# Patient Record
Sex: Female | Born: 1980
Health system: Southern US, Community
[De-identification: ages and names within clinical notes are randomized; demographics above are authoritative.]

## PROBLEM LIST (undated history)

## (undated) DIAGNOSIS — J189 Pneumonia, unspecified organism: Secondary | ICD-10-CM

## (undated) DIAGNOSIS — K219 Gastro-esophageal reflux disease without esophagitis: Secondary | ICD-10-CM

## (undated) DIAGNOSIS — Z0184 Encounter for antibody response examination: Secondary | ICD-10-CM

## (undated) DIAGNOSIS — O039 Complete or unspecified spontaneous abortion without complication: Secondary | ICD-10-CM

## (undated) DIAGNOSIS — R768 Other specified abnormal immunological findings in serum: Secondary | ICD-10-CM

## (undated) DIAGNOSIS — E249 Cushing's syndrome, unspecified: Secondary | ICD-10-CM

## (undated) DIAGNOSIS — R87629 Unspecified abnormal cytological findings in specimens from vagina: Secondary | ICD-10-CM

## (undated) DIAGNOSIS — O1490 Unspecified pre-eclampsia, unspecified trimester: Secondary | ICD-10-CM

## (undated) DIAGNOSIS — I10 Essential (primary) hypertension: Secondary | ICD-10-CM

## (undated) DIAGNOSIS — K5792 Diverticulitis of intestine, part unspecified, without perforation or abscess without bleeding: Secondary | ICD-10-CM

## (undated) DIAGNOSIS — E669 Obesity, unspecified: Secondary | ICD-10-CM

## (undated) DIAGNOSIS — I2699 Other pulmonary embolism without acute cor pulmonale: Secondary | ICD-10-CM

## (undated) HISTORY — DX: Obesity, unspecified: E66.9

## (undated) HISTORY — DX: Unspecified abnormal cytological findings in specimens from vagina: R87.629

## (undated) HISTORY — DX: Complete or unspecified spontaneous abortion without complication: O03.9

## (undated) HISTORY — DX: Essential (primary) hypertension: I10

## (undated) HISTORY — DX: Unspecified pre-eclampsia, unspecified trimester: O14.90

## (undated) HISTORY — PX: CHOLECYSTECTOMY: SHX55

## (undated) HISTORY — DX: Cushing's syndrome, unspecified: E24.9

## (undated) HISTORY — PX: WISDOM TOOTH EXTRACTION: SHX21

---
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
## 2002-12-27 DIAGNOSIS — Z9049 Acquired absence of other specified parts of digestive tract: Secondary | ICD-10-CM | POA: Insufficient documentation

## 2010-10-17 DIAGNOSIS — O1490 Unspecified pre-eclampsia, unspecified trimester: Secondary | ICD-10-CM | POA: Insufficient documentation

## 2014-08-09 ENCOUNTER — Emergency Department (HOSPITAL_COMMUNITY): Payer: Self-pay

## 2014-08-09 ENCOUNTER — Inpatient Hospital Stay (HOSPITAL_COMMUNITY)
Admission: EM | Admit: 2014-08-09 | Discharge: 2014-08-10 | DRG: 392 | Disposition: A | Discharge status: Home / Self Care | Payer: Self-pay | Attending: Internal Medicine | Admitting: Internal Medicine

## 2014-08-09 ENCOUNTER — Encounter (HOSPITAL_COMMUNITY): Payer: Self-pay | Admitting: *Deleted

## 2014-08-09 DIAGNOSIS — K5732 Diverticulitis of large intestine without perforation or abscess without bleeding: Principal | ICD-10-CM | POA: Diagnosis present

## 2014-08-09 DIAGNOSIS — R109 Unspecified abdominal pain: Secondary | ICD-10-CM | POA: Diagnosis present

## 2014-08-09 DIAGNOSIS — D3501 Benign neoplasm of right adrenal gland: Secondary | ICD-10-CM | POA: Diagnosis present

## 2014-08-09 DIAGNOSIS — R740 Nonspecific elevation of levels of transaminase and lactic acid dehydrogenase [LDH]: Secondary | ICD-10-CM | POA: Diagnosis present

## 2014-08-09 DIAGNOSIS — Z9049 Acquired absence of other specified parts of digestive tract: Secondary | ICD-10-CM | POA: Diagnosis present

## 2014-08-09 DIAGNOSIS — K5792 Diverticulitis of intestine, part unspecified, without perforation or abscess without bleeding: Secondary | ICD-10-CM | POA: Diagnosis present

## 2014-08-09 DIAGNOSIS — R7401 Elevation of levels of liver transaminase levels: Secondary | ICD-10-CM | POA: Diagnosis present

## 2014-08-09 LAB — COMPREHENSIVE METABOLIC PANEL
ALT: 70 U/L — AB (ref 0–35)
AST: 69 U/L — ABNORMAL HIGH (ref 0–37)
Albumin: 4.1 g/dL (ref 3.5–5.2)
Alkaline Phosphatase: 91 U/L (ref 39–117)
Anion gap: 7 (ref 5–15)
BUN: 12 mg/dL (ref 6–23)
CO2: 23 mmol/L (ref 19–32)
Calcium: 8.6 mg/dL (ref 8.4–10.5)
Chloride: 108 mmol/L (ref 96–112)
Creatinine, Ser: 0.75 mg/dL (ref 0.50–1.10)
GFR calc non Af Amer: >90 mL/min (ref >90)
GLUCOSE: 93 mg/dL (ref 70–99)
Potassium: 3.5 mmol/L (ref 3.5–5.1)
Sodium: 138 mmol/L (ref 135–145)
Total Bilirubin: 0.6 mg/dL (ref 0.3–1.2)
Total Protein: 7.8 g/dL (ref 6.0–8.3)

## 2014-08-09 LAB — CBC WITH DIFFERENTIAL/PLATELET
BASOS PCT: 0 % (ref 0–1)
Basophils Absolute: 0 10*3/uL (ref 0.0–0.1)
EOS ABS: 0.1 10*3/uL (ref 0.0–0.7)
Eosinophils Relative: 1 % (ref 0–5)
HEMATOCRIT: 39.7 % (ref 36.0–46.0)
HEMOGLOBIN: 13.1 g/dL (ref 12.0–15.0)
Lymphocytes Relative: 21 % (ref 12–46)
Lymphs Abs: 2.5 10*3/uL (ref 0.7–4.0)
MCH: 30 pg (ref 26.0–34.0)
MCHC: 33 g/dL (ref 30.0–36.0)
MCV: 91.1 fL (ref 78.0–100.0)
MONO ABS: 0.8 10*3/uL (ref 0.1–1.0)
MONOS PCT: 6 % (ref 3–12)
NEUTROS PCT: 72 % (ref 43–77)
Neutro Abs: 8.5 10*3/uL — ABNORMAL HIGH (ref 1.7–7.7)
Platelets: 284 10*3/uL (ref 150–400)
RBC: 4.36 MIL/uL (ref 3.87–5.11)
RDW: 13.5 % (ref 11.5–15.5)
WBC: 11.9 10*3/uL — ABNORMAL HIGH (ref 4.0–10.5)

## 2014-08-09 LAB — URINALYSIS, ROUTINE W REFLEX MICROSCOPIC
Bilirubin Urine: NEGATIVE
Glucose, UA: NEGATIVE mg/dL
Hgb urine dipstick: NEGATIVE
Ketones, ur: NEGATIVE mg/dL
Nitrite: NEGATIVE
PH: 5 (ref 5.0–8.0)
PROTEIN: NEGATIVE mg/dL
Specific Gravity, Urine: 1.022 (ref 1.005–1.030)
Urobilinogen, UA: 1 mg/dL (ref 0.0–1.0)

## 2014-08-09 LAB — URINE MICROSCOPIC-ADD ON

## 2014-08-09 LAB — PREGNANCY, URINE: Preg Test, Ur: NEGATIVE

## 2014-08-09 LAB — LACTIC ACID, PLASMA: Lactic Acid, Venous: 2.6 mmol/L (ref 0.5–2.0)

## 2014-08-09 LAB — LIPASE, BLOOD: Lipase: 23 U/L (ref 11–59)

## 2014-08-09 LAB — PROTIME-INR
INR: 1.07 (ref 0.00–1.49)
PROTHROMBIN TIME: 14 s (ref 11.6–15.2)

## 2014-08-09 IMAGING — CT CT ABD-PELV W/ CM
1 of 2 series · 15 of 32 positions shown, 19 images · IV contrast (OMNIPAQUE 300)
Comparison: None.

CLINICAL DATA: Patient with intermittent left lower quadrant pain.

EXAM:
CT ABDOMEN AND PELVIS WITH CONTRAST
TECHNIQUE: Multidetector CT imaging of the abdomen and pelvis was performed
using the standard protocol following bolus administration of
intravenous contrast.
CONTRAST:  50mL OMNIPAQUE IOHEXOL 300 MG/ML SOLN, 100mL OMNIPAQUE
IOHEXOL 300 MG/ML SOLN

[Series 2: abd/pel with · axial · 0.80mm/px · z∈[+1054,+1508]mm · 15 of 99 slices shown, 19 images]
[im 4/99  soft-tissue]
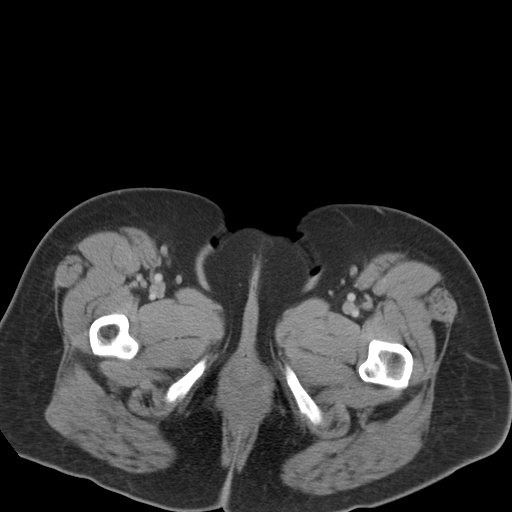
[im 4/99  bone]
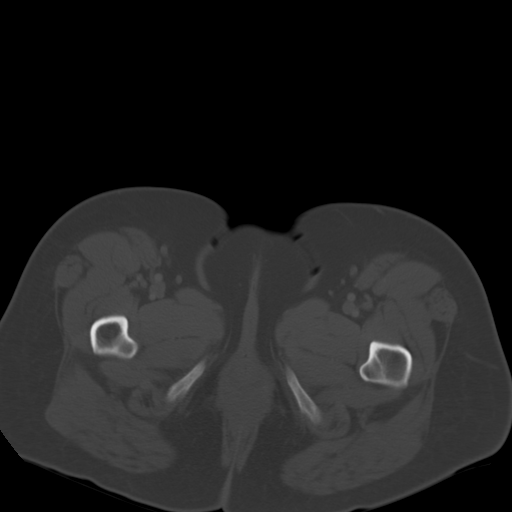
[im 12/99  soft-tissue]
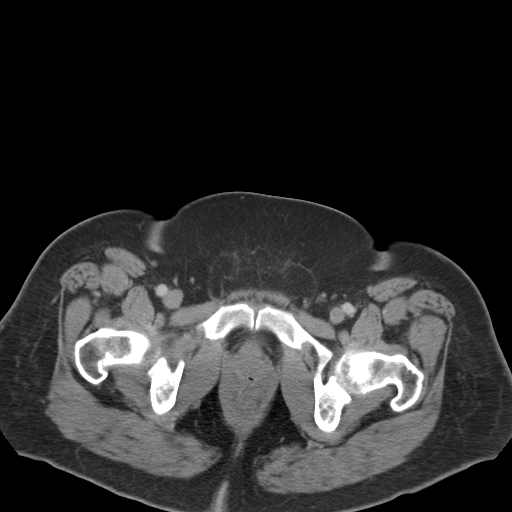
[im 20/99  soft-tissue]
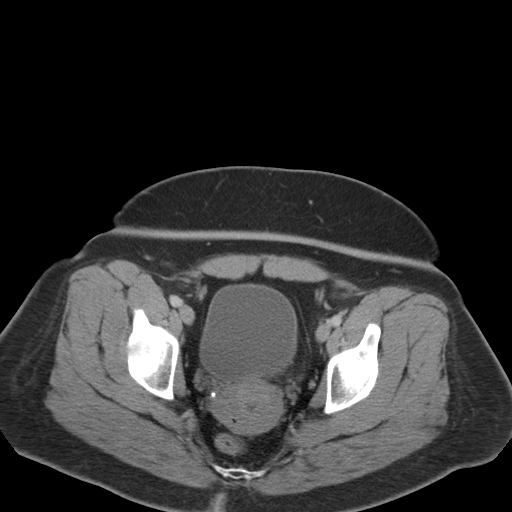
[im 28/99  soft-tissue]
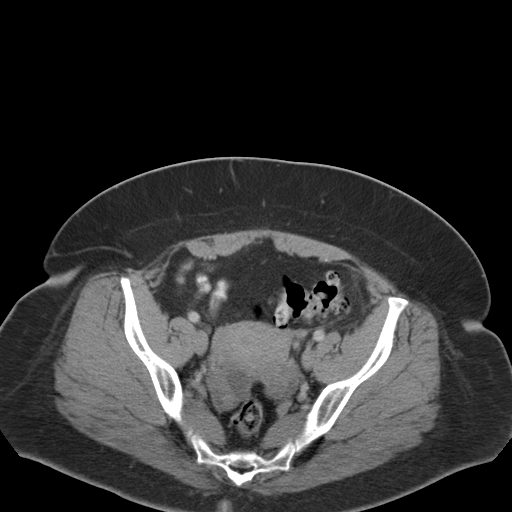
[im 36/99  soft-tissue]
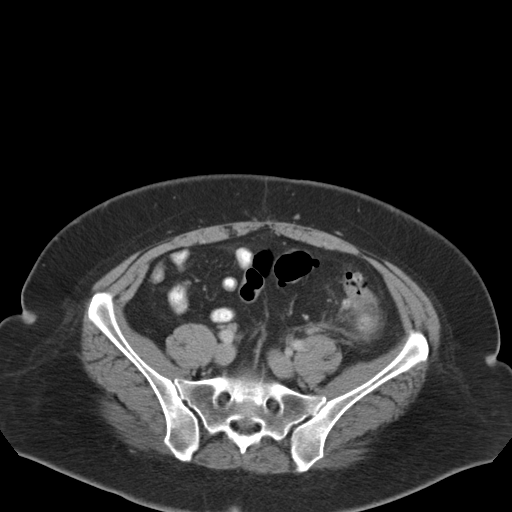
[im 44/99  soft-tissue]
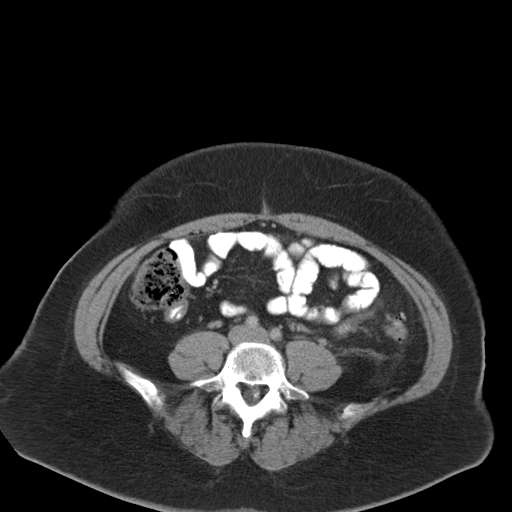
[im 51/99  soft-tissue]
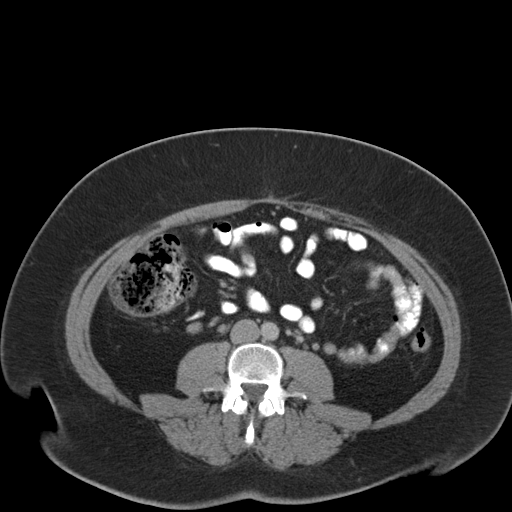
[im 55/99  soft-tissue]
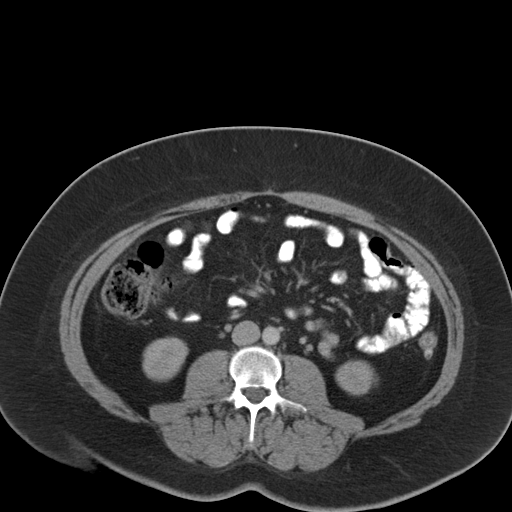
[im 63/99  soft-tissue]
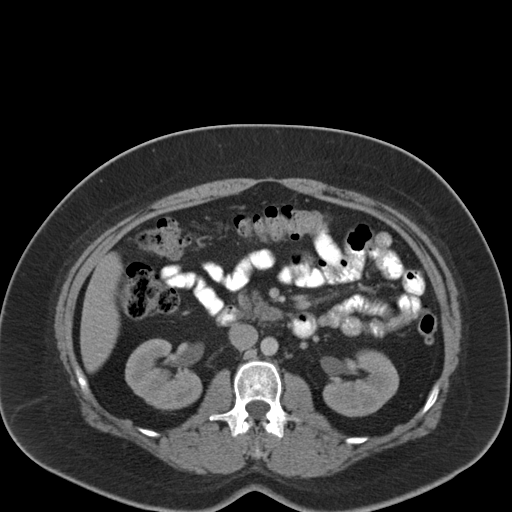
[im 63/99  bone]
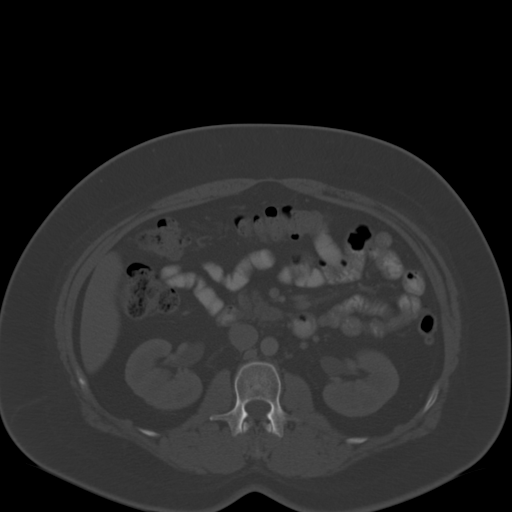
[im 71/99  soft-tissue]
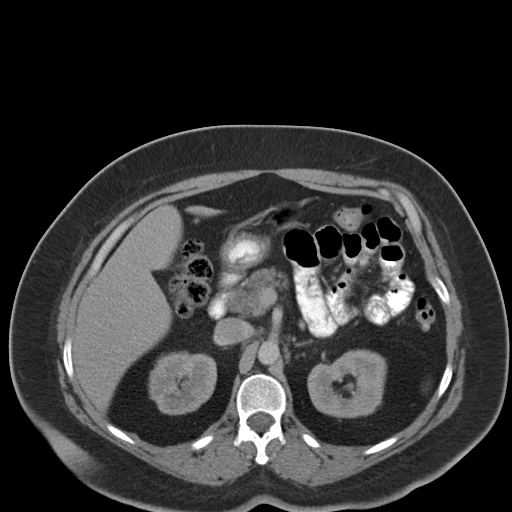
[im 79/99  soft-tissue]
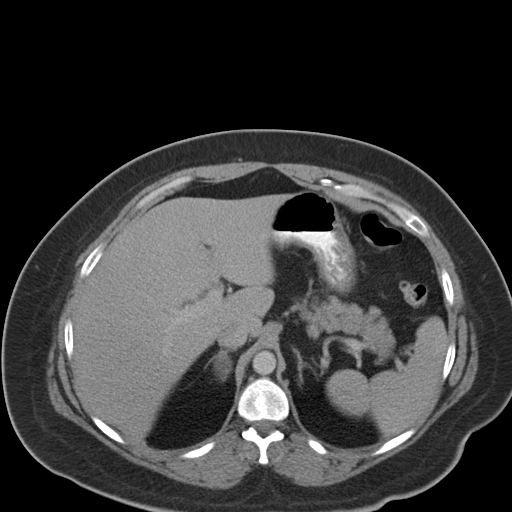
[im 83/99  lung]
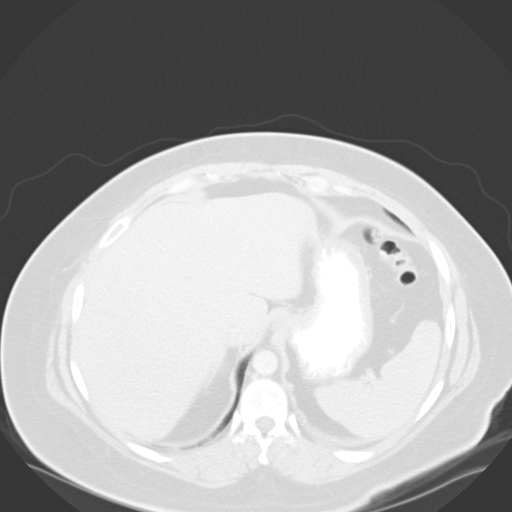
[im 87/99  soft-tissue]
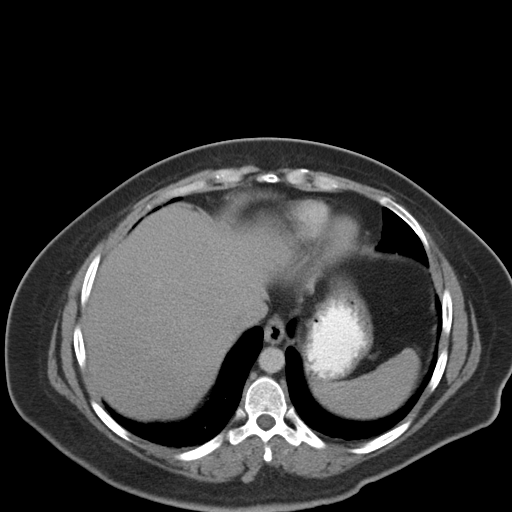
[im 87/99  lung]
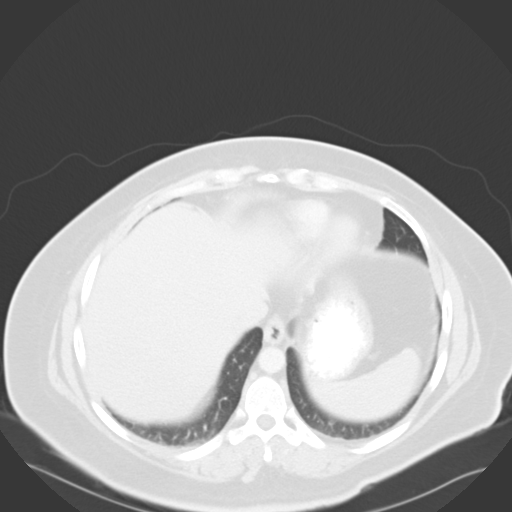
[im 91/99  lung]
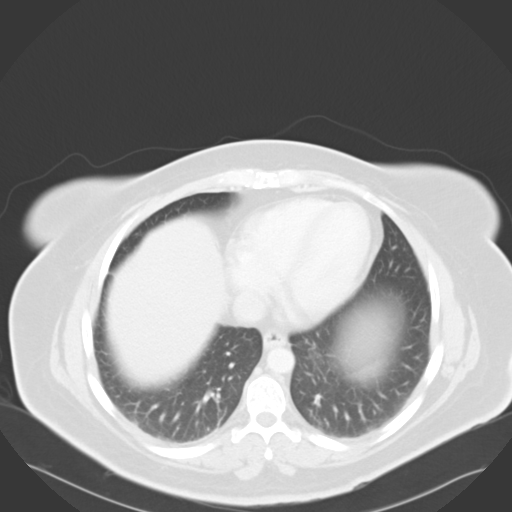
[im 95/99  soft-tissue]
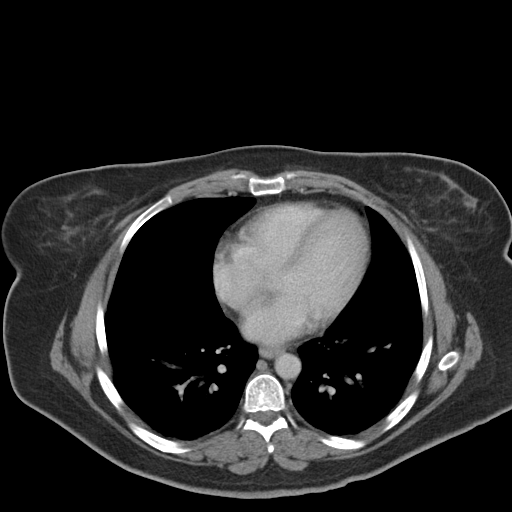
[im 95/99  lung]
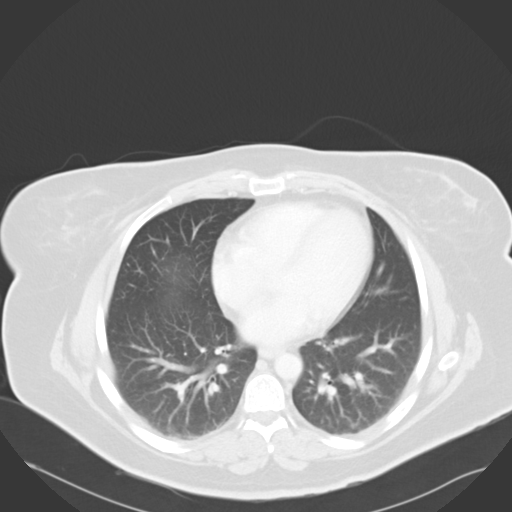

[15 of 32 positions shown; findings below may reference images not displayed]

FINDINGS: Lower chest: Dependent atelectasis within bilateral lower lobes
normal heart size. No pericardial effusion.

Hepatobiliary: The liver is normal in size and contour. Status post
cholecystectomy. No intrahepatic or extrahepatic biliary ductal
dilatation.

Pancreas: Unremarkable

Spleen: Unremarkable

Adrenals/Urinary Tract: 2.7 cm low-density mass within the right
adrenal gland with an internal density of -1 Hounsfield units (image
19; series 2), compatible with adrenal adenoma. Kidneys enhance
symmetrically with contrast. No hydronephrosis. Urinary bladder is
unremarkable.

Stomach/Bowel: Sigmoid colonic diverticulosis. There is focal wall
thickening and inflammatory change involving the descending colon
(image 62; series 2), compatible with acute diverticulitis. The
appendix is normal. No small bowel obstruction.

Vascular/Lymphatic: Normal caliber abdominal aorta. No
retroperitoneal lymphadenopathy.

Other: Uterus and adnexal structures are unremarkable.

Musculoskeletal: Lower lumbar spine degenerative changes. No
aggressive or acute appearing osseous lesions.
IMPRESSION: Findings compatible with acute descending/sigmoid colonic
diverticulitis. Consider correlation with colonoscopy upon
resolution of the acute symptomatology.

Right adrenal adenoma.

## 2014-08-09 MED ORDER — IOHEXOL 300 MG/ML  SOLN
100.0000 mL | Freq: Once | INTRAMUSCULAR | Status: AC | PRN
  Administered 2014-08-09: 100 mL via INTRAVENOUS

## 2014-08-09 MED ORDER — SODIUM CHLORIDE 0.9 % IV BOLUS (SEPSIS)
1000.0000 mL | Freq: Once | INTRAVENOUS | Status: AC
  Administered 2014-08-09: 1000 mL via INTRAVENOUS

## 2014-08-09 MED ORDER — CIPROFLOXACIN IN D5W 400 MG/200ML IV SOLN
400.0000 mg | Freq: Once | INTRAVENOUS | Status: AC
  Administered 2014-08-09: 400 mg via INTRAVENOUS
  Filled 2014-08-09: qty 200

## 2014-08-09 MED ORDER — PIPERACILLIN-TAZOBACTAM 3.375 G IVPB
3.3750 g | Freq: Three times a day (TID) | INTRAVENOUS | Status: DC
  Administered 2014-08-09 – 2014-08-10 (×2): 3.375 g via INTRAVENOUS
  Filled 2014-08-09 (×3): qty 50

## 2014-08-09 MED ORDER — HEPARIN SODIUM (PORCINE) 5000 UNIT/ML IJ SOLN
5000.0000 [IU] | Freq: Three times a day (TID) | INTRAMUSCULAR | Status: DC
  Administered 2014-08-10: 5000 [IU] via SUBCUTANEOUS
  Filled 2014-08-09 (×4): qty 1

## 2014-08-09 MED ORDER — SODIUM CHLORIDE 0.9 % IV SOLN
1000.0000 mL | Freq: Once | INTRAVENOUS | Status: AC
  Administered 2014-08-09: 1000 mL via INTRAVENOUS

## 2014-08-09 MED ORDER — ONDANSETRON HCL 4 MG/2ML IJ SOLN
4.0000 mg | Freq: Three times a day (TID) | INTRAMUSCULAR | Status: DC | PRN
  Administered 2014-08-10: 4 mg via INTRAVENOUS
  Filled 2014-08-09: qty 2

## 2014-08-09 MED ORDER — KETOROLAC TROMETHAMINE 30 MG/ML IJ SOLN
30.0000 mg | Freq: Once | INTRAMUSCULAR | Status: AC
  Administered 2014-08-09: 30 mg via INTRAVENOUS
  Filled 2014-08-09: qty 1

## 2014-08-09 MED ORDER — ONDANSETRON HCL 4 MG/2ML IJ SOLN
4.0000 mg | Freq: Once | INTRAMUSCULAR | Status: AC
  Administered 2014-08-09: 4 mg via INTRAVENOUS
  Filled 2014-08-09: qty 2

## 2014-08-09 MED ORDER — IOHEXOL 300 MG/ML  SOLN
50.0000 mL | Freq: Once | INTRAMUSCULAR | Status: AC | PRN
  Administered 2014-08-09: 50 mL via ORAL

## 2014-08-09 MED ORDER — MORPHINE SULFATE 4 MG/ML IJ SOLN
4.0000 mg | Freq: Once | INTRAMUSCULAR | Status: AC
  Administered 2014-08-09: 4 mg via INTRAVENOUS
  Filled 2014-08-09: qty 1

## 2014-08-09 MED ORDER — PROMETHAZINE HCL 25 MG/ML IJ SOLN
25.0000 mg | Freq: Once | INTRAMUSCULAR | Status: AC
  Administered 2014-08-09: 25 mg via INTRAVENOUS
  Filled 2014-08-09: qty 1

## 2014-08-09 MED ORDER — PHENTERMINE HCL 37.5 MG PO CAPS: 37.5000 mg | ORAL_CAPSULE | ORAL | Status: DC

## 2014-08-09 MED ORDER — SODIUM CHLORIDE 0.9 % IV SOLN
INTRAVENOUS | Status: DC
  Administered 2014-08-09: 22:00:00 via INTRAVENOUS

## 2014-08-09 MED ORDER — MORPHINE SULFATE 2 MG/ML IJ SOLN: 2.0000 mg | INTRAMUSCULAR | Status: DC | PRN

## 2014-08-09 NOTE — H&P (Signed)
[facility] History and Physical  Paula King FIE:[id number] DOB: Nov 09, 1980 DOA: 08/09/2014  Referring physician: ED physician PCP: No primary care provider on file.  Specialists:   Chief Complaint: Abdominal pain  HPI: Paula King is a 34 y.o. female without significant past medical history, who presents with abdominal pain.  Patient reports that she started having abdominal pain 5 days ago. The pain is located in the left lower quadrant, constant, 8 out of 10 in severity, sharp, radiating to the left groin area. It is aggravated by movement. It is not alleviated by any known factors. Patient does not have fever, but has chills. Patient has some nausea, but has no vomiting or diarrhea.  Patient does not have vaginal discharge. Her menstrual periods was regular. Last menstrual period was on 07/28/14. Patient dose not have headaches, cough, chest pain, SOB, dysuria, urgency, frequency, hematuria, skin rashes, joint pain or leg swelling. No unilateral weakness, numbness or tingling sensations. No vision change or hearing loss.  In ED, patient was found to have negative lipase, negative urinalysis, elevated transaminase with AST 69, ALT 70 and normal total bilirubin. Normal temperature. Leukocytosis with WBC 11.9. CT abdomen-pelvis showed diverticulitis in descending and sigmoid colon. She was accidentally found to have right adrenal adenoma by CT scan. Patient is admitted to inpatient for further evaluation and treatment.  Of note, patient was started with IV Cipro, but she started sweating and became clammy. IV ciprofloxacin was then discontinued. Patient reports that she took oral ciprofloxacin in the past without any problem.  Review of Systems: As presented in the history of presenting illness, rest negative.  Where does patient live?  At home Can patient participate in ADLs? Yes  Allergy: No Known Allergies  History reviewed. No pertinent past medical history.  Past Surgical  History  Procedure Laterality Date  . Cholecystectomy    . Cesarean section      Social History:  reports that she has never smoked. She does not have any smokeless tobacco history on file. She reports that she does not drink alcohol or use illicit drugs.  Family History: History reviewed. No pertinent family history.   Prior to Admission medications   Medication Sig Start Date End Date Taking? Authorizing Provider  bismuth subsalicylate (PEPTO BISMOL) 262 MG/15ML suspension Take 30 mLs by mouth every 6 (six) hours as needed for indigestion.   Yes Historical Provider, MD  ibuprofen (ADVIL,MOTRIN) 200 MG tablet Take 800 mg by mouth every 6 (six) hours as needed for moderate pain.   Yes Historical Provider, MD  loratadine (CLARITIN) 10 MG tablet Take 10 mg by mouth daily.   Yes Historical Provider, MD  phentermine 37.5 MG capsule Take 37.5 mg by mouth every morning.   Yes Historical Provider, MD    Physical Exam: Filed Vitals:   08/09/14 1318 08/09/14 1806 08/09/14 2021  BP: 126/91 134/95 123/87  Pulse: 128 87 80  Temp: 98.4 F (36.9 C) 98.2 F (36.8 C)   TempSrc: Oral Oral   Resp:  17 18  SpO2: 98% 100% 100%   General: Not in acute distress HEENT:       Eyes: PERRL, EOMI, no scleral icterus       ENT: No discharge from the ears and nose, no pharynx injection, no tonsillar enlargement.        Neck: No JVD, no bruit, no mass felt. Cardiac: S1/S2, RRR, No murmurs, No gallops or rubs Pulm: Good air movement bilaterally. Clear to auscultation bilaterally. No rales, wheezing,  rhonchi or rubs. Abd: Soft, nondistended, tenderness over LLQ, no rebound pain, no organomegaly, BS present Ext: No edema bilaterally. 2+DP/PT pulse bilaterally Musculoskeletal: No joint deformities, erythema, or stiffness, ROM full Skin: No rashes.  Neuro: Alert and oriented X3, cranial nerves II-XII grossly intact, muscle strength 5/5 in all extremeties, sensation to light touch intact.  Psych: Patient is  not psychotic, no suicidal or hemocidal ideation.  Labs on Admission:  Basic Metabolic Panel:  Recent Labs Lab 08/09/14 1421  NA 138  K 3.5  CL 108  CO2 23  GLUCOSE 93  BUN 12  CREATININE 0.75  CALCIUM 8.6   Liver Function Tests:  Recent Labs Lab 08/09/14 1421  AST 69*  ALT 70*  ALKPHOS 91  BILITOT 0.6  PROT 7.8  ALBUMIN 4.1    Recent Labs Lab 08/09/14 1421  LIPASE 23   No results for input(s): AMMONIA in the last 168 hours. CBC:  Recent Labs Lab 08/09/14 1421  WBC 11.9*  NEUTROABS 8.5*  HGB 13.1  HCT 39.7  MCV 91.1  PLT 284   Cardiac Enzymes: No results for input(s): CKTOTAL, CKMB, CKMBINDEX, TROPONINI in the last 168 hours.  BNP (last 3 results) No results for input(s): BNP in the last 8760 hours.  ProBNP (last 3 results) No results for input(s): PROBNP in the last 8760 hours.  CBG: No results for input(s): GLUCAP in the last 168 hours.  Radiological Exams on Admission: Ct Abdomen Pelvis W Contrast  08/09/2014   CLINICAL DATA:  Patient with intermittent left lower quadrant pain.  EXAM: CT ABDOMEN AND PELVIS WITH CONTRAST  TECHNIQUE: Multidetector CT imaging of the abdomen and pelvis was performed using the standard protocol following bolus administration of intravenous contrast.  CONTRAST:  64mL OMNIPAQUE IOHEXOL 300 MG/ML SOLN, 149mL OMNIPAQUE IOHEXOL 300 MG/ML SOLN  COMPARISON:  None.  FINDINGS: Lower chest: Dependent atelectasis within bilateral lower lobes normal heart size. No pericardial effusion.  Hepatobiliary: The liver is normal in size and contour. Status post cholecystectomy. No intrahepatic or extrahepatic biliary ductal dilatation.  Pancreas: Unremarkable  Spleen: Unremarkable  Adrenals/Urinary Tract: 2.7 cm low-density mass within the right adrenal gland with an internal density of -1 Hounsfield units (image 19; series 2), compatible with adrenal adenoma. Kidneys enhance symmetrically with contrast. No hydronephrosis. Urinary bladder is  unremarkable.  Stomach/Bowel: Sigmoid colonic diverticulosis. There is focal wall thickening and inflammatory change involving the descending colon (image 62; series 2), compatible with acute diverticulitis. The appendix is normal. No small bowel obstruction.  Vascular/Lymphatic: Normal caliber abdominal aorta. No retroperitoneal lymphadenopathy.  Other: Uterus and adnexal structures are unremarkable.  Musculoskeletal: Lower lumbar spine degenerative changes. No aggressive or acute appearing osseous lesions.  IMPRESSION: Findings compatible with acute descending/sigmoid colonic diverticulitis. Consider correlation with colonoscopy upon resolution of the acute symptomatology.  Right adrenal adenoma.   Electronically Signed   By: Lovey Newcomer M.D.   On: 08/09/2014 17:48    Assessment/Plan Principal Problem:   Diverticulitis of colon Active Problems:   Abdominal pain   Adrenal adenoma   Diverticulitis   Elevated transaminase level  Diverticulitis of colon: As evidenced by CT scan. No abscess or bleeding. Patient is hemodynamically stable, not obviously septic. Since patient has severe nausea, and cannot tolerate oral medications, will be admitted to inpatient for IV antibiotics treatment. -will admit to med-surg -start IV zosyn -follow up blood culture -HIV ab - symptomatic control: Zofran for nausea, morphine for abdominal pain -Check lactic acid level -IVF: NS 125cc/h  Mildly Elevated transaminase level: Etiology is not clear. Likely due to severe nausea. -check hepatitis panel  Accidental adrenal adenoma: -Follow-up with PCP   DVT ppx: SQ Heparin  Code Status: Full code Family Communication: None at bed side.            Disposition Plan: Admit to inpatient   Date of Service 08/09/2014    Ivor Costa [facility] Pager [phone]  If 7PM-7AM, please contact night-coverage [email or web address] Password [facility] 08/09/2014, 8:49 PM

## 2014-08-09 NOTE — ED Notes (Signed)
Patient states that since Monday she has been having sharp intermittent pain to her LLQ. She states it is tender to touch. Denies diarrhea and emesis, but has had some nausea. Pain has been unrelieved with ibuprofen and pepto bismol.

## 2014-08-09 NOTE — Progress Notes (Signed)
ANTIBIOTIC CONSULT NOTE - INITIAL  Pharmacy Consult for Zosyn Indication: intra-abdominal infection  No Known Allergies  Patient Measurements: Height: 5\' 4"  (162.6 cm) Weight: 227 lb 1.6 oz (103.012 kg) IBW/kg (Calculated) : 54.7 Adjusted Body Weight: 74kg  Vital Signs: Temp: 97.6 F (36.4 C) (02/12 2125) Temp Source: Oral (02/12 2125) BP: 123/66 mmHg (02/12 2125) Pulse Rate: 74 (02/12 2125)  Labs:  Recent Labs  08/09/14 1421  WBC 11.9*  HGB 13.1  PLT 284  CREATININE 0.75   Estimated Creatinine Clearance: 116.8 mL/min (by C-G formula based on Cr of 0.75).   Assessment: 57 yoF admitted 2/12 with LLQ abdominal pain x5 days PTA. Transaminases are slightly elevated on admission. CT abdomen-pelvis showed diverticulitis in descending and sigmoid colon. Patient originally started on ciprofloxacin per MD but patient experienced diaphoresis and clamminess with IV ciprofloxacin administration. Pharmacy is now consulted to change antibiotics to Zosyn for intra-abdominal infection.  Antiinfectives  2/12 >> ciprofloxacin x1 2/12 >> Zosyn >>    Labs / vitals Tmax: afebrile WBCs: slightly elevated to 11.9 Renal: SCr 0.75 (baseline unknown), Crcl > 100 ml/min CG (obese), >100N  Microbiology 2/12 blood x2: ordered 2/12 urine: IP  2/12 acute hepatitis panel: ordered 2/12 HIV Ab: ordered   Goal of Therapy:  Zosyn per indication and renal function  Plan:  - start Zosyn 3.375G IV q8h, each dose to be infused over 4 hours - follow-up clinical course, culture results, renal function - follow-up antibiotic de-escalation and length of therapy  Thank you for the consult.  Currie Paris, PharmD, BCPS Pager: [phone] Pharmacy: [phone] 08/09/2014 9:56 PM

## 2014-08-09 NOTE — ED Provider Notes (Signed)
CSN: [id number]     Arrival date & time 08/09/14  1317 History   First MD Initiated Contact with Patient 08/09/14 1501     Chief Complaint  Patient presents with  . Abdominal Pain     (Consider location/radiation/quality/duration/timing/severity/associated sxs/prior Treatment) HPI Patient presents to the emergency department with left-sided lower abdominal pain that started 5 days ago.  The patient states that she has had worsening abdominal pain over that time.  The patient states that movement and palpation make the pain worse.  She states that ibuprofen helped somewhat with the pain, but did not totally relieve her symptoms.  Patient states that she has never had any ovarian cyst in the past.  The patient denies chest pain, shortness of breath, dysuria, incontinence, weakness, dizziness, headache, blurred vision, fever, vaginal bleeding, vaginal discharge, diarrhea, vomiting or syncope.  The patient states that she has had some nausea associated with the pain. History reviewed. No pertinent past medical history. Past Surgical History  Procedure Laterality Date  . Cholecystectomy    . Cesarean section     History reviewed. No pertinent family history. History  Substance Use Topics  . Smoking status: Never Smoker   . Smokeless tobacco: Not on file  . Alcohol Use: No   OB History    No data available     Review of Systems  All other systems negative except as documented in the HPI. All pertinent positives and negatives as reviewed in the HPI.  Allergies  Review of patient's allergies indicates no known allergies.  Home Medications   Prior to Admission medications   Medication Sig Start Date End Date Taking? Authorizing Provider  bismuth subsalicylate (PEPTO BISMOL) 262 MG/15ML suspension Take 30 mLs by mouth every 6 (six) hours as needed for indigestion.   Yes Historical Provider, MD  ibuprofen (ADVIL,MOTRIN) 200 MG tablet Take 800 mg by mouth every 6 (six) hours as needed  for moderate pain.   Yes Historical Provider, MD  loratadine (CLARITIN) 10 MG tablet Take 10 mg by mouth daily.   Yes Historical Provider, MD  phentermine 37.5 MG capsule Take 37.5 mg by mouth every morning.   Yes Historical Provider, MD   BP 126/91 mmHg  Pulse 128  Temp(Src) 98.4 F (36.9 C) (Oral)  SpO2 98%  LMP 07/28/2014 Physical Exam  Constitutional: She is oriented to person, place, and time. She appears well-developed and well-nourished. No distress.  HENT:  Head: Normocephalic and atraumatic.  Mouth/Throat: Oropharynx is clear and moist.  Eyes: Pupils are equal, round, and reactive to light.  Neck: Normal range of motion. Neck supple.  Cardiovascular: Normal rate, regular rhythm and normal heart sounds.  Exam reveals no gallop and no friction rub.   No murmur heard. Pulmonary/Chest: Effort normal and breath sounds normal. No respiratory distress.  Abdominal: Soft. Normal appearance and bowel sounds are normal. She exhibits no distension. There is tenderness. There is no rigidity, no rebound and no guarding.    Musculoskeletal: She exhibits no edema.  Neurological: She is alert and oriented to person, place, and time. She exhibits normal muscle tone. Coordination normal.  Skin: Skin is warm and dry. No rash noted. No erythema.  Nursing note and vitals reviewed.   ED Course  Procedures (including critical care time) Labs Review Labs Reviewed  CBC WITH DIFFERENTIAL/PLATELET - Abnormal; Notable for the following:    WBC 11.9 (*)    Neutro Abs 8.5 (*)    All other components within normal limits  COMPREHENSIVE  METABOLIC PANEL - Abnormal; Notable for the following:    AST 69 (*)    ALT 70 (*)    All other components within normal limits  URINALYSIS, ROUTINE W REFLEX MICROSCOPIC - Abnormal; Notable for the following:    APPearance CLOUDY (*)    Leukocytes, UA SMALL (*)    All other components within normal limits  LIPASE, BLOOD  PREGNANCY, URINE  URINE MICROSCOPIC-ADD  ON    Imaging Review Ct Abdomen Pelvis W Contrast  08/09/2014   CLINICAL DATA:  Patient with intermittent left lower quadrant pain.  EXAM: CT ABDOMEN AND PELVIS WITH CONTRAST  TECHNIQUE: Multidetector CT imaging of the abdomen and pelvis was performed using the standard protocol following bolus administration of intravenous contrast.  CONTRAST:  61mL OMNIPAQUE IOHEXOL 300 MG/ML SOLN, 139mL OMNIPAQUE IOHEXOL 300 MG/ML SOLN  COMPARISON:  None.  FINDINGS: Lower chest: Dependent atelectasis within bilateral lower lobes normal heart size. No pericardial effusion.  Hepatobiliary: The liver is normal in size and contour. Status post cholecystectomy. No intrahepatic or extrahepatic biliary ductal dilatation.  Pancreas: Unremarkable  Spleen: Unremarkable  Adrenals/Urinary Tract: 2.7 cm low-density mass within the right adrenal gland with an internal density of -1 Hounsfield units (image 19; series 2), compatible with adrenal adenoma. Kidneys enhance symmetrically with contrast. No hydronephrosis. Urinary bladder is unremarkable.  Stomach/Bowel: Sigmoid colonic diverticulosis. There is focal wall thickening and inflammatory change involving the descending colon (image 62; series 2), compatible with acute diverticulitis. The appendix is normal. No small bowel obstruction.  Vascular/Lymphatic: Normal caliber abdominal aorta. No retroperitoneal lymphadenopathy.  Other: Uterus and adnexal structures are unremarkable.  Musculoskeletal: Lower lumbar spine degenerative changes. No aggressive or acute appearing osseous lesions.  IMPRESSION: Findings compatible with acute descending/sigmoid colonic diverticulitis. Consider correlation with colonoscopy upon resolution of the acute symptomatology.  Right adrenal adenoma.   Electronically Signed   By: Lovey Newcomer M.D.   On: 08/09/2014 17:48    Patient needed admission to the hospital because she is feeling worsening pain with nausea and diaphoresis.  The patient otherwise is been  stable.  I spoke with the [facility] who will admit her for further evaluation MDM   Final diagnoses:  None       Brent General, PA-C 08/10/14 2212  Leota Jacobsen, MD 08/11/14 [phone]

## 2014-08-10 DIAGNOSIS — R740 Nonspecific elevation of levels of transaminase and lactic acid dehydrogenase [LDH]: Secondary | ICD-10-CM

## 2014-08-10 DIAGNOSIS — D3501 Benign neoplasm of right adrenal gland: Secondary | ICD-10-CM

## 2014-08-10 DIAGNOSIS — R1032 Left lower quadrant pain: Secondary | ICD-10-CM

## 2014-08-10 DIAGNOSIS — K5732 Diverticulitis of large intestine without perforation or abscess without bleeding: Principal | ICD-10-CM

## 2014-08-10 LAB — CBC
HCT: 34.9 % — ABNORMAL LOW (ref 36.0–46.0)
Hemoglobin: 11.5 g/dL — ABNORMAL LOW (ref 12.0–15.0)
MCH: 29.8 pg (ref 26.0–34.0)
MCHC: 33 g/dL (ref 30.0–36.0)
MCV: 90.4 fL (ref 78.0–100.0)
Platelets: 253 10*3/uL (ref 150–400)
RBC: 3.86 MIL/uL — ABNORMAL LOW (ref 3.87–5.11)
RDW: 13.5 % (ref 11.5–15.5)
WBC: 10.4 10*3/uL (ref 4.0–10.5)

## 2014-08-10 LAB — COMPREHENSIVE METABOLIC PANEL
ALK PHOS: 77 U/L (ref 39–117)
ALT: 55 U/L — ABNORMAL HIGH (ref 0–35)
ANION GAP: 4 — AB (ref 5–15)
AST: 49 U/L — ABNORMAL HIGH (ref 0–37)
Albumin: 3.1 g/dL — ABNORMAL LOW (ref 3.5–5.2)
BUN: 10 mg/dL (ref 6–23)
CHLORIDE: 109 mmol/L (ref 96–112)
CO2: 26 mmol/L (ref 19–32)
CREATININE: 0.73 mg/dL (ref 0.50–1.10)
Calcium: 8 mg/dL — ABNORMAL LOW (ref 8.4–10.5)
GLUCOSE: 132 mg/dL — AB (ref 70–99)
POTASSIUM: 3.9 mmol/L (ref 3.5–5.1)
Sodium: 139 mmol/L (ref 135–145)
Total Bilirubin: 0.6 mg/dL (ref 0.3–1.2)
Total Protein: 6.2 g/dL (ref 6.0–8.3)

## 2014-08-10 LAB — GLUCOSE, CAPILLARY: GLUCOSE-CAPILLARY: 111 mg/dL — AB (ref 70–99)

## 2014-08-10 LAB — LACTIC ACID, PLASMA: LACTIC ACID, VENOUS: 1.1 mmol/L (ref 0.5–2.0)

## 2014-08-10 MED ORDER — METRONIDAZOLE 500 MG PO TABS: 500.0000 mg | ORAL_TABLET | Freq: Three times a day (TID) | ORAL | Status: DC

## 2014-08-10 MED ORDER — ONDANSETRON 4 MG PO TBDP: 4.0000 mg | ORAL_TABLET | Freq: Three times a day (TID) | ORAL | Status: DC | PRN

## 2014-08-10 MED ORDER — KETOROLAC TROMETHAMINE 30 MG/ML IJ SOLN
30.0000 mg | Freq: Once | INTRAMUSCULAR | Status: AC
  Administered 2014-08-10: 30 mg via INTRAVENOUS
  Filled 2014-08-10: qty 1

## 2014-08-10 MED ORDER — HYDROCODONE-ACETAMINOPHEN 5-500 MG PO TABS: 1.0000 | ORAL_TABLET | Freq: Four times a day (QID) | ORAL | Status: DC | PRN

## 2014-08-10 MED ORDER — CIPROFLOXACIN HCL 500 MG PO TABS: 500.0000 mg | ORAL_TABLET | Freq: Two times a day (BID) | ORAL | Status: DC

## 2014-08-10 NOTE — Discharge Instructions (Signed)
Diverticulitis °Diverticulitis is when small pockets that have formed in your colon (large intestine) become infected or swollen. °HOME CARE °· Follow your doctor's instructions. °· Follow a special diet if told by your doctor. °· When you feel better, your doctor may tell you to change your diet. You may be told to eat a lot of fiber. Fruits and vegetables are good sources of fiber. Fiber makes it easier to poop (have bowel movements). °· Take supplements or probiotics as told by your doctor. °· Only take medicines as told by your doctor. °· Keep all follow-up visits with your doctor. °GET HELP IF: °· Your pain does not get better. °· You have a hard time eating food. °· You are not pooping like normal. °GET HELP RIGHT AWAY IF: °· Your pain gets worse. °· Your problems do not get better. °· Your problems suddenly get worse. °· You have a fever. °· You keep throwing up (vomiting). °· You have bloody or black, tarry poop (stool). °MAKE SURE YOU:  °· Understand these instructions. °· Will watch your condition. °· Will get help right away if you are not doing well or get worse. °Document Released: 12/01/2007 Document Revised: 06/19/2013 Document Reviewed: 05/09/2013 °[employer]® Patient Information ©2015 [employer]. This information is not intended to replace advice given to you by your health care provider. Make sure you discuss any questions you have with your health care provider. ° °

## 2014-08-10 NOTE — Discharge Summary (Signed)
Physician Discharge Summary  Paula King IWL:[id number] DOB: 14-Mar-1981 DOA: 08/09/2014  PCP: Paula Serve, PA  Admit date: 08/09/2014 Discharge date: 08/10/2014   Recommendations for Outpatient Follow-Up:   1. F/U with PCP for non-resolution of symptoms. 2. PCP: Please F/U on outstanding test results including HIV serology, Acute hepatitis panel and blood cultures.   Discharge Diagnosis:   Principal Problem:    Diverticulitis of colon Active Problems:    Abdominal pain    Adrenal adenoma    Diverticulitis    Elevated transaminase level   Discharge Condition: Improved.  Diet recommendation: Regular.   History of Present Illness:   Paula King is an 34 y.o. female with no significant PMH who was admitted 08/09/14 with chief complaint of a 5 day history of left lower quadrant abdominal pain. In ED, patient was found to have negative lipase, negative urinalysis, elevated transaminase with AST 69, ALT 70 and normal total bilirubin. Normal temperature. Leukocytosis with WBC 11.9. CT abdomen-pelvis showed diverticulitis in descending and sigmoid colon. She was incidentally found to have right adrenal adenoma by CT scan. Patient is admitted to inpatient for further evaluation and treatment.   Hospital Course by Problem:   Principal Problem:  Diverticulitis of colon / abdominal pain / elevated transaminase level  Initially treated with Cipro/Flagyl but had a possible adverse reaction to IV Cipro, so she was placed on Zosyn.  Blood cultures pending.  Follow-up HIV, acute hepatitis panel.  Tolerated advancement of diet prior to D/C home.  D/C on Cipro/Flagyl x 10 days.  Note: Has taken PO Cipro without difficulty in the past.  Active Problems:  Adrenal adenoma  Incidentally discovered. No further inpatient evaluation needed at this time.  Counseled about need to have a F/U CT in 6-12 months to ensure stability of this finding.   Medical Consultants:     None.   Discharge Exam:   Filed Vitals:   08/10/14 0625  BP: 111/68  Pulse: 79  Temp: 98.3 F (36.8 C)  Resp: 18   Filed Vitals:   08/09/14 1806 08/09/14 2021 08/09/14 2125 08/10/14 0625  BP: 134/95 123/87 123/66 111/68  Pulse: 87 80 74 79  Temp: 98.2 F (36.8 C)  97.6 F (36.4 C) 98.3 F (36.8 C)  TempSrc: Oral  Oral Oral  Resp: 17 18 18 18   Height:   5\' 4"  (1.626 m)   Weight:   103.012 kg (227 lb 1.6 oz)   SpO2: 100% 100% 99% 99%    Gen:  NAD Cardiovascular:  RRR, No M/R/G Respiratory: Lungs CTAB Gastrointestinal: Abdomen soft, NT/ND with normal active bowel sounds. Extremities: No C/E/C   The results of significant diagnostics from this hospitalization (including imaging, microbiology, ancillary and laboratory) are listed below for reference.     Procedures and Diagnostic Studies:   Ct Abdomen Pelvis W Contrast  08/09/2014   CLINICAL DATA:  Patient with intermittent left lower quadrant pain.  EXAM: CT ABDOMEN AND PELVIS WITH CONTRAST  TECHNIQUE: Multidetector CT imaging of the abdomen and pelvis was performed using the standard protocol following bolus administration of intravenous contrast.  CONTRAST:  72mL OMNIPAQUE IOHEXOL 300 MG/ML SOLN, 118mL OMNIPAQUE IOHEXOL 300 MG/ML SOLN  COMPARISON:  None.  FINDINGS: Lower chest: Dependent atelectasis within bilateral lower lobes normal heart size. No pericardial effusion.  Hepatobiliary: The liver is normal in size and contour. Status post cholecystectomy. No intrahepatic or extrahepatic biliary ductal dilatation.  Pancreas: Unremarkable  Spleen: Unremarkable  Adrenals/Urinary Tract: 2.7 cm low-density mass  within the right adrenal gland with an internal density of -1 Hounsfield units (image 19; series 2), compatible with adrenal adenoma. Kidneys enhance symmetrically with contrast. No hydronephrosis. Urinary bladder is unremarkable.  Stomach/Bowel: Sigmoid colonic diverticulosis. There is focal wall thickening and  inflammatory change involving the descending colon (image 62; series 2), compatible with acute diverticulitis. The appendix is normal. No small bowel obstruction.  Vascular/Lymphatic: Normal caliber abdominal aorta. No retroperitoneal lymphadenopathy.  Other: Uterus and adnexal structures are unremarkable.  Musculoskeletal: Lower lumbar spine degenerative changes. No aggressive or acute appearing osseous lesions.  IMPRESSION: Findings compatible with acute descending/sigmoid colonic diverticulitis. Consider correlation with colonoscopy upon resolution of the acute symptomatology.  Right adrenal adenoma.   Electronically Signed   By: Lovey Newcomer M.D.   On: 08/09/2014 17:48     Labs:   Basic Metabolic Panel:  Recent Labs Lab 08/09/14 1421 08/10/14 0148  NA 138 139  K 3.5 3.9  CL 108 109  CO2 23 26  GLUCOSE 93 132*  BUN 12 10  CREATININE 0.75 0.73  CALCIUM 8.6 8.0*   GFR Estimated Creatinine Clearance: 116.8 mL/min (by C-G formula based on Cr of 0.73). Liver Function Tests:  Recent Labs Lab 08/09/14 1421 08/10/14 0148  AST 69* 49*  ALT 70* 55*  ALKPHOS 91 77  BILITOT 0.6 0.6  PROT 7.8 6.2  ALBUMIN 4.1 3.1*    Recent Labs Lab 08/09/14 1421  LIPASE 23   Coagulation profile  Recent Labs Lab 08/09/14 2200  INR 1.07    CBC:  Recent Labs Lab 08/09/14 1421 08/10/14 0148  WBC 11.9* 10.4  NEUTROABS 8.5*  --   HGB 13.1 11.5*  HCT 39.7 34.9*  MCV 91.1 90.4  PLT 284 253   CBG:  Recent Labs Lab 08/10/14 0802  GLUCAP 111*   Microbiology No results found for this or any previous visit (from the past 240 hour(s)).   Discharge Instructions:       Discharge Instructions    Call MD for:  persistant nausea and vomiting    Complete by:  As directed      Call MD for:  severe uncontrolled pain    Complete by:  As directed      Call MD for:  temperature >100.4    Complete by:  As directed      Diet - low sodium heart healthy    Complete by:  As directed        Discharge instructions    Complete by:  As directed   Advance diet slowly.  Start with bland, soft foods.     Increase activity slowly    Complete by:  As directed      Other Restrictions    Complete by:  As directed   May return to work 08/19/14.            Medication List    TAKE these medications        bismuth subsalicylate 329 JM/42AS suspension  Commonly known as:  PEPTO BISMOL  Take 30 mLs by mouth every 6 (six) hours as needed for indigestion.     ciprofloxacin 500 MG tablet  Commonly known as:  CIPRO  Take 1 tablet (500 mg total) by mouth 2 (two) times daily.     HYDROcodone-acetaminophen 5-500 MG per tablet  Commonly known as:  VICODIN  Take 1 tablet by mouth every 6 (six) hours as needed for pain.     ibuprofen 200 MG tablet  Commonly  known as:  ADVIL,MOTRIN  Take 800 mg by mouth every 6 (six) hours as needed for moderate pain.     loratadine 10 MG tablet  Commonly known as:  CLARITIN  Take 10 mg by mouth daily.     metroNIDAZOLE 500 MG tablet  Commonly known as:  FLAGYL  Take 1 tablet (500 mg total) by mouth 3 (three) times daily.     ondansetron 4 MG disintegrating tablet  Commonly known as:  ZOFRAN ODT  Take 1 tablet (4 mg total) by mouth every 8 (eight) hours as needed for nausea or vomiting.     phentermine 37.5 MG capsule  Take 37.5 mg by mouth every morning.       Follow-up Information    Follow up with Paula King, [place] [facility].   Why:  If symptoms worsen   Contact information:   [address] [place] [address] [phone]        Time coordinating discharge: 25 minutes.  Signed:  Hamp Moreland  Pager [phone] [facility] 08/10/2014, 2:52 PM

## 2014-08-10 NOTE — Progress Notes (Signed)
Pt left at this time with her "friend" by her side. Alert, oriented, and without c/o. Discharge instructions/prescriptions given/explained with pt verbalizing understanding.

## 2014-08-11 LAB — URINE CULTURE
COLONY COUNT: NO GROWTH
CULTURE: NO GROWTH

## 2014-08-11 LAB — HEPATITIS PANEL, ACUTE
HCV Ab: NEGATIVE
Hep A IgM: NONREACTIVE
Hep B C IgM: NONREACTIVE
Hepatitis B Surface Ag: NEGATIVE

## 2014-08-12 LAB — HIV ANTIBODY (ROUTINE TESTING W REFLEX): HIV Screen 4th Generation wRfx: NONREACTIVE

## 2014-08-16 LAB — CULTURE, BLOOD (ROUTINE X 2)
Culture: NO GROWTH
Culture: NO GROWTH

## 2015-02-03 ENCOUNTER — Emergency Department (HOSPITAL_COMMUNITY)
Admission: EM | Admit: 2015-02-03 | Discharge: 2015-02-03 | Disposition: A | Discharge status: Home / Self Care | Payer: BLUE CROSS/BLUE SHIELD | Attending: Physician Assistant | Admitting: Physician Assistant

## 2015-02-03 ENCOUNTER — Encounter (HOSPITAL_COMMUNITY): Payer: Self-pay | Admitting: Emergency Medicine

## 2015-02-03 ENCOUNTER — Emergency Department (HOSPITAL_COMMUNITY): Payer: BLUE CROSS/BLUE SHIELD

## 2015-02-03 DIAGNOSIS — R000 Tachycardia, unspecified: Secondary | ICD-10-CM

## 2015-02-03 DIAGNOSIS — R5383 Other fatigue: Secondary | ICD-10-CM | POA: Insufficient documentation

## 2015-02-03 DIAGNOSIS — Z792 Long term (current) use of antibiotics: Secondary | ICD-10-CM | POA: Diagnosis not present

## 2015-02-03 DIAGNOSIS — Z8719 Personal history of other diseases of the digestive system: Secondary | ICD-10-CM | POA: Diagnosis not present

## 2015-02-03 DIAGNOSIS — R110 Nausea: Secondary | ICD-10-CM | POA: Insufficient documentation

## 2015-02-03 DIAGNOSIS — R002 Palpitations: Secondary | ICD-10-CM | POA: Diagnosis present

## 2015-02-03 DIAGNOSIS — Z3202 Encounter for pregnancy test, result negative: Secondary | ICD-10-CM | POA: Diagnosis not present

## 2015-02-03 DIAGNOSIS — R0789 Other chest pain: Secondary | ICD-10-CM | POA: Diagnosis not present

## 2015-02-03 DIAGNOSIS — Z79899 Other long term (current) drug therapy: Secondary | ICD-10-CM | POA: Diagnosis not present

## 2015-02-03 HISTORY — DX: Diverticulitis of intestine, part unspecified, without perforation or abscess without bleeding: K57.92

## 2015-02-03 LAB — CBC WITH DIFFERENTIAL/PLATELET
Basophils Absolute: 0 10*3/uL (ref 0.0–0.1)
Basophils Relative: 1 % (ref 0–1)
EOS ABS: 0.2 10*3/uL (ref 0.0–0.7)
EOS PCT: 3 % (ref 0–5)
HEMATOCRIT: 43.5 % (ref 36.0–46.0)
HEMOGLOBIN: 15 g/dL (ref 12.0–15.0)
Lymphocytes Relative: 31 % (ref 12–46)
Lymphs Abs: 2.5 10*3/uL (ref 0.7–4.0)
MCH: 30.9 pg (ref 26.0–34.0)
MCHC: 34.5 g/dL (ref 30.0–36.0)
MCV: 89.5 fL (ref 78.0–100.0)
MONOS PCT: 7 % (ref 3–12)
Monocytes Absolute: 0.6 10*3/uL (ref 0.1–1.0)
Neutro Abs: 4.9 10*3/uL (ref 1.7–7.7)
Neutrophils Relative %: 58 % (ref 43–77)
Platelets: 317 10*3/uL (ref 150–400)
RBC: 4.86 MIL/uL (ref 3.87–5.11)
RDW: 13.2 % (ref 11.5–15.5)
WBC: 8.3 10*3/uL (ref 4.0–10.5)

## 2015-02-03 LAB — BASIC METABOLIC PANEL
ANION GAP: 10 (ref 5–15)
BUN: 14 mg/dL (ref 6–20)
CO2: 27 mmol/L (ref 22–32)
Calcium: 9.2 mg/dL (ref 8.9–10.3)
Chloride: 102 mmol/L (ref 101–111)
Creatinine, Ser: 1 mg/dL (ref 0.44–1.00)
GFR calc Af Amer: >60 mL/min (ref >60)
GFR calc non Af Amer: >60 mL/min (ref >60)
Glucose, Bld: 97 mg/dL (ref 65–99)
POTASSIUM: 3.4 mmol/L — AB (ref 3.5–5.1)
Sodium: 139 mmol/L (ref 135–145)

## 2015-02-03 LAB — HCG, SERUM, QUALITATIVE: Preg, Serum: NEGATIVE

## 2015-02-03 LAB — D-DIMER, QUANTITATIVE: D-Dimer, Quant: <0.27 ug/mL-FEU (ref 0.00–0.48)

## 2015-02-03 LAB — TROPONIN I

## 2015-02-03 IMAGING — DX DG CHEST 2V
2 series · 2 of 2 positions shown · non-contrast
Comparison: None.

CLINICAL DATA: Upper chest pain and shortness of Breath for 1 day

EXAM:
CHEST - 2 VIEW

[chest pa]
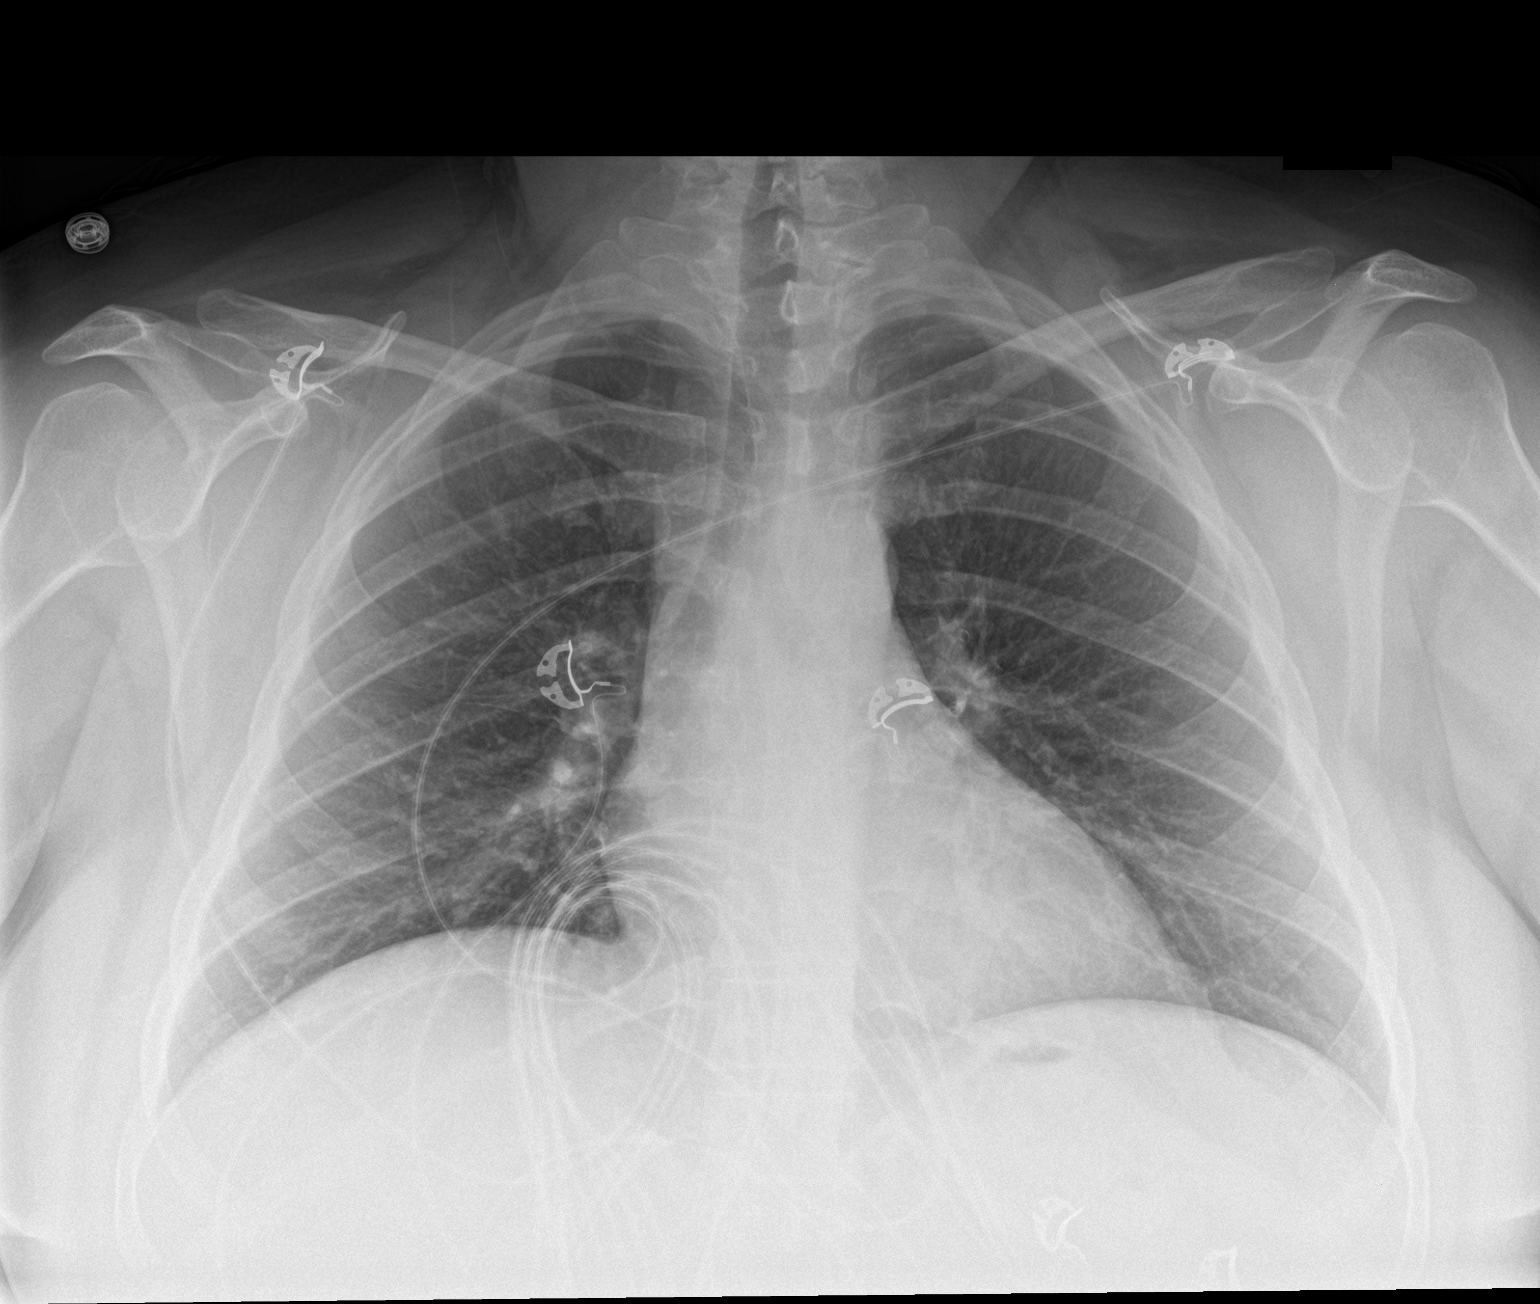

[chest lat]
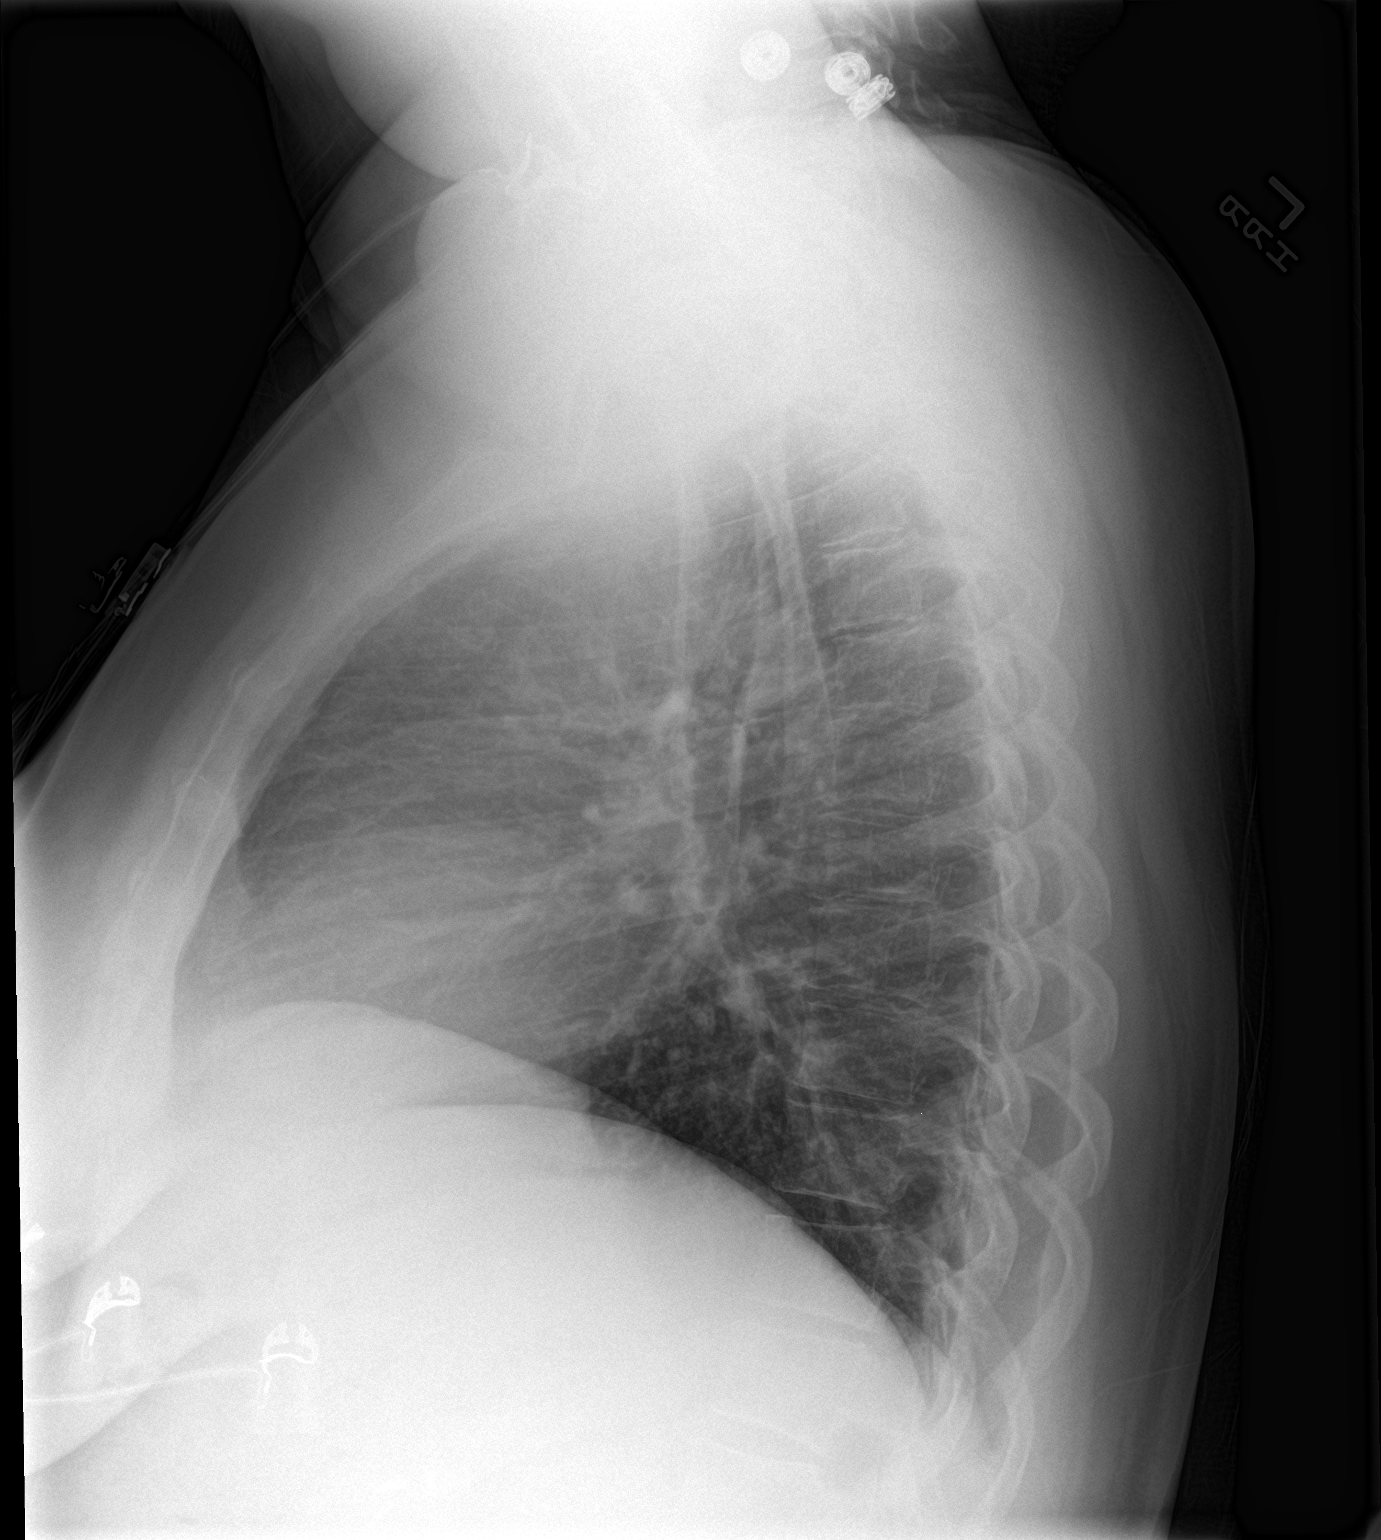

[2 of 2 positions shown; findings below may reference images not displayed]

FINDINGS: The heart size and mediastinal contours are within normal limits.
Both lungs are clear. The visualized skeletal structures are
unremarkable.
IMPRESSION: No active disease.

## 2015-02-03 MED ORDER — KETOROLAC TROMETHAMINE 30 MG/ML IJ SOLN
30.0000 mg | Freq: Once | INTRAMUSCULAR | Status: AC
  Administered 2015-02-03: 30 mg via INTRAVENOUS
  Filled 2015-02-03: qty 1

## 2015-02-03 MED ORDER — SODIUM CHLORIDE 0.9 % IV BOLUS (SEPSIS)
1000.0000 mL | Freq: Once | INTRAVENOUS | Status: AC
  Administered 2015-02-03: 1000 mL via INTRAVENOUS

## 2015-02-03 NOTE — Discharge Instructions (Signed)
You were seen today for tachycardia in the setting of a new medication. Please stop the new medication. We agree with your primary care provider is likely a result of the medication. Your d-dimer was negative. Chest x-ray was negative and your labs are all normal. Please return immediately if you've any additional symptoms. Nonspecific Tachycardia Tachycardia is a faster than normal heartbeat (more than 100 beats per minute). In adults, the heart normally beats between 60 and 100 times a minute. A fast heartbeat may be a normal response to exercise or stress. It does not necessarily mean that something is wrong. However, sometimes when your heart beats too fast it may not be able to pump enough blood to the rest of your body. This can result in chest pain, shortness of breath, dizziness, and even fainting. Nonspecific tachycardia means that the specific cause or pattern of your tachycardia is unknown. CAUSES  Tachycardia may be harmless or it may be due to a more serious underlying cause. Possible causes of tachycardia include:  Exercise or exertion.  Fever.  Pain or injury.  Infection.  Loss of body fluids (dehydration).  Overactive thyroid.  Lack of red blood cells (anemia).  Anxiety and stress.  Alcohol.  Caffeine.  Tobacco products.  Diet pills.  Illegal drugs.  Heart disease. SYMPTOMS  Rapid or irregular heartbeat (palpitations).  Suddenly feeling your heart beating (cardiac awareness).  Dizziness.  Tiredness (fatigue).  Shortness of breath.  Chest pain.  Nausea.  Fainting. DIAGNOSIS  Your caregiver will perform a physical exam and take your medical history. In some cases, a heart specialist (cardiologist) may be consulted. Your caregiver may also order:  Blood tests.  Electrocardiography. This test records the electrical activity of your heart.  A heart monitoring test. TREATMENT  Treatment will depend on the likely cause of your tachycardia. The goal  is to treat the underlying cause of your tachycardia. Treatment methods may include:  Replacement of fluids or blood through an intravenous (IV) tube for moderate to severe dehydration or anemia.  New medicines or changes in your current medicines.  Diet and lifestyle changes.  Treatment for certain infections.  Stress relief or relaxation methods. HOME CARE INSTRUCTIONS   Rest.  Drink enough fluids to keep your urine clear or pale yellow.  Do not smoke.  Avoid:  Caffeine.  Tobacco.  Alcohol.  Chocolate.  Stimulants such as over-the-counter diet pills or pills that help you stay awake.  Situations that cause anxiety or stress.  Illegal drugs such as marijuana, phencyclidine (PCP), and cocaine.  Only take medicine as directed by your caregiver.  Keep all follow-up appointments as directed by your caregiver. SEEK IMMEDIATE MEDICAL CARE IF:   You have pain in your chest, upper arms, jaw, or neck.  You become weak, dizzy, or feel faint.  You have palpitations that will not go away.  You vomit, have diarrhea, or pass blood in your stool.  Your skin is cool, pale, and wet.  You have a fever that will not go away with rest, fluids, and medicine. MAKE SURE YOU:   Understand these instructions.  Will watch your condition.  Will get help right away if you are not doing well or get worse. Document Released: 07/22/2004 Document Revised: 09/06/2011 Document Reviewed: 05/25/2011 [facility] Patient Information 2015 [place], [place]. This information is not intended to replace advice given to you by your health care provider. Make sure you discuss any questions you have with your health care provider.

## 2015-02-03 NOTE — ED Provider Notes (Signed)
CSN: [id number]     Arrival date & time 02/03/15  0932 History   This chart was scribed for Courteney Julio Alm, MD by Hilda Lias, ED Scribe. This patient was seen in room APA14/APA14 and the patient's care was started at 10:10 AM.  Chief Complaint  Patient presents with  . Palpitations     Patient is a 34 y.o. female presenting with palpitations. The history is provided by the patient. No language interpreter was used.  Palpitations Associated symptoms: nausea   Associated symptoms: no back pain, no chest pain and no cough      HPI Comments: Paula King is a 34 y.o. female who presents to the Emergency Department complaining of intermittent worsening palpitations with associated nausea, fatigue, and chest tightness and pain radiating to the left side of her neck that have been present for 3 days. Pt states that her symptoms worsened today and states she had to leave work because her symptoms were concerning of a heart attack. Pt states today that her heart rates were 125 and 136 when she checked prior to arrival. Pt reports seeing her PCP two days ago and received blood work to test her thyroid function. Pt states she has gained 30 lbs in the past month. Pt reports she feels that her heart rate has slowed down a lot since arriving to ED. Pt denies any long drives or flight, andy recent surgeries, and no leg swelling.     Past Medical History  Diagnosis Date  . Diverticulitis    Past Surgical History  Procedure Laterality Date  . Cholecystectomy    . Cesarean section     History reviewed. No pertinent family history. History  Substance Use Topics  . Smoking status: Never Smoker   . Smokeless tobacco: Not on file  . Alcohol Use: No   OB History    No data available     Review of Systems  Constitutional: Positive for fatigue. Negative for appetite change.  HENT: Negative for congestion, ear discharge and sinus pressure.   Eyes: Negative for discharge.  Respiratory:  Positive for chest tightness. Negative for cough.   Cardiovascular: Positive for palpitations. Negative for chest pain.  Gastrointestinal: Positive for nausea. Negative for abdominal pain and diarrhea.  Genitourinary: Negative for frequency and hematuria.  Musculoskeletal: Negative for back pain.  Skin: Negative for rash.  Neurological: Negative for seizures and headaches.  Psychiatric/Behavioral: Negative for hallucinations.      Allergies  Ciprofloxacin  Home Medications   Prior to Admission medications   Medication Sig Start Date End Date Taking? Authorizing Provider  bismuth subsalicylate (PEPTO BISMOL) 262 MG/15ML suspension Take 30 mLs by mouth every 6 (six) hours as needed for indigestion.    Historical Provider, MD  ciprofloxacin (CIPRO) 500 MG tablet Take 1 tablet (500 mg total) by mouth 2 (two) times daily. 08/10/14   Venetia Maxon Rama, MD  HYDROcodone-acetaminophen (VICODIN) 5-500 MG per tablet Take 1 tablet by mouth every 6 (six) hours as needed for pain. 08/10/14   Venetia Maxon Rama, MD  ibuprofen (ADVIL,MOTRIN) 200 MG tablet Take 800 mg by mouth every 6 (six) hours as needed for moderate pain.    Historical Provider, MD  loratadine (CLARITIN) 10 MG tablet Take 10 mg by mouth daily.    Historical Provider, MD  metroNIDAZOLE (FLAGYL) 500 MG tablet Take 1 tablet (500 mg total) by mouth 3 (three) times daily. 08/10/14   Venetia Maxon Rama, MD  ondansetron (ZOFRAN ODT) 4 MG disintegrating tablet  Take 1 tablet (4 mg total) by mouth every 8 (eight) hours as needed for nausea or vomiting. 08/10/14   Venetia Maxon Rama, MD  phentermine 37.5 MG capsule Take 37.5 mg by mouth every morning.    Historical Provider, MD   BP 123/92 mmHg  Pulse 108  Temp(Src) 98 F (36.7 C) (Oral)  Resp 18  Ht 5\' 4"  (1.626 m)  Wt 243 lb (110.224 kg)  BMI 41.69 kg/m2  LMP 01/03/2015 Physical Exam  Constitutional: She appears well-developed and well-nourished.  Eyes: Pupils are equal, round, and reactive to  light.  Cardiovascular: Normal rate and regular rhythm.   Pulmonary/Chest: Effort normal and breath sounds normal. No respiratory distress. She has no wheezes. She has no rales. She exhibits no tenderness.  Abdominal: Soft. Bowel sounds are normal. She exhibits no mass. There is no tenderness. There is no rebound and no guarding.  Musculoskeletal: Normal range of motion. She exhibits no edema or tenderness.  No swelling bilaterally in legs    ED Course  Procedures (including critical care time)  DIAGNOSTIC STUDIES: Oxygen Saturation is 97% on room air, normal by my interpretation.    COORDINATION OF CARE: 10:17 AM Discussed treatment plan with pt at bedside and pt agreed to plan.   Labs Review Labs Reviewed - No data to display  Imaging Review No results found.   EKG Interpretation   Date/Time:  Monday February 03 2015 09:41:15 EDT Ventricular Rate:  106 PR Interval:  153 QRS Duration: 86 QT Interval:  338 QTC Calculation: 449 R Axis:   12 Text Interpretation:  Sinus tachycardia Baseline wander in lead(s) V5 no  evidence of acute ischemia. 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 She felt her heart rate at work and was 125. During EMS transport it was 110. Her heart rate has come down to 96 since arrival here. She recently started Wellbutrin 3 days ago. Patient does have about 11% of patients with tachycardia.  Patient has had no leg swelling not on birth control and no recent travel, however given her tachycardia she cannot perk out. We will get a d-dimer on her. Additionally we'll give her fluid and ibuprofen.  If troponin and d-dimer are negative we wil have her discontinue her Wellbutrin have her follow-up with her PCP.  Patient already has thyroid studies pending.    Courteney Julio Alm, MD 02/03/15 [phone]

## 2015-02-03 NOTE — ED Notes (Signed)
Pt reports mid sternal chest pain radiating to left side of neck. Pt reports recently started taking welbutrin x3 days. Pt reports sob yesterday. Pt reports while at work hr 125.

## 2015-02-03 NOTE — ED Notes (Signed)
Pt made aware to return if symptoms worsen or if any life threatening symptoms occur.   

## 2015-03-04 ENCOUNTER — Other Ambulatory Visit (HOSPITAL_COMMUNITY): Payer: Self-pay | Admitting: Internal Medicine

## 2015-03-04 DIAGNOSIS — R599 Enlarged lymph nodes, unspecified: Secondary | ICD-10-CM

## 2015-03-06 ENCOUNTER — Ambulatory Visit (HOSPITAL_COMMUNITY): Admission: RE | Admit: 2015-03-06 | Payer: BLUE CROSS/BLUE SHIELD | Source: Ambulatory Visit

## 2015-03-07 ENCOUNTER — Other Ambulatory Visit (HOSPITAL_COMMUNITY): Payer: BLUE CROSS/BLUE SHIELD

## 2015-03-14 ENCOUNTER — Ambulatory Visit (HOSPITAL_COMMUNITY)
Admission: RE | Admit: 2015-03-14 | Discharge: 2015-03-14 | Disposition: A | Discharge status: Home / Self Care | Payer: BLUE CROSS/BLUE SHIELD | Source: Ambulatory Visit | Attending: Internal Medicine | Admitting: Internal Medicine

## 2015-03-14 DIAGNOSIS — R591 Generalized enlarged lymph nodes: Secondary | ICD-10-CM | POA: Insufficient documentation

## 2015-03-14 DIAGNOSIS — R599 Enlarged lymph nodes, unspecified: Secondary | ICD-10-CM

## 2015-08-20 MED FILL — AMOXICILLIN 500 MG CAPSULE: 500 | 7 days supply | Qty: 21 | Fill #0

## 2015-08-20 MED FILL — oxyCODONE HCL 10 MG TABS: 10 | 5 days supply | Qty: 30 | Fill #0

## 2015-08-20 MED FILL — DEXAMETHASONE 4 MG TABLET: 4 | 3 days supply | Qty: 9 | Fill #0

## 2015-08-22 ENCOUNTER — Other Ambulatory Visit: Payer: Self-pay | Admitting: *Deleted

## 2015-08-22 DIAGNOSIS — R609 Edema, unspecified: Secondary | ICD-10-CM

## 2015-09-15 ENCOUNTER — Encounter: Payer: Self-pay | Admitting: Vascular Surgery

## 2015-09-19 ENCOUNTER — Encounter: Payer: BLUE CROSS/BLUE SHIELD | Admitting: Vascular Surgery

## 2015-09-19 ENCOUNTER — Encounter (HOSPITAL_COMMUNITY): Payer: BLUE CROSS/BLUE SHIELD

## 2016-05-10 IMAGING — US US SOFT TISSUE HEAD/NECK
1 series · 9 of 9 positions shown · non-contrast
Comparison: None.

CLINICAL DATA: Adenopathy

EXAM:
ULTRASOUND OF HEAD/NECK SOFT TISSUES
TECHNIQUE: Ultrasound examination of the head and neck soft tissues was
performed in the area of clinical concern.

[Series 1: us soft tissue head/neck · 0.04mm/px · 9 acquisitions, 9 frames shown]
[im 1/9]
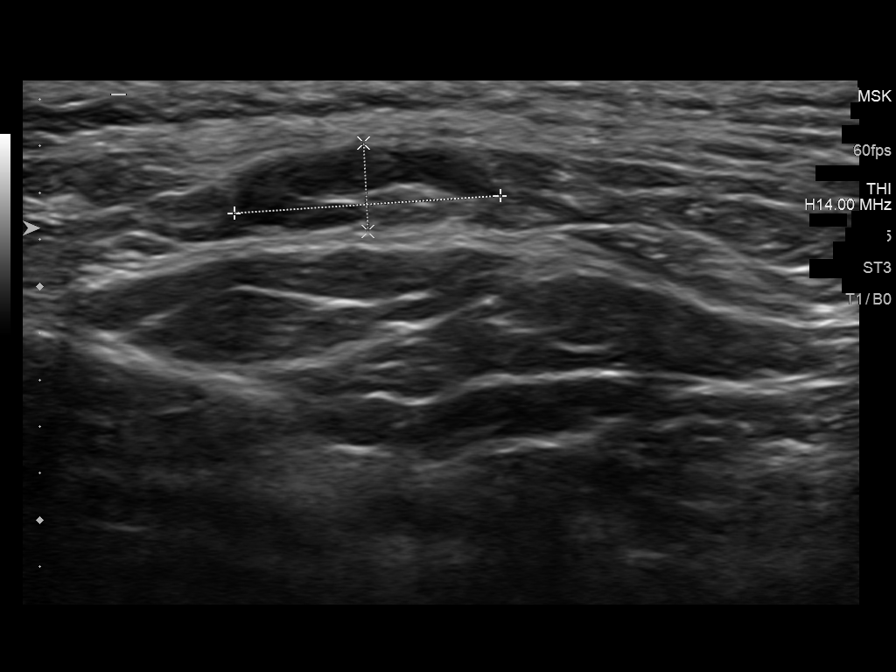
[im 2/9]
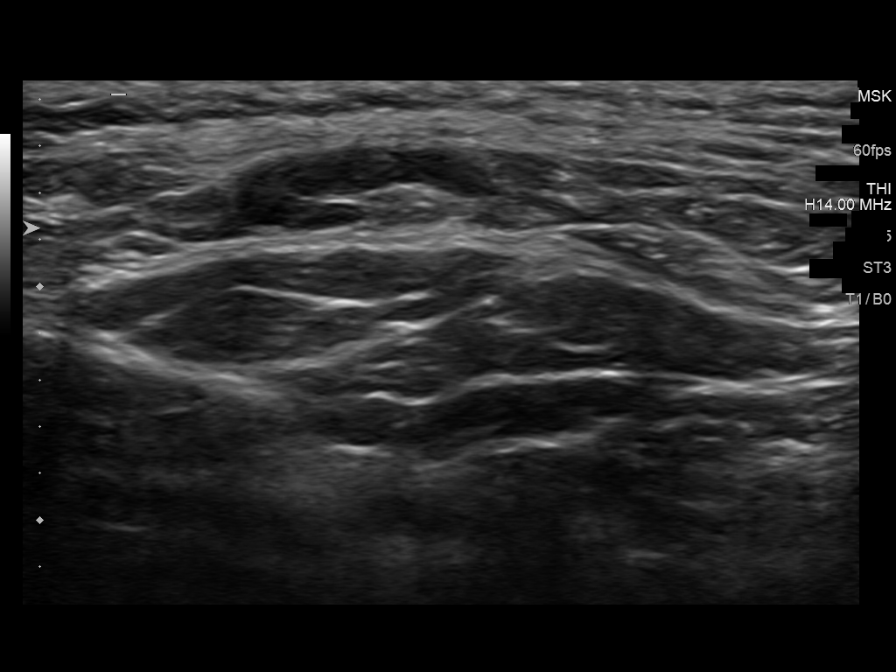
[im 3/9]
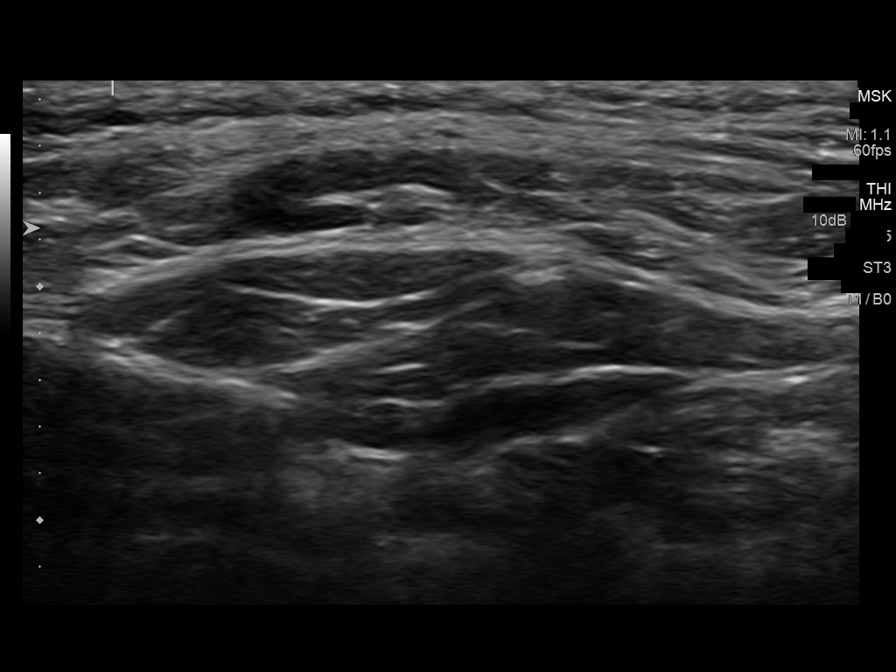
[im 4/9]
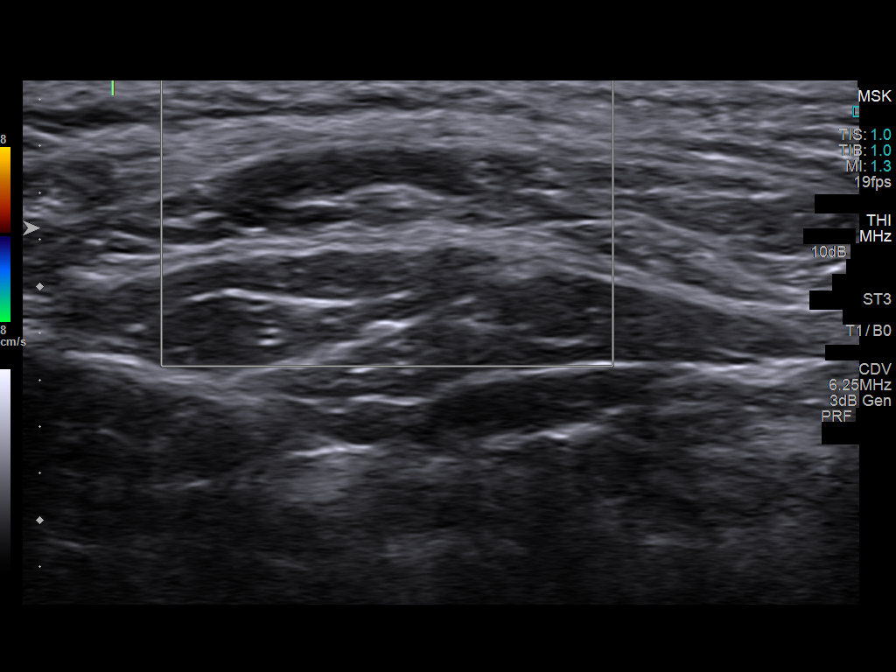
[im 5/9]
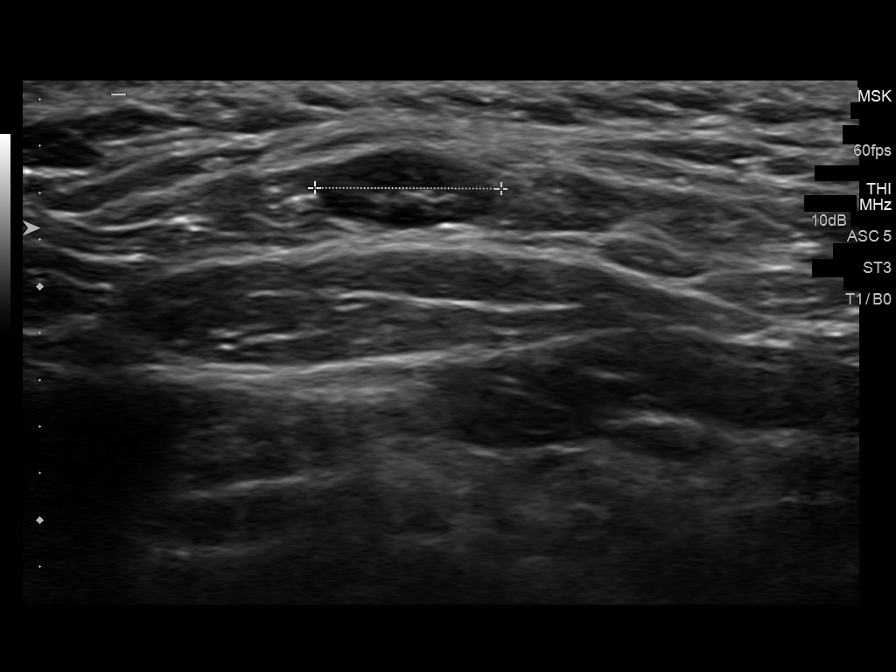
[im 6/9]
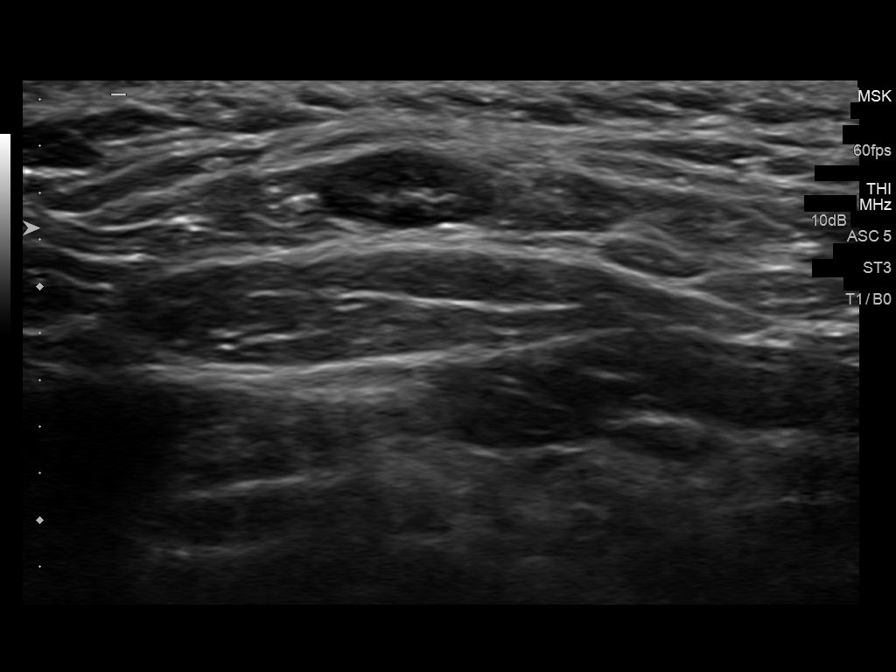
[im 7/9]
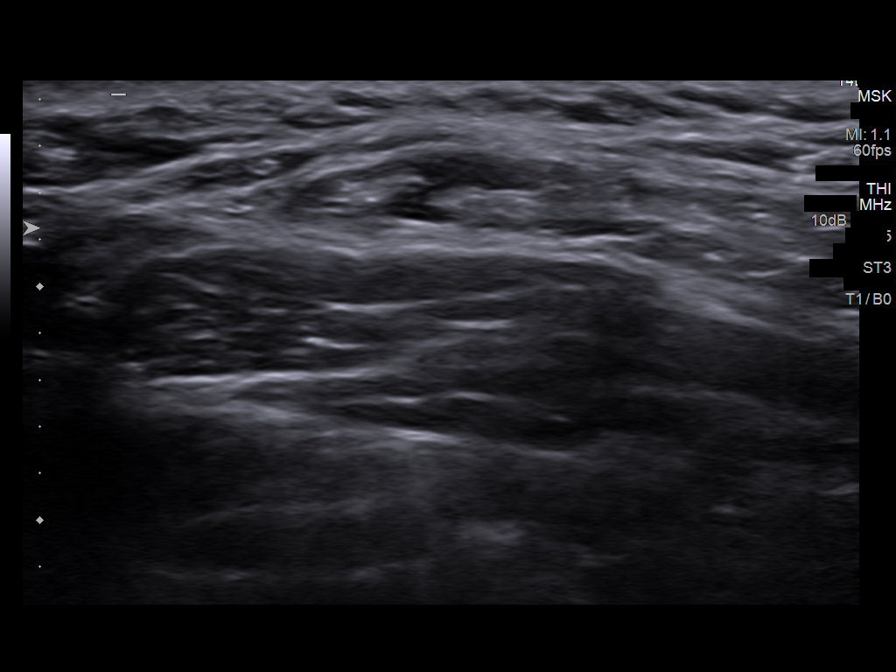
[im 8/9]
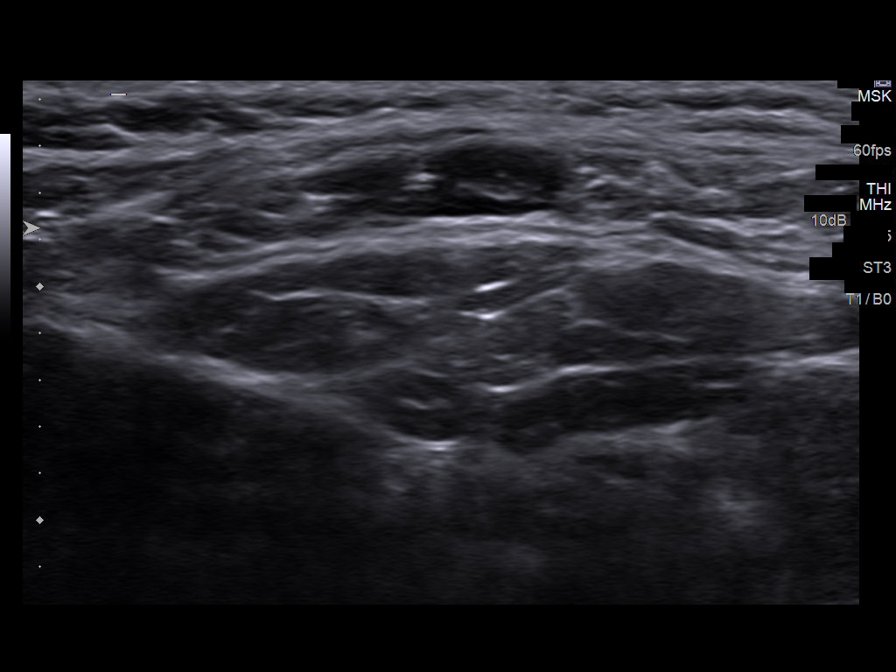
[im 9/9]
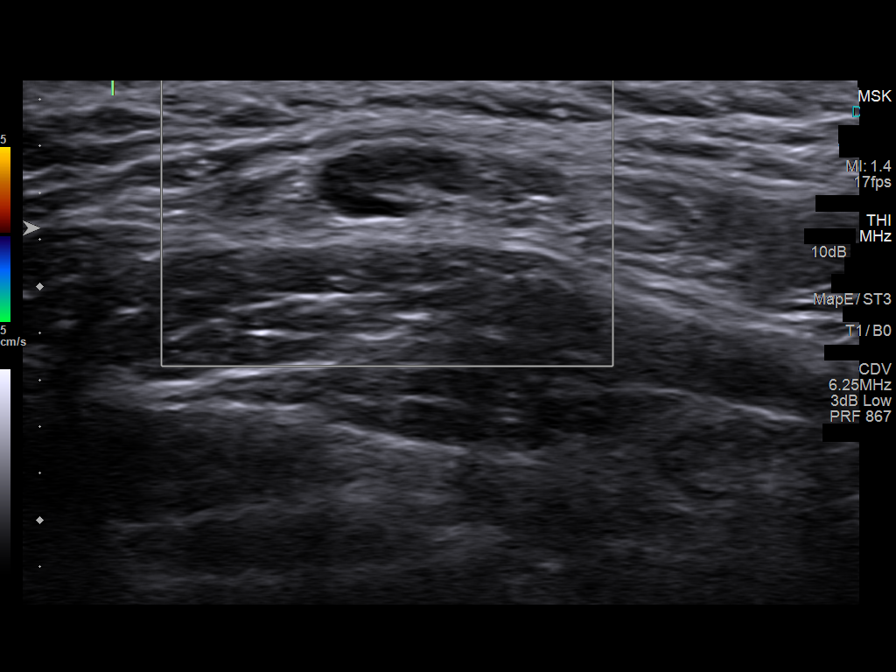

[9 of 9 positions shown; findings below may reference images not displayed]

FINDINGS: 11 x 4 x 8 mm subcutaneous lymph node with fatty hilum in the right
infra-auricular region corresponding to palpable region. No other
mass, cyst, or pathologic appearing adenopathy.
IMPRESSION: 1. Normal sized morphologically unremarkable right infra-auricular
cervical lymph node.

## 2016-11-15 ENCOUNTER — Ambulatory Visit: Payer: BLUE CROSS/BLUE SHIELD | Admitting: Registered"

## 2016-12-01 ENCOUNTER — Ambulatory Visit: Payer: BLUE CROSS/BLUE SHIELD | Admitting: Registered"

## 2016-12-03 ENCOUNTER — Encounter: Payer: Self-pay | Admitting: Registered"

## 2016-12-03 ENCOUNTER — Encounter: Payer: 59 | Attending: Family | Admitting: Registered"

## 2016-12-03 DIAGNOSIS — E639 Nutritional deficiency, unspecified: Secondary | ICD-10-CM | POA: Insufficient documentation

## 2016-12-03 DIAGNOSIS — Z713 Dietary counseling and surveillance: Secondary | ICD-10-CM | POA: Insufficient documentation

## 2016-12-03 NOTE — Patient Instructions (Signed)
Make sure to get in three meals per day to provide adequate energy for your body, to promote metabolism, and to avoid getting overly hungry later.   Try to eat balanced meals (see handout)-make sure to get in some carbohydrates too for energy.   Try to practice mindful eating-try to eat at a table without distractions. If you feel you are wanting to eat due to boredom or emotions and not because you are hungry try to find a fun hobby/activity to do. Maybe take a walk or take up crocheting. Try to change the habit of eating for comfort and try doing a fun activity instead.   [employer]-resource to look up nutritional information at restaurants.

## 2016-12-03 NOTE — Progress Notes (Addendum)
Medical Nutrition Therapy:  Appt start time: 0805 end time:  0850.   Assessment:  Primary concerns today: Pt referred for obesity. Pt says she gained 45 lb while on Depo shots previously. Pt says she has tried everything to lose weight including taking Belviq and changing her diet. Pt says she has been doing intermittent fasting and eating a low carbohydrate diet but nothing has worked. Pt is concerned her weight is affecting her blood pressure. Pt says she has been fearing weighing herself on the scales.    Preferred Learning Style:   No preference indicated   Learning Readiness:   Ready  MEDICATIONS: See list.    DIETARY INTAKE:  Usual eating pattern includes 2 meals and 2 snacks per day.  Pt says she has been doing intermittent fasting and only eating between 10 am and 6 pm. Pt says meals at home are usually eaten on the sofa with TV on and phone present. Pt says she is a fast eater. Pt says she experiences really strong food cravings, especially for chocolate and Dr. Malachi Bonds. Pt says when she is eating "clean," which she defines as low carbohydrate/no sugar, she has felt her blood sugar drop too low, making her feel dizzy. Pt says when that happened she drank a Dr. Malachi Bonds and ate a candy bar. Pt says her cravings for sweets are worse right before her menstrual cycle. Pt says she does struggle with emotional eating and says she uses food as a comfort. Pt says she and her friends' social gatherings usually revolve around eating/food. Pt says she tries to watch her sodium intake due to having hypertension and she has cut back on eating at restaurants/fast food.   Everyday foods include guacamole and fruits.  Avoided foods include beans due to the texture.    24-hr recall:  B ( 11 AM):  4 pieces of sausage and scrambled eggs Snk ( AM):  None reported.  L ( PM): None reported.  Snk (3 PM): guacamole alone  D ( PM): 2 soft tacos with cheese tomatoes and onions, 1 brownie  Snk ( PM): None  reported.  Beverages: water   Pt says she was drinking a gallon of water each day, but she has since cut back because she was retaining a lot of fluid.  Usual physical activity: swimming; walks around 1 mile 1-2 days per week.   Progress Towards Goal(s):  In progress.   Nutritional Diagnosis:  [id number] Predicted suboptimal nutrient intake As related to skipping meals and low intake of whole grains.  As evidenced by pt's diet recall.    Intervention:  Nutrition counseling provided. Dietitian counseled pt regarding the importance of balanced meals to provide adequate energy for the body and to promote metabolism. Dietitian educated pt on the importance of including carbohydrates in the diet for energy and how skipping meals slows metabolism and can lead to weight gain. Dietitian counseled pt regarding the importance of mindful eating and how skipping meals can lead to overeating later in the day. Dietitian encouraged pt to eat meals at a table without distractions. Pt says she struggles with emotional eating. Dietitian discussed strategies to reduce eating due to emotions and/or boredom. Pt says she would like to take walks more often and would like to learn how to crochet as a hobby. Dietitian encouraged pt to find fun activities to do with her friends that do not revolve around eating. Dietitian counseled pt on the importance of nourishing the body and focusing on providing  balanced nutrition and feeling well rather than focusing on the number on the scales. Dietitian discussed the importance of eating foods in moderation and that restricting the body from a certain food often causes the body to crave the food even more. Dietitian educated pt regarding eating a high fiber diet for diverticulosis. Dietitian discussed recommendations for average fluid intake with pt and that drinking a gallon of water was well above pt's daily fluid needs. Pt appeared agreeable to information/goals discussed.   Goals:    Make sure to get in three meals per day to provide adequate energy for your body, to promote metabolism, and to avoid getting overly hungry later.   Try to eat balanced meals (see handout)-make sure to get in some carbohydrates too for energy.   Try to practice mindful eating-try to eat at a table without distractions. If you feel you are wanting to eat due to boredom or emotions and not because you are hungry, try to find a fun hobby/activity to do. Maybe take a walk or take up crocheting. Try to change the habit of eating for comfort and try doing a fun activity instead.   Calorieking.com-resource to look up nutritional information at restaurants.   Teaching Method Utilized:  Auditory  Handouts given during visit include:  Balanced Plate  Food group list for balanced plate   Barriers to learning/adherence to lifestyle change: None indicated.   Demonstrated degree of understanding via:  Teach Back   Monitoring/Evaluation:  Dietary intake, exercise, and body weight in 1 month(s).

## 2016-12-14 DIAGNOSIS — S32031A Stable burst fracture of third lumbar vertebra, initial encounter for closed fracture: Secondary | ICD-10-CM | POA: Diagnosis not present

## 2016-12-14 DIAGNOSIS — H52203 Unspecified astigmatism, bilateral: Secondary | ICD-10-CM | POA: Diagnosis not present

## 2016-12-14 DIAGNOSIS — H5213 Myopia, bilateral: Secondary | ICD-10-CM | POA: Diagnosis not present

## 2017-01-10 ENCOUNTER — Ambulatory Visit: Payer: 59 | Admitting: Registered"

## 2017-02-17 ENCOUNTER — Telehealth: Payer: 59 | Admitting: Family

## 2017-02-17 DIAGNOSIS — R112 Nausea with vomiting, unspecified: Secondary | ICD-10-CM

## 2017-02-17 MED ORDER — ONDANSETRON 4 MG PO TBDP: 4.0000 mg | ORAL_TABLET | Freq: Three times a day (TID) | ORAL | 0 refills | Status: DC | PRN

## 2017-02-17 NOTE — Progress Notes (Signed)

## 2017-04-18 ENCOUNTER — Encounter: Payer: Self-pay | Admitting: Family Medicine

## 2017-04-18 ENCOUNTER — Telehealth: Payer: Self-pay | Admitting: Family Medicine

## 2017-04-18 ENCOUNTER — Ambulatory Visit (INDEPENDENT_AMBULATORY_CARE_PROVIDER_SITE_OTHER): Payer: 59 | Admitting: Family Medicine

## 2017-04-18 VITALS — BP 134/87 | HR 77 | Temp 97.8°F | Ht 64.0 in | Wt 264.0 lb

## 2017-04-18 DIAGNOSIS — E279 Disorder of adrenal gland, unspecified: Secondary | ICD-10-CM

## 2017-04-18 DIAGNOSIS — Z6841 Body Mass Index (BMI) 40.0 and over, adult: Secondary | ICD-10-CM | POA: Diagnosis not present

## 2017-04-18 DIAGNOSIS — Z13220 Encounter for screening for lipoid disorders: Secondary | ICD-10-CM

## 2017-04-18 DIAGNOSIS — E278 Other specified disorders of adrenal gland: Secondary | ICD-10-CM

## 2017-04-18 DIAGNOSIS — Z131 Encounter for screening for diabetes mellitus: Secondary | ICD-10-CM | POA: Diagnosis not present

## 2017-04-18 DIAGNOSIS — N912 Amenorrhea, unspecified: Secondary | ICD-10-CM

## 2017-04-18 LAB — POCT URINALYSIS DIP (DEVICE)
Bilirubin Urine: NEGATIVE
Glucose, UA: NEGATIVE mg/dL
HGB URINE DIPSTICK: NEGATIVE
Ketones, ur: NEGATIVE mg/dL
NITRITE: NEGATIVE
Protein, ur: NEGATIVE mg/dL
SPECIFIC GRAVITY, URINE: 1.025 (ref 1.005–1.030)
UROBILINOGEN UA: 0.2 mg/dL (ref 0.0–1.0)
pH: 5 (ref 5.0–8.0)

## 2017-04-18 LAB — CBC WITH DIFFERENTIAL/PLATELET
Basophils Absolute: 60 cells/uL (ref 0–200)
Basophils Relative: 0.8 %
EOS ABS: 173 {cells}/uL (ref 15–500)
EOS PCT: 2.3 %
HCT: 42.2 % (ref 35.0–45.0)
HEMOGLOBIN: 14.6 g/dL (ref 11.7–15.5)
Lymphs Abs: 2528 cells/uL (ref 850–3900)
MCH: 30.7 pg (ref 27.0–33.0)
MCHC: 34.6 g/dL (ref 32.0–36.0)
MCV: 88.8 fL (ref 80.0–100.0)
MONOS PCT: 7.2 %
MPV: 9.1 fL (ref 7.5–12.5)
NEUTROS ABS: 4200 {cells}/uL (ref 1500–7800)
NEUTROS PCT: 56 %
PLATELETS: 325 10*3/uL (ref 140–400)
RBC: 4.75 10*6/uL (ref 3.80–5.10)
RDW: 12.9 % (ref 11.0–15.0)
Total Lymphocyte: 33.7 %
WBC mixed population: 540 cells/uL (ref 200–950)
WBC: 7.5 10*3/uL (ref 3.8–10.8)

## 2017-04-18 LAB — LIPID PANEL
Cholesterol: 141 mg/dL (ref <200)
HDL: 46 mg/dL — ABNORMAL LOW (ref >50)
LDL Cholesterol (Calc): 75 mg/dL (calc)
NON-HDL CHOLESTEROL (CALC): 95 mg/dL (ref <130)
Total CHOL/HDL Ratio: 3.1 (calc) (ref <5.0)
Triglycerides: 115 mg/dL (ref <150)

## 2017-04-18 LAB — COMPLETE METABOLIC PANEL WITH GFR
AG Ratio: 1.6 (calc) (ref 1.0–2.5)
ALT: 30 U/L — AB (ref 6–29)
AST: 26 U/L (ref 10–30)
Albumin: 4.5 g/dL (ref 3.6–5.1)
Alkaline phosphatase (APISO): 68 U/L (ref 33–115)
BILIRUBIN TOTAL: 0.5 mg/dL (ref 0.2–1.2)
BUN/Creatinine Ratio: 10 (calc) (ref 6–22)
BUN: 11 mg/dL (ref 7–25)
CHLORIDE: 100 mmol/L (ref 98–110)
CO2: 20 mmol/L (ref 20–32)
Calcium: 9.4 mg/dL (ref 8.6–10.2)
Creat: 1.11 mg/dL — ABNORMAL HIGH (ref 0.50–1.10)
GFR, Est African American: 74 mL/min/{1.73_m2} (ref >=60)
GFR, Est Non African American: 64 mL/min/{1.73_m2} (ref >=60)
GLOBULIN: 2.8 g/dL (ref 1.9–3.7)
Glucose, Bld: 103 mg/dL — ABNORMAL HIGH (ref 65–99)
Potassium: 4.5 mmol/L (ref 3.5–5.3)
SODIUM: 147 mmol/L — AB (ref 135–146)
Total Protein: 7.3 g/dL (ref 6.1–8.1)

## 2017-04-18 LAB — THYROID PANEL WITH TSH
FREE THYROXINE INDEX: 2.5 (ref 1.4–3.8)
T3 UPTAKE: 27 % (ref 22–35)
T4, Total: 9.1 ug/dL (ref 5.1–11.9)
TSH: 1.83 m[IU]/L

## 2017-04-18 LAB — POCT URINE PREGNANCY: PREG TEST UR: NEGATIVE

## 2017-04-18 NOTE — Patient Instructions (Signed)
Liraglutide injection (Weight Management) What is this medicine? LIRAGLUTIDE (LIR a GLOO tide) is used with a reduced calorie diet and exercise to help you lose weight. This medicine may be used for other purposes; ask your health care provider or pharmacist if you have questions. COMMON BRAND NAME(S): Saxenda What should I tell my health care provider before I take this medicine? They need to know if you have any of these conditions: -endocrine tumors (MEN 2) or if someone in your family had these tumors -gallbladder disease -high cholesterol -history of alcohol abuse problem -history of pancreatitis -kidney disease or if you are on dialysis -liver disease -previous swelling of the tongue, face, or lips with difficulty breathing, difficulty swallowing, hoarseness, or tightening of the throat -stomach problems -suicidal thoughts, plans, or attempt; a previous suicide attempt by you or a family member -thyroid cancer or if someone in your family had thyroid cancer -an unusual or allergic reaction to liraglutide, other medicines, foods, dyes, or preservatives -pregnant or trying to get pregnant -breast-feeding How should I use this medicine? This medicine is for injection under the skin of your upper leg, stomach area, or upper arm. You will be taught how to prepare and give this medicine. Use exactly as directed. Take your medicine at regular intervals. Do not take it more often than directed. It is important that you put your used needles and syringes in a special sharps container. Do not put them in a trash can. If you do not have a sharps container, call your pharmacist or healthcare provider to get one. A special MedGuide will be given to you by the pharmacist with each prescription and refill. Be sure to read this information carefully each time. Talk to your pediatrician regarding the use of this medicine in children. Special care may be needed. Overdosage: If you think you have  taken too much of this medicine contact a poison control center or emergency room at once. NOTE: This medicine is only for you. Do not share this medicine with others. What if I miss a dose? If you miss a dose, take it as soon as you can. If it is almost time for your next dose, take only that dose. Do not take double or extra doses. If you miss your dose for 3 days or more, call your doctor or health care professional to talk about how to restart this medicine. What may interact with this medicine? -insulin and other medicines for diabetes This list may not describe all possible interactions. Give your health care provider a list of all the medicines, herbs, non-prescription drugs, or dietary supplements you use. Also tell them if you smoke, drink alcohol, or use illegal drugs. Some items may interact with your medicine. What should I watch for while using this medicine? Visit your doctor or health care professional for regular checks on your progress. This medicine is intended to be used in addition to a healthy diet and appropriate exercise. The best results are achieved this way. Do not increase or in any way change your dose without consulting your doctor or health care professional. Drink plenty of fluids while taking this medicine. Check with your doctor or health care professional if you get an attack of severe diarrhea, nausea, and vomiting. The loss of too much body fluid can make it dangerous for you to take this medicine. This medicine may affect blood sugar levels. If you have diabetes, check with your doctor or health care professional before you change your diet  or the dose of your diabetic medicine. Patients and their families should watch out for worsening depression or thoughts of suicide. Also watch out for sudden changes in feelings such as feeling anxious, agitated, panicky, irritable, hostile, aggressive, impulsive, severely restless, overly excited and hyperactive, or not being able  to sleep. If this happens, especially at the beginning of treatment or after a change in dose, call your health care professional. What side effects may I notice from receiving this medicine? Side effects that you should report to your doctor or health care professional as soon as possible: -allergic reactions like skin rash, itching or hives, swelling of the face, lips, or tongue -breathing problems -diarrhea that continues or is severe -lump or swelling on the neck -severe nausea -signs and symptoms of infection like fever or chills; cough; sore throat; pain or trouble passing urine -signs and symptoms of low blood sugar such as feeling anxious, confusion, dizziness, increased hunger, unusually weak or tired, sweating, shakiness, cold, irritable, headache, blurred vision, fast heartbeat, loss of consciousness -signs and symptoms of kidney injury like trouble passing urine or change in the amount of urine -trouble swallowing -unusual stomach upset or pain -vomiting Side effects that usually do not require medical attention (report to your doctor or health care professional if they continue or are bothersome): -constipation -decreased appetite -diarrhea -fatigue -headache -nausea -pain, redness, or irritation at site where injected -stomach upset -stuffy or runny nose This list may not describe all possible side effects. Call your doctor for medical advice about side effects. You may report side effects to FDA at 1-800-FDA-1088. Where should I keep my medicine? Keep out of the reach of children. Store unopened pen in a refrigerator between 2 and 8 degrees C (36 and 46 degrees F). Do not freeze or use if the medicine has been frozen. Protect from light and excessive heat. After you first use the pen, it can be stored at room temperature between 15 and 30 degrees C (59 and 86 degrees F) or in a refrigerator. Throw away your used pen after 30 days or after the expiration date, whichever comes  first. Do not store your pen with the needle attached. If the needle is left on, medicine may leak from the pen. NOTE: This sheet is a summary. It may not cover all possible information. If you have questions about this medicine, talk to your doctor, pharmacist, or health care provider.  2018 Elsevier/Gold Standard (2016-07-01 14:41:37)  Calorie Counting for Weight Loss Calories are units of energy. Your body needs a certain amount of calories from food to keep you going throughout the day. When you eat more calories than your body needs, your body stores the extra calories as fat. When you eat fewer calories than your body needs, your body burns fat to get the energy it needs. Calorie counting means keeping track of how many calories you eat and drink each day. Calorie counting can be helpful if you need to lose weight. If you make sure to eat fewer calories than your body needs, you should lose weight. Ask your health care provider what a healthy weight is for you. For calorie counting to work, you will need to eat the right number of calories in a day in order to lose a healthy amount of weight per week. A dietitian can help you determine how many calories you need in a day and will give you suggestions on how to reach your calorie goal.  A healthy amount of  weight to lose per week is usually 1-2 lb (0.5-0.9 kg). This usually means that your daily calorie intake should be reduced by 500-750 calories.  Eating 1,200 - 1,500 calories per day can help most women lose weight.  Eating 1,500 - 1,800 calories per day can help most men lose weight.  What is my plan? My goal is to have __________ calories per day. If I have this many calories per day, I should lose around __________ pounds per week. What do I need to know about calorie counting? In order to meet your daily calorie goal, you will need to:  Find out how many calories are in each food you would like to eat. Try to do this before you  eat.  Decide how much of the food you plan to eat.  Write down what you ate and how many calories it had. Doing this is called keeping a food log.  To successfully lose weight, it is important to balance calorie counting with a healthy lifestyle that includes regular activity. Aim for 150 minutes of moderate exercise (such as walking) or 75 minutes of vigorous exercise (such as running) each week. Where do I find calorie information?  The number of calories in a food can be found on a Nutrition Facts label. If a food does not have a Nutrition Facts label, try to look up the calories online or ask your dietitian for help. Remember that calories are listed per serving. If you choose to have more than one serving of a food, you will have to multiply the calories per serving by the amount of servings you plan to eat. For example, the label on a package of bread might say that a serving size is 1 slice and that there are 90 calories in a serving. If you eat 1 slice, you will have eaten 90 calories. If you eat 2 slices, you will have eaten 180 calories. How do I keep a food log? Immediately after each meal, record the following information in your food log:  What you ate. Don't forget to include toppings, sauces, and other extras on the food.  How much you ate. This can be measured in cups, ounces, or number of items.  How many calories each food and drink had.  The total number of calories in the meal.  Keep your food log near you, such as in a small notebook in your pocket, or use a mobile app or website. Some programs will calculate calories for you and show you how many calories you have left for the day to meet your goal. What are some calorie counting tips?  Use your calories on foods and drinks that will fill you up and not leave you hungry: ? Some examples of foods that fill you up are nuts and nut butters, vegetables, lean proteins, and high-fiber foods like whole grains. High-fiber  foods are foods with more than 5 g fiber per serving. ? Drinks such as sodas, specialty coffee drinks, alcohol, and juices have a lot of calories, yet do not fill you up.  Eat nutritious foods and avoid empty calories. Empty calories are calories you get from foods or beverages that do not have many vitamins or protein, such as candy, sweets, and soda. It is better to have a nutritious high-calorie food (such as an avocado) than a food with few nutrients (such as a bag of chips).  Know how many calories are in the foods you eat most often. This will help  you calculate calorie counts faster.  Pay attention to calories in drinks. Low-calorie drinks include water and unsweetened drinks.  Pay attention to nutrition labels for "low fat" or "fat free" foods. These foods sometimes have the same amount of calories or more calories than the full fat versions. They also often have added sugar, starch, or salt, to make up for flavor that was removed with the fat.  Find a way of tracking calories that works for you. Get creative. Try different apps or programs if writing down calories does not work for you. What are some portion control tips?  Know how many calories are in a serving. This will help you know how many servings of a certain food you can have.  Use a measuring cup to measure serving sizes. You could also try weighing out portions on a kitchen scale. With time, you will be able to estimate serving sizes for some foods.  Take some time to put servings of different foods on your favorite plates, bowls, and cups so you know what a serving looks like.  Try not to eat straight from a bag or box. Doing this can lead to overeating. Put the amount you would like to eat in a cup or on a plate to make sure you are eating the right portion.  Use smaller plates, glasses, and bowls to prevent overeating.  Try not to multitask (for example, watch TV or use your computer) while eating. If it is time to eat,  sit down at a table and enjoy your food. This will help you to know when you are full. It will also help you to be aware of what you are eating and how much you are eating. What are tips for following this plan? Reading food labels  Check the calorie count compared to the serving size. The serving size may be smaller than what you are used to eating.  Check the source of the calories. Make sure the food you are eating is high in vitamins and protein and low in saturated and trans fats. Shopping  Read nutrition labels while you shop. This will help you make healthy decisions before you decide to purchase your food.  Make a grocery list and stick to it. Cooking  Try to cook your favorite foods in a healthier way. For example, try baking instead of frying.  Use low-fat dairy products. Meal planning  Use more fruits and vegetables. Half of your plate should be fruits and vegetables.  Include lean proteins like poultry and fish. How do I count calories when eating out?  Ask for smaller portion sizes.  Consider sharing an entree and sides instead of getting your own entree.  If you get your own entree, eat only half. Ask for a box at the beginning of your meal and put the rest of your entree in it so you are not tempted to eat it.  If calories are listed on the menu, choose the lower calorie options.  Choose dishes that include vegetables, fruits, whole grains, low-fat dairy products, and lean protein.  Choose items that are boiled, broiled, grilled, or steamed. Stay away from items that are buttered, battered, fried, or served with cream sauce. Items labeled "crispy" are usually fried, unless stated otherwise.  Choose water, low-fat milk, unsweetened iced tea, or other drinks without added sugar. If you want an alcoholic beverage, choose a lower calorie option such as a glass of wine or light beer.  Ask for dressings, sauces, and  syrups on the side. These are usually high in  calories, so you should limit the amount you eat.  If you want a salad, choose a garden salad and ask for grilled meats. Avoid extra toppings like bacon, cheese, or fried items. Ask for the dressing on the side, or ask for olive oil and vinegar or lemon to use as dressing.  Estimate how many servings of a food you are given. For example, a serving of cooked rice is  cup or about the size of half a baseball. Knowing serving sizes will help you be aware of how much food you are eating at restaurants. The list below tells you how big or small some common portion sizes are based on everyday objects: ? 1 oz-4 stacked dice. ? 3 oz-1 deck of cards. ? 1 tsp-1 die. ? 1 Tbsp- a ping-pong ball. ? 2 Tbsp-1 ping-pong ball. ?  cup- baseball. ? 1 cup-1 baseball. Summary  Calorie counting means keeping track of how many calories you eat and drink each day. If you eat fewer calories than your body needs, you should lose weight.  A healthy amount of weight to lose per week is usually 1-2 lb (0.5-0.9 kg). This usually means reducing your daily calorie intake by 500-750 calories.  The number of calories in a food can be found on a Nutrition Facts label. If a food does not have a Nutrition Facts label, try to look up the calories online or ask your dietitian for help.  Use your calories on foods and drinks that will fill you up, and not on foods and drinks that will leave you hungry.  Use smaller plates, glasses, and bowls to prevent overeating. This information is not intended to replace advice given to you by your health care provider. Make sure you discuss any questions you have with your health care provider. Document Released: 06/14/2005 Document Revised: 05/14/2016 Document Reviewed: 05/14/2016 Elsevier Interactive Patient Education  2017 [employer].

## 2017-04-18 NOTE — Progress Notes (Signed)
Patient ID: Abbagayle Zaragoza, female    DOB: [birth year], 36 y.o.   MRN: [id number]  PCP: Scot Jun, FNP  Chief Complaint  Patient presents with  . Establish Care  . Weight Loss    Subjective:  HPI Jamoni Broadfoot is a 36 y.o. female presents to establish care and weight loss management. Medical history significant for obesity, adrenal mass, vitamin D deficiency, hypertension, depression, diverticulitis, and  cholecystectomy. She last saw a PCP in early 2018, during the visit, her labs were stable and she was treated for iron deficiency. She takes progesterone tablets for oral contraception. Patient's last menstrual period was 12/17/2016.  She reports missed periods since July. Denies any cramping or PMS symptoms. Reports that her estrogen level has been elevated in the past and therefore she was placed in progesterone OCP.Heydy reports compliance with antihypertension medications and good blood pressure control. She occasional monitors blood pressure, although not consistently. Denies chest pain, shortness of breath, dizziness, or chronic headaches. She has chronic lower extremity swelling, with right extremity worst than left. Wilhemina suffered from diverticulitis in 2016 and at time a CT of abdomen revealed an right adrenal mass measuring 2.7 cm for which repeat imaging was recommended, however she has never follow-up. Adrenal mass was suspicious for that of an adrenal adenoma. Stormie is very concerned today regarding weight. Her current body mass index is 45.32 kg/m.Marland Kitchen She reports multiple medication and dietary restriction attempts at weight loss with minimal to no success. The last medication patient was initially prescribed Belviq for appetite suppression, which she reports discontinuing after a period of time of not achieving any weight loss. She reports chronic depression and relates her poor mood to her inability to loose weight.  Reports eating high calorie snacks when stressed or upset  and endorses a poor diet. She is overall sedentary and engages in no routine physical activity. She is interested in starting Liraglutide for weight loss management. She denies an known family per personal history of thyroid, pancreatitis, or endocrine disease.  Social History   Social History  . Marital status: Single    Spouse name: N/A  . Number of children: N/A  . Years of education: N/A   Occupational History  . Not on file.   Social History Main Topics  . Smoking status: Never Smoker  . Smokeless tobacco: Never Used  . Alcohol use No  . Drug use: No  . Sexual activity: Yes    Birth control/ protection: None   Other Topics Concern  . Not on file   Social History Narrative  . No narrative on file    Family History  Problem Relation Age of Onset  . Hypertension Father    Review of Systems  Constitutional: Negative for fever, chills, diaphoresis, activity change, appetite change and fatigue. HENT: Negative for ear pain, nosebleeds, congestion, facial swelling, rhinorrhea, neck pain, neck stiffness and ear discharge.  Eyes: Negative for pain, discharge, redness, itching and visual disturbance. Respiratory: Negative for cough, choking, chest tightness, shortness of breath, wheezing and stridor.  Cardiovascular: Negative for chest pain, palpitations and leg swelling. Gastrointestinal: Negative for abdominal distention. Genitourinary: Negative for dysuria, urgency, frequency, hematuria, flank pain, decreased urine volume, difficulty urinating and dyspareunia.  Musculoskeletal: Negative for back pain, joint swelling, arthralgia and gait problem. Neurological: Negative for dizziness, tremors, seizures, syncope, facial asymmetry, speech difficulty, weakness, light-headedness, numbness and headaches.  Hematological: Negative for adenopathy. Does not bruise/bleed easily. Psychiatric/Behavioral: Positive for depression, Decreased concentration and agitation.  Patient  Active Problem  List   Diagnosis Date Noted  . Diverticulitis of colon 08/09/2014  . Abdominal pain 08/09/2014  . Adrenal adenoma 08/09/2014  . Diverticulitis 08/09/2014  . Elevated transaminase level 08/09/2014    Allergies  Allergen Reactions  . Ciprofloxacin     Patient started sweating and becoming clammy when was infused with IV cipro on 08/09/14.    Prior to Admission medications   Medication Sig Start Date End Date Taking? Authorizing Provider  ATENOLOL PO Take 25 mg by mouth.   Yes [provider]  progesterone (PROMETRIUM) 100 MG capsule Take 200 mg by mouth daily.    Yes [provider]  RaNITidine HCl (RANITIDINE 150 MAX STRENGTH PO) Take by mouth.   Yes [provider]  buPROPion (WELLBUTRIN XL) 150 MG 24 hr tablet Take 150 mg by mouth daily.    [provider]  HYDROcodone-acetaminophen (VICODIN) 5-500 MG per tablet Take 1 tablet by mouth every 6 (six) hours as needed for pain. Patient not taking: Reported on 02/03/2015 08/10/14   Rama, Venetia Maxon, MD    Past Medical, Surgical Family and Social History reviewed and updated.    Objective:   Today's Vitals   04/18/17 0929  BP: 134/87  Pulse: 77  Temp: 97.8 F (36.6 C)  TempSrc: Oral  SpO2: 100%  Weight: 264 lb (119.7 kg)  Height: 5\' 4"  (1.626 m)    Wt Readings from Last 3 Encounters:  04/18/17 264 lb (119.7 kg)  02/03/15 243 lb (110.2 kg)  08/09/14 227 lb 1.6 oz (103 kg)   Physical Exam  Constitutional: She is oriented to person, place, and time. She appears well-developed and well-nourished.  HENT:  Head: Normocephalic and atraumatic.  Eyes: Pupils are equal, round, and reactive to light. Conjunctivae and EOM are normal.  Neck: Normal range of motion. Neck supple. No thyromegaly present.  Cardiovascular: Normal rate, regular rhythm, normal heart sounds and intact distal pulses.   Pulmonary/Chest: Effort normal and breath sounds normal.  Abdominal: Soft. Bowel sounds are normal. She  exhibits no distension. There is no tenderness. There is no rebound.  Musculoskeletal: She exhibits edema.  Trace edema bilateral extremities   Lymphadenopathy:    She has no cervical adenopathy.  Neurological: She is alert and oriented to person, place, and time.  Skin: Skin is warm and dry.  Psychiatric: She has a normal mood and affect. Her behavior is normal. Judgment and thought content normal.   Assessment & Plan:  1. Adrenal mass, right ([place]), I have ordered a repeat CT of the abdomen to reevaluate the adrenal mass.  If mass remains present and or diameter has increased will refer to endocrinology for further follow-up and management.  2. Screening, lipid, lipid panel pending. 3. Amenorrhea, progesterone and estrogen pending . 4. Screening for diabetes mellitus , CMP pending.  5. Class 3 severe obesity without serious comorbidity with body mass index (BMI) of 45.0 to 49.9 in adult, unspecified obesity type [facility]), patient advised pending the outcome of the CT of the abdomen related to the adrenal mass will dictate whether or not uncomfortable with prescribing the Liraglutide for weight loss management.  Therefore will withhold prescribing today.  Reinforced that patient will need to dedicate herself to proper nutritional management as well as gradually introducing a physical activity routine in order to reduce weight and maintain weight loss.  Orders Placed This Encounter  Procedures  . CT ABDOMEN PELVIS W CONTRAST  . Lipid panel  . Thyroid Panel  With TSH  . COMPLETE METABOLIC PANEL WITH GFR  . CBC with Differential  . Estrogens, Total  . Progesterone  . POCT urinalysis dip (device)  . POCT urine pregnancy    Meds ordered this encounter  Medications  . RaNITidine HCl (RANITIDINE 150 MAX STRENGTH PO)    Sig: Take by mouth.   Return for care in 1 month. Will check a hemoglobin A1c , evaluate for the return of menses, and EKG.   Carroll Sage. Kenton Kingfisher, MSN, FNP-C [facility]  [place]  [address] [place], [place] [address] [phone]

## 2017-04-18 NOTE — Telephone Encounter (Signed)
Please schedule CT of abdomen Pelvis re-evaluate right adrenal mass noted on CT from 2016

## 2017-04-19 MED FILL — PROGESTERONE 100 MG CAPSULE: 100 | 30 days supply | Qty: 30 | Fill #0

## 2017-04-19 MED FILL — ATENOLOL 25 MG TABLET: 25 | 30 days supply | Qty: 30 | Fill #0

## 2017-04-21 ENCOUNTER — Telehealth: Payer: Self-pay | Admitting: Family Medicine

## 2017-04-21 LAB — ESTROGENS, TOTAL: Estrogen: 357.6 pg/mL

## 2017-04-21 LAB — PROGESTERONE: PROGESTERONE: 9.6 ng/mL

## 2017-04-21 NOTE — Telephone Encounter (Signed)
Patient was scheduled for the 30th and states that date doesn't work and she will call and reschedule.

## 2017-04-21 NOTE — Telephone Encounter (Signed)
Contact patient to advise all labs were as expected.  Unable to run an estradiol level this will have to be repeated at her follow-up.  Should continue with the CT of the abdomen and she will be notified once the results.

## 2017-04-22 NOTE — Telephone Encounter (Signed)
Left a vm for patient to callback 

## 2017-04-22 NOTE — Telephone Encounter (Signed)
Patient would like to know if a A1C can be added on to her blood work

## 2017-04-22 NOTE — Telephone Encounter (Signed)
A1C will have to be checked at next follow-up.

## 2017-04-25 NOTE — Telephone Encounter (Signed)
Patient notified

## 2017-04-25 NOTE — Telephone Encounter (Signed)
Left a vm for patient to callback 

## 2017-04-26 ENCOUNTER — Ambulatory Visit (HOSPITAL_COMMUNITY): Payer: 59

## 2017-04-29 ENCOUNTER — Ambulatory Visit (HOSPITAL_COMMUNITY): Admission: RE | Admit: 2017-04-29 | Payer: 59 | Source: Ambulatory Visit

## 2017-04-29 ENCOUNTER — Ambulatory Visit: Payer: Self-pay | Admitting: *Deleted

## 2017-05-10 ENCOUNTER — Encounter (HOSPITAL_COMMUNITY): Payer: Self-pay

## 2017-05-10 ENCOUNTER — Other Ambulatory Visit: Payer: Self-pay | Admitting: Family Medicine

## 2017-05-10 ENCOUNTER — Ambulatory Visit (HOSPITAL_COMMUNITY)
Admission: RE | Admit: 2017-05-10 | Discharge: 2017-05-10 | Disposition: A | Discharge status: Home / Self Care | Payer: 59 | Source: Ambulatory Visit | Attending: Family Medicine | Admitting: Family Medicine

## 2017-05-10 ENCOUNTER — Ambulatory Visit: Payer: Self-pay | Admitting: *Deleted

## 2017-05-10 DIAGNOSIS — K5730 Diverticulosis of large intestine without perforation or abscess without bleeding: Secondary | ICD-10-CM | POA: Insufficient documentation

## 2017-05-10 DIAGNOSIS — D3501 Benign neoplasm of right adrenal gland: Secondary | ICD-10-CM | POA: Diagnosis not present

## 2017-05-10 DIAGNOSIS — K760 Fatty (change of) liver, not elsewhere classified: Secondary | ICD-10-CM | POA: Insufficient documentation

## 2017-05-10 DIAGNOSIS — E278 Other specified disorders of adrenal gland: Secondary | ICD-10-CM

## 2017-05-10 DIAGNOSIS — E279 Disorder of adrenal gland, unspecified: Secondary | ICD-10-CM | POA: Diagnosis present

## 2017-05-10 IMAGING — CT CT ABDOMEN W/O CM
2 of 4 series · 15 of 46 positions shown, 17 images · non-contrast
Comparison: CT the abdomen and pelvis [DATE].

CLINICAL DATA: 36-year-old female with history of incidental
adrenal mass noted on prior CT examination. Followup study.

EXAM:
CT ABDOMEN WITHOUT CONTRAST
TECHNIQUE: Multidetector CT imaging of the abdomen was performed following the
standard protocol without IV contrast.

[Series 2: axial st · axial · 0.88mm/px · z∈[-302,-32]mm · 12 of 101 slices shown, 14 images]
[im 6/101  soft-tissue]
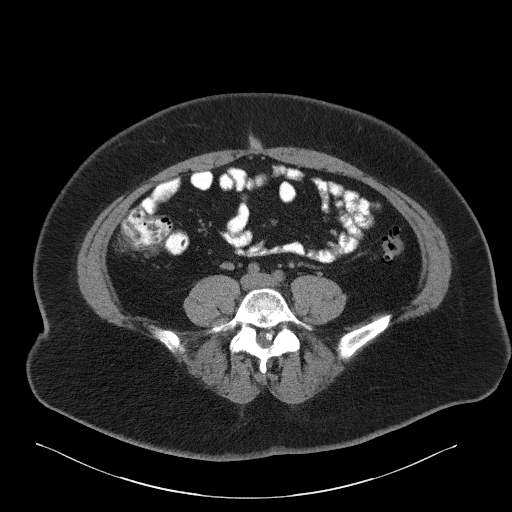
[im 6/101  bone]
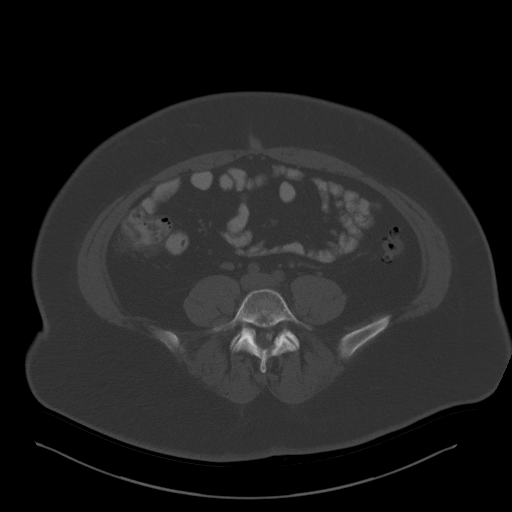
[im 16/101  soft-tissue]
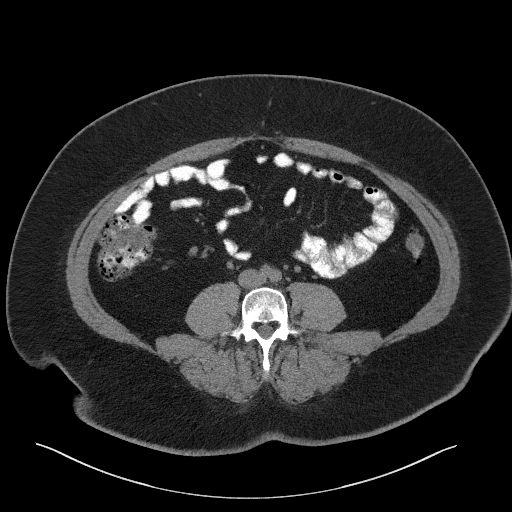
[im 21/101  soft-tissue]
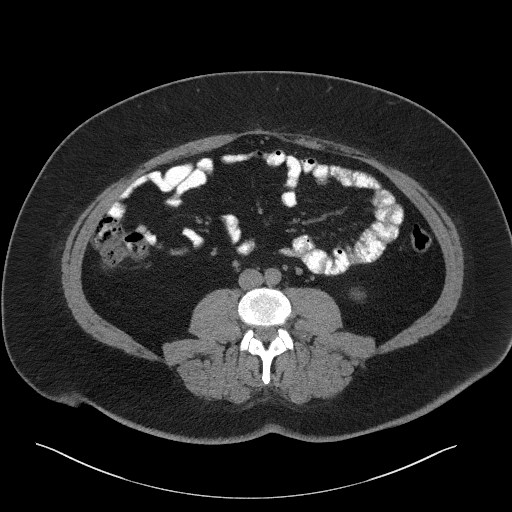
[im 31/101  soft-tissue]
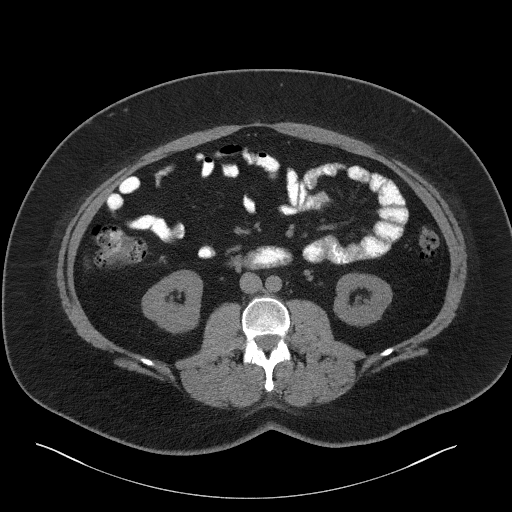
[im 41/101  soft-tissue]
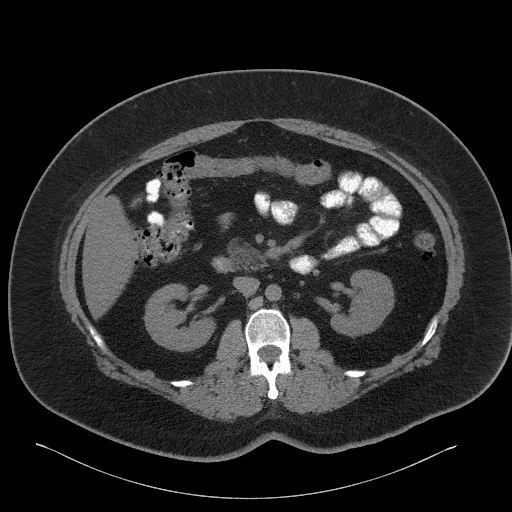
[im 46/101  soft-tissue]
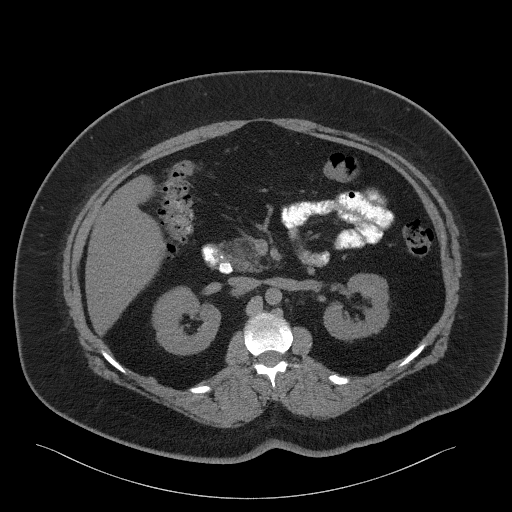
[im 56/101  soft-tissue]
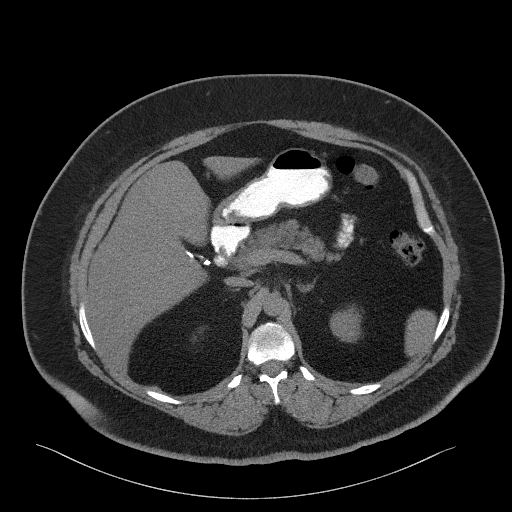
[im 61/101  soft-tissue]
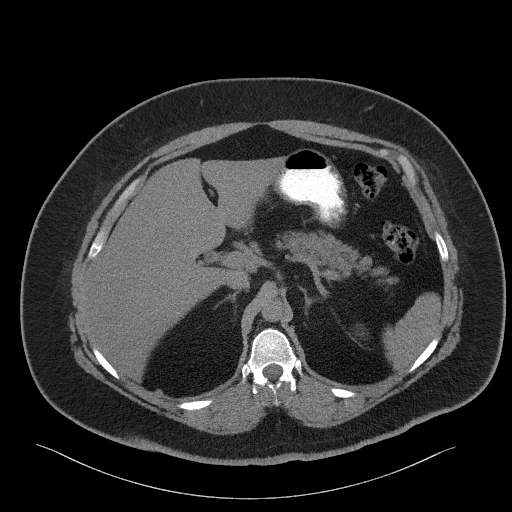
[im 71/101  soft-tissue]
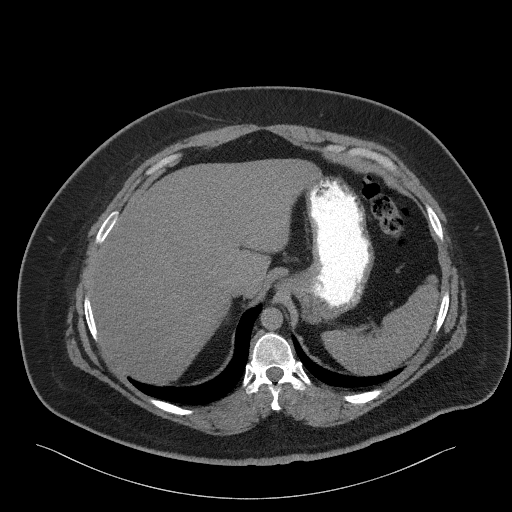
[im 71/101  bone]
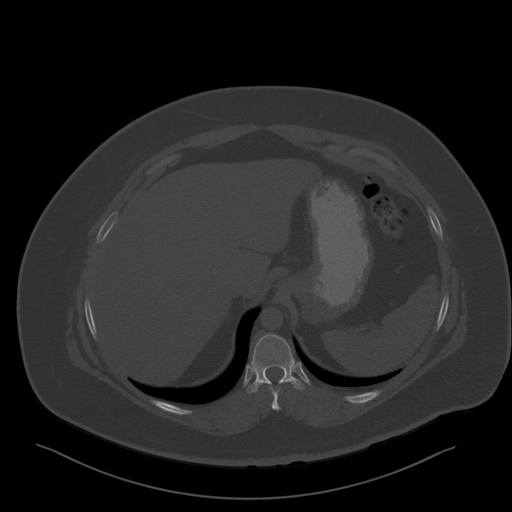
[im 81/101  soft-tissue]
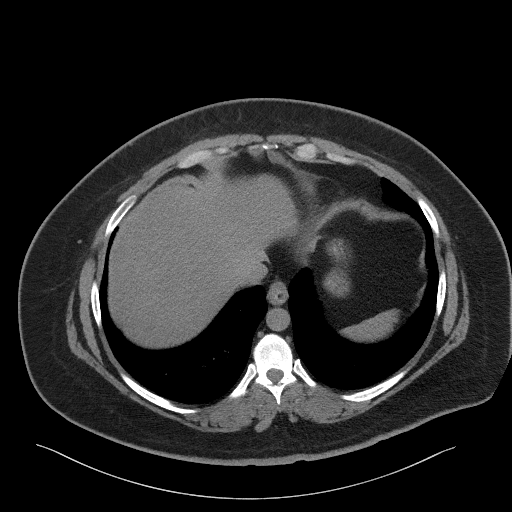
[im 86/101  soft-tissue]
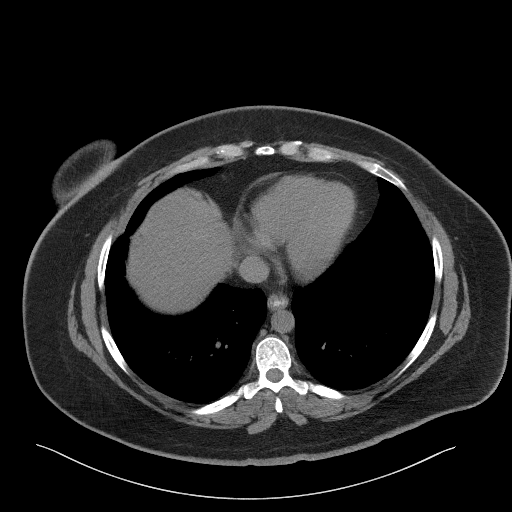
[im 96/101  soft-tissue]
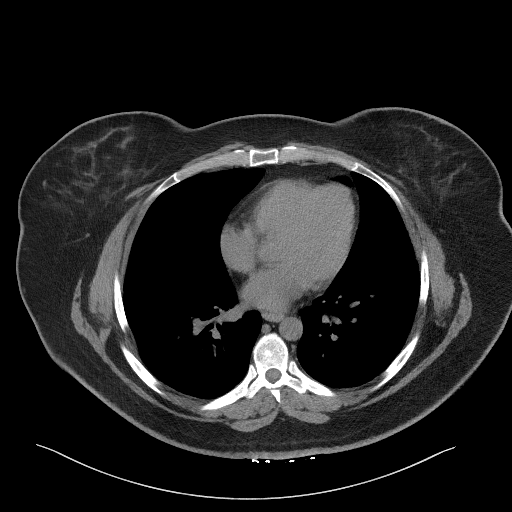

[Series 4: coronal wo · coronal · 0.60mm/px · 3 of 156 slices shown]
[im 52/156  soft-tissue]
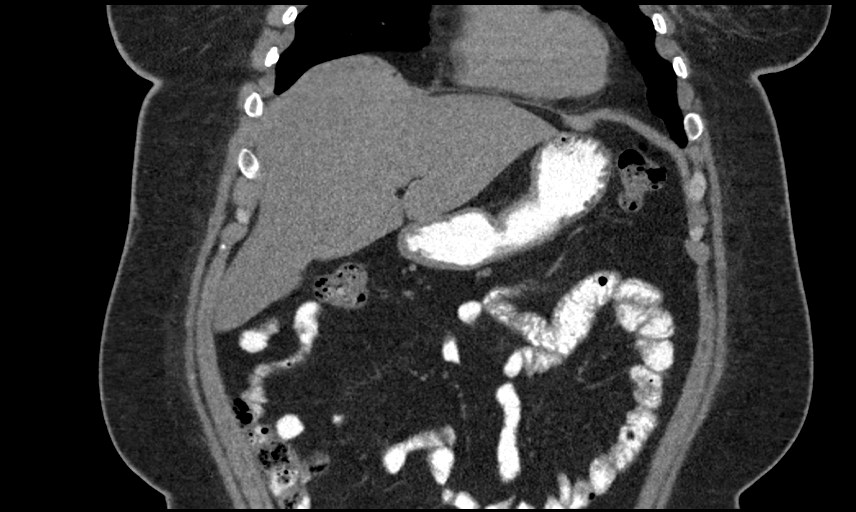
[im 69/156  soft-tissue]
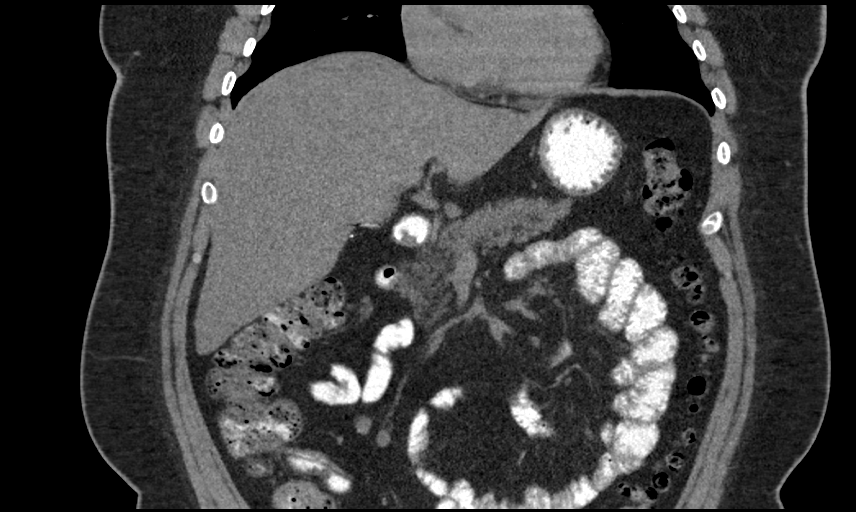
[im 87/156  soft-tissue]
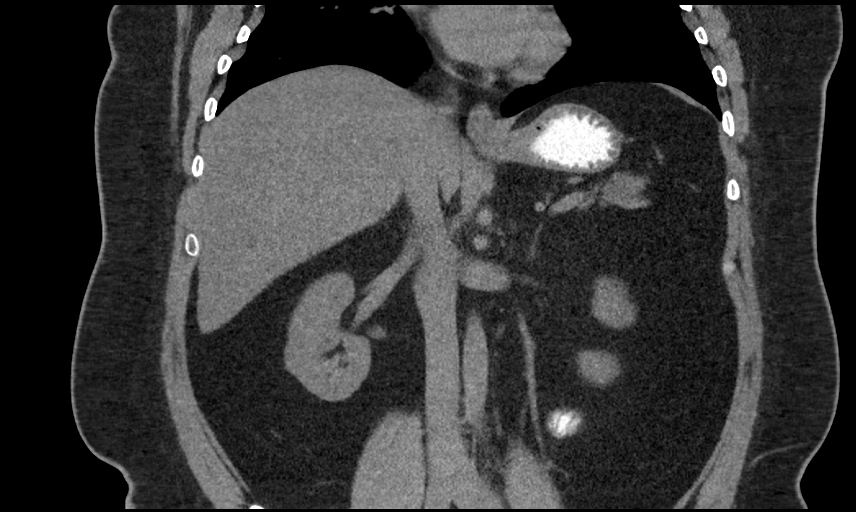

[15 of 46 positions shown; findings below may reference images not displayed]

FINDINGS: Lower chest: Unremarkable.

Hepatobiliary: Diffuse low attenuation throughout the hepatic
parenchyma, indicative of hepatic steatosis. No definite cystic or
solid hepatic lesions are noted on today's noncontrast CT
examination. Status post cholecystectomy.

Pancreas: No definite pancreatic mass or peripancreatic inflammatory
changes noted on today's noncontrast CT examination.

Spleen: Unremarkable.

Adrenals/Urinary Tract: Previously noted right adrenal mass is
similar to the prior examination measuring 3.0 x 2.6 cm and is
diffusely low-attenuation (0 HU), compatible with an adenoma. Left
adrenal gland and bilateral kidneys are normal in appearance. No
hydroureteronephrosis in the visualized portions of the abdomen.

Stomach/Bowel: Normal appearance of the stomach. No pathologic
dilatation of visualized portions of small bowel or colon. Numerous
colonic diverticulae are noted particularly in the descending colon.

Vascular/Lymphatic: No atherosclerotic calcifications in the
abdominal aorta. No definite aneurysm noted in the abdomen. No
lymphadenopathy noted in the abdomen.

Other: No significant volume of ascites and no pneumoperitoneum
noted in the visualized portions of the peritoneal cavity.

Musculoskeletal: There are no aggressive appearing lytic or blastic
lesions noted in the visualized portions of the skeleton.
IMPRESSION: 1. Right adrenal adenoma redemonstrated.
2. Hepatic steatosis.
3. Colonic diverticulosis without findings to suggest an acute
diverticulitis at this time.

## 2017-05-11 ENCOUNTER — Telehealth: Payer: Self-pay | Admitting: Family Medicine

## 2017-05-11 NOTE — Telephone Encounter (Signed)
Contact patient to advise her most CT scan resulted as follows:  Adrenal mass was consisted with adrenal adenoma which are usually benign adrenal masses. I will refer her to endocrinology for a second opinion. CT also revealed fatty liver disease which managed with weight loss, exercise, and dietary modification.   She has a follow-up scheduled with me on 05/24/2017 and during that visit we will discuss likely initiate Saxenda at that time.   Carroll Sage. Kenton Kingfisher, MSN, FNP-C [facility] [place]  [address] [place], [place] [address] [phone]

## 2017-05-12 NOTE — Telephone Encounter (Signed)
Patient notified and will wait to hear from the endocrinologist

## 2017-05-18 ENCOUNTER — Telehealth: Payer: Self-pay

## 2017-05-18 NOTE — Telephone Encounter (Signed)
Patient wanted to follow up on the referral for the endocrinologist. Patient is aware that you are out of office until Monday. Patient wants to be referred to a place in [place].

## 2017-05-20 MED FILL — ATENOLOL 25 MG TABLET: 25 | 30 days supply | Qty: 30 | Fill #1

## 2017-05-20 MED FILL — PROGESTERONE 100 MG CAPSULE: 100 | 30 days supply | Qty: 30 | Fill #1

## 2017-05-24 ENCOUNTER — Encounter: Payer: Self-pay | Admitting: Family Medicine

## 2017-05-24 ENCOUNTER — Ambulatory Visit: Payer: 59 | Admitting: Family Medicine

## 2017-05-24 VITALS — BP 120/80 | HR 78 | Temp 98.3°F | Resp 16 | Ht 64.0 in | Wt 265.0 lb

## 2017-05-24 DIAGNOSIS — R7309 Other abnormal glucose: Secondary | ICD-10-CM

## 2017-05-24 DIAGNOSIS — E279 Disorder of adrenal gland, unspecified: Secondary | ICD-10-CM

## 2017-05-24 DIAGNOSIS — E278 Other specified disorders of adrenal gland: Secondary | ICD-10-CM

## 2017-05-24 LAB — POCT GLYCOSYLATED HEMOGLOBIN (HGB A1C): HEMOGLOBIN A1C: 5.4

## 2017-05-24 MED ORDER — LIRAGLUTIDE -WEIGHT MANAGEMENT 18 MG/3ML ~~LOC~~ SOPN: PEN_INJECTOR | SUBCUTANEOUS | 4 refills | Status: DC

## 2017-05-24 MED ORDER — INSULIN PEN NEEDLE 29G X 12.7MM MISC: 2 refills | Status: DC

## 2017-05-24 NOTE — Patient Instructions (Addendum)
Start liraglutide 0.6 mg daily subcutaneously x 1 week. Week 2 increase to 1.2 mg daily x 1 week. Week 3 increase to 2.4 mg daily x 1 week. Week 4 increase to 3 mg daily.  Continue to reduce calories with an aim of 1800 -2000 calories per day.   Increase physical activity with a goal of 150 minutes per week    Calorie Counting for Weight Loss Calories are units of energy. Your body needs a certain amount of calories from food to keep you going throughout the day. When you eat more calories than your body needs, your body stores the extra calories as fat. When you eat fewer calories than your body needs, your body burns fat to get the energy it needs. Calorie counting means keeping track of how many calories you eat and drink each day. Calorie counting can be helpful if you need to lose weight. If you make sure to eat fewer calories than your body needs, you should lose weight. Ask your health care provider what a healthy weight is for you. For calorie counting to work, you will need to eat the right number of calories in a day in order to lose a healthy amount of weight per week. A dietitian can help you determine how many calories you need in a day and will give you suggestions on how to reach your calorie goal.  A healthy amount of weight to lose per week is usually 1-2 lb (0.5-0.9 kg). This usually means that your daily calorie intake should be reduced by 500-750 calories.  Eating 1,200 - 1,500 calories per day can help most women lose weight.  Eating 1,500 - 1,800 calories per day can help most men lose weight.  What is my plan? My goal is to have __________ calories per day. If I have this many calories per day, I should lose around __________ pounds per week. What do I need to know about calorie counting? In order to meet your daily calorie goal, you will need to:  Find out how many calories are in each food you would like to eat. Try to do this before you eat.  Decide how much of  the food you plan to eat.  Write down what you ate and how many calories it had. Doing this is called keeping a food log.  To successfully lose weight, it is important to balance calorie counting with a healthy lifestyle that includes regular activity. Aim for 150 minutes of moderate exercise (such as walking) or 75 minutes of vigorous exercise (such as running) each week. Where do I find calorie information?  The number of calories in a food can be found on a Nutrition Facts label. If a food does not have a Nutrition Facts label, try to look up the calories online or ask your dietitian for help. Remember that calories are listed per serving. If you choose to have more than one serving of a food, you will have to multiply the calories per serving by the amount of servings you plan to eat. For example, the label on a package of bread might say that a serving size is 1 slice and that there are 90 calories in a serving. If you eat 1 slice, you will have eaten 90 calories. If you eat 2 slices, you will have eaten 180 calories. How do I keep a food log? Immediately after each meal, record the following information in your food log:  What you ate. Don't forget to include  toppings, sauces, and other extras on the food.  How much you ate. This can be measured in cups, ounces, or number of items.  How many calories each food and drink had.  The total number of calories in the meal.  Keep your food log near you, such as in a small notebook in your pocket, or use a mobile app or website. Some programs will calculate calories for you and show you how many calories you have left for the day to meet your goal. What are some calorie counting tips?  Use your calories on foods and drinks that will fill you up and not leave you hungry: ? Some examples of foods that fill you up are nuts and nut butters, vegetables, lean proteins, and high-fiber foods like whole grains. High-fiber foods are foods with more than  5 g fiber per serving. ? Drinks such as sodas, specialty coffee drinks, alcohol, and juices have a lot of calories, yet do not fill you up.  Eat nutritious foods and avoid empty calories. Empty calories are calories you get from foods or beverages that do not have many vitamins or protein, such as candy, sweets, and soda. It is better to have a nutritious high-calorie food (such as an avocado) than a food with few nutrients (such as a bag of chips).  Know how many calories are in the foods you eat most often. This will help you calculate calorie counts faster.  Pay attention to calories in drinks. Low-calorie drinks include water and unsweetened drinks.  Pay attention to nutrition labels for "low fat" or "fat free" foods. These foods sometimes have the same amount of calories or more calories than the full fat versions. They also often have added sugar, starch, or salt, to make up for flavor that was removed with the fat.  Find a way of tracking calories that works for you. Get creative. Try different apps or programs if writing down calories does not work for you. What are some portion control tips?  Know how many calories are in a serving. This will help you know how many servings of a certain food you can have.  Use a measuring cup to measure serving sizes. You could also try weighing out portions on a kitchen scale. With time, you will be able to estimate serving sizes for some foods.  Take some time to put servings of different foods on your favorite plates, bowls, and cups so you know what a serving looks like.  Try not to eat straight from a bag or box. Doing this can lead to overeating. Put the amount you would like to eat in a cup or on a plate to make sure you are eating the right portion.  Use smaller plates, glasses, and bowls to prevent overeating.  Try not to multitask (for example, watch TV or use your computer) while eating. If it is time to eat, sit down at a table and enjoy  your food. This will help you to know when you are full. It will also help you to be aware of what you are eating and how much you are eating. What are tips for following this plan? Reading food labels  Check the calorie count compared to the serving size. The serving size may be smaller than what you are used to eating.  Check the source of the calories. Make sure the food you are eating is high in vitamins and protein and low in saturated and trans fats. Shopping  Read nutrition labels while you shop. This will help you make healthy decisions before you decide to purchase your food.  Make a grocery list and stick to it. Cooking  Try to cook your favorite foods in a healthier way. For example, try baking instead of frying.  Use low-fat dairy products. Meal planning  Use more fruits and vegetables. Half of your plate should be fruits and vegetables.  Include lean proteins like poultry and fish. How do I count calories when eating out?  Ask for smaller portion sizes.  Consider sharing an entree and sides instead of getting your own entree.  If you get your own entree, eat only half. Ask for a box at the beginning of your meal and put the rest of your entree in it so you are not tempted to eat it.  If calories are listed on the menu, choose the lower calorie options.  Choose dishes that include vegetables, fruits, whole grains, low-fat dairy products, and lean protein.  Choose items that are boiled, broiled, grilled, or steamed. Stay away from items that are buttered, battered, fried, or served with cream sauce. Items labeled "crispy" are usually fried, unless stated otherwise.  Choose water, low-fat milk, unsweetened iced tea, or other drinks without added sugar. If you want an alcoholic beverage, choose a lower calorie option such as a glass of wine or light beer.  Ask for dressings, sauces, and syrups on the side. These are usually high in calories, so you should limit the  amount you eat.  If you want a salad, choose a garden salad and ask for grilled meats. Avoid extra toppings like bacon, cheese, or fried items. Ask for the dressing on the side, or ask for olive oil and vinegar or lemon to use as dressing.  Estimate how many servings of a food you are given. For example, a serving of cooked rice is  cup or about the size of half a baseball. Knowing serving sizes will help you be aware of how much food you are eating at restaurants. The list below tells you how big or small some common portion sizes are based on everyday objects: ? 1 oz-4 stacked dice. ? 3 oz-1 deck of cards. ? 1 tsp-1 die. ? 1 Tbsp- a ping-pong ball. ? 2 Tbsp-1 ping-pong ball. ?  cup- baseball. ? 1 cup-1 baseball. Summary  Calorie counting means keeping track of how many calories you eat and drink each day. If you eat fewer calories than your body needs, you should lose weight.  A healthy amount of weight to lose per week is usually 1-2 lb (0.5-0.9 kg). This usually means reducing your daily calorie intake by 500-750 calories.  The number of calories in a food can be found on a Nutrition Facts label. If a food does not have a Nutrition Facts label, try to look up the calories online or ask your dietitian for help.  Use your calories on foods and drinks that will fill you up, and not on foods and drinks that will leave you hungry.  Use smaller plates, glasses, and bowls to prevent overeating. This information is not intended to replace advice given to you by your health care provider. Make sure you discuss any questions you have with your health care provider. Document Released: 06/14/2005 Document Revised: 05/14/2016 Document Reviewed: 05/14/2016 Elsevier Interactive Patient Education  2017 [place]. Liraglutide injection What is this medicine? LIRAGLUTIDE (LIR a GLOO tide) is used to improve blood sugar control in adults  with type 2 diabetes. This medicine may be used with other  diabetes medicines. This drug may also reduce the risk of heart attack or stroke if you have type 2 diabetes and risk factors for heart disease. This medicine may be used for other purposes; ask your health care provider or pharmacist if you have questions. COMMON BRAND NAME(S): Victoza What should I tell my health care provider before I take this medicine? They need to know if you have any of these conditions: -endocrine tumors (MEN 2) or if someone in your family had these tumors -gallbladder disease -high cholesterol -history of alcohol abuse problem -history of pancreatitis -kidney disease or if you are on dialysis -liver disease -previous swelling of the tongue, face, or lips with difficulty breathing, difficulty swallowing, hoarseness, or tightening of the throat -stomach problems -thyroid cancer or if someone in your family had thyroid cancer -an unusual or allergic reaction to liraglutide, other medicines, foods, dyes, or preservatives -pregnant or trying to get pregnant -breast-feeding How should I use this medicine? This medicine is for injection under the skin of your upper leg, stomach area, or upper arm. You will be taught how to prepare and give this medicine. Use exactly as directed. Take your medicine at regular intervals. Do not take it more often than directed. It is important that you put your used needles and syringes in a special sharps container. Do not put them in a trash can. If you do not have a sharps container, call your pharmacist or healthcare provider to get one. A special MedGuide will be given to you by the pharmacist with each prescription and refill. Be sure to read this information carefully each time. Talk to your pediatrician regarding the use of this medicine in children. Special care may be needed. Overdosage: If you think you have taken too much of this medicine contact a poison control center or emergency room at once. NOTE: This medicine is only for  you. Do not share this medicine with others. What if I miss a dose? If you miss a dose, take it as soon as you can. If it is almost time for your next dose, take only that dose. Do not take double or extra doses. What may interact with this medicine? -other medicines for diabetes Many medications may cause changes in blood sugar, these include: -alcohol containing beverages -antiviral medicines for HIV or AIDS -aspirin and aspirin-like drugs -certain medicines for blood pressure, heart disease, irregular heart beat -chromium -diuretics -female hormones, such as estrogens or progestins, birth control pills -fenofibrate -gemfibrozil -isoniazid -lanreotide -female hormones or anabolic steroids -MAOIs like Carbex, Eldepryl, Marplan, Nardil, and Parnate -medicines for weight loss -medicines for allergies, asthma, cold, or cough -medicines for depression, anxiety, or psychotic disturbances -niacin -nicotine -NSAIDs, medicines for pain and inflammation, like ibuprofen or naproxen -octreotide -pasireotide -pentamidine -phenytoin -probenecid -quinolone antibiotics such as ciprofloxacin, levofloxacin, ofloxacin -some herbal dietary supplements -steroid medicines such as prednisone or cortisone -sulfamethoxazole; trimethoprim -thyroid hormones Some medications can hide the warning symptoms of low blood sugar (hypoglycemia). You may need to monitor your blood sugar more closely if you are taking one of these medications. These include: -beta-blockers, often used for high blood pressure or heart problems (examples include atenolol, metoprolol, propranolol) -clonidine -guanethidine -reserpine This list may not describe all possible interactions. Give your health care provider a list of all the medicines, herbs, non-prescription drugs, or dietary supplements you use. Also tell them if you smoke, drink alcohol, or use illegal drugs.  Some items may interact with your medicine. What should I  watch for while using this medicine? Visit your doctor or health care professional for regular checks on your progress. Drink plenty of fluids while taking this medicine. Check with your doctor or health care professional if you get an attack of severe diarrhea, nausea, and vomiting. The loss of too much body fluid can make it dangerous for you to take this medicine. A test called the HbA1C (A1C) will be monitored. This is a simple blood test. It measures your blood sugar control over the last 2 to 3 months. You will receive this test every 3 to 6 months. Learn how to check your blood sugar. Learn the symptoms of low and high blood sugar and how to manage them. Always carry a quick-source of sugar with you in case you have symptoms of low blood sugar. Examples include hard sugar candy or glucose tablets. Make sure others know that you can choke if you eat or drink when you develop serious symptoms of low blood sugar, such as seizures or unconsciousness. They must get medical help at once. Tell your doctor or health care professional if you have high blood sugar. You might need to change the dose of your medicine. If you are sick or exercising more than usual, you might need to change the dose of your medicine. Do not skip meals. Ask your doctor or health care professional if you should avoid alcohol. Many nonprescription cough and cold products contain sugar or alcohol. These can affect blood sugar. Pens should never be shared. Even if the needle is changed, sharing may result in passing of viruses like hepatitis or HIV. Wear a medical ID bracelet or chain, and carry a card that describes your disease and details of your medicine and dosage times. What side effects may I notice from receiving this medicine? Side effects that you should report to your doctor or health care professional as soon as possible: -allergic reactions like skin rash, itching or hives, swelling of the face, lips, or  tongue -breathing problems -diarrhea that continues or is severe -lump or swelling on the neck -severe nausea -signs and symptoms of infection like fever or chills; cough; sore throat; pain or trouble passing urine -signs and symptoms of low blood sugar such as feeling anxious, confusion, dizziness, increased hunger, unusually weak or tired, sweating, shakiness, cold, irritable, headache, blurred vision, fast heartbeat, loss of consciousness -signs and symptoms of kidney injury like trouble passing urine or change in the amount of urine -trouble swallowing -unusual stomach upset or pain -vomiting Side effects that usually do not require medical attention (report to your doctor or health care professional if they continue or are bothersome): -constipation -decreased appetite -diarrhea -fatigue -headache -nausea -pain, redness, or irritation at site where injected -stomach upset -stuffy or runny nose This list may not describe all possible side effects. Call your doctor for medical advice about side effects. You may report side effects to FDA at 1-800-FDA-1088. Where should I keep my medicine? Keep out of the reach of children. Store unopened pen in a refrigerator between 2 and 8 degrees C (36 and 46 degrees F). Do not freeze or use if the medicine has been frozen. Protect from light and excessive heat. After you first use the pen, it can be stored at room temperature between 15 and 30 degrees C (59 and 86 degrees F) or in a refrigerator. Throw away your used pen after 30 days or after the expiration  date, whichever comes first. Do not store your pen with the needle attached. If the needle is left on, medicine may leak from the pen. NOTE: This sheet is a summary. It may not cover all possible information. If you have questions about this medicine, talk to your doctor, pharmacist, or health care provider.  2018 Elsevier/[employer] Standard (2016-07-01 14:39:40)

## 2017-05-24 NOTE — Progress Notes (Signed)
Patient ID: Paula King, female    DOB: 04-Sep-1980, 36 y.o.   MRN: [id number]  PCP: Scot Jun, FNP  Chief Complaint  Patient presents with  . Follow-up    Adrenal Mass and Weight Loss     Subjective:  HPI Paula King is a 36 y.o. female presents for weight loss management and adrenal mass.  Medical history significant for hypertension, morbid obesity, benign adrenal mass, and depression.  Last office visit 04/18/2017. During her last visit, she was referred to obtain a CT of abdomen without contrast to evaluate status of an adrenal mass noted initially on CT of abdomen 08/09/2014. Recent CT of the abdomen was significant for right adrenal adenoma, fatty liver disease and colonic diverticulitis. The adrenal mass unchanged from prior study, however Paula King wishes to obtain a second opinion from an endocrinologist as she expresses concern for having Cushing's syndrome. She has had no prior work-up, however, she is concerns that always feel bloated and her skin is tight. 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 Paula King also requests to precede with a trial of liraglutide to aid in  weight loss. She has previously tried Belviq in combination with dietary management without successful weigh loss. Current Body mass index is 45.49 kg/m. Paula King is physically inactive which she attributes to depressed mood and decreased motivation.  Paula King denies symptoms of shortness of breath, chest pain, dizziness, abdominal pain, nausea, or vomiting, suicidal ideation, or thoughts of harming others. Social History   Socioeconomic History  . Marital status: Single    Spouse name: Not on file  . Number of children: Not on file  . Years of education: Not on file  . Highest education level: Not on file  Social Needs  . Financial resource strain: Not on file  . Food insecurity - worry: Not on file  . Food insecurity - inability: Not on file  . Transportation needs - medical: Not on file  . Transportation  needs - non-medical: Not on file  Occupational History  . Not on file  Tobacco Use  . Smoking status: Never Smoker  . Smokeless tobacco: Never Used  Substance and Sexual Activity  . Alcohol use: No  . Drug use: No  . Sexual activity: Yes    Birth control/protection: None  Other Topics Concern  . Not on file  Social History Narrative  . Not on file    Family History  Problem Relation Age of Onset  . Hypertension Father    Review of Systems  Constitutional: Positive for fatigue.  HENT: Negative.   Eyes: Negative.   Respiratory: Negative.   Cardiovascular: Negative.   Gastrointestinal: Negative.   Endocrine: Negative.   Musculoskeletal: Negative.   Skin: Negative.   Neurological: Negative.   Hematological: Negative.   Psychiatric/Behavioral: Positive for decreased concentration and dysphoric mood.    Patient Active Problem List   Diagnosis Date Noted  . Diverticulitis of colon 08/09/2014  . Abdominal pain 08/09/2014  . Adrenal adenoma 08/09/2014  . Diverticulitis 08/09/2014  . Elevated transaminase level 08/09/2014    Allergies  Allergen Reactions  . Ciprofloxacin     IV Only-Patient started sweating and becoming clammy when was infused with IV cipro on 08/09/14. Can tolerate PO Ciprofloxacin     Prior to Admission medications   Medication Sig Start Date End Date Taking? Authorizing Provider  ATENOLOL PO Take 25 mg by mouth.   Yes [provider]  DiphenhydrAMINE HCl (BENADRYL PO) Take by mouth.   Yes [provider]  RaNITidine HCl (RANITIDINE 150 MAX STRENGTH PO) Take by mouth.   Yes [provider]  HYDROcodone-acetaminophen (VICODIN) 5-500 MG per tablet Take 1 tablet by mouth every 6 (six) hours as needed for pain. Patient not taking: Reported on 02/03/2015 08/10/14   Rama, Venetia Maxon, MD  progesterone (PROMETRIUM) 100 MG capsule Take 100 mg by mouth daily.     [provider]    Past Medical, Surgical Family and Social  History reviewed and updated.    Objective:   Today's Vitals   05/24/17 1500  BP: 120/80  Pulse: 78  Resp: 16  Temp: 98.3 F (36.8 C)  TempSrc: Oral  SpO2: 97%  Weight: 265 lb (120.2 kg)  Height: 5\' 4"  (1.626 m)    Wt Readings from Last 3 Encounters:  05/24/17 265 lb (120.2 kg)  04/18/17 264 lb (119.7 kg)  02/03/15 243 lb (110.2 kg)   Physical Exam Constitutional: She is oriented to person, place, and time. She appears well-developed and well-nourished.  HENT:  Head: Normocephalic and atraumatic.  Eyes: Pupils are equal, round, and reactive to light. Conjunctivae and EOM are normal.  Neck: Normal range of motion. Neck supple. No thyromegaly present.  Cardiovascular: Normal rate, regular rhythm, normal heart sounds and intact distal pulses.   Pulmonary/Chest: Effort normal and breath sounds normal.  Abdominal: Soft. Bowel sounds are normal. She exhibits no distension. There is no tenderness. There is no rebound.  Musculoskeletal: She exhibits edema.  Trace edema bilateral extremities   Lymphadenopathy:    She has no cervical adenopathy.  Neurological: She is alert and oriented to person, place, and time.  Skin: Skin is warm and dry.  Psychiatric: She has a normal mood and affect. Her behavior is normal. Judgment and thought content normal.   Assessment & Plan:  1. Adrenal mass ([name]), CT of abdomen is significant for adrenal adenoma . Referring patient to endocrinology for further evaluation and monitoring. At the request of patient, I will obtain a ACTH level to ensure level is within expected range.  2. Morbid obesity ([name]) Body mass index is 45.49 kg/m., Initiating weight loss management therapy with liraglutide . Start liraglutide 0.6 mg daily subcutaneously x 1 week. Week 2 increase to 1.2 mg daily x 1 week. Week 3 increase to 2.4 mg daily x 1 week. Week 4 increase to 3 mg daily. Continue to reduce calories with an aim of 1800 -2000 calories per day.  Increase physical  activity with a goal of 150 minutes per week. Return in 6 weeks for weight loss management evaluation.  3. Hemoglobin A1c less than 7.0%, A1C today 5.4 which is within normal range.  -Encouraged dietary management and routine physical activity for diabetes prevention  Meds ordered this encounter  Medications  . Liraglutide -Weight Management 18 MG/3ML SOPN    Sig: Inject 0.6 mg into the skin daily for 7 days, THEN 1.2 mg daily for 7 days, THEN 2.4 mg daily for 7 days, THEN 3 mg daily.    Dispense:  3 pen    Refill:  4    Order Specific Question:   Supervising Provider    Answer:   Tresa Garter [id number]  . Insulin Pen Needle (BD ULTRA-FINE PEN NEEDLES) 29G X 12.7MM MISC    Sig: ICD10 E11.9 for use with insulin administration    Dispense:  200 each    Refill:  2    Order Specific Question:   Supervising Provider    Answer:  Tresa Garter [[id number]]    Orders Placed This Encounter  Procedures  . ACTH  . Ambulatory referral to Endocrinology  . POCT glycosylated hemoglobin (Hb A1C)    Return for care in 6 weeks for weight loss management and evaluation.   Carroll Sage. Kenton Kingfisher, MSN, FNP-C [facility]  [address] [place], [place] [address] [phone]

## 2017-05-27 ENCOUNTER — Telehealth: Payer: Self-pay

## 2017-05-27 ENCOUNTER — Other Ambulatory Visit (INDEPENDENT_AMBULATORY_CARE_PROVIDER_SITE_OTHER): Payer: Self-pay | Admitting: Otolaryngology

## 2017-05-27 DIAGNOSIS — H9042 Sensorineural hearing loss, unilateral, left ear, with unrestricted hearing on the contralateral side: Secondary | ICD-10-CM | POA: Diagnosis not present

## 2017-05-27 DIAGNOSIS — J320 Chronic maxillary sinusitis: Secondary | ICD-10-CM

## 2017-05-27 DIAGNOSIS — H6982 Other specified disorders of Eustachian tube, left ear: Secondary | ICD-10-CM | POA: Diagnosis not present

## 2017-05-27 DIAGNOSIS — J310 Chronic rhinitis: Secondary | ICD-10-CM | POA: Diagnosis not present

## 2017-05-27 DIAGNOSIS — J343 Hypertrophy of nasal turbinates: Secondary | ICD-10-CM | POA: Diagnosis not present

## 2017-05-27 LAB — ACTH: C206 ACTH: 18 pg/mL (ref 6–50)

## 2017-05-28 ENCOUNTER — Encounter: Payer: Self-pay | Admitting: Family Medicine

## 2017-06-03 ENCOUNTER — Telehealth: Payer: Self-pay

## 2017-06-03 ENCOUNTER — Ambulatory Visit (HOSPITAL_COMMUNITY)
Admission: RE | Admit: 2017-06-03 | Discharge: 2017-06-03 | Disposition: A | Discharge status: Home / Self Care | Payer: 59 | Source: Ambulatory Visit | Attending: Otolaryngology | Admitting: Otolaryngology

## 2017-06-03 DIAGNOSIS — J320 Chronic maxillary sinusitis: Secondary | ICD-10-CM | POA: Insufficient documentation

## 2017-06-03 DIAGNOSIS — J329 Chronic sinusitis, unspecified: Secondary | ICD-10-CM | POA: Diagnosis not present

## 2017-06-03 IMAGING — CT CT MAXILLOFACIAL W/O CM
3 series · 15 of 47 positions shown, 18 images · non-contrast
Comparison: None.

CLINICAL DATA: Left-sided sinus and hearing symptoms.

EXAM:
CT MAXILLOFACIAL WITHOUT CONTRAST
TECHNIQUE: Multidetector CT images of the paranasal sinuses were obtained using
the standard protocol without intravenous contrast.

[Series 2: standard · axial · 0.42mm/px · z∈[+732,+888]mm · 9 of 182 slices shown, 12 images]
[im 13/182  brain]
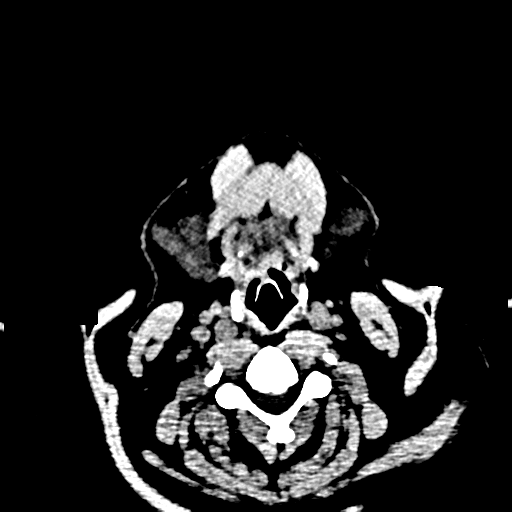
[im 13/182  bone]
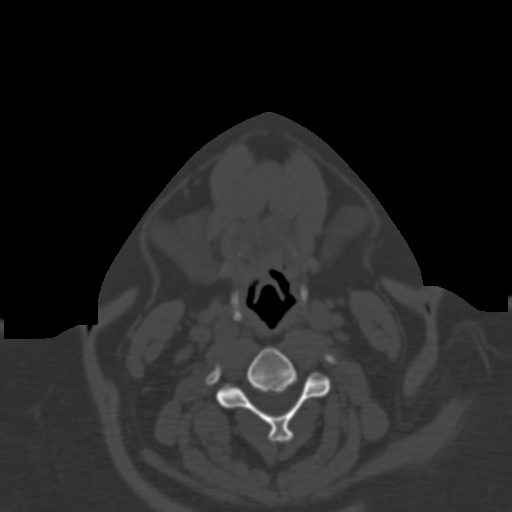
[im 32/182  bone]
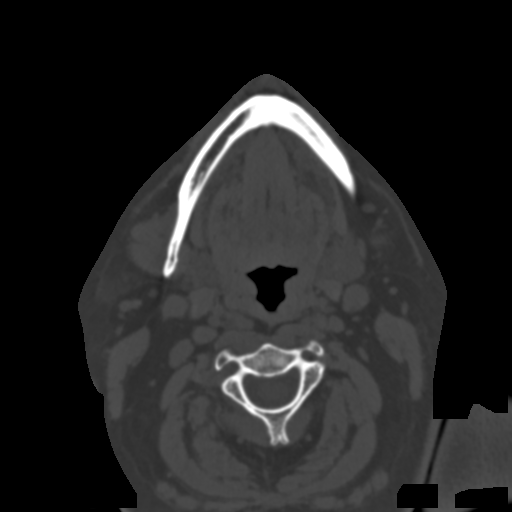
[im 50/182  bone]
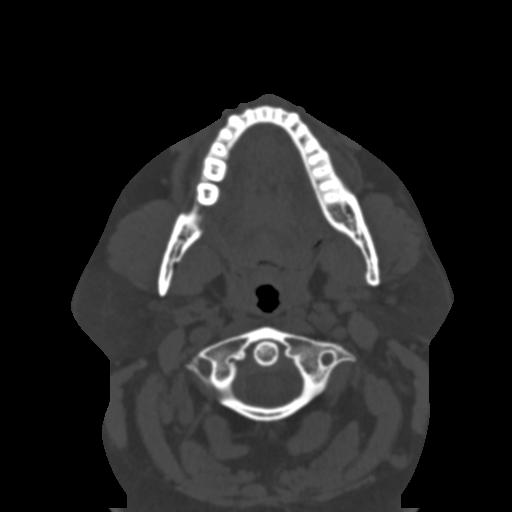
[im 69/182  bone]
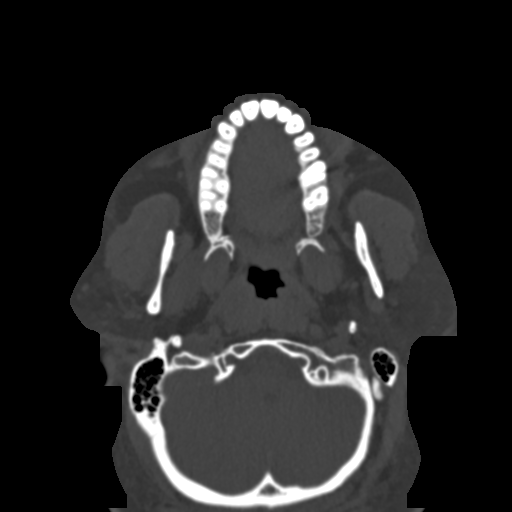
[im 94/182  brain]
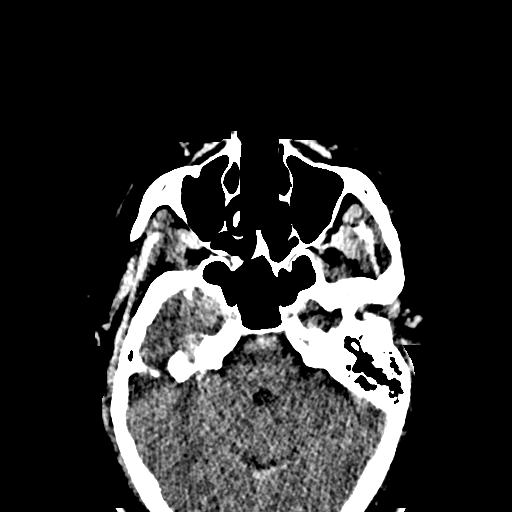
[im 94/182  bone]
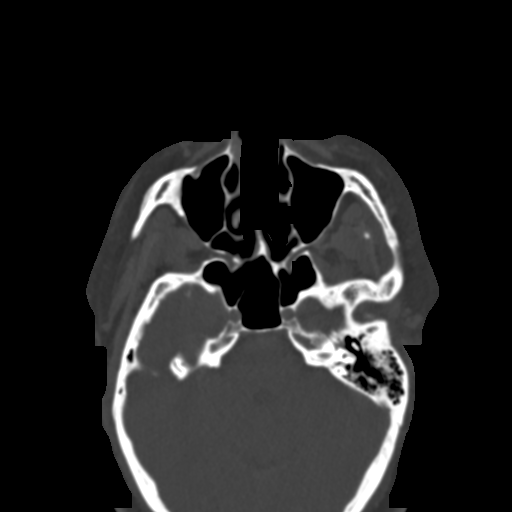
[im 113/182  bone]
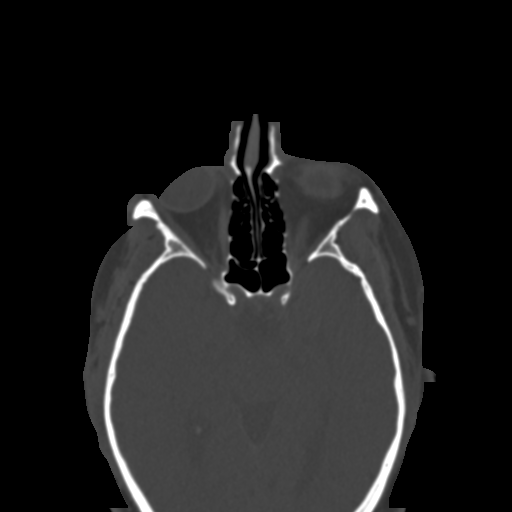
[im 132/182  bone]
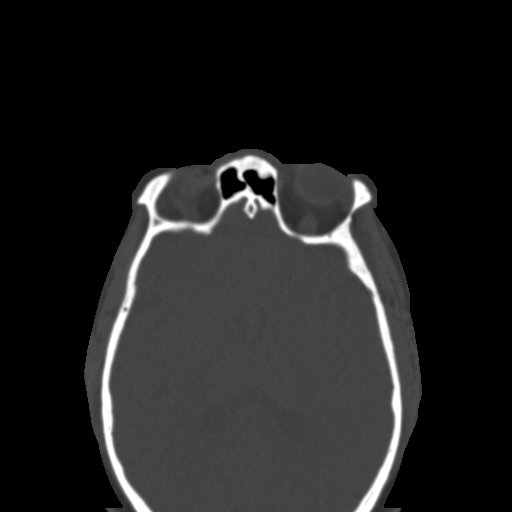
[im 150/182  bone]
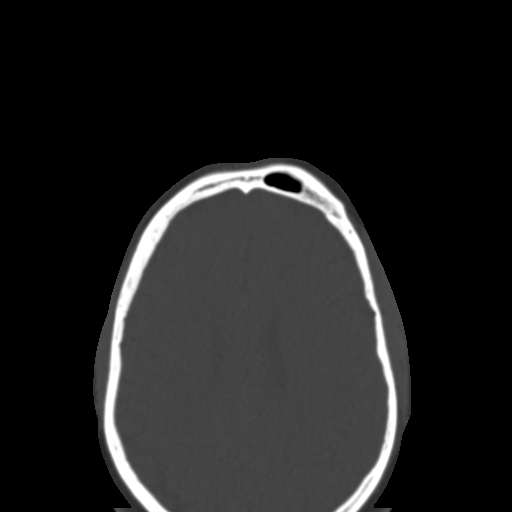
[im 169/182  brain]
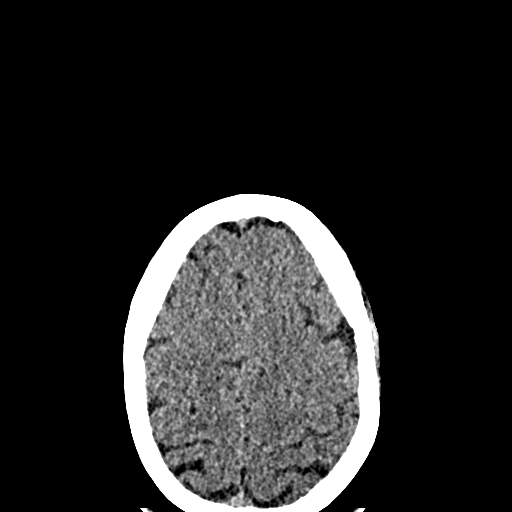
[im 169/182  bone]
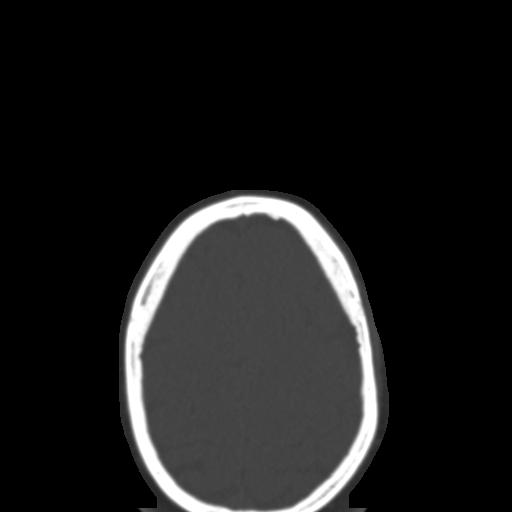

[Series 4: coronal · coronal · 0.40mm/px · 3 of 85 slices shown]
[im 29/85  bone]
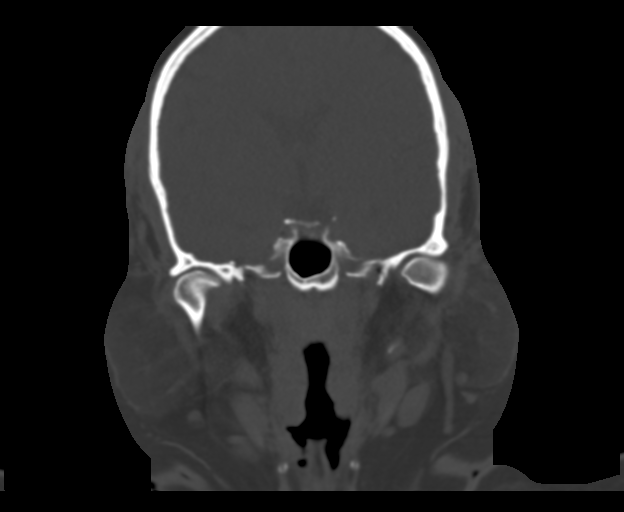
[im 38/85  bone]
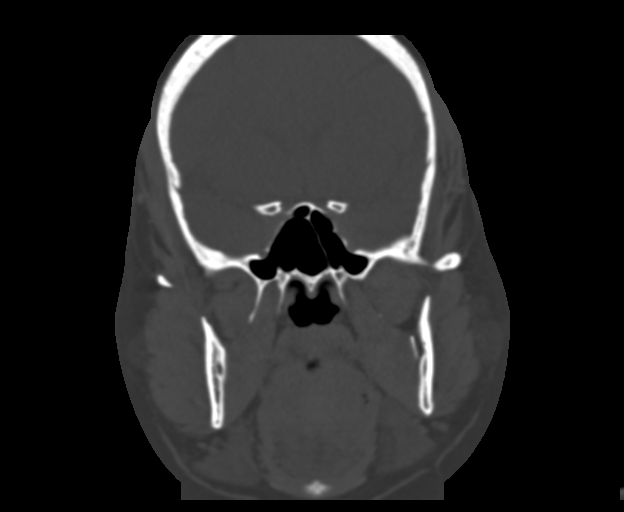
[im 47/85  bone]
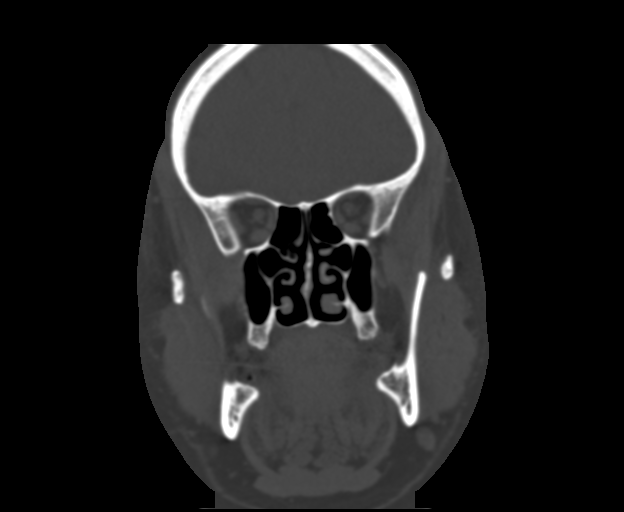

[Series 5: sagittal · sagittal · 0.35mm/px · 3 of 97 slices shown]
[im 33/97  bone]
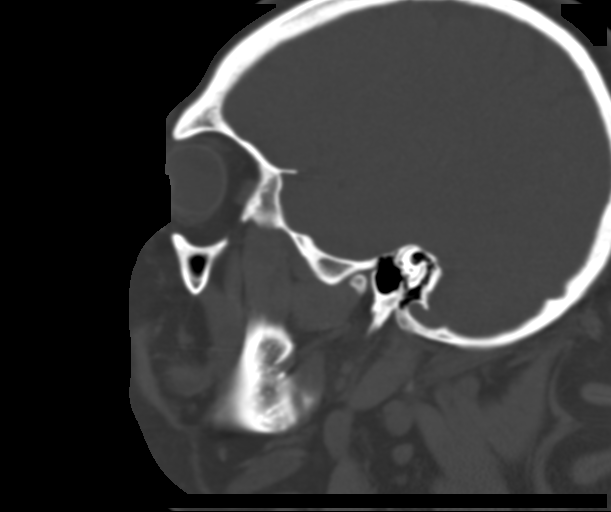
[im 49/97  bone]
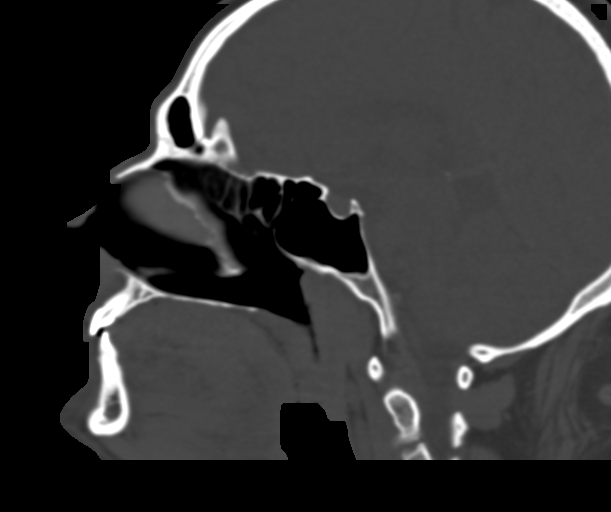
[im 65/97  bone]
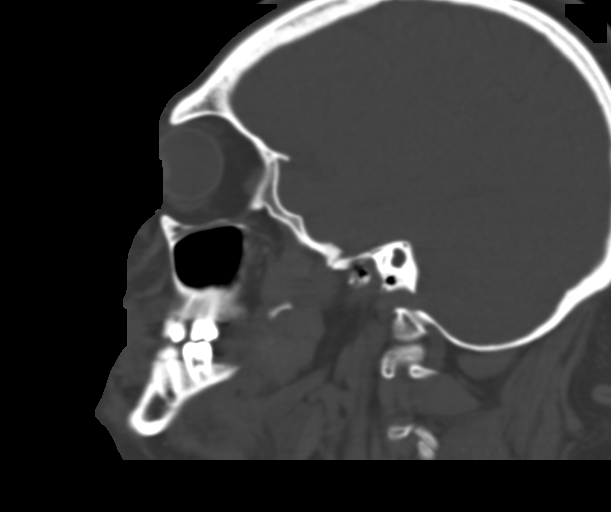

[15 of 47 positions shown; findings below may reference images not displayed]

FINDINGS: Paranasal sinuses:

Frontal: Normally aerated. Patent frontal sinus drainage pathways.

Ethmoid: Normally aerated.

Maxillary: Normally aerated.

Sphenoid: Normally aerated. Patent sphenoethmoidal recesses.

Right ostiomeatal unit: Infundibulum is narrow but sufficiently
patent. No unfavorable variant otherwise.

Left ostiomeatal unit: Infundibulum is narrow but sufficiently
patent. Small adjacent Haller cell.

Nasal passages: Patent. Nasal septum bows 3 mm towards the right
with a right spur.

Anatomy: Minor pneumatization superior to both anterior ethmoid
notches. Symmetric and intact olfactory grooves and fovea
ethmoidalis, Keros II (4-7mm) Sellar sphenoid pneumatization
pattern. No dehiscence of carotid or optic canals. No onodi cell.

Other: No fluid in the middle ears or mastoids. Ossicles appear
normal. Inner ear structures appear normal. No other facial region
soft tissue lesion or bone lesion.
IMPRESSION: No evidence of inflammatory sinus disease. Normal appearing temporal
bones.

## 2017-06-03 MED FILL — ATOMOXETINE HCL 40 MG CAP: 40 | 30 days supply | Qty: 30 | Fill #0

## 2017-06-03 NOTE — Telephone Encounter (Signed)
Patient notified that PA is being processed for the medication and we will let her know once we get a approval or denial.

## 2017-06-08 ENCOUNTER — Encounter: Payer: Self-pay | Admitting: Family Medicine

## 2017-06-09 MED FILL — SAXENDA 18 MG/3 ML PEN: 18 | 30 days supply | Qty: 15 | Fill #0

## 2017-06-10 MED FILL — UNIFINE PENTIPS 8MM 31G: 31G X 8 MM | 90 days supply | Qty: 100 | Fill #0

## 2017-06-15 MED FILL — ANASTROZOLE 1 MG TABLET: 1 | 30 days supply | Qty: 30 | Fill #0

## 2017-06-20 MED FILL — ATENOLOL 25 MG TABLET: 25 | 30 days supply | Qty: 30 | Fill #2

## 2017-06-20 MED FILL — PROGESTERONE 100 MG CAPSULE: 100 | 30 days supply | Qty: 30 | Fill #2

## 2017-06-27 ENCOUNTER — Ambulatory Visit: Payer: Self-pay | Admitting: "Endocrinology

## 2017-06-29 ENCOUNTER — Ambulatory Visit: Payer: 59 | Admitting: Family Medicine

## 2017-07-14 ENCOUNTER — Ambulatory Visit (INDEPENDENT_AMBULATORY_CARE_PROVIDER_SITE_OTHER): Payer: Self-pay | Admitting: Otolaryngology

## 2017-07-15 MED FILL — ATOMOXETINE HCL 40 MG CAP: 40 | 30 days supply | Qty: 30 | Fill #1

## 2017-07-19 ENCOUNTER — Encounter: Payer: Self-pay | Admitting: "Endocrinology

## 2017-07-19 ENCOUNTER — Ambulatory Visit: Payer: No Typology Code available for payment source | Admitting: "Endocrinology

## 2017-07-19 VITALS — BP 134/86 | HR 76 | Ht 64.0 in | Wt 255.0 lb

## 2017-07-19 DIAGNOSIS — E049 Nontoxic goiter, unspecified: Secondary | ICD-10-CM

## 2017-07-19 DIAGNOSIS — D3501 Benign neoplasm of right adrenal gland: Secondary | ICD-10-CM

## 2017-07-19 DIAGNOSIS — E669 Obesity, unspecified: Secondary | ICD-10-CM | POA: Insufficient documentation

## 2017-07-19 DIAGNOSIS — E66812 Obesity, class 2: Secondary | ICD-10-CM | POA: Insufficient documentation

## 2017-07-19 NOTE — Patient Instructions (Signed)

## 2017-07-19 NOTE — Progress Notes (Signed)
Consult Note                                            07/19/2017, 6:01 PM   Subjective:    Patient ID: Paula King, female    DOB: [birth year], PCP Scot Jun, FNP   Past Medical History:  Diagnosis Date  . Diverticulitis   . Hypertension    Past Surgical History:  Procedure Laterality Date  . CESAREAN SECTION    . CHOLECYSTECTOMY    . WISDOM TOOTH EXTRACTION     Social History   Socioeconomic History  . Marital status: Single    Spouse name: None  . Number of children: None  . Years of education: None  . Highest education level: None  Social Needs  . Financial resource strain: None  . Food insecurity - worry: None  . Food insecurity - inability: None  . Transportation needs - medical: None  . Transportation needs - non-medical: None  Occupational History  . None  Tobacco Use  . Smoking status: Never Smoker  . Smokeless tobacco: Never Used  Substance and Sexual Activity  . Alcohol use: No  . Drug use: No  . Sexual activity: Yes    Birth control/protection: None  Other Topics Concern  . None  Social History Narrative  . None   Outpatient Encounter Medications as of 07/19/2017  Medication Sig  . anastrozole (ARIMIDEX) 1 MG tablet Take 1 mg by mouth daily.  . ATENOLOL PO Take 25 mg by mouth.  . cetirizine (ZYRTEC) 10 MG tablet Take 10 mg by mouth daily.  . ranitidine (ZANTAC) 75 MG tablet Take 75 mg by mouth daily.  . [DISCONTINUED] DiphenhydrAMINE HCl (BENADRYL PO) Take by mouth.  . [DISCONTINUED] HYDROcodone-acetaminophen (VICODIN) 5-500 MG per tablet Take 1 tablet by mouth every 6 (six) hours as needed for pain. (Patient not taking: Reported on 02/03/2015)  . [DISCONTINUED] Insulin Pen Needle (BD ULTRA-FINE PEN NEEDLES) 29G X 12.7MM MISC ICD10 E11.9 for use with insulin administration  . [DISCONTINUED] Liraglutide -Weight Management 18 MG/3ML SOPN Inject 0.6 mg into the skin daily for 7 days, THEN 1.2 mg daily for 7 days, THEN 2.4 mg daily  for 7 days, THEN 3 mg daily.  . [DISCONTINUED] progesterone (PROMETRIUM) 100 MG capsule Take 100 mg by mouth daily.   . [DISCONTINUED] RaNITidine HCl (RANITIDINE 150 MAX STRENGTH PO) Take by mouth.   No facility-administered encounter medications on file as of 07/19/2017.    ALLERGIES: Allergies  Allergen Reactions  . Ciprofloxacin     IV Only-Patient started sweating and becoming clammy when was infused with IV cipro on 08/09/14. Can tolerate PO Ciprofloxacin     VACCINATION STATUS:  There is no immunization history on file for this patient.  HPI Paula King is 37 y.o. female who presents today with a medical history as above. she is being seen in consultation for right adrenal adenoma requested by Scot Jun, FNP. - Her history starts in February 2016 when she underwent CT scan of her abdomen due to left lower quadrant pain which incidentally showed 2.7 cm mass on her right adrenal gland which was reported to be consistent with adenoma. She does not recall if any treatment or workup was offered for this at that time. - Repeat CT abdomen in November 2018 revealed that the adenoma persisted and  measured 3 cm, still favoring adenoma. -   she has been dealing with symptoms of  progressive weight gain of 60 pounds over the last 2 years associated with fatigue. - She has history of prediabetes, hypertension which required 1 medication with atenolol 25 mg by mouth daily. - She reports positively for easy bruising. She denies family history of adrenal, pituitary, parathyroid, nor pancreatic dysfunctions. - She has 1 child, 8 years of age. - She reports that she has tried to lose weight by increasing her exercise and dietary modifications. - She denies diaphoresis, uncontrolled hypertension, headaches.  Review of Systems  Constitutional: +weight gain, + fatigue, no subjective hyperthermia, no subjective hypothermia Eyes: no blurry vision, no xerophthalmia ENT: no sore throat, no  nodules palpated in throat, no dysphagia/odynophagia, no hoarseness Cardiovascular: no Chest Pain, no Shortness of Breath, no palpitations, no leg swelling Respiratory: no cough, no SOB Gastrointestinal: no Nausea/Vomiting/Diarhhea Musculoskeletal: no muscle/joint aches Skin: no rashes Neurological: no tremors, no numbness, no tingling, no dizziness Psychiatric: no depression, no anxiety  Objective:    BP 134/86   Pulse 76   Ht 5\' 4"  (1.626 m)   Wt 255 lb (115.7 kg)   BMI 43.77 kg/m   Wt Readings from Last 3 Encounters:  07/19/17 255 lb (115.7 kg)  05/24/17 265 lb (120.2 kg)  04/18/17 264 lb (119.7 kg)    Physical Exam  Constitutional: + Obese, not in acute distress, normal state of mind Eyes: PERRLA, EOMI, no exophthalmos ENT: moist mucous membranes, + palpable thyromegaly, no cervical lymphadenopathy, sternoclavicular fossae full bilaterally Cardiovascular: normal precordial activity, Regular Rate and Rhythm, no Murmur/Rubs/Gallops Respiratory:  adequate breathing efforts, no gross chest deformity, Clear to auscultation bilaterally Gastrointestinal: abdomen soft, Non -tender, No distension, Bowel Sounds present, + purplish striae present . Musculoskeletal: no gross deformities, strength intact in all four extremities Skin: moist, warm, no rashes, + small doso-cervical fat pad present   Neurological: no tremor with outstretched hands, Deep tendon reflexes normal in all four extremities.  CMP ( most recent) CMP     Component Value Date/Time   NA 147 (H) 04/18/2017 1041   K 4.5 04/18/2017 1041   CL 100 04/18/2017 1041   CO2 20 04/18/2017 1041   GLUCOSE 103 (H) 04/18/2017 1041   BUN 11 04/18/2017 1041   CREATININE 1.11 (H) 04/18/2017 1041   CALCIUM 9.4 04/18/2017 1041   PROT 7.3 04/18/2017 1041   ALBUMIN 3.1 (L) 08/10/2014 0148   AST 26 04/18/2017 1041   ALT 30 (H) 04/18/2017 1041   ALKPHOS 77 08/10/2014 0148   BILITOT 0.5 04/18/2017 1041   GFRNONAA 64 04/18/2017  1041   GFRAA 74 04/18/2017 1041     Diabetic Labs (most recent): Lab Results  Component Value Date   HGBA1C 5.4 05/24/2017     Lipid Panel ( most recent) Lipid Panel     Component Value Date/Time   CHOL 141 04/18/2017 1041   TRIG 115 04/18/2017 1041   HDL 46 (L) 04/18/2017 1041   CHOLHDL 3.1 04/18/2017 1041      Lab Results  Component Value Date   TSH 1.83 04/18/2017   05/10/2017   CT abdomen  Radiology description of her right adrenal mass: Adrenal: Previously noted right adrenal mass is similar to the prior examination measuring 3.0 x 2.6 cm and is diffusely low-attenuation (0 HU), compatible with an adenoma. Left adrenal gland and bilateral kidneys are normal in appearance. No hydroureteronephrosis in the visualized portions of the abdomen.  Assessment & Plan:   1. Adrenal adenoma, right   Paula King  is being seen at a kind request of Scot Jun, FNP. - I have reviewed her available endocrine records and clinically evaluated the patient. - Based on my reviews, she has  stable right adrenal mass consistent with adenoma.  -She does not have endocrine workup. - I will proceed to obtain basic endocrine evaluation for functionality. - I will proceed to obtain 24-hour urine for total and  Fractionated, catecholamines, total and fractionated metanephrines, aldosterone, and free cortisol. - Her history is significant for rapid weight gain of 60 pounds over the last 2 years. Although, her history is typical of weight gain due to chronic excessive calorie consumption, she will need to be screened for Cushing's syndrome/disease.  2. Goiter - She does not have family history of thyroid cancer. Physical exam was significant for neck fullness and goiter. I'll proceed to obtain thyroid/neck ultrasound.  3. Morbid obesity ([place]) - This is her major health problem.    - I did not initiate any new prescriptions today. - She will benefit from cardiac restriction to  lose weight. I also discussed exercise regimen which is appropriate for her to augment her dietary adjustments for weight loss.  -  Suggestion is made for her to avoid simple carbohydrates  from her diet including Cakes, Sweet Desserts / Pastries, Ice Cream, Soda (diet and regular), Sweet Tea, Candies, Chips, Cookies, Store Bought Juices, Alcohol in Excess of  1-2 drinks a day, Artificial Sweeteners, and "Sugar-free" Products. This will help patient to have stable blood glucose profile and potentially avoid unintended weight gain.  - She will return in 2 weeks to discuss her studies above. - I did not initiate any new prescriptions today.  - I advised patient to maintain close follow up with Scot Jun, FNP for primary care needs. Follow up plan: Return in about 2 weeks (around 08/02/2017) for 24 hour urine studies, Thyroid / Neck Ultrasound.   Glade Lloyd, MD [facility] [address] [place], [place] [address] Phone: [phone]  Fax: [phone]     07/19/2017, 6:01 PM  This note was partially dictated with voice recognition software. Similar sounding words can be transcribed inadequately or may not  be corrected upon review.

## 2017-07-22 ENCOUNTER — Other Ambulatory Visit: Payer: Self-pay

## 2017-07-22 ENCOUNTER — Ambulatory Visit (INDEPENDENT_AMBULATORY_CARE_PROVIDER_SITE_OTHER): Payer: No Typology Code available for payment source | Admitting: Family Medicine

## 2017-07-22 ENCOUNTER — Encounter: Payer: Self-pay | Admitting: Family Medicine

## 2017-07-22 DIAGNOSIS — K219 Gastro-esophageal reflux disease without esophagitis: Secondary | ICD-10-CM | POA: Diagnosis not present

## 2017-07-22 DIAGNOSIS — J3489 Other specified disorders of nose and nasal sinuses: Secondary | ICD-10-CM | POA: Diagnosis not present

## 2017-07-22 DIAGNOSIS — J349 Unspecified disorder of nose and nasal sinuses: Secondary | ICD-10-CM

## 2017-07-22 MED ORDER — RANITIDINE HCL 75 MG PO TABS: 75.0000 mg | ORAL_TABLET | Freq: Every day | ORAL | 1 refills | Status: DC | PRN

## 2017-07-22 MED ORDER — ATENOLOL 25 MG PO TABS: 25.0000 mg | ORAL_TABLET | Freq: Every day | ORAL | 3 refills | Status: DC

## 2017-07-22 MED ORDER — CETIRIZINE HCL 10 MG PO TABS: 10.0000 mg | ORAL_TABLET | Freq: Every day | ORAL | 1 refills | Status: DC

## 2017-07-22 MED FILL — ATENOLOL 25 MG TABLET: 25 | 90 days supply | Qty: 90 | Fill #0

## 2017-07-22 NOTE — Progress Notes (Signed)
Patient ID: Paula King, female    DOB: 1981/06/28, 37 y.o.   MRN: [id number]  PCP: Paula Jun, FNP  Chief Complaint  Patient presents with  . Medication Refill    and follow up    Subjective:  HPI Paula King is a 37 y.o. female with hypertension, morbid obesity, depression, presents for evaluation follow-up of weight management and medication refill.  Paula King was previously prescribed Liraglutide for weight loss management.  She reports consistently using medication for 30 days however experienced such GI distress she discontinued medication.  She is to resident starting another medication for weight loss.  She is also considering possible bariatric surgery at some point.  She admits to no diligent commitment to physical activity as was previously prescribed.  She has made some dietary changes however admits to inconsistent adherence. Reports increased water intake. Current Body mass index is 45.83 kg/m. consistent with morbid obesity. She has been under the care of a dietician and did not find this processed useful.   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 She is a prior patient of Paula King and would like a referral to return to his practice in order to have a procedure completed to correct this deformity. Social History   Socioeconomic History  . Marital status: Single    Spouse name: Not on file  . Number of children: Not on file  . Years of education: Not on file  . Highest education level: Not on file  Social Needs  . Financial resource strain: Not on file  . Food insecurity - worry: Not on file  . Food insecurity - inability: Not on file  . Transportation needs - medical: Not on file  . Transportation needs - non-medical: Not on file  Occupational History  . Not on file  Tobacco Use  . Smoking status: Never Smoker  . Smokeless tobacco: Never Used  Substance and Sexual Activity  . Alcohol use: No  . Drug use: No   . Sexual activity: Yes    Birth control/protection: None  Other Topics Concern  . Not on file  Social History Narrative  . Not on file    Family History  Problem Relation Age of Onset  . Hypertension Father     Review of Systems  Constitutional: Positive for fatigue.  HENT:       Hx of bowing nasal cavity   Respiratory: Negative.   Cardiovascular: Negative.   Genitourinary: Negative.   Musculoskeletal: Negative.   Psychiatric/Behavioral: Negative.    Patient Active Problem List   Diagnosis Date Noted  . Goiter 07/19/2017  . Morbid obesity ([place]) 07/19/2017  . Diverticulitis of colon 08/09/2014  . Abdominal pain 08/09/2014  . Adrenal adenoma, right 08/09/2014  . Diverticulitis 08/09/2014  . Elevated transaminase level 08/09/2014    Allergies  Allergen Reactions  . Ciprofloxacin     IV Only-Patient started sweating and becoming clammy when was infused with IV cipro on 08/09/14. Can tolerate PO Ciprofloxacin     Prior to Admission medications   Medication Sig Start Date End Date Taking? Authorizing Provider  anastrozole (ARIMIDEX) 1 MG tablet Take 1 mg by mouth daily.   Yes [provider]  ATENOLOL PO Take 25 mg by mouth.   Yes [provider]  cetirizine (ZYRTEC) 10 MG tablet Take 10 mg by mouth daily.   Yes [provider]  ranitidine (ZANTAC) 75 MG tablet Take 75 mg by mouth daily.  Yes [provider]    Past Medical, Surgical Family and Social History reviewed and updated.    Objective:   Today's Vitals   07/22/17 1502  BP: 124/86  Pulse: 77  Temp: 98.2 F (36.8 C)  TempSrc: Oral  SpO2: 100%  Weight: 267 lb (121.1 kg)  Height: 5\' 4"  (1.626 m)  PainSc: 0-No pain    Wt Readings from Last 3 Encounters:  07/22/17 267 lb (121.1 kg)  07/19/17 255 lb (115.7 kg)  05/24/17 265 lb (120.2 kg)    Physical Exam Constitutional: Patient appears well-developed and well-nourished. No distress. HENT: Normocephalic,  atraumatic, External right and left ear normal. Oropharynx is clear and moist.  Eyes: Conjunctivae and EOM are normal. PERRLA, no scleral icterus. Neck: Normal ROM. Neck supple. No JVD. No tracheal deviation. No thyromegaly. CVS: RRR, S1/S2 +, no murmurs, no gallops, no carotid bruit.  Pulmonary: Effort and breath sounds normal, no stridor, rhonchi, wheezes, rales.  Abdominal: Soft. BS +, no distension, tenderness, rebound or guarding.  Musculoskeletal: Normal range of motion. No edema and no tenderness.  Lymphadenopathy: No lymphadenopathy noted, cervical, inguinal or axillary Neuro: Alert. Normal reflexes, muscle tone coordination. No cranial nerve deficit. Skin: Skin is warm and dry. No rash noted. Not diaphoretic. No erythema. No pallor. Psychiatric: Normal mood and affect. Behavior, judgment, thought content normal.   Assessment & King:  1. Morbid obesity ([place]), Current Body mass index is 45.83 kg/m. Patient has been previously prescribed several different medications to facilitate weight loss however has been unsuccessful.  She is currently being followed by endocrinology and been evaluated for possible metabolic causes to persistent obesity.  I am also referring her to medical weight management today for further evaluation and treatment options that would facilitate in weight loss.  2. Disorder of nasal cavity, reviewed epic did not see any prior documentation of nasal cavity deformity.  However patient reports that she is previously been followed by Paula King and only requires a referral due to change in insurance.  I am submitting referral for ENT. - Ambulatory referral to ENT  3. Gastroesophageal reflux disease without esophagitis, chronic, controlled with ranitidine.  Refilled medication. Encouraged dietary modifications in order to facilitate trial of symptoms.  Meds ordered this encounter  Medications  . ranitidine (ZANTAC) 75 MG tablet    Sig: Take 1 tablet (75 mg total) by mouth  daily as needed for heartburn.    Dispense:  90 tablet    Refill:  1    Order Specific Question:   Supervising Provider    Answer:   Paula King  . cetirizine (ZYRTEC) 10 MG tablet    Sig: Take 1 tablet (10 mg total) by mouth daily.    Dispense:  90 tablet    Refill:  1    Order Specific Question:   Supervising Provider    Answer:   Paula King  . atenolol (TENORMIN) 25 MG tablet    Sig: Take 1 tablet (25 mg total) by mouth daily.    Dispense:  90 tablet    Refill:  3    Order Specific Question:   Supervising Provider    Answer:   Paula King     Orders Placed This Encounter  Procedures  . Amb Ref to Medical Weight Management  . Ambulatory referral to Endocrinology  . Ambulatory referral to ENT      RTC: 6 months hypertension       Carroll Sage.  Kenton Kingfisher, MSN, FNP-C [facility]  [address] [place], [place] [address] [phone]

## 2017-07-25 ENCOUNTER — Ambulatory Visit (HOSPITAL_COMMUNITY)
Admission: RE | Admit: 2017-07-25 | Discharge: 2017-07-25 | Disposition: A | Discharge status: Home / Self Care | Payer: No Typology Code available for payment source | Source: Ambulatory Visit | Attending: "Endocrinology | Admitting: "Endocrinology

## 2017-07-25 DIAGNOSIS — E049 Nontoxic goiter, unspecified: Secondary | ICD-10-CM | POA: Diagnosis present

## 2017-07-25 DIAGNOSIS — E010 Iodine-deficiency related diffuse (endemic) goiter: Secondary | ICD-10-CM | POA: Insufficient documentation

## 2017-07-26 ENCOUNTER — Ambulatory Visit (HOSPITAL_COMMUNITY): Payer: No Typology Code available for payment source

## 2017-07-26 ENCOUNTER — Other Ambulatory Visit: Payer: Self-pay | Admitting: "Endocrinology

## 2017-07-26 MED FILL — ANASTROZOLE 1 MG TABLET: 1 | 30 days supply | Qty: 30 | Fill #1

## 2017-07-26 MED FILL — SAXENDA 18 MG/3 ML PEN: 18 | 30 days supply | Qty: 15 | Fill #1

## 2017-07-28 ENCOUNTER — Telehealth: Payer: Self-pay | Admitting: Family Medicine

## 2017-07-28 ENCOUNTER — Encounter: Payer: Self-pay | Admitting: Family Medicine

## 2017-07-28 NOTE — Telephone Encounter (Signed)
Please process referrals -office visit note complete

## 2017-07-28 NOTE — Telephone Encounter (Signed)
See prior message

## 2017-07-28 NOTE — Telephone Encounter (Signed)
Referrals processed.

## 2017-08-02 LAB — CATECHOLAMINES, FRACTIONATED, URINE, 24 HOUR

## 2017-08-02 LAB — METANEPHRINES, URINE, 24 HOUR
METANEPH TOTAL UR: 85 ug/L
Metanephrines, 24H Ur: 119 ug/24 hr (ref 45–290)
Normetanephrine, 24H Ur: 750 ug/24 hr — ABNORMAL HIGH (ref 82–500)
Normetanephrine, Ur: 536 ug/L

## 2017-08-02 LAB — CORTISOL, URINE, FREE
CORTISOL,F,UG/L,U: 42 ug/L
Cortisol (Ur), Free: 59 ug/24 hr — ABNORMAL HIGH (ref 0–50)

## 2017-08-02 LAB — CREATININE, URINE, 24 HOUR
Creatinine, 24H Ur: 2884 mg/24 hr — ABNORMAL HIGH (ref 800–1800)
Creatinine, Urine: 206 mg/dL

## 2017-08-02 LAB — ALDOSTERONE, URINE
Aldosterone U,Random: 4.61 ug/L
Aldosterone, 24H Ur: 6.45 ug/24 hr (ref 0.00–19.00)

## 2017-08-03 ENCOUNTER — Ambulatory Visit (INDEPENDENT_AMBULATORY_CARE_PROVIDER_SITE_OTHER): Payer: No Typology Code available for payment source | Admitting: "Endocrinology

## 2017-08-03 ENCOUNTER — Encounter: Payer: Self-pay | Admitting: "Endocrinology

## 2017-08-03 VITALS — BP 110/79 | HR 78 | Ht 64.0 in | Wt 251.0 lb

## 2017-08-03 DIAGNOSIS — D3501 Benign neoplasm of right adrenal gland: Secondary | ICD-10-CM | POA: Diagnosis not present

## 2017-08-03 MED ORDER — DEXAMETHASONE 1 MG PO TABS: 1.0000 mg | ORAL_TABLET | Freq: Two times a day (BID) | ORAL | 0 refills | Status: DC

## 2017-08-03 MED ORDER — AMLODIPINE BESYLATE 10 MG PO TABS: 10.0000 mg | ORAL_TABLET | Freq: Every day | ORAL | 0 refills | Status: DC

## 2017-08-03 MED FILL — AMLODIPINE BESYLATE 10 MG T: 10 | 30 days supply | Qty: 30 | Fill #0

## 2017-08-03 NOTE — Progress Notes (Signed)
Consult Note                                            08/03/2017, 9:40 PM   Subjective:    Patient ID: Paula King, female    DOB: 1980-12-03, PCP Scot Jun, FNP   Past Medical History:  Diagnosis Date  . Diverticulitis   . Hypertension    Past Surgical History:  Procedure Laterality Date  . CESAREAN SECTION    . CHOLECYSTECTOMY    . WISDOM TOOTH EXTRACTION     Social History   Socioeconomic History  . Marital status: Single    Spouse name: None  . Number of children: None  . Years of education: None  . Highest education level: None  Social Needs  . Financial resource strain: None  . Food insecurity - worry: None  . Food insecurity - inability: None  . Transportation needs - medical: None  . Transportation needs - non-medical: None  Occupational History  . None  Tobacco Use  . Smoking status: Never Smoker  . Smokeless tobacco: Never Used  Substance and Sexual Activity  . Alcohol use: No  . Drug use: No  . Sexual activity: Yes    Birth control/protection: None  Other Topics Concern  . None  Social History Narrative  . None   Outpatient Encounter Medications as of 08/03/2017  Medication Sig  . amLODipine (NORVASC) 10 MG tablet Take 1 tablet (10 mg total) by mouth daily.  Marland Kitchen anastrozole (ARIMIDEX) 1 MG tablet Take 1 mg by mouth daily.  Marland Kitchen atenolol (TENORMIN) 25 MG tablet Take 1 tablet (25 mg total) by mouth daily.  . ATENOLOL PO Take 25 mg by mouth.  . cetirizine (ZYRTEC) 10 MG tablet Take 1 tablet (10 mg total) by mouth daily.  Marland Kitchen dexamethasone (DECADRON) 1 MG tablet Take 1 tablet (1 mg total) by mouth 2 (two) times daily with a meal.  . ranitidine (ZANTAC) 75 MG tablet Take 1 tablet (75 mg total) by mouth daily as needed for heartburn.  Marland Kitchen SAXENDA 18 MG/3ML SOPN INJECT 0 6 MG INTO THE SKIN DAILY FOR 7 DAYS, THEN 1 2 MG DAILY FOR 7 DAYS, THEN 2 4 MG DAILY FOR 7 DAYS, THEN 3 MG DAILY   No facility-administered encounter medications on file as  of 08/03/2017.    ALLERGIES: Allergies  Allergen Reactions  . Ciprofloxacin     IV Only-Patient started sweating and becoming clammy when was infused with IV cipro on 08/09/14. Can tolerate PO Ciprofloxacin     VACCINATION STATUS:  There is no immunization history on file for this patient.  HPI Paula King is 37 y.o. female who presents today with a medical history as above. she is being seen in consultation for right adrenal adenoma requested by Scot Jun, FNP. - Her history starts in February 2016 when she underwent CT scan of her abdomen due to left lower quadrant pain which incidentally showed 2.7 cm mass  on her right adrenal gland which was reported to be consistent with adenoma. She does not recall if any treatment or workup was offered for this at that time. - Repeat CT abdomen in November 2018 revealed that the adenoma persisted and measured 3 cm ( HU 0) still favoring adenoma. -   she has been dealing with symptoms of  progressive weight  gain of 60 pounds over the last 2 years associated with fatigue. - She has history of prediabetes, hypertension which required 1 medication with atenolol 25 mg by mouth daily. - She reports positively for easy bruising. She denies family history of adrenal, pituitary, parathyroid, nor pancreatic dysfunctions. - She has 1 child, 66 years of age. - She reports that she has tried to lose weight by increasing her exercise and dietary modifications. - She denies diaphoresis, uncontrolled hypertension, headaches. -She was sent for 24-hour urine studies which revealed free cortisol of 59 elevated (normal 0-50) and elevated 24-hour urine normetanephrine at 750 (normal 82-500 mcg per 24 hours).  Review of Systems  Constitutional: +weight gain, + fatigue, no subjective hyperthermia, no subjective hypothermia Eyes: no blurry vision, no xerophthalmia ENT: no sore throat, no nodules palpated in throat, no dysphagia/odynophagia, no  hoarseness Cardiovascular: no Chest Pain, no Shortness of Breath, no palpitations, no leg swelling Respiratory: no cough, no SOB Gastrointestinal: no Nausea/Vomiting/Diarhhea Musculoskeletal: no muscle/joint aches Skin: no rashes Neurological: no tremors, no numbness, no tingling, no dizziness Psychiatric: no depression, no anxiety  Objective:    BP 110/79   Pulse 78   Ht 5\' 4"  (1.626 m)   Wt 251 lb (113.9 kg)   BMI 43.08 kg/m   Wt Readings from Last 3 Encounters:  08/03/17 251 lb (113.9 kg)  07/22/17 267 lb (121.1 kg)  07/19/17 255 lb (115.7 kg)    Physical Exam  Constitutional: + Obese, not in acute distress, normal state of mind Eyes: PERRLA, EOMI, no exophthalmos ENT: moist mucous membranes, + palpable thyromegaly, no cervical lymphadenopathy, sternoclavicular fossae full bilaterally Cardiovascular: normal precordial activity, Regular Rate and Rhythm, no Murmur/Rubs/Gallops Respiratory:  adequate breathing efforts, no gross chest deformity, Clear to auscultation bilaterally Gastrointestinal: abdomen soft, Non -tender, No distension, Bowel Sounds present, + purplish striae present . Musculoskeletal: no gross deformities, strength intact in all four extremities Skin: moist, warm, no rashes, + small doso-cervical fat pad present   Neurological: no tremor with outstretched hands, Deep tendon reflexes normal in all four extremities.  CMP ( most recent) CMP     Component Value Date/Time   NA 147 (H) 04/18/2017 1041   K 4.5 04/18/2017 1041   CL 100 04/18/2017 1041   CO2 20 04/18/2017 1041   GLUCOSE 103 (H) 04/18/2017 1041   BUN 11 04/18/2017 1041   CREATININE 1.11 (H) 04/18/2017 1041   CALCIUM 9.4 04/18/2017 1041   PROT 7.3 04/18/2017 1041   ALBUMIN 3.1 (L) 08/10/2014 0148   AST 26 04/18/2017 1041   ALT 30 (H) 04/18/2017 1041   ALKPHOS 77 08/10/2014 0148   BILITOT 0.5 04/18/2017 1041   GFRNONAA 64 04/18/2017 1041   GFRAA 74 04/18/2017 1041     Diabetic Labs (most  recent): Lab Results  Component Value Date   HGBA1C 5.4 05/24/2017     Lipid Panel ( most recent) Lipid Panel     Component Value Date/Time   CHOL 141 04/18/2017 1041   TRIG 115 04/18/2017 1041   HDL 46 (L) 04/18/2017 1041   CHOLHDL 3.1 04/18/2017 1041      Lab Results  Component Value Date   TSH 1.83 04/18/2017   05/10/2017   CT abdomen  Radiology description of her right adrenal mass: Adrenal: Previously noted right adrenal mass is similar to the prior examination measuring 3.0 x 2.6 cm and is diffusely low-attenuation (0 HU), compatible with an adenoma. Left adrenal gland and bilateral kidneys are normal in appearance.  No hydroureteronephrosis in the visualized portions of the abdomen. Recent Results (from the past 2160 hour(s))  ACTH     Status: None   Collection Time: 05/24/17  4:10 PM  Result Value Ref Range   C206 ACTH 18 6 - 50 pg/mL    Comment: Reference range applies only to specimens collected between 7am-10am   POCT glycosylated hemoglobin (Hb A1C)     Status: Normal   Collection Time: 05/24/17  4:18 PM  Result Value Ref Range   Hemoglobin A1C 5.4   Catecholamines, fractionated, urine, 24 hour     Status: None   Collection Time: 07/26/17 10:30 AM  Result Value Ref Range   Epinephrine, Rand Ur CANCELED ug/L    Comment: Specimen submitted for testing had pH greater than allowable range. Please resubmit.  Check directory of services for specimen handling and pH requirements. This test was developed and its performance characteristics determined by [facility]. It has not been cleared or approved by the Food and Drug Administration.  Result canceled by the ancillary.    Epinephrine, 24H Ur CANCELED ug/24 hr    Comment: Unable to calculate result since non-numeric result obtained for component test.  Result canceled by the ancillary.    Norepinephrine, Rand Ur CANCELED ug/L    Comment: Specimen submitted for testing had pH greater than allowable  range. Please resubmit.  Check directory of services for specimen handling and pH requirements. This test was developed and its performance characteristics determined by [facility]. It has not been cleared or approved by the Food and Drug Administration.  Result canceled by the ancillary.    Norepinephrine, 24H Ur CANCELED ug/24 hr    Comment: Specimen submitted for testing had pH greater than allowable range. Please resubmit.  Check directory of services for specimen handling and pH requirements.  Result canceled by the ancillary.    Dopamine, Rand Ur CANCELED ug/L    Comment: Specimen submitted for testing had pH greater than allowable range. Please resubmit.  Check directory of services for specimen handling and pH requirements. This test was developed and its performance characteristics determined by [facility]. It has not been cleared or approved by the Food and Drug Administration.  Result canceled by the ancillary.    Dopamine , 24H Ur CANCELED ug/24 hr    Comment: Specimen submitted for testing had pH greater than allowable range. Please resubmit.  Check directory of services for specimen handling and pH requirements.  Result canceled by the ancillary.   Metanephrines, urine, 24 hour     Status: Abnormal   Collection Time: 07/26/17 10:30 AM  Result Value Ref Range   Normetanephrine, Ur 536 Undefined ug/L    Comment: This test was developed and its performance characteristics determined by [facility]. It has not been cleared or approved by the Food and Drug Administration.    Normetanephrine, 24H Ur 750 (H) 82 - 500 ug/24 hr    Comment:      (Hypertensive) >17 years 11 months:    110 - 1050   Metaneph Total, Ur 85 Undefined ug/L    Comment: This test was developed and its performance characteristics determined by [facility]. It has not been cleared or approved by the Food and Drug Administration.    Metanephrines, 24H Ur 119 45 - 290 ug/24 hr    Comment:       (Hypertensive) >17 years 11 months:     35 -  460  Creatinine, urine, 24 hour     Status: Abnormal  Collection Time: 07/26/17 10:30 AM  Result Value Ref Range   Creatinine, Urine 206.0 Not Estab. mg/dL   Creatinine, 24H Ur 2,884 (H) 800 - 1,800 mg/24 hr  Aldosterone, Urine     Status: None   Collection Time: 07/26/17 10:30 AM  Result Value Ref Range   Aldosterone U,Random 4.61 Not Estab. ug/L   Aldosterone, 24H Ur 6.45 0.00 - 19.00 ug/24 hr    Comment:                                  Adult Ranges                     Low Sodium Intake     20.00 - 80.00                     Normal Sodium Intake   0.00 - 19.00                     High Sodium Intake     0.00 - 12.00 This test was developed and its performance characteristics determined by [facility]. It has not been cleared or approved by the Food and Drug Administration.   Cortisol, urine, free     Status: Abnormal   Collection Time: 07/26/17 10:30 AM  Result Value Ref Range   Cortisol,F,ug/L,U 42 Undefined ug/L   Cortisol (Ur), Free 59 (H) 0 - 50 ug/24 hr    Comment: **Effective September 05, 2017 the reference interval**   for [phone] Cortisol,F,ug/24hr,U will be changing to:                           Age             Female                     0 day  -  1 year      Not estab.                    2 years -  5 years       2 - 16                    6 years - 11 years       4 - 28                              12 years       4 - 12                   13 years - 17 years       6 - 44                   18 years - 27 years       5 - 58                            > 54 years       3 - 5                          Age  Female                    0 day  -  1 year      Not estab.                   2 years -  5 years        2 - 16                   6 years - 11 years        4 - 21                            - 12 years       4 - 4                   13 years - 2 years       6 - 55                            > 80 years       3 - 36 This test  was developed and its performance characteristics determined by [facility]. It has not been cleared or appr oved by the Food and Drug Administration.       Assessment & Plan:   1. Adrenal adenoma, right -Radiologic features favoring benign adenoma. -24-hour urine free cortisol is elevated at 59 wants further workup to rule out Cushing syndrome.  I discussed and initiated 1 mg dexamethasone suppression test. -24-hour urine epinephrine was slightly elevated at 750 not sufficient to diagnose pheochromocytoma.  I will arrange for her to get -Free plasma metanephrines.  I have switched her beta-blocker with amlodipine to avoid beta-blocker interference with plasma metanephrines study. -She will return in 2 weeks to discuss the results.  - Her history is significant for rapid weight gain of 60 pounds over the last 2 years.  -If her above studies confirm a functioning adrenal adenoma, she will be considered for surgery.  2. Goiter - She does not have family history of thyroid cancer.  She has significant thyromegaly with no discrete nodules, no intervention at this time.    3. Morbid obesity ([place]) - This is her major health problem.   - She will benefit from caloric restriction to lose weight. I also discussed exercise regimen which is appropriate for her to augment her dietary adjustments for weight loss.  -  Suggestion is made for her to avoid simple carbohydrates  from her diet including Cakes, Sweet Desserts / Pastries, Ice Cream, Soda (diet and regular), Sweet Tea, Candies, Chips, Cookies, Store Bought Juices, Alcohol in Excess of  1-2 drinks a day, Artificial Sweeteners, and "Sugar-free" Products. This will help patient to have stable blood glucose profile and potentially avoid unintended weight gain.  - I advised patient to maintain close follow up with Scot Jun, FNP for primary care needs.  Follow up plan: Return in about 2 weeks (around 08/17/2017) for follow up with pre-visit  labs.   Glade Lloyd, MD [facility] [address] [place], [place] [address] Phone: [phone]  Fax: [phone]     08/03/2017, 9:40 PM  This note was partially dictated with voice recognition software. Similar sounding words can be transcribed inadequately or may not  be corrected upon review.

## 2017-08-04 MED FILL — DEXAMETHASONE 1 MG TABLET: 1 | 1 days supply | Qty: 1 | Fill #0

## 2017-08-08 ENCOUNTER — Other Ambulatory Visit: Payer: Self-pay

## 2017-08-08 MED ORDER — DEXAMETHASONE 1 MG PO TABS: 1.0000 mg | ORAL_TABLET | Freq: Every day | ORAL | 0 refills | Status: DC

## 2017-08-08 MED FILL — DEXAMETHASONE 1 MG TABLET: 1 | 1 days supply | Qty: 1 | Fill #0

## 2017-08-12 LAB — CORTISOL-AM, BLOOD: CORTISOL - AM: 5 ug/dL

## 2017-08-12 LAB — METANEPHRINES, PLASMA
Metanephrine, Free: <25 pg/mL (ref <=57)
NORMETANEPHRINE FREE: 141 pg/mL (ref <=148)
TOTAL METANEPHRINES-PLASMA: 141 pg/mL (ref <=205)

## 2017-08-16 ENCOUNTER — Ambulatory Visit (INDEPENDENT_AMBULATORY_CARE_PROVIDER_SITE_OTHER): Payer: No Typology Code available for payment source | Admitting: "Endocrinology

## 2017-08-16 ENCOUNTER — Encounter: Payer: Self-pay | Admitting: "Endocrinology

## 2017-08-16 VITALS — BP 119/87 | HR 83 | Ht 64.0 in | Wt 252.0 lb

## 2017-08-16 DIAGNOSIS — D3501 Benign neoplasm of right adrenal gland: Secondary | ICD-10-CM | POA: Diagnosis not present

## 2017-08-16 DIAGNOSIS — E249 Cushing's syndrome, unspecified: Secondary | ICD-10-CM | POA: Diagnosis not present

## 2017-08-16 NOTE — Progress Notes (Signed)
Consult Note                                            08/16/2017, 6:17 PM   Subjective:    Patient ID: Paula King, female    DOB: [birth year], PCP Scot Jun, FNP   Past Medical History:  Diagnosis Date  . Diverticulitis   . Hypertension    Past Surgical History:  Procedure Laterality Date  . CESAREAN SECTION    . CHOLECYSTECTOMY    . WISDOM TOOTH EXTRACTION     Social History   Socioeconomic History  . Marital status: Single    Spouse name: None  . Number of children: None  . Years of education: None  . Highest education level: None  Social Needs  . Financial resource strain: None  . Food insecurity - worry: None  . Food insecurity - inability: None  . Transportation needs - medical: None  . Transportation needs - non-medical: None  Occupational History  . None  Tobacco Use  . Smoking status: Never Smoker  . Smokeless tobacco: Never Used  Substance and Sexual Activity  . Alcohol use: No  . Drug use: No  . Sexual activity: Yes    Birth control/protection: None  Other Topics Concern  . None  Social History Narrative  . None   Outpatient Encounter Medications as of 08/16/2017  Medication Sig  . atomoxetine (STRATTERA) 40 MG capsule Take 40 mg by mouth daily.  Marland Kitchen anastrozole (ARIMIDEX) 1 MG tablet Take 1 mg by mouth daily.  Marland Kitchen atenolol (TENORMIN) 25 MG tablet Take 1 tablet (25 mg total) by mouth daily.  . ATENOLOL PO Take 25 mg by mouth.  . cetirizine (ZYRTEC) 10 MG tablet Take 1 tablet (10 mg total) by mouth daily.  . ranitidine (ZANTAC) 75 MG tablet Take 1 tablet (75 mg total) by mouth daily as needed for heartburn.  Marland Kitchen SAXENDA 18 MG/3ML SOPN INJECT 0 6 MG INTO THE SKIN DAILY FOR 7 DAYS, THEN 1 2 MG DAILY FOR 7 DAYS, THEN 2 4 MG DAILY FOR 7 DAYS, THEN 3 MG DAILY  . [DISCONTINUED] amLODipine (NORVASC) 10 MG tablet Take 1 tablet (10 mg total) by mouth daily.  . [DISCONTINUED] dexamethasone (DECADRON) 1 MG tablet Take 1 tablet (1 mg total) by  mouth at bedtime.   No facility-administered encounter medications on file as of 08/16/2017.    ALLERGIES: Allergies  Allergen Reactions  . Ciprofloxacin     IV Only-Patient started sweating and becoming clammy when was infused with IV cipro on 08/09/14. Can tolerate PO Ciprofloxacin     VACCINATION STATUS:  There is no immunization history on file for this patient.  HPI Paula King is 37 y.o. female who presents today with a medical history as above. she is being seen in follow up for right adrenal adenoma . - Her history starts in February 2016 when she underwent CT scan of her abdomen due to left lower quadrant pain which incidentally showed 2.7 cm mass  on her right adrenal gland which was reported to be consistent with adenoma. She does not recall if any treatment or workup was offered for this at that time. - Repeat CT abdomen in November 2018 revealed that the adenoma persisted and measured 3 cm ( HU 0) still favoring adenoma. -   she has been dealing  with symptoms of  progressive weight gain of 60 pounds over the last 2 years associated with fatigue. - She has history of prediabetes, hypertension which required 1 medication with atenolol 25 mg by mouth daily. - She reports positively for easy bruising. She denies family history of adrenal, pituitary, parathyroid, nor pancreatic dysfunctions. - She has 1 child, 54 years of age. - She reports that she has tried to lose weight by increasing her exercise and dietary modifications. - She denies diaphoresis, uncontrolled hypertension, headaches. -She was sent for 24-hour urine studies which revealed free cortisol of 59 elevated (normal 0-50) and elevated 24-hour urine normetanephrine at 750 (normal 82-500 mcg per 24 hours). -Since her last visit, she underwent low-dose dexamethasone suppression test which was abnormal; plasma free metanephrines were normal.  Review of Systems  Constitutional: +weight gain, + fatigue, no subjective  hyperthermia, no subjective hypothermia Eyes: no blurry vision, no xerophthalmia ENT: no sore throat, no nodules palpated in throat, no dysphagia/odynophagia, no hoarseness Cardiovascular: no Chest Pain, no Shortness of Breath, no palpitations, no leg swelling Respiratory: no cough, no SOB Gastrointestinal: no Nausea/Vomiting/Diarhhea Musculoskeletal: no muscle/joint aches Skin: no rashes Neurological: no tremors, no numbness, no tingling, no dizziness Psychiatric: no depression, no anxiety  Objective:    BP 119/87   Pulse 83   Ht 5\' 4"  (1.626 m)   Wt 252 lb (114.3 kg)   BMI 43.26 kg/m   Wt Readings from Last 3 Encounters:  08/16/17 252 lb (114.3 kg)  08/03/17 251 lb (113.9 kg)  07/22/17 267 lb (121.1 kg)    Physical Exam  Constitutional: + Obese, not in acute distress, normal state of mind Eyes: PERRLA, EOMI, no exophthalmos ENT: moist mucous membranes, + palpable thyromegaly, no cervical lymphadenopathy, sternoclavicular fossae full bilaterally Cardiovascular: normal precordial activity, Regular Rate and Rhythm, no Murmur/Rubs/Gallops Respiratory:  adequate breathing efforts, no gross chest deformity, Clear to auscultation bilaterally Gastrointestinal: abdomen soft, Non -tender, No distension, Bowel Sounds present, + purplish striae present . Musculoskeletal: no gross deformities, strength intact in all four extremities Skin: moist, warm, no rashes, + small doso-cervical fat pad present   Neurological: no tremor with outstretched hands, Deep tendon reflexes normal in all four extremities.  CMP ( most recent) CMP     Component Value Date/Time   NA 147 (H) 04/18/2017 1041   K 4.5 04/18/2017 1041   CL 100 04/18/2017 1041   CO2 20 04/18/2017 1041   GLUCOSE 103 (H) 04/18/2017 1041   BUN 11 04/18/2017 1041   CREATININE 1.11 (H) 04/18/2017 1041   CALCIUM 9.4 04/18/2017 1041   PROT 7.3 04/18/2017 1041   ALBUMIN 3.1 (L) 08/10/2014 0148   AST 26 04/18/2017 1041   ALT 30 (H)  04/18/2017 1041   ALKPHOS 77 08/10/2014 0148   BILITOT 0.5 04/18/2017 1041   GFRNONAA 64 04/18/2017 1041   GFRAA 74 04/18/2017 1041     Diabetic Labs (most recent): Lab Results  Component Value Date   HGBA1C 5.4 05/24/2017     Lipid Panel ( most recent) Lipid Panel     Component Value Date/Time   CHOL 141 04/18/2017 1041   TRIG 115 04/18/2017 1041   HDL 46 (L) 04/18/2017 1041   CHOLHDL 3.1 04/18/2017 1041      Lab Results  Component Value Date   TSH 1.83 04/18/2017   05/10/2017   CT abdomen  Radiology description of her right adrenal mass: Adrenal: Previously noted right adrenal mass is similar to the prior examination measuring  3.0 x 2.6 cm and is diffusely low-attenuation (0 HU), compatible with an adenoma. Left adrenal gland and bilateral kidneys are normal in appearance. No hydroureteronephrosis in the visualized portions of the abdomen.   Recent Results (from the past 2160 hour(s))  ACTH     Status: None   Collection Time: 05/24/17  4:10 PM  Result Value Ref Range   C206 ACTH 18 6 - 50 pg/mL    Comment: Reference range applies only to specimens collected between 7am-10am   POCT glycosylated hemoglobin (Hb A1C)     Status: Normal   Collection Time: 05/24/17  4:18 PM  Result Value Ref Range   Hemoglobin A1C 5.4   Catecholamines, fractionated, urine, 24 hour     Status: None   Collection Time: 07/26/17 10:30 AM  Result Value Ref Range   Epinephrine, Rand Ur CANCELED ug/L    Comment: Specimen submitted for testing had pH greater than allowable range. Please resubmit.  Check directory of services for specimen handling and pH requirements. This test was developed and its performance characteristics determined by [facility]. It has not been cleared or approved by the Food and Drug Administration.  Result canceled by the ancillary.    Epinephrine, 24H Ur CANCELED ug/24 hr    Comment: Unable to calculate result since non-numeric result obtained for component  test.  Result canceled by the ancillary.    Norepinephrine, Rand Ur CANCELED ug/L    Comment: Specimen submitted for testing had pH greater than allowable range. Please resubmit.  Check directory of services for specimen handling and pH requirements. This test was developed and its performance characteristics determined by [facility]. It has not been cleared or approved by the Food and Drug Administration.  Result canceled by the ancillary.    Norepinephrine, 24H Ur CANCELED ug/24 hr    Comment: Specimen submitted for testing had pH greater than allowable range. Please resubmit.  Check directory of services for specimen handling and pH requirements.  Result canceled by the ancillary.    Dopamine, Rand Ur CANCELED ug/L    Comment: Specimen submitted for testing had pH greater than allowable range. Please resubmit.  Check directory of services for specimen handling and pH requirements. This test was developed and its performance characteristics determined by [facility]. It has not been cleared or approved by the Food and Drug Administration.  Result canceled by the ancillary.    Dopamine , 24H Ur CANCELED ug/24 hr    Comment: Specimen submitted for testing had pH greater than allowable range. Please resubmit.  Check directory of services for specimen handling and pH requirements.  Result canceled by the ancillary.   Metanephrines, urine, 24 hour     Status: Abnormal   Collection Time: 07/26/17 10:30 AM  Result Value Ref Range   Normetanephrine, Ur 536 Undefined ug/L    Comment: This test was developed and its performance characteristics determined by [facility]. It has not been cleared or approved by the Food and Drug Administration.    Normetanephrine, 24H Ur 750 (H) 82 - 500 ug/24 hr    Comment:      (Hypertensive) >17 years 11 months:    110 - 1050   Metaneph Total, Ur 85 Undefined ug/L    Comment: This test was developed and its performance characteristics determined by  [facility]. It has not been cleared or approved by the Food and Drug Administration.    Metanephrines, 24H Ur 119 45 - 290 ug/24 hr    Comment:      (  Hypertensive) >17 years 11 months:     35 -  460  Creatinine, urine, 24 hour     Status: Abnormal   Collection Time: 07/26/17 10:30 AM  Result Value Ref Range   Creatinine, Urine 206.0 Not Estab. mg/dL   Creatinine, 24H Ur 2,884 (H) 800 - 1,800 mg/24 hr  Aldosterone, Urine     Status: None   Collection Time: 07/26/17 10:30 AM  Result Value Ref Range   Aldosterone U,Random 4.61 Not Estab. ug/L   Aldosterone, 24H Ur 6.45 0.00 - 19.00 ug/24 hr    Comment:                                  Adult Ranges                     Low Sodium Intake     20.00 - 80.00                     Normal Sodium Intake   0.00 - 19.00                     High Sodium Intake     0.00 - 12.00 This test was developed and its performance characteristics determined by [facility]. It has not been cleared or approved by the Food and Drug Administration.   Cortisol, urine, free     Status: Abnormal   Collection Time: 07/26/17 10:30 AM  Result Value Ref Range   Cortisol,F,ug/L,U 42 Undefined ug/L   Cortisol (Ur), Free 59 (H) 0 - 50 ug/24 hr    Comment: **Effective September 05, 2017 the reference interval**   for [phone] Cortisol,F,ug/24hr,U will be changing to:                           Age             Female                     0 day  -  1 year      Not estab.                    2 years -  5 years       2 - 16                    6 years - 11 years       4 - 28                              12 years       4 - 36                   13 years - 17 years       6 - 84                   18 years - 64 years       5 - 59                            > 80 years       3 - 26  Age             Female                    0 day  -  1 year      Not estab.                   2 years -  5 years        2 - 16                   6 years - 11 years        4 - 29                             - 12 years       4 - 74                   13 years - 41 years       6 - 38                            > 80 years       3 - 42 This test was developed and its performance characteristics determined by [facility]. It has not been cleared or appr oved by the Food and Drug Administration.   Metanephrines, plasma     Status: None   Collection Time: 08/06/17  9:41 AM  Result Value Ref Range   Metanephrine, Free <25 <=57 pg/mL    Comment: . This test was developed and its analytical performance characteristics have been determined by [place], [place]. It has not been cleared or approved by the U.S. Food and Drug Administration. This assay has been validated pursuant to the CLIA regulations and is used for clinical purposes. .    Normetanephrine, Free 141 <=148 pg/mL    Comment: . This test was developed and its analytical performance characteristics have been determined by [place], [place]. It has not been cleared or approved by the U.S. Food and Drug Administration. This assay has been validated pursuant to the CLIA regulations and is used for clinical purposes. .    Total Metanephrines-Plasma 141 <=205 pg/mL    Comment: . For additional information, please refer to http://education.questdiagnostics.com/faq/MetFractFree (This link is being provided for informational/educatio informational/educational purposes only.) . Elevations >4-fold upper reference range: strongly suggestive of a pheochromocytoma(1). . Elevations >1- 4-fold upper reference range: significant but not diagnostic, may be due to medications or stress. Suggest running 24 hr urine fractionated metanephrines and/or serum Chromagranin A for confirmation. . Reference: . (1)Algeciras-Schimnich A et al, Plasma Chromogranin A or Urine Fractionated Metanephrines Follow-Up Testing Improves the Diagnostic Accuracy of Plasma  Fractionated Metanephrines for Pheochromocytoma. The Journal of Clinical Endocrinology # Metabolism 93(1), 91-95, 2008. . . . This test was developed and its analytical performance characteristics have been determined by [place], [place]. It has not been cleared or a pproved by the U.S. Food and Drug Administration. This assay has been validated pursuant to the CLIA regulations and is used for clinical purposes. .   Cortisol-am, blood     Status: None   Collection Time: 08/11/17  8:19 AM  Result Value Ref Range   Cortisol - AM 5.0 mcg/dL    Comment: Reference Range 8 a.m. (7-9  a.m.) Specimen: 4.0-22.0 .       Assessment & Plan:   1. Adrenal adenoma, right 2.  Cushing syndrome - Her history is significant for rapid weight gain of 60 pounds over the last 2 years.  -Radiologic features favoring benign adenoma. -24-hour urine free cortisol was elevated at 59 ,  Low-dose 1 mg dexamethasone suppression test is abnormal (a.m. 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 -I discussed the fact that several positive tests are needed before concluding a diagnosis of Cushing syndrome/disease. -She needs more workup to biochemically confirm Cushing syndrome and to separate etiologies, specifically to separate whether this is ACTH dependent or ACTH independent. -I will arrange for her to get late-night salivary cortisol, repeat 24-hour urine free cortisol, and a.m. cortisol/ACTH on same stump. - Her free plasma metanephrines are normal , indicating unlikely pheochromocytoma.   -If she has ACTH independent Cushing syndrome, she will be considered for resection of the functioning right adrenal adenoma. -If she is confirmed to have ACTH dependent Cushing's syndrome/disease she will need more workup to lateralize, preferably in tertiary care centers.   2. Goiter -  She does not have family history of thyroid cancer.  She has significant thyromegaly with no discrete nodules, no intervention at this time.    3. Morbid obesity ([place]) - This is her major health problem, it may be worsened by Cushing syndrome treatment of which will definitely help. - She will benefit from caloric restriction to lose weight. I also discussed exercise regimen which is appropriate for her to augment her dietary adjustments for weight loss.  -  Suggestion is made for her to avoid simple carbohydrates  from her diet including Cakes, Sweet Desserts / Pastries, Ice Cream, Soda (diet and regular), Sweet Tea, Candies, Chips, Cookies, Store Bought Juices, Alcohol in Excess of  1-2 drinks a day, Artificial Sweeteners, and "Sugar-free" Products. This will help patient to have stable blood glucose profile and potentially avoid unintended weight gain.  - I advised patient to maintain close follow up with Scot Jun, FNP for primary care needs.  Follow up plan: Return in about 2 weeks (around 08/30/2017) for follow up with pre-visit labs.   Glade Lloyd, MD [facility] [address] [place], [place] [address] Phone: [phone]  Fax: [phone]     08/16/2017, 6:17 PM  This note was partially dictated with voice recognition software. Similar sounding words can be transcribed inadequately or may not  be corrected upon review.

## 2017-08-19 ENCOUNTER — Encounter: Payer: Self-pay | Admitting: Family Medicine

## 2017-08-19 ENCOUNTER — Ambulatory Visit: Payer: No Typology Code available for payment source | Admitting: "Endocrinology

## 2017-08-21 ENCOUNTER — Ambulatory Visit (INDEPENDENT_AMBULATORY_CARE_PROVIDER_SITE_OTHER): Payer: Self-pay | Admitting: Emergency Medicine

## 2017-08-21 VITALS — BP 115/85 | HR 105 | Temp 98.0°F | Resp 16

## 2017-08-21 DIAGNOSIS — Z760 Encounter for issue of repeat prescription: Secondary | ICD-10-CM

## 2017-08-21 DIAGNOSIS — R300 Dysuria: Secondary | ICD-10-CM

## 2017-08-21 LAB — POCT URINALYSIS DIPSTICK
Bilirubin, UA: NEGATIVE
GLUCOSE UA: NEGATIVE
KETONES UA: NEGATIVE
LEUKOCYTES UA: NEGATIVE
NITRITE UA: NEGATIVE
PROTEIN UA: NEGATIVE
RBC UA: NEGATIVE
SPEC GRAV UA: 1.015 (ref 1.010–1.025)
Urobilinogen, UA: 0.2 E.U./dL
pH, UA: 5 (ref 5.0–8.0)

## 2017-08-21 MED ORDER — CIPROFLOXACIN HCL 500 MG PO TABS: 500.0000 mg | ORAL_TABLET | Freq: Two times a day (BID) | ORAL | 0 refills | Status: DC

## 2017-08-21 MED ORDER — ATOMOXETINE HCL 40 MG PO CAPS: 40.0000 mg | ORAL_CAPSULE | Freq: Every day | ORAL | 1 refills | Status: DC

## 2017-08-21 NOTE — Patient Instructions (Signed)

## 2017-08-21 NOTE — Progress Notes (Signed)
S: Paula King is a 37 y.o. female who presents for evaluation of dysuria and for medication refill. Dysuria has been ongoing for 2 days, described a lower abdominal pressure. Denies fever, chills, n/v/d or other systemic complaints.   For Medication refill, she is requesting strattera, states she has been stable on this medication for a long period of time without issue, taking for ADHD. States she has not been able to get in with PCP for face to face for refill.  Review of Systems  Constitutional: Negative for chills and fever.  Gastrointestinal: Positive for abdominal pain. Negative for constipation, diarrhea, nausea and vomiting.  Genitourinary: Negative for flank pain, frequency and urgency.  Musculoskeletal: Negative for myalgias.   O: Vitals:   08/21/17 1148  BP: 115/85  Pulse: (!) 105  Resp: 16  Temp: 98 F (36.7 C)  SpO2: 97%   Physical Exam  Constitutional: She appears well-developed and well-nourished. No distress.  Abdominal: Soft. Bowel sounds are normal. She exhibits no distension and no mass. There is no tenderness. There is no guarding and no CVA tenderness.  Neurological: She is alert.  Skin: Skin is warm and dry. Capillary refill takes less than 2 seconds. She is not diaphoretic.  Psychiatric: She has a normal mood and affect.  Nursing note and vitals reviewed.  1. Dysuria   2. Medication refill    P: 1. Dysuria -Given delay fill rx for cipro, follow up with PCP as needed. - POCT urinalysis dipstick  2. Medication refill Provided refill of her medication, encouraged follow up with PCP for further management as needed.

## 2017-08-22 ENCOUNTER — Other Ambulatory Visit: Payer: Self-pay | Admitting: Family Medicine

## 2017-08-22 DIAGNOSIS — F988 Other specified behavioral and emotional disorders with onset usually occurring in childhood and adolescence: Secondary | ICD-10-CM | POA: Insufficient documentation

## 2017-08-22 DIAGNOSIS — F909 Attention-deficit hyperactivity disorder, unspecified type: Secondary | ICD-10-CM

## 2017-08-22 MED ORDER — ATOMOXETINE HCL 40 MG PO CAPS: 40.0000 mg | ORAL_CAPSULE | Freq: Every day | ORAL | 2 refills | Status: DC

## 2017-08-22 MED FILL — ATOMOXETINE HCL 40 MG CAPS: 40 | 30 days supply | Qty: 30 | Fill #0

## 2017-08-22 MED FILL — CIPROFLOXACIN HCL 500 MG TA: 500 | 5 days supply | Qty: 10 | Fill #0

## 2017-08-22 NOTE — Progress Notes (Signed)
Refilled atomoxetine 40 mg and updated problem to reflect a dx of ADD

## 2017-08-23 ENCOUNTER — Other Ambulatory Visit: Payer: Self-pay | Admitting: "Endocrinology

## 2017-08-24 ENCOUNTER — Telehealth: Payer: Self-pay

## 2017-08-24 NOTE — Telephone Encounter (Signed)
Spoke with pt and pt states she is better.

## 2017-08-29 LAB — CORTISOL-AM, BLOOD: Cortisol - AM: 6.8 ug/dL (ref 6.2–19.4)

## 2017-08-29 LAB — CORTISOL, SALIVARY

## 2017-08-29 LAB — ACTH: ACTH: 11.9 pg/mL (ref 7.2–63.3)

## 2017-08-29 LAB — STATUS REPORT

## 2017-08-29 MED FILL — SAXENDA 18 MG/3 ML PEN: 18 | 30 days supply | Qty: 15 | Fill #2

## 2017-08-31 ENCOUNTER — Ambulatory Visit (INDEPENDENT_AMBULATORY_CARE_PROVIDER_SITE_OTHER): Payer: No Typology Code available for payment source | Admitting: "Endocrinology

## 2017-08-31 ENCOUNTER — Encounter: Payer: Self-pay | Admitting: "Endocrinology

## 2017-08-31 VITALS — BP 112/81 | HR 99 | Ht 64.0 in | Wt 251.0 lb

## 2017-08-31 DIAGNOSIS — D3501 Benign neoplasm of right adrenal gland: Secondary | ICD-10-CM

## 2017-08-31 MED FILL — UNIFINE PENTIPS 8MM 31G: 31G X 8 MM | 90 days supply | Qty: 100 | Fill #1

## 2017-08-31 NOTE — Progress Notes (Signed)
Consult Note                                            08/31/2017, 5:56 PM   Subjective:    Patient ID: Paula King, female    DOB: 1980/07/08, PCP Paula Jun, FNP   Past Medical History:  Diagnosis Date  . Diverticulitis   . Hypertension    Past Surgical History:  Procedure Laterality Date  . CESAREAN SECTION    . CHOLECYSTECTOMY    . WISDOM TOOTH EXTRACTION     Social History   Socioeconomic History  . Marital status: Single    Spouse name: None  . Number of children: None  . Years of education: None  . Highest education level: None  Social Needs  . Financial resource strain: None  . Food insecurity - worry: None  . Food insecurity - inability: None  . Transportation needs - medical: None  . Transportation needs - non-medical: None  Occupational History  . None  Tobacco Use  . Smoking status: Never Smoker  . Smokeless tobacco: Never Used  Substance and Sexual Activity  . Alcohol use: No  . Drug use: No  . Sexual activity: Yes    Birth control/protection: None  Other Topics Concern  . None  Social History Narrative  . None   Outpatient Encounter Medications as of 08/31/2017  Medication Sig  . anastrozole (ARIMIDEX) 1 MG tablet Take 1 mg by mouth daily.  Marland Kitchen atenolol (TENORMIN) 25 MG tablet Take 1 tablet (25 mg total) by mouth daily.  . ATENOLOL PO Take 25 mg by mouth.  Marland Kitchen atomoxetine (STRATTERA) 40 MG capsule Take 1 capsule (40 mg total) by mouth daily.  . cetirizine (ZYRTEC) 10 MG tablet Take 1 tablet (10 mg total) by mouth daily.  . ranitidine (ZANTAC) 75 MG tablet Take 1 tablet (75 mg total) by mouth daily as needed for heartburn.  . [DISCONTINUED] ciprofloxacin (CIPRO) 500 MG tablet Take 1 tablet (500 mg total) by mouth 2 (two) times daily.   No facility-administered encounter medications on file as of 08/31/2017.    ALLERGIES: Allergies  Allergen Reactions  . Ciprofloxacin     IV Only-Patient started sweating and becoming clammy when  was infused with IV cipro on 08/09/14. Can tolerate PO Ciprofloxacin     VACCINATION STATUS:  There is no immunization history on file for this patient.  HPI Paula King is 37 y.o. female who presents today with a medical history as above. she is being seen in follow up for right adrenal adenoma . - Her history starts in February 2016 when she underwent CT scan of her abdomen due to left lower quadrant pain which incidentally showed 2.7 cm mass  on her right adrenal gland which was reported to be consistent with adenoma.  - Repeat CT abdomen in November 2018 revealed that the adenoma persisted and measured 3 cm ( HU 0) still favoring adenoma. -   she has been dealing with symptoms of  progressive weight gain of 60 pounds over the last 2 years associated with fatigue. - She has history of prediabetes, hypertension which required 1 medication with atenolol 25 mg by mouth daily. - She reports positively for easy bruising. She denies family history of adrenal, pituitary, parathyroid, nor pancreatic dysfunctions. - She has 1 child, 60 years of age. -  She reports that she has tried to lose weight by increasing her exercise and dietary modifications. - She denies diaphoresis, uncontrolled hypertension, headaches. -She was sent for 24-hour urine studies which revealed free cortisol of 59 elevated (normal 0-50) and elevated 24-hour urine normetanephrine at 750 (normal 82-500 mcg per 24 hours). - she underwent low-dose dexamethasone suppression test which was abnormal; plasma free metanephrines were normal. -Since her last visit, she underwent repeat ACTH/cortisol levels measurements which are unremarkable.  Midnight salivary cortisol concentration was not measured due to insufficient sample. -She has no new complaints today. Review of Systems  Constitutional: + Recently lost 14 pounds ,  + fatigue, no subjective hyperthermia, no subjective hypothermia Eyes: no blurry vision, no xerophthalmia ENT:  no sore throat, no nodules palpated in throat, no dysphagia/odynophagia, no hoarseness Cardiovascular: no Chest Pain, no Shortness of Breath, no palpitations, no leg swelling Respiratory: no cough, no SOB Gastrointestinal: no Nausea/Vomiting/Diarhhea Musculoskeletal: no muscle/joint aches Skin: no rashes Neurological: no tremors, no numbness, no tingling, no dizziness Psychiatric: no depression, no anxiety  Objective:    BP 112/81   Pulse 99   Ht 5\' 4"  (1.626 m)   Wt 251 lb (113.9 kg)   BMI 43.08 kg/m   Wt Readings from Last 3 Encounters:  08/31/17 251 lb (113.9 kg)  08/16/17 252 lb (114.3 kg)  08/03/17 251 lb (113.9 kg)    Physical Exam  Constitutional: + Obese, not in acute distress, normal state of mind Eyes: PERRLA, EOMI, no exophthalmos ENT: moist mucous membranes, + palpable thyromegaly , no cervical lymphadenopathy, sternoclavicular fossae full bilaterally Cardiovascular: normal precordial activity, Regular Rate and Rhythm, no Murmur/Rubs/Gallops Respiratory:  adequate breathing efforts, no gross chest deformity, Clear to auscultation bilaterally Gastrointestinal: abdomen soft, Non -tender, No distension, Bowel Sounds present, + purplish striae present . Musculoskeletal: no gross deformities, strength intact in all four extremities Skin: moist, warm, no rashes, + small doso-cervical fat pad present   Neurological: no tremor with outstretched hands, Deep tendon reflexes normal in all four extremities.  CMP ( most recent) CMP     Component Value Date/Time   NA 147 (H) 04/18/2017 1041   K 4.5 04/18/2017 1041   CL 100 04/18/2017 1041   CO2 20 04/18/2017 1041   GLUCOSE 103 (H) 04/18/2017 1041   BUN 11 04/18/2017 1041   CREATININE 1.11 (H) 04/18/2017 1041   CALCIUM 9.4 04/18/2017 1041   PROT 7.3 04/18/2017 1041   ALBUMIN 3.1 (L) 08/10/2014 0148   AST 26 04/18/2017 1041   ALT 30 (H) 04/18/2017 1041   ALKPHOS 77 08/10/2014 0148   BILITOT 0.5 04/18/2017 1041    GFRNONAA 64 04/18/2017 1041   GFRAA 74 04/18/2017 1041     Diabetic Labs (most recent): Lab Results  Component Value Date   HGBA1C 5.4 05/24/2017     Lipid Panel ( most recent) Lipid Panel     Component Value Date/Time   CHOL 141 04/18/2017 1041   TRIG 115 04/18/2017 1041   HDL 46 (L) 04/18/2017 1041   CHOLHDL 3.1 04/18/2017 1041      Lab Results  Component Value Date   TSH 1.83 04/18/2017   Results for ABREY, BRADWAY (MRN [id number]) as of 08/31/2017 18:05  Ref. Range 05/24/2017 16:18 08/06/2017 09:41 08/11/2017 08:19 08/23/2017 09:04  ACTH Latest Ref Range: 7.2 - 63.3 pg/mL    11.9  Cortisol - AM Latest Ref Range: 6.2 - 19.4 ug/dL   5.0 6.8  Metanephrine, Pl Latest Ref Range: <=57 pg/mL  <  25    Normetanephrine, Pl Latest Ref Range: <=148 pg/mL  141    Hemoglobin A1C Unknown 5.4        05/10/2017   CT abdomen  Radiology description of her right adrenal mass: Adrenal: Previously noted right adrenal mass is similar to the prior examination measuring 3.0 x 2.6 cm and is diffusely low-attenuation (0 HU), compatible with an adenoma. Left adrenal gland and bilateral kidneys are normal in appearance. No hydroureteronephrosis in the visualized portions of the abdomen.    Assessment & Plan:   1. Adrenal adenoma, right 2.  Cushing syndrome - Her history is significant for rapid weight gain of 60 pounds over the last 2 years, although recently she is losing. -Radiologic features favoring benign adenoma. -24-hour urine free cortisol was elevated at 59 ,  Low-dose 1 mg dexamethasone suppression test is abnormal (a.m. 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 -I discussed the fact that several positive tests are needed before concluding a diagnosis of Cushing syndrome/disease. -Her subsequent attempt for late-night salivary cortisol-was not measured due to insufficient sample. - Her  repeat a.m. cortisol/ACTH -are normal with a.m.  cortisol of 6.8 and ACTH at 11.9-indicating absence of ACTH dependent Cushing's syndrome. -At this time she has discrete point lab findings between visits and a diagnosis of Cushing's syndrome/disease is not settled. -I had a long discussion with her regarding the need for adequate and proper testing before reaching a diagnosis. -  Her free plasma metanephrines are normal , indicating unlikely pheochromocytoma.   -She needs more workup to biochemically confirm Cushing syndrome and to separate etiologies, specifically to separate whether this is ACTH dependent or ACTH independent. -I plan to repeat 24-hour urine free cortisol in 8 weeks time with office visit.  -If she has ACTH independent Cushing syndrome, she will be considered for resection of the functioning right adrenal adenoma. -If she is confirmed to have ACTH dependent Cushing's syndrome/disease she will need more workup to lateralize, preferably in tertiary care centers.   2. Goiter - She does not have family history of thyroid cancer.  She has significant thyromegaly with no discrete nodules, no intervention at this time.    3. Morbid obesity ([place]) - This is her major health problem, it may be worsened by Cushing syndrome treatment of which will definitely help. - She will benefit from caloric restriction to lose weight. I also discussed exercise regimen which is appropriate for her to augment her dietary adjustments for weight loss.   -  Suggestion is made for her to avoid simple carbohydrates  from her diet including Cakes, Sweet Desserts / Pastries, Ice Cream, Soda (diet and regular), Sweet Tea, Candies, Chips, Cookies, Store Bought Juices, Alcohol in Excess of  1-2 drinks a day, Artificial Sweeteners, and "Sugar-free" Products. This will help  avoid unintended weight gain.   - I advised patient to maintain close follow up with Paula Jun, FNP for primary care needs.  Follow  up plan: Return in about 8 weeks (around 10/26/2017) for 24 hour urine free Cortisol.   Glade Lloyd, MD [facility] [address] [place], [place] [address] Phone: [phone]  Fax: [phone]     08/31/2017, 5:56 PM  This note was partially dictated with voice recognition software. Similar sounding words can be transcribed inadequately or may not  be corrected upon review.

## 2017-08-31 NOTE — Patient Instructions (Signed)

## 2017-09-08 ENCOUNTER — Encounter (INDEPENDENT_AMBULATORY_CARE_PROVIDER_SITE_OTHER): Payer: No Typology Code available for payment source

## 2017-09-21 MED FILL — ANASTROZOLE 1 MG TABLET: 1 | 30 days supply | Qty: 30 | Fill #2

## 2017-09-21 MED FILL — ATOMOXETINE HCL 60 MG CAPS: 60 | 90 days supply | Qty: 90 | Fill #0

## 2017-09-28 ENCOUNTER — Ambulatory Visit (INDEPENDENT_AMBULATORY_CARE_PROVIDER_SITE_OTHER): Payer: No Typology Code available for payment source | Admitting: Family Medicine

## 2017-10-26 ENCOUNTER — Ambulatory Visit: Payer: No Typology Code available for payment source | Admitting: "Endocrinology

## 2017-11-04 ENCOUNTER — Ambulatory Visit: Payer: No Typology Code available for payment source | Admitting: "Endocrinology

## 2017-11-04 MED FILL — ANASTROZOLE 1 MG TABLET: 1 | 30 days supply | Qty: 30 | Fill #3

## 2017-11-04 MED FILL — ATENOLOL 25 MG TABLET: 25 | 90 days supply | Qty: 90 | Fill #1

## 2017-11-18 ENCOUNTER — Ambulatory Visit (INDEPENDENT_AMBULATORY_CARE_PROVIDER_SITE_OTHER): Payer: No Typology Code available for payment source | Admitting: Family Medicine

## 2017-11-18 ENCOUNTER — Encounter: Payer: Self-pay | Admitting: Family Medicine

## 2017-11-18 VITALS — BP 110/82 | HR 78 | Resp 16 | Ht 64.0 in | Wt 245.0 lb

## 2017-11-18 DIAGNOSIS — R7303 Prediabetes: Secondary | ICD-10-CM | POA: Diagnosis not present

## 2017-11-18 DIAGNOSIS — E559 Vitamin D deficiency, unspecified: Secondary | ICD-10-CM

## 2017-11-18 DIAGNOSIS — D649 Anemia, unspecified: Secondary | ICD-10-CM | POA: Diagnosis not present

## 2017-11-18 DIAGNOSIS — R739 Hyperglycemia, unspecified: Secondary | ICD-10-CM | POA: Diagnosis not present

## 2017-11-18 DIAGNOSIS — R5383 Other fatigue: Secondary | ICD-10-CM | POA: Diagnosis not present

## 2017-11-18 DIAGNOSIS — R400 Somnolence: Secondary | ICD-10-CM | POA: Diagnosis not present

## 2017-11-18 NOTE — Progress Notes (Signed)
Patient ID: Paula King, female    DOB: December 01, 1980, 36 y.o.   MRN: [id number]  Chief Complaint  Patient presents with  . Establish Care    states she stays fatigued all the time. Seeing an endo for cushings but she doesnt know if its related     Allergies Ciprofloxacin  Subjective:   Paula King is a 37 y.o. female who presents to [facility] today.  HPI Paula King is a very pleasant single white female who presents today for a visit to establish care.  She is currently being evaluated by Dr. Dorris Fetch and endocrinology for adrenal adenoma/Cushing's syndrome.  She did miss her recent office visit with him and is going to schedule her follow-up visit.  She is due to get 24-hour urine testing done as part of her work-up for Cushing's disease/syndrome.  She has not yet completed these test.  She reports that over the past 3 months she has had increased fatigue.  She reports that since February/2019 that she has lost 20 pounds.  She has been eating a healthier diet and has been exercising by swimming several days a week.  She reports that her 73-year-old daughter has noticed that her energy level has worsened.  She reports that she goes to bed at approximately 9:30 PM at night and gets up around 6:30 AM.  She reports that she still experiences daytime fatigue and somnolence.  She did recently start a new job 3 weeks ago but reports that she is very happy with her job.  She reports that her working environment is less stressful.  She reports she does not feel down, depressed, or hopeless.  She is continuing to try to lose weight and wishes to get to a goal weight of less than 200 pounds.  She is taking a beta-blocker for elevated heart rate.  She has been on this medication for several years. She denies any chest pain, shortness of breath, or swelling in her extremities.  She is unsure if she snores at night.  She did have a recent thyroid ultrasound which revealed diffuse thyroid  hypertrophy.  There were no discrete thyroid nodules.  For the past week she has been on a ketogenic diet.  She does report she consumes enough calories.  She denies any fevers, chills, nausea, vomiting, night sweats, or diarrhea. She is currently on a medication to help with her menses.  She recently started back having menstrual cycles about 3 to 4 months ago.  She reports her periods are heavy but they only last 4 days.  She denies any pain or problems with them otherwise.  She does have a history of anemia in the past.   She is interested in getting a prescription for Belviq for weight loss.  She reports that she has been on this medication in the past.  She reports she took it several years ago and did lose a few pounds but was not actively participating in exercise or healthy eating.  She understands that she cannot be on an appetite suppressant due to her work-up/questionable diagnosis of Cushing's syndrome.   Past Medical History:  Diagnosis Date  . Diverticulitis   . Hypertension     Past Surgical History:  Procedure Laterality Date  . CESAREAN SECTION    . CHOLECYSTECTOMY    . WISDOM TOOTH EXTRACTION      Family History  Problem Relation Age of Onset  . Hypertension Father   . Diabetes Father   .  Anxiety disorder Mother   . Depression Mother   . Cancer Sister        throat      Social History   Socioeconomic History  . Marital status: Single    Spouse name: Not on file  . Number of children: Not on file  . Years of education: Not on file  . Highest education level: Not on file  Occupational History  . Not on file  Social Needs  . Financial resource strain: Not hard at all  . Food insecurity:    Worry: Never true    Inability: Never true  . Transportation needs:    Medical: No    Non-medical: No  Tobacco Use  . Smoking status: Never Smoker  . Smokeless tobacco: Never Used  Substance and Sexual Activity  . Alcohol use: No  . Drug use: No  . Sexual  activity: Yes    Birth control/protection: None  Lifestyle  . Physical activity:    Days per week: 2 days    Minutes per session: 30 min  . Stress: Only a little  Relationships  . Social connections:    Talks on phone: More than three times a week    Gets together: More than three times a week    Attends religious service: Never    Active member of club or organization: Not on file    Attends meetings of clubs or organizations: Never    Relationship status: Never married  Other Topics Concern  . Not on file  Social History Narrative   Lives in [place], [place].   Single, not dating.    Has one child, age 44, 35.   Has family in [place], [place].   Enjoys swimming.   Enjoys time with family.    Eats all foods.   Wears seatbelt.     Review of Systems  Constitutional: Positive for fatigue. Negative for activity change, appetite change, chills, diaphoresis, fever and unexpected weight change.  HENT: Negative for congestion, facial swelling, sore throat, trouble swallowing and voice change.   Respiratory: Negative for cough, choking, chest tightness, shortness of breath, wheezing and stridor.   Cardiovascular: Negative for chest pain and leg swelling.       She does experience palpitations if she misses her beta-blocker.  She takes her beta-blocker in the morning.  Gastrointestinal: Negative for abdominal distention, abdominal pain, diarrhea, nausea and vomiting.  Genitourinary: Negative for decreased urine volume, frequency and hematuria.       Menses monthly. Heavy. Last four days.   Musculoskeletal: Negative for back pain, myalgias, neck pain and neck stiffness.  Skin: Negative for rash.  Neurological: Negative for dizziness, tremors, facial asymmetry, weakness, light-headedness and headaches.  Psychiatric/Behavioral: Negative for decreased concentration, dysphoric mood and self-injury. The patient is not nervous/anxious.      Objective:   BP 110/82   Pulse 78   Resp 16    Ht 5\' 4"  (1.626 m)   Wt 245 lb (111.1 kg)   SpO2 98%   BMI 42.05 kg/m   Physical Exam  Constitutional: She is oriented to person, place, and time. She appears well-developed and well-nourished.  HENT:  Head: Normocephalic and atraumatic.  Nose: Nose normal.  Mouth/Throat: Oropharynx is clear and moist. No oropharyngeal exudate.  Eyes: Pupils are equal, round, and reactive to light. Conjunctivae and EOM are normal. No scleral icterus.  Neck: Normal range of motion. Neck supple.  Thyroid is enlarged without palpable nodules.  Cardiovascular: Normal rate, regular  rhythm, normal heart sounds and intact distal pulses.  Pulmonary/Chest: Effort normal and breath sounds normal. No respiratory distress.  Abdominal: Soft. Bowel sounds are normal.  Neurological: She is alert and oriented to person, place, and time. No cranial nerve deficit.  Skin: Skin is warm and dry. Capillary refill takes less than 2 seconds. No erythema.  Psychiatric: She has a normal mood and affect. Her behavior is normal. Judgment and thought content normal.  Vitals reviewed.  Depression screen [facility] 2/9 11/18/2017 07/22/2017 07/19/2017 05/24/2017 04/18/2017  Decreased Interest 1 0 0 3 3  Down, Depressed, Hopeless 0 1 0 2 2  PHQ - 2 Score 1 1 0 5 5  Altered sleeping 1 - - 3 3  Tired, decreased energy 1 - - 3 2  Change in appetite 0 - - 3 3  Feeling bad or failure about yourself  0 - - 2 0  Trouble concentrating 0 - - 3 3  Moving slowly or fidgety/restless 0 - - 0 0  Suicidal thoughts 0 - - 0 0  PHQ-9 Score 3 - - 19 16  Difficult doing work/chores Somewhat difficult - - - -    Assessment and Plan  1. Fatigue, unspecified type Uncertain etiology of fatigue at this time.  Will rule out metabolic disturbances.  Patient's body habitus could be indicative of underlying sleep disorder/obstructive sleep apnea.  Will obtain sleep study at this time. - Vitamin B12 - Nocturnal polysomnography (NPSG); Future  2. Anemia,  unspecified type History of anemia.  Now with monthly menstrual cycles which are somewhat heavy, she is predisposed to anemia.  Check CBC and iron panels at this time. - CBC with Differential/Platelet - Iron, TIBC and Ferritin Panel  3. Vitamin D deficiency History of vitamin D deficiency in the past.  Check lab - VITAMIN D 25 Hydroxy (Vit-D Deficiency, Fractures)  4. Elevated blood sugar/prediabetes Check hemoglobin A1c at this time.  Continue with diet, exercise, and weight loss modifications. 6. Somnolence, daytime  - Nocturnal polysomnography (NPSG); Future   I did discuss with patient that I would look into the Belviq.  I did discuss with her in detail that I believe her fatigue and symptoms could be also related to her underlying possible endocrine disorder.  She does need to follow-up with Dr. Dorris Fetch for continued evaluation of possible adrenal issues.  She does not appear to be depressed or have a mood disorder at this time.  She was encouraged to continue with her diet and exercise routine.  She will follow-up in 1 month for complete physical exam.  I will look into Belviq prior to that visit and we will discuss at that time.  She will schedule office visit with [facility] for evaluation.  Patient's questions and concerns were answered.  She will get labs done today.  She will get the 24-hour urine to complete over the weekend and turn into Dr. Dorris Fetch.  She will call and schedule endocrine appointment. We will request employee health immunization records. Recommend hepatitis A vaccination Return in about 1 month (around 12/16/2017) for CPE. Caren Macadam, MD 11/18/2017

## 2017-11-18 NOTE — Patient Instructions (Signed)
Request immunization records from [facility] in less than a month I will check on Belviq You get your 24 hour urine/test and follow up with Dr. Dorris Fetch. I will place order for sleep study.

## 2017-11-19 LAB — VITAMIN B12: Vitamin B-12: 320 pg/mL (ref 200–1100)

## 2017-11-19 LAB — CBC WITH DIFFERENTIAL/PLATELET
Basophils Absolute: 49 cells/uL (ref 0–200)
Basophils Relative: 0.7 %
EOS PCT: 1.6 %
Eosinophils Absolute: 112 cells/uL (ref 15–500)
HEMATOCRIT: 41.9 % (ref 35.0–45.0)
Hemoglobin: 14.2 g/dL (ref 11.7–15.5)
LYMPHS ABS: 2576 {cells}/uL (ref 850–3900)
MCH: 29.9 pg (ref 27.0–33.0)
MCHC: 33.9 g/dL (ref 32.0–36.0)
MCV: 88.2 fL (ref 80.0–100.0)
MPV: 9.1 fL (ref 7.5–12.5)
Monocytes Relative: 6.6 %
NEUTROS PCT: 54.3 %
Neutro Abs: 3801 cells/uL (ref 1500–7800)
Platelets: 325 10*3/uL (ref 140–400)
RBC: 4.75 10*6/uL (ref 3.80–5.10)
RDW: 13 % (ref 11.0–15.0)
Total Lymphocyte: 36.8 %
WBC mixed population: 462 cells/uL (ref 200–950)
WBC: 7 10*3/uL (ref 3.8–10.8)

## 2017-11-19 LAB — IRON,TIBC AND FERRITIN PANEL
%SAT: 24 % (ref 11–50)
Ferritin: 38 ng/mL (ref 10–154)
Iron: 71 ug/dL (ref 40–190)
TIBC: 302 ug/dL (ref 250–450)

## 2017-11-19 LAB — HEMOGLOBIN A1C
EAG (MMOL/L): 6 (calc)
HEMOGLOBIN A1C: 5.4 %{Hb} (ref <5.7)
Mean Plasma Glucose: 108 (calc)

## 2017-11-19 LAB — VITAMIN D 25 HYDROXY (VIT D DEFICIENCY, FRACTURES): Vit D, 25-Hydroxy: 42 ng/mL (ref 30–100)

## 2017-11-21 ENCOUNTER — Encounter: Payer: Self-pay | Admitting: Family Medicine

## 2017-11-28 ENCOUNTER — Other Ambulatory Visit: Payer: Self-pay | Admitting: "Endocrinology

## 2017-12-01 LAB — CORTISOL, URINE, FREE
Cortisol (Ur), Free: 14 ug/24 hr (ref 6–42)
Cortisol,F,ug/L,U: 18 ug/L

## 2017-12-09 ENCOUNTER — Ambulatory Visit: Payer: No Typology Code available for payment source | Admitting: "Endocrinology

## 2017-12-09 ENCOUNTER — Encounter: Payer: Self-pay | Admitting: "Endocrinology

## 2017-12-09 VITALS — BP 132/86 | HR 74 | Ht 64.0 in | Wt 250.0 lb

## 2017-12-09 DIAGNOSIS — D3501 Benign neoplasm of right adrenal gland: Secondary | ICD-10-CM | POA: Diagnosis not present

## 2017-12-09 DIAGNOSIS — E049 Nontoxic goiter, unspecified: Secondary | ICD-10-CM | POA: Diagnosis not present

## 2017-12-09 NOTE — Progress Notes (Signed)
Endocrinology follow-up  Note                                            12/09/2017, 12:02 PM   Subjective:    Patient ID: Paula King, female    DOB: [birth year], PCP Scot Jun, FNP   Past Medical History:  Diagnosis Date  . Diverticulitis   . Hypertension    Past Surgical History:  Procedure Laterality Date  . CESAREAN SECTION    . CHOLECYSTECTOMY    . WISDOM TOOTH EXTRACTION     Social History   Socioeconomic History  . Marital status: Single    Spouse name: Not on file  . Number of children: Not on file  . Years of education: Not on file  . Highest education level: Not on file  Occupational History  . Not on file  Social Needs  . Financial resource strain: Not hard at all  . Food insecurity:    Worry: Never true    Inability: Never true  . Transportation needs:    Medical: No    Non-medical: No  Tobacco Use  . Smoking status: Never Smoker  . Smokeless tobacco: Never Used  Substance and Sexual Activity  . Alcohol use: No  . Drug use: No  . Sexual activity: Yes    Birth control/protection: None  Lifestyle  . Physical activity:    Days per week: 2 days    Minutes per session: 30 min  . Stress: Only a little  Relationships  . Social connections:    Talks on phone: More than three times a week    Gets together: More than three times a week    Attends religious service: Never    Active member of club or organization: Not on file    Attends meetings of clubs or organizations: Never    Relationship status: Never married  Other Topics Concern  . Not on file  Social History Narrative   Lives in [place], [place].   Single, not dating.    Has one child, age 21, 71.   Has family in [place], [place].   Enjoys swimming.   Enjoys time with family.    Eats all foods.   Wears seatbelt.    Outpatient Encounter Medications as of 12/09/2017  Medication Sig  . anastrozole (ARIMIDEX) 1 MG tablet Take 1 mg by mouth daily.  Marland Kitchen atenolol (TENORMIN) 25  MG tablet Take 1 tablet (25 mg total) by mouth daily.  Marland Kitchen atomoxetine (STRATTERA) 60 MG capsule Take 60 mg by mouth daily.  . cetirizine (ZYRTEC) 10 MG tablet Take 1 tablet (10 mg total) by mouth daily.  . ranitidine (ZANTAC) 75 MG tablet Take 1 tablet (75 mg total) by mouth daily as needed for heartburn.   No facility-administered encounter medications on file as of 12/09/2017.    ALLERGIES: Allergies  Allergen Reactions  . Ciprofloxacin     IV Only-Patient started sweating and becoming clammy when was infused with IV cipro on 08/09/14. Can tolerate PO Ciprofloxacin     VACCINATION STATUS:  There is no immunization history on file for this patient.  HPI Paula King is 37 y.o. female who presents today with a medical history as above. she is being seen in follow up for right adrenal adenoma . - Her history starts in February 2016 when she underwent  CT scan of her abdomen due to left lower quadrant pain which incidentally showed 2.7 cm mass  on her right adrenal gland which was reported to be consistent with adenoma. - Repeat CT abdomen in November 2018 revealed that the adenoma persisted and measured 3 cm ( HU 0) still favoring adenoma. -She underwent hormonal work-up to determine functional status of this adenoma.  Findings have not been conclusive, except for one instance of 24-hour urine free cortisol at 59.  -She was put on observation, with advised to lose weight by caloric restrictions. -Her repeat 24-hour urine free cortisol is normal at 14. -After she had a period of progressive weight gain, over the last 6 months she lost 17 pounds. -She still complains of fatigue, likely related to her sleep apnea.  - She has history of prediabetes, hypertension which required 1 medication with atenolol 25 mg by mouth daily. - She reports positively for easy bruising. She denies family history of adrenal, pituitary, parathyroid, nor pancreatic dysfunctions. - She has 1 child, 34 years of  age. - She reports that she has tried to lose weight by increasing her exercise and dietary modifications. - She denies diaphoresis, uncontrolled hypertension, headaches. -Her 24-hour  urine studies which revealed normetanephrine at 750 (normal 82-500 mcg per 24 hours)-not diagnostic of pheochromocytoma. -  plasma free metanephrines were normal. -  Midnight salivary cortisol concentration was not measured due to insufficient sample. -She has no new complaints today.  Review of Systems  Constitutional: + Recently lost 17 pounds ,  + fatigue, no subjective hyperthermia nor hypothermia.    Eyes: no blurry vision, no xerophthalmia ENT: no sore throat, + goiter confirmed by ultrasound, no dysphagia, no odynophagia.   Cardiovascular: no Chest Pain, no Shortness of Breath, no palpitations, no leg swelling Respiratory: no cough, no SOB Gastrointestinal: no Nausea/Vomiting/Diarhhea Musculoskeletal: no muscle/joint aches Skin: no rashes Neurological: no tremors, no numbness, no tingling, no dizziness Psychiatric: no depression, no anxiety  Objective:    BP 132/86   Pulse 74   Ht 5\' 4"  (1.626 m)   Wt 250 lb (113.4 kg)   BMI 42.91 kg/m   Wt Readings from Last 3 Encounters:  12/09/17 250 lb (113.4 kg)  11/18/17 245 lb (111.1 kg)  08/31/17 251 lb (113.9 kg)    Physical Exam  Constitutional: + Obese, not in acute distress, normal state of mind.   Eyes: PERRLA, EOMI, no exophthalmos ENT: moist mucous membranes, + palpable thyromegaly , no cervical lymphadenopathy, sternoclavicular fossae full bilaterally Cardiovascular: normal precordial activity, Regular Rate and Rhythm, no Murmur/Rubs/Gallops . Musculoskeletal: no gross deformities, strength intact in all four extremities Skin: moist, warm, no rashes, + small doso-cervical fat pad present  , no violaceous striae appreciated. Neurological: no tremor with outstretched hands  CMP ( most recent) CMP     Component Value Date/Time   NA  147 (H) 04/18/2017 1041   K 4.5 04/18/2017 1041   CL 100 04/18/2017 1041   CO2 20 04/18/2017 1041   GLUCOSE 103 (H) 04/18/2017 1041   BUN 11 04/18/2017 1041   CREATININE 1.11 (H) 04/18/2017 1041   CALCIUM 9.4 04/18/2017 1041   PROT 7.3 04/18/2017 1041   ALBUMIN 3.1 (L) 08/10/2014 0148   AST 26 04/18/2017 1041   ALT 30 (H) 04/18/2017 1041   ALKPHOS 77 08/10/2014 0148   BILITOT 0.5 04/18/2017 1041   GFRNONAA 64 04/18/2017 1041   GFRAA 74 04/18/2017 1041     Diabetic Labs (most recent): Lab Results  Component Value Date   HGBA1C 5.4 11/18/2017   HGBA1C 5.4 05/24/2017     Lipid Panel ( most recent) Lipid Panel     Component Value Date/Time   CHOL 141 04/18/2017 1041   TRIG 115 04/18/2017 1041   HDL 46 (L) 04/18/2017 1041   CHOLHDL 3.1 04/18/2017 1041   LDLCALC 75 04/18/2017 1041      Lab Results  Component Value Date   TSH 1.83 04/18/2017    Most recent 24-hour urine free cortisol on November 28, 2017 was 14 improving from 21.  05/10/2017   CT abdomen  Radiology description of her right adrenal mass: Adrenal: Previously noted right adrenal mass is similar to the prior examination measuring 3.0 x 2.6 cm and is diffusely low-attenuation (0 HU), compatible with an adenoma. Left adrenal gland and bilateral kidneys are normal in appearance. No hydroureteronephrosis in the visualized portions of the abdomen.    Assessment & Plan:   1. Adrenal adenoma, right  -3 cm stable adenoma.  Radiologic features favoring benign adenoma. -24-hour urine free cortisol is 14 on November 28, 2017 improving from prior study of 59.   -This is indicative of Cushing's syndrome to be unlikely in her presentation. -I discussed the fact that several positive tests are needed before concluding a diagnosis of Cushing syndrome/disease.  - Her repeat a.m. cortisol/ACTH -are normal with a.m.  cortisol of 6.8 and ACTH at 11.9-indicating absence of ACTH dependent Cushing's syndrome. -She will not  require immediate intervention at this time.  She will be considered for repeat overnight dexamethasone suppression test on her next visit as necessary.   2. Goiter - She does not have family history of thyroid cancer.  She has significant thyromegaly with no discrete nodules, no intervention at this time.   -She will have repeat thyroid function test before her next visit in 1 year.  3. Morbid obesity ([place]) - This is her major health problem, slowly achieving some weight loss.  Her recent A1c was 5.4% indicating absence of prediabetes/diabetes.   -She will continue to benefit from caloric restrictions and to lose more weight.    -  Suggestion is made for her to avoid simple carbohydrates  from her diet including Cakes, Sweet Desserts / Pastries, Ice Cream, Soda (diet and regular), Sweet Tea, Candies, Chips, Cookies, Store Bought Juices, Alcohol in Excess of  1-2 drinks a day, Artificial Sweeteners, and "Sugar-free" Products. This will help patient to have stable blood glucose profile and potentially avoid unintended weight gain.  - I advised patient to maintain close follow up with Scot Jun, FNP for primary care needs.  Follow up plan: Return in about 1 year (around 12/10/2018) for follow up with pre-visit labs.   Glade Lloyd, MD [facility] [address] [place], [place] [address] Phone: [phone]  Fax: [phone]     12/09/2017, 12:02 PM  This note was partially dictated with voice recognition software. Similar sounding words can be transcribed inadequately or may not  be corrected upon review.

## 2017-12-21 ENCOUNTER — Telehealth: Payer: Self-pay | Admitting: Family Medicine

## 2017-12-21 NOTE — Telephone Encounter (Signed)
[phone] Patient left voicemail requesting a call back or a cpe anytime by fri if a cancellation occurs. Spoke w/ patient and she is aware we do not have availability at this time.

## 2017-12-22 ENCOUNTER — Ambulatory Visit (INDEPENDENT_AMBULATORY_CARE_PROVIDER_SITE_OTHER): Payer: Self-pay | Admitting: Family Medicine

## 2017-12-22 VITALS — BP 120/80 | HR 71 | Temp 97.4°F | Resp 16 | Ht 64.0 in | Wt 250.0 lb

## 2017-12-22 DIAGNOSIS — Z0000 Encounter for general adult medical examination without abnormal findings: Secondary | ICD-10-CM

## 2017-12-22 NOTE — Progress Notes (Signed)
Paula King is a 37 y.o. female who presents today with concerns of need for a annual physical exam. She has the chronic conditions of htn- managed by PCP. She denies any chronic of acute issues that she would like addressed today.  Review of Systems  Constitutional: Negative for chills, fever and malaise/fatigue.  HENT: Negative for congestion, ear discharge, ear pain, sinus pain and sore throat.   Eyes: Negative.   Respiratory: Negative for cough, sputum production and shortness of breath.   Cardiovascular: Negative.  Negative for chest pain.  Gastrointestinal: Negative for abdominal pain, diarrhea, nausea and vomiting.  Genitourinary: Negative for dysuria, frequency, hematuria and urgency.  Musculoskeletal: Negative for myalgias.  Skin: Negative.   Neurological: Negative for headaches.  Endo/Heme/Allergies: Negative.   Psychiatric/Behavioral: Negative.     O: Vitals:   12/22/17 2015  BP: 120/80  Pulse: 71  Resp: 16  Temp: (!) 97.4 F (36.3 C)  SpO2: 98%     Physical Exam  Constitutional: She is oriented to person, place, and time. Vital signs are normal. She appears well-developed and well-nourished. She is active.  Non-toxic appearance. She does not have a sickly appearance.  HENT:  Head: Normocephalic.  Right Ear: Hearing, tympanic membrane, external ear and ear canal normal.  Left Ear: Hearing, tympanic membrane, external ear and ear canal normal.  Nose: Nose normal.  Mouth/Throat: Uvula is midline and oropharynx is clear and moist.  Neck: Normal range of motion. Neck supple.  Cardiovascular: Normal rate, regular rhythm, normal heart sounds and normal pulses.  Pulmonary/Chest: Effort normal and breath sounds normal.  Abdominal: Soft. Bowel sounds are normal.  Musculoskeletal: Normal range of motion.  Lymphadenopathy:       Head (right side): No submental and no submandibular adenopathy present.       Head (left side): No submental and no submandibular adenopathy  present.    She has no cervical adenopathy.  Neurological: She is alert and oriented to person, place, and time.  Psychiatric: She has a normal mood and affect. Her speech is normal and behavior is normal. Cognition and memory are normal.  PHQ-9- negative  Vitals reviewed.  A: 1. Physical exam    P: Exam findings, diagnosis etiology and medication use and indications reviewed with patient. Follow- Up and discharge instructions provided. No emergent/urgent issues found on exam.  Patient verbalized understanding of information provided and agrees with plan of care (POC), all questions answered.  1. Physical exam WNL

## 2017-12-22 NOTE — Patient Instructions (Signed)

## 2017-12-28 ENCOUNTER — Ambulatory Visit: Payer: No Typology Code available for payment source | Admitting: Family Medicine

## 2018-01-10 MED FILL — ATOMOXETINE HCL 60 MG CAPS: 60 | 30 days supply | Qty: 30 | Fill #1

## 2018-01-17 ENCOUNTER — Other Ambulatory Visit: Payer: Self-pay

## 2018-01-17 ENCOUNTER — Ambulatory Visit (INDEPENDENT_AMBULATORY_CARE_PROVIDER_SITE_OTHER): Payer: No Typology Code available for payment source | Admitting: Family Medicine

## 2018-01-17 ENCOUNTER — Encounter: Payer: Self-pay | Admitting: Family Medicine

## 2018-01-17 VITALS — BP 116/70 | HR 69 | Temp 97.8°F | Resp 14 | Ht 64.0 in | Wt 253.1 lb

## 2018-01-17 DIAGNOSIS — Z79899 Other long term (current) drug therapy: Secondary | ICD-10-CM

## 2018-01-17 DIAGNOSIS — F988 Other specified behavioral and emotional disorders with onset usually occurring in childhood and adolescence: Secondary | ICD-10-CM | POA: Diagnosis not present

## 2018-01-17 DIAGNOSIS — E249 Cushing's syndrome, unspecified: Secondary | ICD-10-CM | POA: Diagnosis not present

## 2018-01-17 NOTE — Progress Notes (Signed)
Patient ID: Paula King, female    DOB: [birth year], 37 y.o.   MRN: [id number]  Chief Complaint  Patient presents with  . Medication Management    Allergies Ciprofloxacin  Subjective:   Paula King is a 37 y.o. female who presents to [facility] today.  HPI Here for a visit because she would like to be tried on a stimulant medication for her ADD.  Has been dx with ADD for years and is previously been on Strattera.  Would like to switch to stimulant medication from Strattera because she does not believe that the [place] works well.  Patient reports that she has had trouble with focus and concentration for years. Has trouble paying attention at work.  Was diagnosed with ADD while she was in elementary school.  Was never placed on medication because her mother refused to allow her to be on this medication.  Reports that her mother did not believe in medication and so she was mainly managed with behavioral modifications.  She reports that she struggled throughout school.  She reports that she was placed on Strattera and did not see a big improvement in her focus or attentive symptoms.  She reports that she has trouble focusing and concentrating.  Trouble with paying attention to detail.  Reports that her mind wonders whether people are talking.  Feels like she gets the beginning and end of conversations and misses everything in the middle.  Reports that she has trouble organizing all of her tasks at home.  Reports that her daughter even put sticky notes everywhere throughout the house to remind her mother to complete tasks.  She reports that she does not feel down, depressed, or hopeless.  Exercises on a daily basis.  Is trying to watch her diet and lose weight.  Is being followed by Dr. Dorris Fetch for Cushing's syndrome.  Is currently on a beta-blocker for management of her heart rate due to this condition.  She reports that she was placed on the atenolol because with the Cushing's her  heart rate got up into the 160s.  She denies any chest pain or shortness of breath.  Family history is reviewed in chart.   Past Medical History:  Diagnosis Date  . Diverticulitis   . Hypertension     Past Surgical History:  Procedure Laterality Date  . CESAREAN SECTION    . CHOLECYSTECTOMY    . WISDOM TOOTH EXTRACTION      Family History  Problem Relation Age of Onset  . Hypertension Father   . Diabetes Father   . Anxiety disorder Mother   . Depression Mother   . Cancer Sister        throat      Social History   Socioeconomic History  . Marital status: Single    Spouse name: Not on file  . Number of children: Not on file  . Years of education: Not on file  . Highest education level: Not on file  Occupational History  . Not on file  Social Needs  . Financial resource strain: Not hard at all  . Food insecurity:    Worry: Never true    Inability: Never true  . Transportation needs:    Medical: No    Non-medical: No  Tobacco Use  . Smoking status: Never Smoker  . Smokeless tobacco: Never Used  Substance and Sexual Activity  . Alcohol use: No  . Drug use: No  . Sexual activity: Yes    Birth  control/protection: None  Lifestyle  . Physical activity:    Days per week: 2 days    Minutes per session: 30 min  . Stress: Only a little  Relationships  . Social connections:    Talks on phone: More than three times a week    Gets together: More than three times a week    Attends religious service: Never    Active member of club or organization: Not on file    Attends meetings of clubs or organizations: Never    Relationship status: Never married  Other Topics Concern  . Not on file  Social History Narrative   Lives in [place], [place].   Single, not dating.    Has one child, age 15, 75.   Has family in [place], [place].   Enjoys swimming.   Enjoys time with family.    Eats all foods.   Wears seatbelt.     Review of Systems  Constitutional: Negative for  activity change, appetite change and fever.  Eyes: Negative for visual disturbance.  Respiratory: Negative for cough, chest tightness and shortness of breath.   Cardiovascular: Negative for chest pain, palpitations and leg swelling.  Gastrointestinal: Negative for abdominal pain, nausea and vomiting.  Genitourinary: Negative for dysuria, frequency and urgency.  Skin: Negative for rash.  Neurological: Negative for dizziness, syncope and light-headedness.  Hematological: Negative for adenopathy.  Psychiatric/Behavioral: Positive for decreased concentration. Negative for agitation, confusion, dysphoric mood, hallucinations, self-injury, sleep disturbance and suicidal ideas. The patient is not nervous/anxious and is not hyperactive.      Objective:   BP 116/70 (BP Location: Left Arm, Patient Position: Sitting, Cuff Size: Large)   Pulse 69   Temp 97.8 F (36.6 C) (Temporal)   Resp 14   Ht 5\' 4"  (1.626 m)   Wt 253 lb 1.9 oz (114.8 kg)   SpO2 98%   BMI 43.45 kg/m   Physical Exam  Constitutional: She appears well-developed and well-nourished.  HENT:  Head: Normocephalic and atraumatic.  Cardiovascular: Normal rate, regular rhythm and normal heart sounds.  Pulmonary/Chest: Effort normal and breath sounds normal.  Abdominal: Soft. Bowel sounds are normal.  Skin: Skin is warm and dry.  Psychiatric: She has a normal mood and affect. Her behavior is normal. Judgment and thought content normal.  Vitals reviewed.   Depression screen [facility] 2/9 01/17/2018 11/18/2017 07/22/2017 07/19/2017 05/24/2017  Decreased Interest 0 1 0 0 3  Down, Depressed, Hopeless 0 0 1 0 2  PHQ - 2 Score 0 1 1 0 5  Altered sleeping 1 1 - - 3  Tired, decreased energy 0 1 - - 3  Change in appetite 0 0 - - 3  Feeling bad or failure about yourself  0 0 - - 2  Trouble concentrating 2 0 - - 3  Moving slowly or fidgety/restless 0 0 - - 0  Suicidal thoughts 0 0 - - 0  PHQ-9 Score 3 3 - - 19  Difficult doing work/chores  Somewhat difficult Somewhat difficult - - -    Assessment and Plan   37 year old white female with diagnosed history of ADD, previously on Strattera.  Did not feel she received benefit from this medication.  Request trial of stimulant medication.  I did discuss with patient the risk versus benefits of stimulant medication.  She does have a current history of Cushing syndrome is being followed by endocrine.  She is currently on a beta-blocker due to palpitations and tachycardia.  I discussed with patient that  I believe that she would need an evaluation by cardiology stating that she is cleared to be on this medication before it could be prescribed.  She is not interested in trying the Strattera again.  She does not wish to see a neurologist for management of her ADD.  She is agreeable to see cardiology and get clearance to be placed on stimulant medication.  Today we did review pharmacologic and lifestyle modifications with regards to dealing with adult ADD.  She understands the risk versus benefits of medication.  We did discuss if cleared by cardiology we will try Vyvanse 30 mg p.o. daily.  Once we have clearance from cardiology she can contact our office and I will call in this prescription.  She will follow-up within 2 to 4 weeks after initiating this medication for recheck of blood pressure, heart rate, and medication monitoring.  She will be transferring her care to Dr. Buelah Manis at [facility] family medicine.  She will then follow-up with Dr. Payton Mccallum for her future medication refills.   1. Attention deficit disorder (ADD) without hyperactivity 2. High risk medication use 3. Cushing's syndrome ([place])  - Ambulatory referral to Cardiology  Return in about 1 month (around 02/14/2018) for Follow-up. Caren Macadam, MD 01/17/2018

## 2018-01-20 ENCOUNTER — Ambulatory Visit: Payer: No Typology Code available for payment source | Admitting: Family Medicine

## 2018-01-26 MED FILL — HYDROCODON-APAP 5-325: 5-325 | 3 days supply | Qty: 10 | Fill #0

## 2018-02-08 ENCOUNTER — Ambulatory Visit: Payer: No Typology Code available for payment source | Admitting: Family Medicine

## 2018-02-19 ENCOUNTER — Ambulatory Visit (INDEPENDENT_AMBULATORY_CARE_PROVIDER_SITE_OTHER): Payer: Self-pay | Admitting: Family Medicine

## 2018-02-19 VITALS — BP 118/78 | HR 74 | Temp 98.6°F

## 2018-02-19 DIAGNOSIS — H6503 Acute serous otitis media, bilateral: Secondary | ICD-10-CM

## 2018-02-19 MED ORDER — AMOXICILLIN-POT CLAVULANATE 875-125 MG PO TABS: 1.0000 | ORAL_TABLET | Freq: Two times a day (BID) | ORAL | 0 refills | Status: DC

## 2018-02-19 MED ORDER — ONDANSETRON 4 MG PO TBDP: 4.0000 mg | ORAL_TABLET | Freq: Three times a day (TID) | ORAL | 0 refills | Status: DC | PRN

## 2018-02-20 NOTE — Patient Instructions (Signed)

## 2018-02-20 NOTE — Progress Notes (Signed)
Paula King is a 38 y.o. female who presents today with concerns of bilateral ear pain accompanied with nausea and dizziness for the past 72 hours. Patient has a long complicated history regarding ENT concerns and is under the care of a specialist at this time for frequent pain and infections that cause frequent headaches. She reports she is currently not a surgical candidate for this condition. She is also under the care of endocrinology and cardiology. She present to today alert and a good historian.  Review of Systems  Constitutional: Negative for chills, fever and malaise/fatigue.  HENT: Positive for ear pain and sinus pain. Negative for congestion, ear discharge and sore throat.   Eyes: Negative.   Respiratory: Negative for cough, sputum production and shortness of breath.   Cardiovascular: Negative.  Negative for chest pain.  Gastrointestinal: Positive for nausea. Negative for abdominal pain, diarrhea and vomiting.  Genitourinary: Negative for dysuria, frequency, hematuria and urgency.  Musculoskeletal: Negative for myalgias.  Skin: Negative.   Neurological: Positive for dizziness. Negative for headaches.  Endo/Heme/Allergies: Negative.   Psychiatric/Behavioral: Negative.     O: Vitals:   02/19/18 1222  BP: 118/78  Pulse: 74  Temp: 98.6 F (37 C)    Physical Exam  Constitutional: She is oriented to person, place, and time. Vital signs are normal. She appears well-developed and well-nourished. She is active.  Non-toxic appearance. She does not have a sickly appearance.  HENT:  Head: Normocephalic.  Right Ear: Hearing, external ear and ear canal normal. There is tenderness. Tympanic membrane is bulging. A middle ear effusion is present.  Left Ear: Hearing, external ear and ear canal normal. There is tenderness. Tympanic membrane is bulging. A middle ear effusion is present.  Nose: Nose normal.  Mouth/Throat: Uvula is midline and oropharynx is clear and moist.  Neck: Normal  range of motion. Neck supple.  Cardiovascular: Normal rate, regular rhythm, normal heart sounds and normal pulses.  Pulmonary/Chest: Effort normal and breath sounds normal.  Abdominal: Soft. Bowel sounds are normal.  Musculoskeletal: Normal range of motion.  Lymphadenopathy:       Head (right side): No submental and no submandibular adenopathy present.       Head (left side): No submental and no submandibular adenopathy present.    She has no cervical adenopathy.  Neurological: She is alert and oriented to person, place, and time.  Psychiatric: She has a normal mood and affect.  Vitals reviewed.  A: 1. Non-recurrent acute serous otitis media of both ears   2. Nausea   3. Dizziness     P: Discussed exam findings, diagnosis etiology and medication use and indications reviewed with patient. Follow- Up and discharge instructions provided. No emergent/urgent issues found on exam.  Patient verbalized understanding of information provided and agrees with plan of care (POC), all questions answered.  Advised to seek care with PCP of ENT if symptoms are unresolved in 72 hours after starting medication.  1. Non-recurrent acute serous otitis media of both ears - amoxicillin-clavulanate (AUGMENTIN) 875-125 MG tablet; Take 1 tablet by mouth 2 (two) times daily.  2. Nausea - ondansetron (ZOFRAN ODT) 4 MG disintegrating tablet; Take 1 tablet (4 mg total) by mouth every 8 (eight) hours as needed for nausea or vomiting.  3. Dizziness - ondansetron (ZOFRAN ODT) 4 MG disintegrating tablet; Take 1 tablet (4 mg total) by mouth every 8 (eight) hours as needed for nausea or vomiting.

## 2018-02-22 ENCOUNTER — Ambulatory Visit: Payer: No Typology Code available for payment source | Admitting: Cardiovascular Disease

## 2018-02-22 ENCOUNTER — Encounter: Payer: Self-pay | Admitting: Cardiovascular Disease

## 2018-02-22 ENCOUNTER — Encounter

## 2018-02-22 VITALS — BP 120/87 | HR 79 | Ht 64.0 in | Wt 259.0 lb

## 2018-02-22 DIAGNOSIS — R002 Palpitations: Secondary | ICD-10-CM

## 2018-02-22 DIAGNOSIS — R000 Tachycardia, unspecified: Secondary | ICD-10-CM | POA: Diagnosis not present

## 2018-02-22 DIAGNOSIS — Z136 Encounter for screening for cardiovascular disorders: Secondary | ICD-10-CM

## 2018-02-22 DIAGNOSIS — F988 Other specified behavioral and emotional disorders with onset usually occurring in childhood and adolescence: Secondary | ICD-10-CM

## 2018-02-22 NOTE — Progress Notes (Addendum)
CARDIOLOGY CONSULT NOTE  Patient ID: Paula King MRN: [id number] DOB/AGE: February 06, 1981 38 y.o.  Admit date: (Not on file) Primary Physician: Paula Rossetti, MD Referring Physician: Caren Macadam, MD  Reason for Consultation: Tachycardia  HPI: Paula King is a 37 y.o. female who is being seen today for the evaluation of tachycardia at the request of Paula Macadam, MD.   Past medical history includes Cushing's syndrome and ADD.  She is on atenolol for tachycardia with heart rates in the 160 bpm range.  Her PCP would like to start her on Vyvanse for ADD but requests a cardiac evaluation beforehand.  ECG performed in the office today which I ordered and personally interpreted demonstrates normal sinus rhythm with nonspecific T-wave abnormalities and no arrhythmias.  I personally reviewed an ECG performed on 02/03/2015 which showed sinus tachycardia, 106 bpm.  She told me that she was evaluated in the ED at [facility] prior to this and had a heart rate in the 130 range but it has been as high as 160 bpm.  She has been maintained on atenolol for the past 2 to 3 years and feels much better with normal heart rates and blood pressure.  She had been on Strattera for years but it did not help alleviate her symptoms of ADD.  She currently denies chest pain, palpitations, leg swelling, dizziness, shortness of breath, and syncope.  She swims and does water aerobics about 3-4 times per week.  Family history: Her father had a pericardial effusion some years ago and also has diabetes.  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   Allergies  Allergen Reactions  . Ciprofloxacin     IV Only-Patient started sweating and becoming clammy when was infused with IV cipro on 08/09/14. Can tolerate PO Ciprofloxacin     Current Outpatient  Medications  Medication Sig Dispense Refill  . amoxicillin-clavulanate (AUGMENTIN) 875-125 MG tablet Take 1 tablet by mouth 2 (two) times daily. 20 tablet 0  . atenolol (TENORMIN) 25 MG tablet Take 1 tablet (25 mg total) by mouth daily. 90 tablet 3  . cetirizine (ZYRTEC) 10 MG tablet Take 1 tablet (10 mg total) by mouth daily. 90 tablet 1  . cholecalciferol (VITAMIN D) 1000 units tablet     . Cyanocobalamin (B-12) 1000 MCG TABS     . ondansetron (ZOFRAN ODT) 4 MG disintegrating tablet Take 1 tablet (4 mg total) by mouth every 8 (eight) hours as needed for nausea or vomiting. 10 tablet 0  . ranitidine (ZANTAC) 75 MG tablet Take 1 tablet (75 mg total) by mouth daily as needed for heartburn. 90 tablet 1   No current facility-administered medications for this visit.     Past Medical History:  Diagnosis Date  . Diverticulitis   . Hypertension     Past Surgical History:  Procedure Laterality Date  . CESAREAN SECTION    . CHOLECYSTECTOMY    . WISDOM TOOTH EXTRACTION      Social History   Socioeconomic History  . Marital status: Single    Spouse name: Not on file  . Number of children: Not on file  . Years of education: Not on file  . Highest education level: Not on file  Occupational History  . Not on file  Social Needs  . Financial resource strain: Not hard  at all  . Food insecurity:    Worry: Never true    Inability: Never true  . Transportation needs:    Medical: No    Non-medical: No  Tobacco Use  . Smoking status: Never Smoker  . Smokeless tobacco: Never Used  Substance and Sexual Activity  . Alcohol use: No  . Drug use: No  . Sexual activity: Yes    Birth control/protection: None  Lifestyle  . Physical activity:    Days per week: 2 days    Minutes per session: 30 min  . Stress: Only a little  Relationships  . Social connections:    Talks on phone: More than three times a week    Gets together: More than three times a week    Attends religious service: Never     Active member of club or organization: Not on file    Attends meetings of clubs or organizations: Never    Relationship status: Never married  . Intimate partner violence:    Fear of current or ex partner: No    Emotionally abused: No    Physically abused: No    Forced sexual activity: No  Other Topics Concern  . Not on file  Social History Narrative   Lives in [place], [place].   Single, not dating.    Has one child, age 29, 31.   Has family in [place], [place].   Enjoys swimming.   Enjoys time with family.    Eats all foods.   Wears seatbelt.       Current Meds  Medication Sig  . amoxicillin-clavulanate (AUGMENTIN) 875-125 MG tablet Take 1 tablet by mouth 2 (two) times daily.  Marland Kitchen atenolol (TENORMIN) 25 MG tablet Take 1 tablet (25 mg total) by mouth daily.  . cetirizine (ZYRTEC) 10 MG tablet Take 1 tablet (10 mg total) by mouth daily.  . cholecalciferol (VITAMIN D) 1000 units tablet   . Cyanocobalamin (B-12) 1000 MCG TABS   . ondansetron (ZOFRAN ODT) 4 MG disintegrating tablet Take 1 tablet (4 mg total) by mouth every 8 (eight) hours as needed for nausea or vomiting.  . ranitidine (ZANTAC) 75 MG tablet Take 1 tablet (75 mg total) by mouth daily as needed for heartburn.      Review of systems complete and found to be negative unless listed above in HPI    Physical exam Height 5\' 4"  (1.626 m), weight 259 lb (117.5 kg). General: NAD Neck: No JVD, no thyromegaly or thyroid nodule.  Lungs: Clear to auscultation bilaterally with normal respiratory effort. CV: Nondisplaced PMI. Regular rate and rhythm, normal S1/S2, no S3/S4, no murmur.  No peripheral edema.  No carotid bruit.    Abdomen: Soft, nontender, no distention.  Skin: Intact without lesions or rashes.  Neurologic: Alert and oriented x 3.  Psych: Normal affect. Extremities: No clubbing or cyanosis.  HEENT: Normal.   ECG: Most recent ECG reviewed.   Labs: Lab Results  Component Value Date/Time   K 4.5  04/18/2017 10:41 AM   BUN 11 04/18/2017 10:41 AM   CREATININE 1.11 (H) 04/18/2017 10:41 AM   ALT 30 (H) 04/18/2017 10:41 AM   TSH 1.83 04/18/2017 10:41 AM   HGB 14.2 11/18/2017 02:33 PM     Lipids: Lab Results  Component Value Date/Time   LDLCALC 75 04/18/2017 10:41 AM   CHOL 141 04/18/2017 10:41 AM   TRIG 115 04/18/2017 10:41 AM   HDL 46 (L) 04/18/2017 10:41 AM  ASSESSMENT AND PLAN:   1. Tachycardia and palpitations: Symptoms are well controlled on atenolol.  ECG from August 2016 as reviewed above demonstrative of only sinus tachycardia.  There is a small risk of tachycardia and palpitations with Vyvanse and more uncommonly, there have been more serious cardiovascular events including sudden death in patients with pre-existing structural heart disease.  There is no family history of premature coronary artery disease or sudden cardiac death.   She has never had a structural heart evaluation.  Her father did have a pericardial effusion. I will order a 2-D echocardiogram with Doppler to evaluate cardiac structure, function, and regional wall motion. Assuming the echocardiogram is unremarkable, I think it would be fine to proceed with a trial of Vyvanse.  2. ADD: She failed Strattera and is being considered for Vyvanse.   Disposition: Follow up in 4 months  Signed: Kate Sable, M.D., F.A.C.C.  02/22/2018, 8:39 AM

## 2018-02-22 NOTE — Patient Instructions (Signed)
Medication Instructions:  Your physician recommends that you continue on your current medications as directed. Please refer to the Current Medication list given to you today.  Labwork: none  Testing/Procedures: Your physician has requested that you have an echocardiogram. Echocardiography is a painless test that uses sound waves to create images of your heart. It provides your doctor with information about the size and shape of your heart and how well your heart's chambers and valves are working. This procedure takes approximately one hour. There are no restrictions for this procedure.  Follow-Up: Your physician recommends that you schedule a follow-up appointment in: 4 months  Any Other Special Instructions Will Be Listed Below (If Applicable).  If you need a refill on your cardiac medications before your next appointment, please call your pharmacy. 

## 2018-03-01 ENCOUNTER — Ambulatory Visit (HOSPITAL_COMMUNITY)
Admission: RE | Admit: 2018-03-01 | Discharge: 2018-03-01 | Disposition: A | Discharge status: Home / Self Care | Payer: No Typology Code available for payment source | Source: Ambulatory Visit | Attending: Cardiovascular Disease | Admitting: Cardiovascular Disease

## 2018-03-01 DIAGNOSIS — I10 Essential (primary) hypertension: Secondary | ICD-10-CM | POA: Diagnosis not present

## 2018-03-01 DIAGNOSIS — R000 Tachycardia, unspecified: Secondary | ICD-10-CM | POA: Diagnosis present

## 2018-03-01 DIAGNOSIS — Z6841 Body Mass Index (BMI) 40.0 and over, adult: Secondary | ICD-10-CM | POA: Diagnosis not present

## 2018-03-01 NOTE — Progress Notes (Signed)
*  PRELIMINARY RESULTS* Echocardiogram 2D Echocardiogram has been performed.  Paula King 03/01/2018, 9:25 AM

## 2018-03-03 ENCOUNTER — Encounter: Payer: Self-pay | Admitting: Family Medicine

## 2018-03-07 MED FILL — ATENOLOL 25 MG TABLET: 25 | 90 days supply | Qty: 90 | Fill #2

## 2018-03-08 MED ORDER — ATENOLOL 25 MG PO TABS: 25.0000 mg | ORAL_TABLET | Freq: Every day | ORAL | 3 refills | Status: DC

## 2018-03-08 MED ORDER — LISDEXAMFETAMINE DIMESYLATE 30 MG PO CAPS: 30.0000 mg | ORAL_CAPSULE | Freq: Every day | ORAL | 0 refills | Status: DC

## 2018-03-17 ENCOUNTER — Encounter: Payer: Self-pay | Admitting: Women's Health

## 2018-03-17 ENCOUNTER — Ambulatory Visit: Payer: No Typology Code available for payment source | Admitting: Women's Health

## 2018-03-17 VITALS — BP 111/75 | HR 65 | Ht 64.0 in | Wt 257.8 lb

## 2018-03-17 DIAGNOSIS — Z3490 Encounter for supervision of normal pregnancy, unspecified, unspecified trimester: Secondary | ICD-10-CM | POA: Insufficient documentation

## 2018-03-17 DIAGNOSIS — Z3201 Encounter for pregnancy test, result positive: Secondary | ICD-10-CM

## 2018-03-17 DIAGNOSIS — R110 Nausea: Secondary | ICD-10-CM

## 2018-03-17 DIAGNOSIS — Z98891 History of uterine scar from previous surgery: Secondary | ICD-10-CM | POA: Diagnosis not present

## 2018-03-17 DIAGNOSIS — R000 Tachycardia, unspecified: Secondary | ICD-10-CM

## 2018-03-17 DIAGNOSIS — O10919 Unspecified pre-existing hypertension complicating pregnancy, unspecified trimester: Secondary | ICD-10-CM

## 2018-03-17 DIAGNOSIS — Z3A01 Less than 8 weeks gestation of pregnancy: Secondary | ICD-10-CM

## 2018-03-17 DIAGNOSIS — Z8759 Personal history of other complications of pregnancy, childbirth and the puerperium: Secondary | ICD-10-CM

## 2018-03-17 DIAGNOSIS — Z3491 Encounter for supervision of normal pregnancy, unspecified, first trimester: Secondary | ICD-10-CM

## 2018-03-17 LAB — POCT URINE PREGNANCY: Preg Test, Ur: POSITIVE — AB

## 2018-03-17 NOTE — Patient Instructions (Addendum)
Paula King, I greatly value your feedback.  If you receive a survey following your visit with Paula King today, we appreciate you taking the time to fill it out.  Thanks, Knute Neu, CNM, WHNP-BC  Dating ultrasound Friday 10/4 @ 8:30am at [facility], full bladder   Nausea & Vomiting  Have saltine crackers or pretzels by your bed and eat a few bites before you raise your head out of bed in the morning  Eat small frequent meals throughout the day instead of large meals  Drink plenty of fluids throughout the day to stay hydrated, just don't drink a lot of fluids with your meals.  This can make your stomach fill up faster making you feel sick  Do not brush your teeth right after you eat  Products with real ginger are good for nausea, like ginger ale and ginger hard candy Make sure it says made with real ginger!  Sucking on sour candy like lemon heads is also good for nausea  If your prenatal vitamins make you nauseated, take them at night so you will sleep through the nausea  Sea Bands  If you feel like you need medicine for the nausea & vomiting please let Paula King know  If you are unable to keep any fluids or food down please let Paula King know   Constipation  Drink plenty of fluid, preferably water, throughout the day  Eat foods high in fiber such as fruits, vegetables, and grains  Exercise, such as walking, is a good way to keep your bowels regular  Drink warm fluids, especially warm prune juice, or decaf coffee  Eat a 1/2 cup of real oatmeal (not instant), 1/2 cup applesauce, and 1/2-1 cup warm prune juice every day  If needed, you may take Colace (docusate sodium) stool softener once or twice a day to help keep the stool soft. If you are pregnant, wait until you are out of your first trimester (12-14 weeks of pregnancy)  If you still are having problems with constipation, you may take Miralax once daily as needed to help keep your bowels regular.  If you are pregnant, wait until you are  out of your first trimester (12-14 weeks of pregnancy)   First Trimester of Pregnancy The first trimester of pregnancy is from week 1 until the end of week 12 (months 1 through 3). A week after a sperm fertilizes an egg, the egg will implant on the wall of the uterus. This embryo will begin to develop into a baby. Genes from you and your partner are forming the baby. The female genes determine whether the baby is a boy or a girl. At 6-8 weeks, the eyes and face are formed, and the heartbeat can be seen on ultrasound. At the end of 12 weeks, all the baby's organs are formed.  Now that you are pregnant, you will want to do everything you can to have a healthy baby. Two of the most important things are to get good prenatal care and to follow your health care provider's instructions. Prenatal care is all the medical care you receive before the baby's birth. This care will help prevent, find, and treat any problems during the pregnancy and childbirth. BODY CHANGES Your body goes through many changes during pregnancy. The changes vary from woman to woman.   You may gain or lose a couple of pounds at first.  You may feel sick to your stomach (nauseous) and throw up (vomit). If the vomiting is uncontrollable, call your health care  provider.  You may tire easily.  You may develop headaches that can be relieved by medicines approved by your health care provider.  You may urinate more often. Painful urination may mean you have a bladder infection.  You may develop heartburn as a result of your pregnancy.  You may develop constipation because certain hormones are causing the muscles that push waste through your intestines to slow down.  You may develop hemorrhoids or swollen, bulging veins (varicose veins).  Your breasts may begin to grow larger and become tender. Your nipples may stick out more, and the tissue that surrounds them (areola) may become darker.  Your gums may bleed and may be sensitive to  brushing and flossing.  Dark spots or blotches (chloasma, mask of pregnancy) may develop on your face. This will likely fade after the baby is born.  Your menstrual periods will stop.  You may have a loss of appetite.  You may develop cravings for certain kinds of food.  You may have changes in your emotions from day to day, such as being excited to be pregnant or being concerned that something may go wrong with the pregnancy and baby.  You may have more vivid and strange dreams.  You may have changes in your hair. These can include thickening of your hair, rapid growth, and changes in texture. Some women also have hair loss during or after pregnancy, or hair that feels dry or thin. Your hair will most likely return to normal after your baby is born. WHAT TO EXPECT AT YOUR PRENATAL VISITS During a routine prenatal visit:  You will be weighed to make sure you and the baby are growing normally.  Your blood pressure will be taken.  Your abdomen will be measured to track your baby's growth.  The fetal heartbeat will be listened to starting around week 10 or 12 of your pregnancy.  Test results from any previous visits will be discussed. Your health care provider may ask you:  How you are feeling.  If you are feeling the baby move.  If you have had any abnormal symptoms, such as leaking fluid, bleeding, severe headaches, or abdominal cramping.  If you have any questions. Other tests that may be performed during your first trimester include:  Blood tests to find your blood type and to check for the presence of any previous infections. They will also be used to check for low iron levels (anemia) and Rh antibodies. Later in the pregnancy, blood tests for diabetes will be done along with other tests if problems develop.  Urine tests to check for infections, diabetes, or protein in the urine.  An ultrasound to confirm the proper growth and development of the baby.  An amniocentesis  to check for possible genetic problems.  Fetal screens for spina bifida and Down syndrome.  You may need other tests to make sure you and the baby are doing well. HOME CARE INSTRUCTIONS  Medicines  Follow your health care provider's instructions regarding medicine use. Specific medicines may be either safe or unsafe to take during pregnancy.  Take your prenatal vitamins as directed.  If you develop constipation, try taking a stool softener if your health care provider approves. Diet  Eat regular, well-balanced meals. Choose a variety of foods, such as meat or vegetable-based protein, fish, milk and low-fat dairy products, vegetables, fruits, and whole grain breads and cereals. Your health care provider will help you determine the amount of weight gain that is right for you.  Avoid raw meat and uncooked cheese. These carry germs that can cause birth defects in the baby.  Eating four or five small meals rather than three large meals a day may help relieve nausea and vomiting. If you start to feel nauseous, eating a few soda crackers can be helpful. Drinking liquids between meals instead of during meals also seems to help nausea and vomiting.  If you develop constipation, eat more high-fiber foods, such as fresh vegetables or fruit and whole grains. Drink enough fluids to keep your urine clear or pale yellow. Activity and Exercise  Exercise only as directed by your health care provider. Exercising will help you:  Control your weight.  Stay in shape.  Be prepared for labor and delivery.  Experiencing pain or cramping in the lower abdomen or low back is a good sign that you should stop exercising. Check with your health care provider before continuing normal exercises.  Try to avoid standing for long periods of time. Move your legs often if you must stand in one place for a long time.  Avoid heavy lifting.  Wear low-heeled shoes, and practice good posture.  You may continue to have  sex unless your health care provider directs you otherwise. Relief of Pain or Discomfort  Wear a good support bra for breast tenderness.   Take warm sitz baths to soothe any pain or discomfort caused by hemorrhoids. Use hemorrhoid cream if your health care provider approves.   Rest with your legs elevated if you have leg cramps or low back pain.  If you develop varicose veins in your legs, wear support hose. Elevate your feet for 15 minutes, 3-4 times a day. Limit salt in your diet. Prenatal Care  Schedule your prenatal visits by the twelfth week of pregnancy. They are usually scheduled monthly at first, then more often in the last 2 months before delivery.  Write down your questions. Take them to your prenatal visits.  Keep all your prenatal visits as directed by your health care provider. Safety  Wear your seat belt at all times when driving.  Make a list of emergency phone numbers, including numbers for family, friends, the hospital, and police and fire departments. General Tips  Ask your health care provider for a referral to a local prenatal education class. Begin classes no later than at the beginning of month 6 of your pregnancy.  Ask for help if you have counseling or nutritional needs during pregnancy. Your health care provider can offer advice or refer you to specialists for help with various needs.  Do not use hot tubs, steam rooms, or saunas.  Do not douche or use tampons or scented sanitary pads.  Do not cross your legs for long periods of time.  Avoid cat litter boxes and soil used by cats. These carry germs that can cause birth defects in the baby and possibly loss of the fetus by miscarriage or stillbirth.  Avoid all smoking, herbs, alcohol, and medicines not prescribed by your health care provider. Chemicals in these affect the formation and growth of the baby.  Schedule a dentist appointment. At home, brush your teeth with a soft toothbrush and be gentle when  you floss. SEEK MEDICAL CARE IF:   You have dizziness.  You have mild pelvic cramps, pelvic pressure, or nagging pain in the abdominal area.  You have persistent nausea, vomiting, or diarrhea.  You have a bad smelling vaginal discharge.  You have pain with urination.  You notice increased swelling in  your face, hands, legs, or ankles. SEEK IMMEDIATE MEDICAL CARE IF:   You have a fever.  You are leaking fluid from your vagina.  You have spotting or bleeding from your vagina.  You have severe abdominal cramping or pain.  You have rapid weight gain or loss.  You vomit blood or material that looks like coffee grounds.  You are exposed to Paula King measles and have never had them.  You are exposed to fifth disease or chickenpox.  You develop a severe headache.  You have shortness of breath.  You have any kind of trauma, such as from a fall or a car accident. Document Released: 06/08/2001 Document Revised: 10/29/2013 Document Reviewed: 04/24/2013 [facility] Patient Information 2015 [place], [place]. This information is not intended to replace advice given to you by your health care provider. Make sure you discuss any questions you have with your health care provider.

## 2018-03-17 NOTE — Progress Notes (Signed)
   GYN VISIT Patient name: Paula King MRN [id number]  Date of birth: Mar 17, 1981 Chief Complaint:   +home pregnancy test  History of Present Illness:   Paula King is a 37 y.o. [id number] Caucasian female [redacted]w[redacted]d by certain LMP of 6/60, being seen today for +HPT x 13.  Was not trying, but was not preventing. Taking pnv. Some nausea, no vomiting, declines need for meds. Has CHTN and tachycardia, on atenolol- has tried 'everything else' and nothing works but atenolol. Previous c/s d/t severe pre-e @ 35wks at [facility], will get records.   Patient's last menstrual period was 02/06/2018. Review of Systems:   Pertinent items are noted in HPI Denies fever/chills, dizziness, headaches, visual disturbances, fatigue, shortness of breath, chest pain, abdominal pain, vomiting, abnormal vaginal discharge/itching/odor/irritation, problems with periods, bowel movements, urination, or intercourse unless otherwise stated above.  Pertinent History Reviewed:  Reviewed past medical,surgical, social, obstetrical and family history.  Reviewed problem list, medications and allergies. Physical Assessment:   Vitals:   03/17/18 0838  BP: 111/75  Pulse: 65  Weight: 257 lb 12.8 oz (116.9 kg)  Height: 5\' 4"  (1.626 m)  Body mass index is 44.25 kg/m.       Physical Examination:   General appearance: alert, well appearing, and in no distress  Mental status: alert, oriented to person, place, and time  Skin: warm & dry   Cardiovascular: normal heart rate noted  Respiratory: normal respiratory effort, no distress  Abdomen: soft, non-tender   Pelvic: examination not indicated  Extremities: no edema   No results found for this or any previous visit (from the past 24 hour(s)).  Assessment & Plan:  1) [redacted]w[redacted]d pregnant> by LMP, dating u/s 10/4 at 0830 @ [facility], then new ob here in 4wks, continue pnv. Discussed potential for fetal hypoglycemia, bradycardia and low birth weight w/ atenolol- she has tried everything else and nothing  works- to stay on atenolol. Will get records from previous c/s from [facility].   Meds: No orders of the defined types were placed in this encounter.   Orders Placed This Encounter  Procedures  . US OB Comp Less 14 Wks  . POCT urine pregnancy    Return for 4wks for , intake w/ tish & new ob, please get c/s records from [facility].  [place], [facility] 03/17/2018 9:44 AM

## 2018-03-22 ENCOUNTER — Other Ambulatory Visit: Payer: Self-pay

## 2018-03-22 ENCOUNTER — Telehealth: Payer: Self-pay | Admitting: *Deleted

## 2018-03-22 ENCOUNTER — Other Ambulatory Visit: Payer: Self-pay | Admitting: Women's Health

## 2018-03-22 DIAGNOSIS — O209 Hemorrhage in early pregnancy, unspecified: Secondary | ICD-10-CM

## 2018-03-22 DIAGNOSIS — O208 Other hemorrhage in early pregnancy: Principal | ICD-10-CM

## 2018-03-22 NOTE — Telephone Encounter (Signed)
Patient states she started bleeding heavily last night and passing several clots along with cramping.  She is pretty certain she is experiencing a miscarriage.  Spoke to [place] and patient needs HCG and U/S ASAP.  States her blood type is A+.  Will come today for HCG and U/S tomorrow.  Pt verbalized understanding with no further questions.

## 2018-03-23 ENCOUNTER — Other Ambulatory Visit: Payer: Self-pay | Admitting: Women's Health

## 2018-03-23 ENCOUNTER — Encounter: Payer: Self-pay | Admitting: Advanced Practice Midwife

## 2018-03-23 ENCOUNTER — Ambulatory Visit (INDEPENDENT_AMBULATORY_CARE_PROVIDER_SITE_OTHER): Payer: No Typology Code available for payment source

## 2018-03-23 ENCOUNTER — Ambulatory Visit (INDEPENDENT_AMBULATORY_CARE_PROVIDER_SITE_OTHER): Payer: No Typology Code available for payment source | Admitting: Advanced Practice Midwife

## 2018-03-23 VITALS — BP 112/71 | HR 63 | Ht 64.0 in | Wt 258.0 lb

## 2018-03-23 DIAGNOSIS — O208 Other hemorrhage in early pregnancy: Secondary | ICD-10-CM | POA: Diagnosis not present

## 2018-03-23 DIAGNOSIS — O209 Hemorrhage in early pregnancy, unspecified: Secondary | ICD-10-CM

## 2018-03-23 DIAGNOSIS — O039 Complete or unspecified spontaneous abortion without complication: Secondary | ICD-10-CM | POA: Diagnosis not present

## 2018-03-23 LAB — BETA HCG QUANT (REF LAB): hCG Quant: 4673 m[IU]/mL

## 2018-03-23 LAB — SPECIMEN STATUS REPORT

## 2018-03-23 MED ORDER — MISOPROSTOL 200 MCG PO TABS: ORAL_TABLET | ORAL | 1 refills | Status: DC

## 2018-03-23 MED ORDER — KETOROLAC TROMETHAMINE 10 MG PO TABS: 10.0000 mg | ORAL_TABLET | Freq: Four times a day (QID) | ORAL | 0 refills | Status: DC | PRN

## 2018-03-23 MED FILL — miSOPROStol 200 MCG TABS: 200 | 3 days supply | Qty: 12 | Fill #0

## 2018-03-23 MED FILL — KETOROLAC 10 MG TABLET: 10 | 5 days supply | Qty: 20 | Fill #0

## 2018-03-23 NOTE — Progress Notes (Signed)
[facility] Visit  Patient name: Paula King MRN [id number]  Date of birth: March 09, 1981  CC & HPI:  Paula King is a 37 y.o. Caucasian female presenting today for F/U US Started bleeding heavily 2 nights ago, only spotting now.  [facility] [id number]  Pertinent History Reviewed:  Medical & Surgical Hx:   Past Medical History:  Diagnosis Date  . 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[place])   . Diverticulitis   . Hypertension   . Preeclampsia    Past Surgical History:  Procedure Laterality Date  . CESAREAN SECTION    . CHOLECYSTECTOMY    . WISDOM TOOTH EXTRACTION     Family History  Problem Relation Age of Onset  . Hypertension Father   . Diabetes Father   . Anxiety disorder Mother   . Depression Mother   . Cancer Sister        throat     Current Outpatient Medications:  .  atenolol (TENORMIN) 25 MG tablet, Take 1 tablet (25 mg total) by mouth daily., Disp: 90 tablet, Rfl: 3 .  cholecalciferol (VITAMIN D) 1000 units tablet, , Disp: , Rfl:  .  Cyanocobalamin (B-12) 1000 MCG TABS, , Disp: , Rfl:  .  Prenatal Vit-Fe Fumarate-FA (MULTIVITAMIN-PRENATAL) 27-0.8 MG TABS tablet, Take 1 tablet by mouth daily at 12 noon., Disp: , Rfl:  .  cetirizine (ZYRTEC) 10 MG tablet, Take 1 tablet (10 mg total) by mouth daily. (Patient not taking: Reported on 03/17/2018), Disp: 90 tablet, Rfl: 1 .  ondansetron (ZOFRAN ODT) 4 MG disintegrating tablet, Take 1 tablet (4 mg total) by mouth every 8 (eight) hours as needed for nausea or vomiting. (Patient not taking: Reported on 03/17/2018), Disp: 10 tablet, Rfl: 0 Social History: Reviewed -  reports that she has never smoked. She has never used smokeless tobacco.  Review of Systems:   Constitutional: Negative for fever and chills Eyes: Negative for visual disturbances Respiratory: Negative for shortness of breath, dyspnea Cardiovascular: Negative for chest pain or palpitations  Gastrointestinal: Negative for vomiting, diarrhea and constipation; no abdominal  pain Genitourinary: Negative for dysuria and urgency, vaginal irritation or itching Musculoskeletal: Negative for back pain, joint pain, myalgias  Neurological: Negative for dizziness and headaches    Objective Findings:    Physical Examination: Vitals:   03/23/18 1009  BP: 112/71  Pulse: 63   General appearance - well appearing, and in no distress Mental status - alert, oriented to person, place, and time Chest:  Normal respiratory effort Heart - normal rate and regular rhythm Abdomen:  Soft, nontender, Musculoskeletal:  Normal range of motion without pain Extremities:  No edema   [name] TA/TV:no IUP visualized,complex endometrium 18 mm,normal right ovary,left ovarian cyst with a septation 3.8 x 2.8 x 2.9 cm,no adnexal masses visualized,no free fluid,no pain during ultrasound  No results found for this or any previous visit (from the past 24 hour(s)).    Assessment & Plan:  A:   SAB  1.8cm thick endometrium P:  Discussed w/LHE.  Cytotec 800 mcg PO X 3 days  Bleeding precautions  HCG 10-14 days, follow to 0  A+ blood type   No follow-ups on file.  Joaquim Lai Cresenzo-Dishmon CNM 03/23/2018 10:30 AM

## 2018-03-23 NOTE — Progress Notes (Signed)
[name] TA/TV:no IUP visualized,complex endometrium 18 mm,normal right ovary,left ovarian cyst with a septation 3.8 x 2.8 x 2.9 cm,no adnexal masses visualized,no free fluid,no pain during ultrasound

## 2018-03-23 NOTE — Patient Instructions (Signed)
FACTS YOU SHOULD KNOW  WHAT IS AN EARLY PREGNANCY FAILURE? Once the egg is fertilized with the sperm and begins to develop, it attaches to the lining of the uterus. This early pregnancy tissue may not develop into an embryo (the beginning stage of a baby). Sometimes an embryo does develop but does not continue to grow. These problems can be seen on ultrasound.   MANAGEMNT OF EARLY PREGNANCY FAILURE: About 4 out of 100 (0.25%) women will have a pregnancy loss in her lifetime.  One in five pregnancies is found to be an early pregnancy failure.  There are 3 ways to care for an early pregnancy failure:    (1) Medicine, (2) Waiting for you to pass the pregnancy on your own. Sometimes, surgery is necessary.   The decision as to how to proceed after being diagnosed with and early pregnancy failure is an individual one.  The decision can be made only after appropriate counseling.  You need to weigh the pros and cons of the choices. Then you can make the choice that works for you. Medicine ([place]) . The complete procedure may take days to weeks, usually a few days . No Surgery . Bleeding may be heavy at times . There may be drug side effects . Patient has more control Waiting . You may choose to wait, in which case your own body may complete the passing of the abnormal early pregnancy on its own in about 2-4 weeks . Your bleeding may be heavy at times . There is a small possibility that you may need surgery if the bleeding is too much or not all of the pregnancy has passed.  SURGERY (D&C) . Procedure over in 1 day . Requires being put to sleep . Bleeding may be light . Possible problems during surgery, including injury to womb(uterus) . Care provider has more control  CYTOTEC MANAGEMENT Prostaglandins (cytotec) are the most widely used drug for this purpose. They cause the uterus to cramp and contract. You will place the medicine yourself inside your vagina in the privacy of your home. Empting  of the uterus should occur within 3 days but the process may continue for several weeks. The bleeding may seem heavy at times. POSSIBLE SIDE EFFECTS FROM CYTOTEC . Nausea   Vomiting . Diarrhea Fever . Chills  Hot Flashes Side effects  from the process of the early pregnancy failure include: . Cramping  Bleeding . Headaches  Dizziness RISKS: This is a low risk procedure. Less than 1 in 100 women has a complication. An incomplete passage of the early pregnancy may occur. Also, Hemorrhage (heavy bleeding) could happen.  Rarely the pregnancy will not be passed completely. Excessively heavy bleeding may occur.  Your doctor may need to perform surgery to empty the uterus (D&E). Afterwards: Everybody will feel differently after the early pregnancy completion. You may have soreness or cramps for a day or two. You may have soreness or cramps for day or two.  You may have light bleeding for up to 2 weeks. You may be as active as you feel like being. If you have any of the following problems you may call Maternity Admissions Unit at [phone]. . If you have pain that does not get better  with pain medication . Bleeding that soaks through 2 thick full-sized sanitary pads in an hour . Cramps that last longer than 2 days . Foul smelling discharge . Fever above 100.4 degrees F Even if you do not have any of these symptoms,  you should have a follow-up exam to make sure you are healing properly. This appointment will be made for you before you leave the hospital. Your next normal period will start again in 4-6 week after the loss. You can get pregnant soon after the loss, so use birth control right away. Finally: Make sure all your questions are answered before during and after any procedure. Follow up with medical care and family planning methods.

## 2018-03-31 ENCOUNTER — Other Ambulatory Visit: Payer: Self-pay | Admitting: Women's Health

## 2018-03-31 ENCOUNTER — Ambulatory Visit (HOSPITAL_COMMUNITY): Payer: No Typology Code available for payment source

## 2018-03-31 DIAGNOSIS — N83202 Unspecified ovarian cyst, left side: Secondary | ICD-10-CM

## 2018-04-03 ENCOUNTER — Telehealth: Payer: Self-pay | Admitting: *Deleted

## 2018-04-03 NOTE — Telephone Encounter (Signed)
Line busy

## 2018-04-04 ENCOUNTER — Encounter: Payer: Self-pay | Admitting: *Deleted

## 2018-04-04 ENCOUNTER — Telehealth: Payer: Self-pay | Admitting: *Deleted

## 2018-04-04 NOTE — Telephone Encounter (Signed)
Number busy. Will send another mychart message.

## 2018-04-13 ENCOUNTER — Encounter: Payer: No Typology Code available for payment source | Admitting: Advanced Practice Midwife

## 2018-04-13 ENCOUNTER — Ambulatory Visit: Payer: No Typology Code available for payment source | Admitting: *Deleted

## 2018-05-10 MED FILL — ANASTROZOLE 1 MG TABLET: 1 | 90 days supply | Qty: 90 | Fill #0

## 2018-05-17 MED FILL — ACETAMINOPHEN/COD #3 TABLET: 300-30 | 2 days supply | Qty: 16 | Fill #0

## 2018-05-17 MED FILL — AMOXICILLIN 500 MG CAPSULE: 500 | 7 days supply | Qty: 21 | Fill #0

## 2018-05-24 ENCOUNTER — Ambulatory Visit (INDEPENDENT_AMBULATORY_CARE_PROVIDER_SITE_OTHER): Payer: No Typology Code available for payment source | Admitting: Family Medicine

## 2018-05-24 ENCOUNTER — Other Ambulatory Visit: Payer: Self-pay

## 2018-05-24 ENCOUNTER — Encounter: Payer: Self-pay | Admitting: Family Medicine

## 2018-05-24 VITALS — BP 124/78 | HR 86 | Temp 98.2°F | Resp 14 | Ht 64.0 in | Wt 264.0 lb

## 2018-05-24 DIAGNOSIS — E049 Nontoxic goiter, unspecified: Secondary | ICD-10-CM

## 2018-05-24 DIAGNOSIS — F909 Attention-deficit hyperactivity disorder, unspecified type: Secondary | ICD-10-CM | POA: Diagnosis not present

## 2018-05-24 DIAGNOSIS — E249 Cushing's syndrome, unspecified: Secondary | ICD-10-CM

## 2018-05-24 DIAGNOSIS — Z13220 Encounter for screening for lipoid disorders: Secondary | ICD-10-CM

## 2018-05-24 MED ORDER — LISDEXAMFETAMINE DIMESYLATE 30 MG PO CAPS: 30.0000 mg | ORAL_CAPSULE | Freq: Every day | ORAL | 0 refills | Status: DC

## 2018-05-24 NOTE — Progress Notes (Signed)
Subjective:    Patient ID: Paula King, female    DOB: [birth year], 37 y.o.   MRN: [id number]  Patient presents for [facility] (is fasting)  Patient here to establish care.  Previous PCP Dr. Mannie Stabile last seen in July 2019.  At that visit she is being seen to discuss a stimulant medication for her ADD.  She had a been diagnosed with ADD in the past she was on Strattera but wanted to go back on a stimulant medication.   - Cant stay focused., she has difficulty concentrating at work  She is followed by endocrinology for Cushing syndrome she is on beta-blocker to control her heart rate Also helps control her blood pressure.  She was actually referred to cardiology for clearance before starting the medication secondary to her history of palpitations and tachycardia in the setting of her cushions.  The plan was to start her on Vyvanse.  She was evaluated by cardiology in August she had an echo performed which was normal she was cleared by cardiology to start the stimulant medication. She then found out she was pregnant so never started, she would like to try   Currently being followed [facility]  She does have a cyst on her ovary that they are monitoring on the left side she also suffered recent spontaneous abortion   No current hormones  She has had some depression, low mood, but it has been improving, she has good support system   Old endocrinology note she was seen last in June at that time she had incidental right adrenal adenoma.  She also has a goiter but no family history of thyroid cancer.  She did not have any nodules and currently this is being monitored she will have thyroid function test done yearly for now.    - Euthyroid  History of diverticulitis- curently on probiotics, diverticular disease,  Obesity- was on diet, has gained 20lbs admits to stress eating, not exercising     she had A1c performed by endocrinology  Vitamin D deficiency     Works [place] for [employer]     Has 30 year old    Review Of Systems:  GEN- + fatigue, fever, weight loss,weakness, recent illness HEENT- denies eye drainage, change in vision, nasal discharge, CVS- denies chest pain, palpitations RESP- denies SOB, cough, wheeze ABD- denies N/V, change in stools, abd pain GU- denies dysuria, hematuria, dribbling, incontinence MSK- denies joint pain, muscle aches, injury Neuro- denies headache, dizziness, syncope, seizure activity       Objective:    BP 124/78   Pulse 86   Temp 98.2 F (36.8 C) (Oral)   Resp 14   Ht 5\' 4"  (1.626 m)   Wt 264 lb (119.7 kg)   LMP 01/16/2018   SpO2 99%   BMI 45.32 kg/m  GEN- NAD, alert and oriented x3 HEENT- PERRL, EOMI, non injected sclera, pink conjunctiva, MMM, oropharynx clear Neck- Supple, + thyromegaly CVS- RRR, no murmur RESP-CTAB ABD-NABS,soft,NT,ND Psych- normal affect and mood  EXT- No edema, has lipoma Right shin  Pulses- Radial, DP- 2+        Assessment & Plan:      Problem List Items Addressed This Visit      Unprioritized   ADD (attention deficit disorder) - Primary    Plan to start vyvanse 30mg  once a day first and see how fatigue and concentration improve. I think this is also tied to her depressed symptoms and  she knows she is not functioning her best at work and then recently had miscarriage      Cushings syndrome [facility])    Per endocrinology She is not diabetic Euthyroid      Relevant Orders   CBC with Differential/Platelet (Completed)   Comprehensive metabolic panel (Completed)   Goiter    Will have repeat US in 1 year      Morbid obesity ([place])   Relevant Medications   lisdexamfetamine (VYVANSE) 30 MG capsule   Other Relevant Orders   Lipid panel (Completed)    Other Visit Diagnoses    Screening cholesterol level       Relevant Orders   Lipid panel (Completed)      Note: This dictation was prepared with Dragon dictation along with smaller phrase  technology. Any transcriptional errors that result from this process are unintentional.

## 2018-05-24 NOTE — Patient Instructions (Signed)
F/U 2 month

## 2018-05-25 LAB — LIPID PANEL
CHOL/HDL RATIO: 3.4 (calc) (ref <5.0)
CHOLESTEROL: 147 mg/dL (ref <200)
HDL: 43 mg/dL — ABNORMAL LOW (ref >50)
LDL CHOLESTEROL (CALC): 84 mg/dL
NON-HDL CHOLESTEROL (CALC): 104 mg/dL (ref <130)
Triglycerides: 105 mg/dL (ref <150)

## 2018-05-25 LAB — CBC WITH DIFFERENTIAL/PLATELET
BASOS ABS: 47 {cells}/uL (ref 0–200)
Basophils Relative: 0.6 %
EOS ABS: 117 {cells}/uL (ref 15–500)
Eosinophils Relative: 1.5 %
HCT: 42.5 % (ref 35.0–45.0)
HEMOGLOBIN: 14.6 g/dL (ref 11.7–15.5)
Lymphs Abs: 2746 cells/uL (ref 850–3900)
MCH: 31 pg (ref 27.0–33.0)
MCHC: 34.4 g/dL (ref 32.0–36.0)
MCV: 90.2 fL (ref 80.0–100.0)
MONOS PCT: 7.2 %
MPV: 9.3 fL (ref 7.5–12.5)
Neutro Abs: 4329 cells/uL (ref 1500–7800)
Neutrophils Relative %: 55.5 %
Platelets: 335 10*3/uL (ref 140–400)
RBC: 4.71 10*6/uL (ref 3.80–5.10)
RDW: 12.9 % (ref 11.0–15.0)
Total Lymphocyte: 35.2 %
WBC mixed population: 562 cells/uL (ref 200–950)
WBC: 7.8 10*3/uL (ref 3.8–10.8)

## 2018-05-25 LAB — COMPREHENSIVE METABOLIC PANEL
AG Ratio: 1.6 (calc) (ref 1.0–2.5)
ALBUMIN MSPROF: 4.2 g/dL (ref 3.6–5.1)
ALT: 23 U/L (ref 6–29)
AST: 23 U/L (ref 10–30)
Alkaline phosphatase (APISO): 49 U/L (ref 33–115)
BUN: 16 mg/dL (ref 7–25)
CO2: 24 mmol/L (ref 20–32)
CREATININE: 0.88 mg/dL (ref 0.50–1.10)
Calcium: 9.2 mg/dL (ref 8.6–10.2)
Chloride: 106 mmol/L (ref 98–110)
GLUCOSE: 78 mg/dL (ref 65–99)
Globulin: 2.7 g/dL (calc) (ref 1.9–3.7)
POTASSIUM: 4.1 mmol/L (ref 3.5–5.3)
SODIUM: 141 mmol/L (ref 135–146)
TOTAL PROTEIN: 6.9 g/dL (ref 6.1–8.1)
Total Bilirubin: 0.4 mg/dL (ref 0.2–1.2)

## 2018-05-26 ENCOUNTER — Encounter: Payer: Self-pay | Admitting: Family Medicine

## 2018-05-26 NOTE — Assessment & Plan Note (Signed)
Will have repeat US in 1 year

## 2018-05-26 NOTE — Assessment & Plan Note (Signed)
Per endocrinology She is not diabetic Euthyroid

## 2018-05-26 NOTE — Assessment & Plan Note (Signed)
Plan to start vyvanse 30mg  once a day first and see how fatigue and concentration improve. I think this is also tied to her depressed symptoms and she knows she is not functioning her best at work and then recently had miscarriage

## 2018-06-01 ENCOUNTER — Telehealth: Payer: Self-pay | Admitting: *Deleted

## 2018-06-01 NOTE — Telephone Encounter (Signed)
Received request from pharmacy for PA on Vyvanse  PA submitted.   Dx: F90.9- ADHD

## 2018-06-01 NOTE — Telephone Encounter (Signed)
Your information has been sent to [employer]. 

## 2018-06-05 MED FILL — VYVANSE 30 MG CAPSULE: 30 | 30 days supply | Qty: 30 | Fill #0

## 2018-06-07 NOTE — Telephone Encounter (Signed)
Received PA determination.   PA 1157 approved 06/02/2018- 06/02/2019.  Pharmacy made aware.

## 2018-06-19 MED FILL — ATENOLOL 25 MG TABLET: 25 | 90 days supply | Qty: 90 | Fill #3

## 2018-07-25 MED FILL — ATOMOXETINE HCL 60 MG CAPS: 60 | 30 days supply | Qty: 30 | Fill #2

## 2018-07-28 ENCOUNTER — Ambulatory Visit: Payer: No Typology Code available for payment source | Admitting: Family Medicine

## 2018-08-09 ENCOUNTER — Encounter: Payer: Self-pay | Admitting: Family Medicine

## 2018-08-09 ENCOUNTER — Ambulatory Visit (INDEPENDENT_AMBULATORY_CARE_PROVIDER_SITE_OTHER): Payer: No Typology Code available for payment source | Admitting: Family Medicine

## 2018-08-09 VITALS — BP 130/82 | HR 76 | Temp 98.5°F | Resp 18 | Ht 64.0 in | Wt 266.0 lb

## 2018-08-09 DIAGNOSIS — F909 Attention-deficit hyperactivity disorder, unspecified type: Secondary | ICD-10-CM

## 2018-08-09 MED ORDER — METHYLPHENIDATE HCL ER (OSM) 27 MG PO TBCR: 27.0000 mg | EXTENDED_RELEASE_TABLET | ORAL | 0 refills | Status: DC

## 2018-08-09 MED FILL — CONCERTA ER 27 MG TABLET: 27 | 30 days supply | Qty: 30 | Fill #0

## 2018-08-09 NOTE — Patient Instructions (Addendum)
Try the concerta Call if any side effects F/U 3 months

## 2018-08-09 NOTE — Progress Notes (Signed)
   Subjective:    Patient ID: Paula King, female    DOB: 1980-07-09, 38 y.o.   MRN: [id number]  Patient presents for Follow-up (Could not tolerate Vyvanse)  Patient here for interim follow-up on her Vyvanse for her ADD.  Her last visit she was started on stimulant medication is help with concentration and focus.  She had had some underlying depression as well but recently suffered a miscarriage.  She is having difficulty concentrating at work cannot stay on task decided to try the stimulant medication first also had fatigue which can be a symptom of both her ADD as well as depression.  Today she is here for follow-up.  She took the medication she was able to focus within the first 2 hours but then became more anxious and moody with the medication.  She did take it for about a month but patient could tell a difference in her symptoms seemed like she was also speaking very quickly.  She would like to try something different.  Her blood pressure also elevated mildly.   Review Of Systems:  GEN- denies fatigue, fever, weight loss,weakness, recent illness HEENT- denies eye drainage, change in vision, nasal discharge, CVS- denies chest pain, palpitations RESP- denies SOB, cough, wheeze ABD- denies N/V, change in stools, abd pain GU- denies dysuria, hematuria, dribbling, incontinence MSK- denies joint pain, muscle aches, injury Neuro- denies headache, dizziness, syncope, seizure activity       Objective:    BP 130/82   Pulse 76   Temp 98.5 F (36.9 C) (Oral)   Resp 18   Ht 5\' 4"  (1.626 m)   Wt 266 lb (120.7 kg)   LMP 02/06/2018   SpO2 98%   BMI 45.66 kg/m  GEN- NAD, alert and oriented x3 HEENT- PERRL, EOMI, non injected sclera, pink conjunctiva, MMM, oropharynx clear CVS- RRR, no murmur RESP-CTAB Psych- Normal affect and mood  Pulses- Radial 2+        Assessment & Plan:      Problem List Items Addressed This Visit      Unprioritized   ADD (attention deficit disorder) -  Primary    Give trial of a different stimulant may be the metabolism of Concerta will do better.  We will start her at 27 mg once a day.  She is failed Vyvanse she is failed Strattera.  She will call me if she has any side effects with the medication.  We will follow-up in a couple months and see how she is doing.         Note: This dictation was prepared with Dragon dictation along with smaller phrase technology. Any transcriptional errors that result from this process are unintentional.

## 2018-08-09 NOTE — Assessment & Plan Note (Signed)
Give trial of a different stimulant may be the metabolism of Concerta will do better.  We will start her at 27 mg once a day.  She is failed Vyvanse she is failed Strattera.  She will call me if she has any side effects with the medication.  We will follow-up in a couple months and see how she is doing.

## 2018-08-30 NOTE — Telephone Encounter (Signed)
Note sent to nurse. 

## 2018-09-13 ENCOUNTER — Encounter: Payer: Self-pay | Admitting: Family Medicine

## 2018-09-13 ENCOUNTER — Telehealth: Payer: Self-pay | Admitting: Family Medicine

## 2018-09-13 MED ORDER — AMPHETAMINE-DEXTROAMPHET ER 20 MG PO CP24: 20.0000 mg | ORAL_CAPSULE | ORAL | 0 refills | Status: DC

## 2018-09-13 MED FILL — ADDERALL XR 20 MG CAP SA: 20 | 30 days supply | Qty: 30 | Fill #0

## 2018-09-13 NOTE — Telephone Encounter (Signed)
See my chart, HA with concerta has also failed vyvanse  Trial of adderall 20mg  XR - sent to pharmacy

## 2018-09-13 NOTE — Telephone Encounter (Signed)
Pt aware via mychart 

## 2018-09-21 IMAGING — US US THYROID
1 series · 14 of 25 positions shown · non-contrast
Comparison: None.

CLINICAL DATA: Goiter.  Suspected thyromegaly.

EXAM:
THYROID ULTRASOUND
TECHNIQUE: Ultrasound examination of the thyroid gland and adjacent soft
tissues was performed.

[Series 1: us thyroid · 0.09mm/px · 14 of 76 slices shown]
[im 1/76]
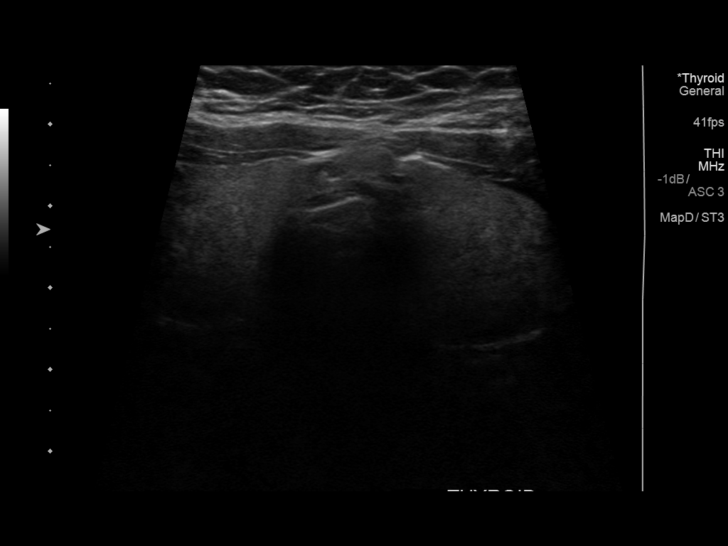
[im 7/76]
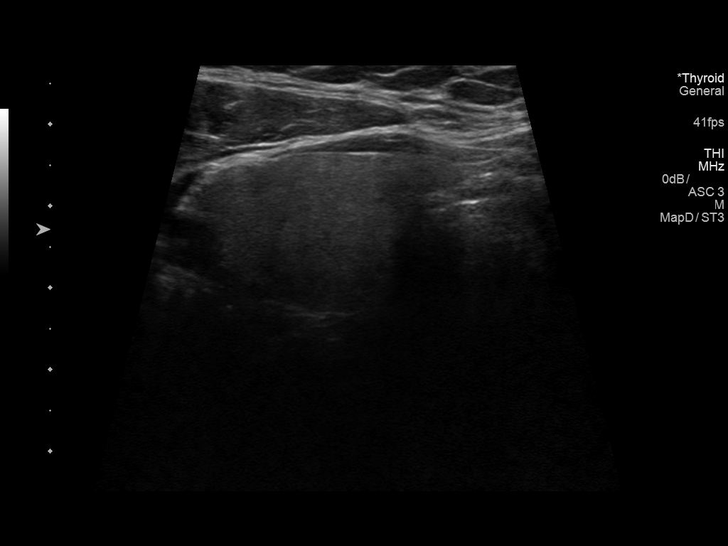
[im 13/76]
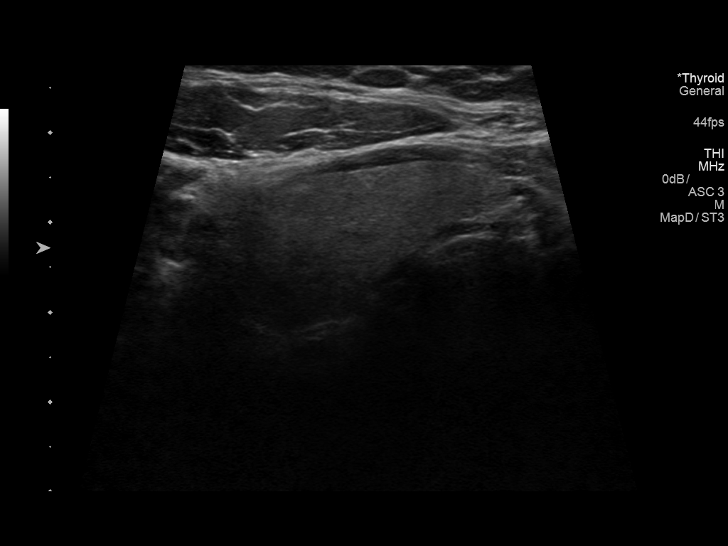
[im 19/76]
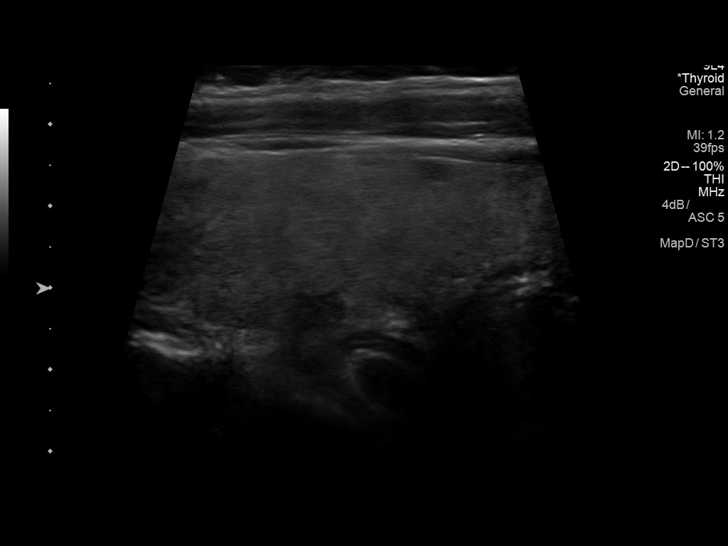
[im 26/76]
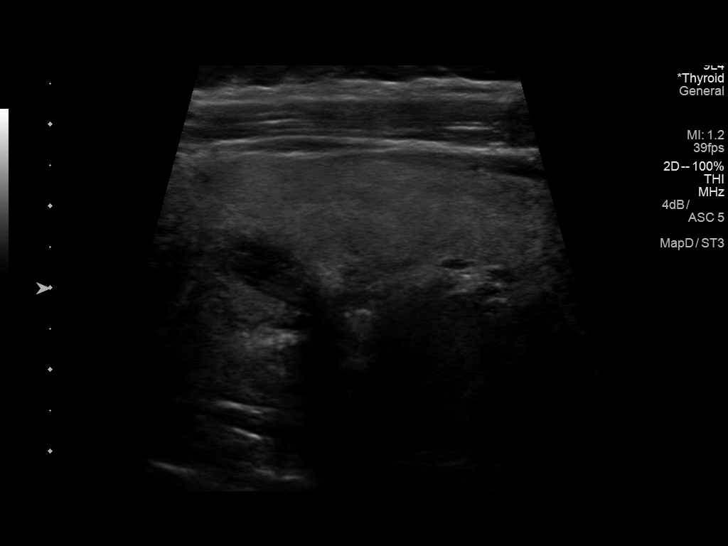
[im 29/76]
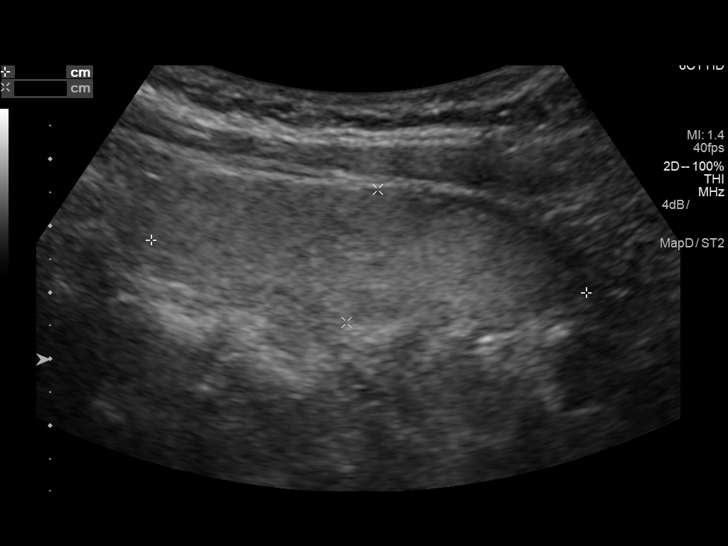
[im 35/76]
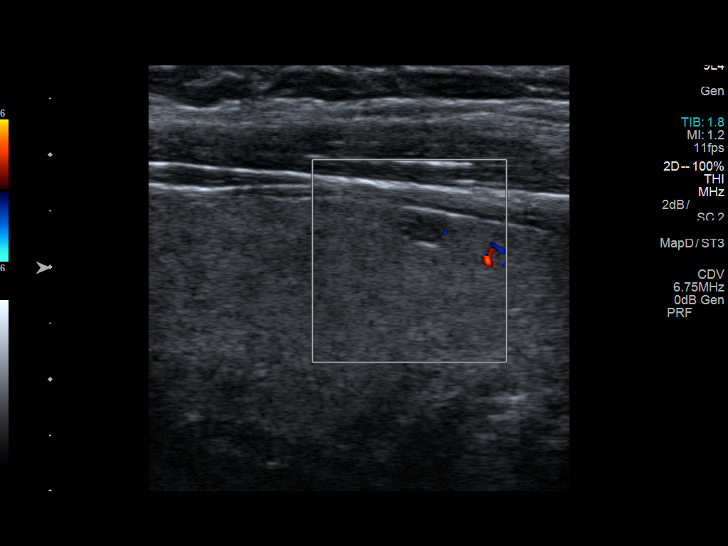
[im 41/76]
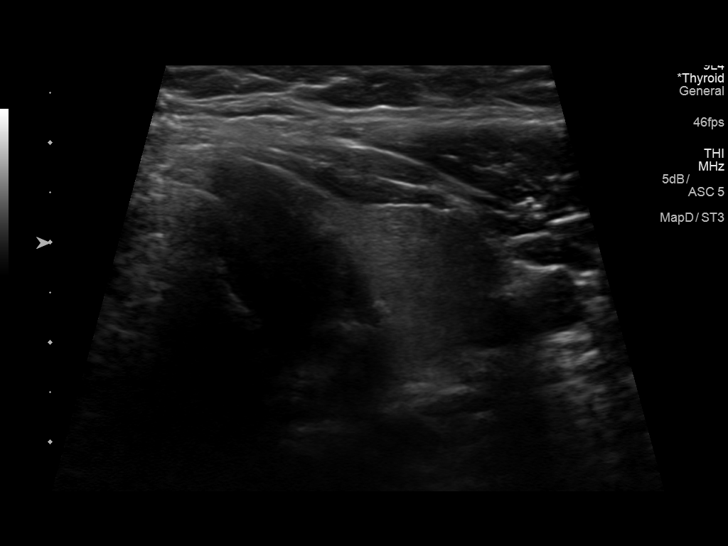
[im 47/76]
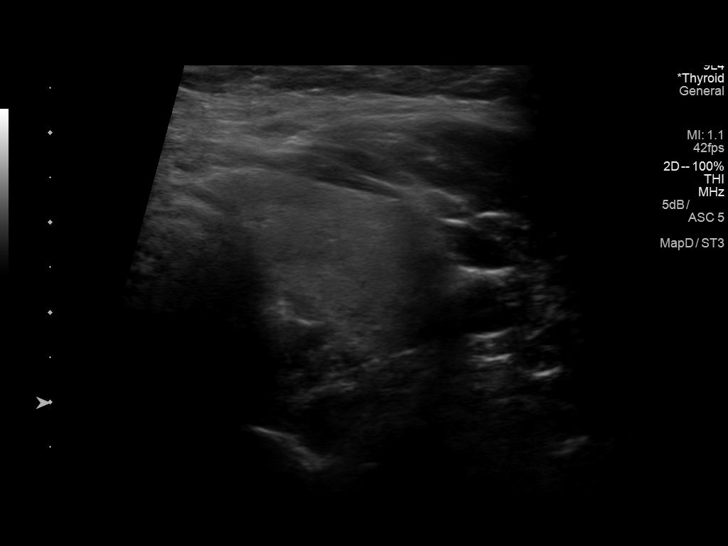
[im 51/76]
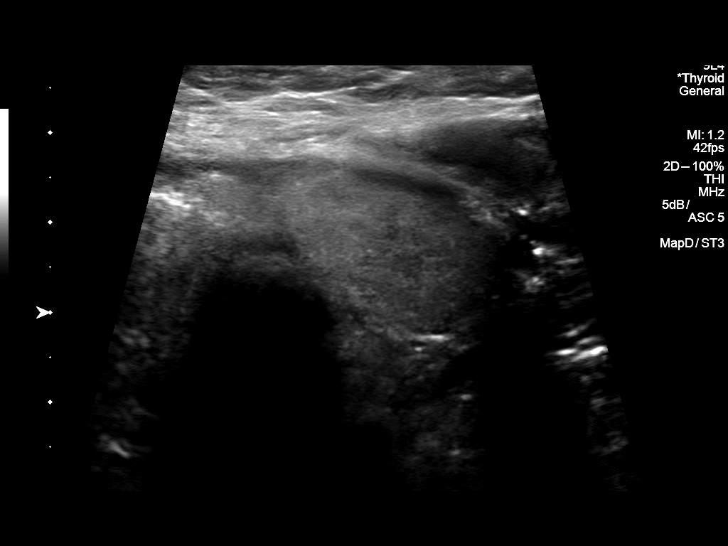
[im 57/76]
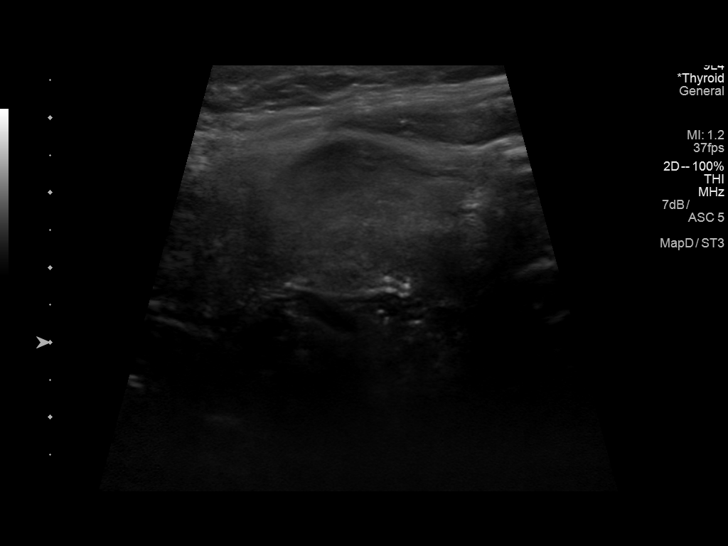
[im 63/76]
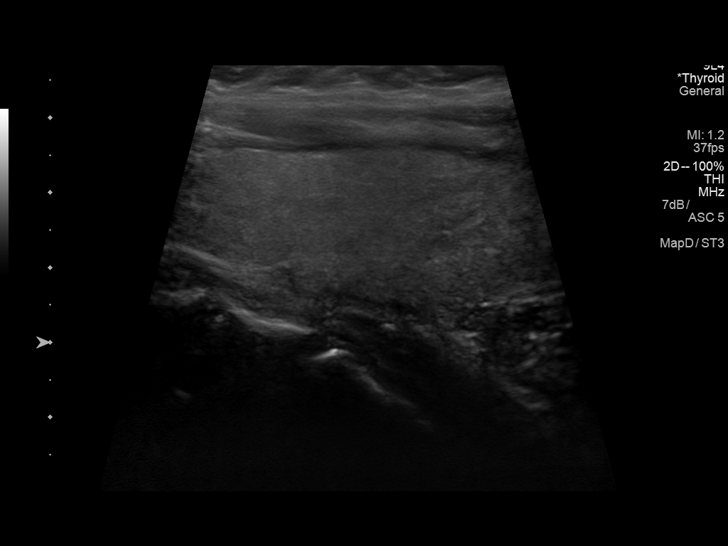
[im 69/76]
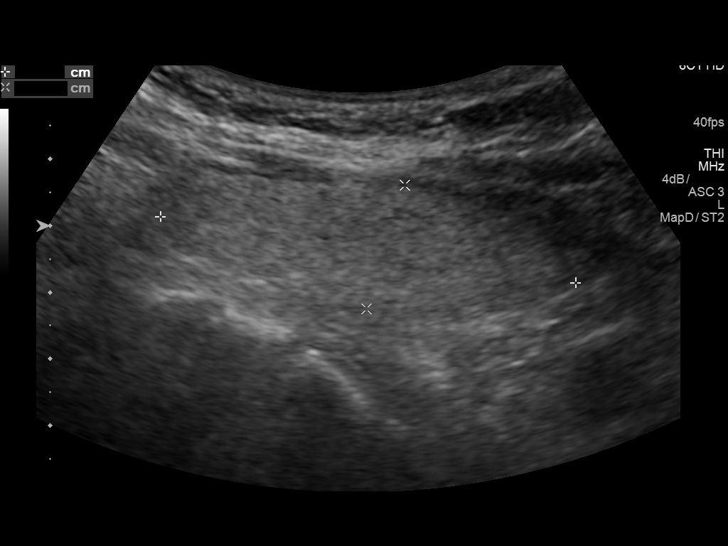
[im 76/76]
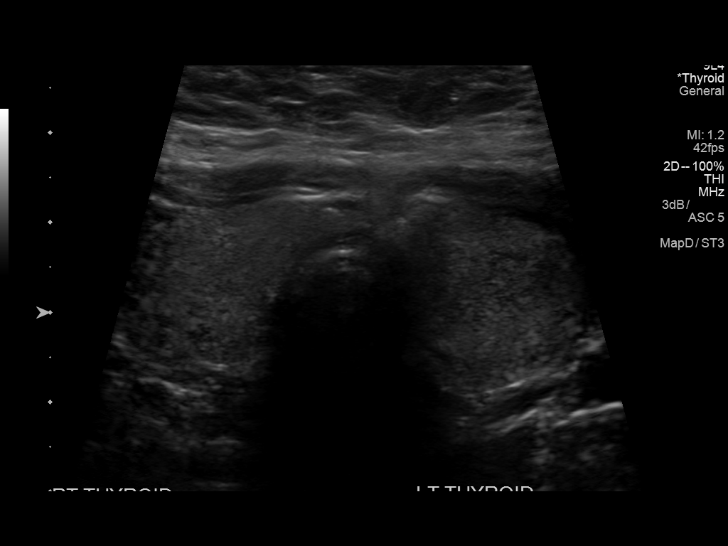

[14 of 25 positions shown; findings below may reference images not displayed]

FINDINGS: Parenchymal Echotexture: Mildly heterogenous

Isthmus: 0.6 cm thickness

Right lobe: 6.6 x 2.8 x 2.1 cm

Left lobe: 6.3 x 2.1 x 1.9 cm

_________________________________________________________

Estimated total number of nodules >/= 1 cm: 0

Number of spongiform nodules >/=  2 cm not described below (TR1): 0

Number of mixed cystic and solid nodules >/= 1.5 cm not described
below (TR2): 0

_________________________________________________________

0.6 cm complex cyst, lower pole right lobe. No discrete nodules are
seen within the thyroid gland.
IMPRESSION: 1. Thyromegaly. No nodule or other indication for biopsy or
dedicated imaging follow-up.

The above is in keeping with the ACR TI-RADS recommendations - [HOSPITAL] [CJ];[DATE].

## 2018-10-19 ENCOUNTER — Encounter: Payer: Self-pay | Admitting: Family Medicine

## 2018-10-19 ENCOUNTER — Other Ambulatory Visit: Payer: Self-pay | Admitting: Family Medicine

## 2018-10-19 MED ORDER — ATENOLOL 25 MG PO TABS: 25.0000 mg | ORAL_TABLET | Freq: Every day | ORAL | 3 refills | Status: DC

## 2018-10-19 MED ORDER — AMPHETAMINE-DEXTROAMPHET ER 20 MG PO CP24: 20.0000 mg | ORAL_CAPSULE | ORAL | 0 refills | Status: DC

## 2018-10-19 NOTE — Telephone Encounter (Signed)
Ok to refill??  Last office visit 08/09/2018.  Last refill 09/13/2018.

## 2018-10-20 ENCOUNTER — Encounter: Payer: Self-pay | Admitting: Family Medicine

## 2018-10-21 MED FILL — ATENOLOL 25 MG TABLET: 25 | 90 days supply | Qty: 90 | Fill #0

## 2018-10-23 MED ORDER — AMPHETAMINE-DEXTROAMPHET ER 20 MG PO CP24: 20.0000 mg | ORAL_CAPSULE | ORAL | 0 refills | Status: DC

## 2018-10-23 MED FILL — ADDERALL XR 20 MG CAP SA: 20 | 30 days supply | Qty: 30 | Fill #0

## 2018-10-23 NOTE — Telephone Encounter (Signed)
Ok to re-send to [facility]?

## 2018-10-24 NOTE — Telephone Encounter (Signed)
Message sent to provider 

## 2018-11-27 ENCOUNTER — Other Ambulatory Visit: Payer: Self-pay

## 2018-11-27 ENCOUNTER — Ambulatory Visit (INDEPENDENT_AMBULATORY_CARE_PROVIDER_SITE_OTHER): Payer: No Typology Code available for payment source | Admitting: Family Medicine

## 2018-11-27 ENCOUNTER — Encounter: Payer: Self-pay | Admitting: Family Medicine

## 2018-11-27 VITALS — BP 126/86 | HR 86 | Temp 98.5°F | Resp 16 | Ht 64.0 in | Wt 265.0 lb

## 2018-11-27 DIAGNOSIS — F909 Attention-deficit hyperactivity disorder, unspecified type: Secondary | ICD-10-CM

## 2018-11-27 DIAGNOSIS — R000 Tachycardia, unspecified: Secondary | ICD-10-CM

## 2018-11-27 MED ORDER — AMPHETAMINE-DEXTROAMPHET ER 30 MG PO CP24: 30.0000 mg | ORAL_CAPSULE | Freq: Every day | ORAL | 0 refills | Status: DC

## 2018-11-27 MED FILL — ADDERALL XR 30 MG CAP SA: 30 | 30 days supply | Qty: 30 | Fill #0

## 2018-11-27 NOTE — Progress Notes (Signed)
   Subjective:    Patient ID: Paula King, female    DOB: [birth year], 38 y.o.   MRN: [id number]  Patient presents for Follow-up Pt here to f/u chronic medical problems   ADHD- taking adderrall, feels attention deficit resurfacing mid day, feels dose is not strong enough and meds wearing off.   Came off atenolol, HR was dropping down to the 50's, Discontined on 5/18   HR 66-8, Walking daily   [place] for Weight loss- 1 month ago - weight loss coaching - given [employer]/ Yellow foods- protein/ Red foods- portion control/carbs  Feels good emotional  BP 121-142/77-96 at home   Review Of Systems:  GEN- denies fatigue, fever, weight loss,weakness, recent illness HEENT- denies eye drainage, change in vision, nasal discharge, CVS- denies chest pain, palpitations RESP- denies SOB, cough, wheeze ABD- denies N/V, change in stools, abd pain GU- denies dysuria, hematuria, dribbling, incontinence MSK- denies joint pain, muscle aches, injury Neuro- denies headache, dizziness, syncope, seizure activity       Objective:    BP 126/86   Pulse 86   Temp 98.5 F (36.9 C) (Oral)   Resp 16   Ht 5\' 4"  (1.626 m)   Wt 265 lb (120.2 kg)   LMP 02/06/2018   SpO2 99%   BMI 45.49 kg/m  GEN- NAD, alert and oriented x3,obese HEENT- PERRL, EOMI, non injected sclera, pink conjunctiva, MMM, oropharynx clear Neck- Supple, no thyromegaly CVS- RRR, no murmur RESP-CTAB Psych- normal affect and mood EXT- No edema Pulses- Radial  2+        Assessment & Plan:      Problem List Items Addressed This Visit      Unprioritized   ADD (attention deficit disorder) - Primary    Increase adderall to 30mg  once a day       Morbid obesity ([facility])   Relevant Medications   amphetamine-dextroamphetamine (ADDERALL XR) 30 MG 24 hr capsule   Tachycardia    HR has come down and BP with changes in her diet She will continue to monitor off the meds Also lower stress levels not working right now If this reoccurs  will restart         Note: This dictation was prepared with Dragon dictation along with smaller phrase technology. Any transcriptional errors that result from this process are unintentional.

## 2018-11-27 NOTE — Patient Instructions (Addendum)
F/U  6 MONTHS FOR PHYSICAL

## 2018-11-28 ENCOUNTER — Encounter: Payer: Self-pay | Admitting: Family Medicine

## 2018-11-28 NOTE — Assessment & Plan Note (Signed)
HR has come down and BP with changes in her diet She will continue to monitor off the meds Also lower stress levels not working right now If this reoccurs will restart

## 2018-11-28 NOTE — Assessment & Plan Note (Signed)
Increase adderall to 30mg  once a day

## 2018-12-12 ENCOUNTER — Telehealth: Payer: Self-pay | Admitting: "Endocrinology

## 2018-12-12 NOTE — Telephone Encounter (Signed)
Patient would like her lab order faxed to [facility] on [address] in [place]. Also, she wants to know does she need to have a repeat CT scan?

## 2018-12-12 NOTE — Telephone Encounter (Signed)
Lab order has been faxed over patient aware no CT scan is needed

## 2018-12-13 ENCOUNTER — Other Ambulatory Visit: Payer: Self-pay | Admitting: "Endocrinology

## 2018-12-13 NOTE — Telephone Encounter (Signed)
Tried ot call patient back but got no answer

## 2018-12-13 NOTE — Telephone Encounter (Signed)
Patient just went to [facility] to do yearly labs. She said she usually does a 24 hour urine and did not have order for it. Does she need one?

## 2018-12-15 ENCOUNTER — Ambulatory Visit: Payer: No Typology Code available for payment source | Admitting: "Endocrinology

## 2018-12-19 LAB — TSH: TSH: 2.62 u[IU]/mL (ref 0.450–4.500)

## 2018-12-19 LAB — HGB A1C W/O EAG: Hgb A1c MFr Bld: 5.6 % (ref 4.8–5.6)

## 2018-12-19 LAB — METANEPHRINES, PLASMA
Metanephrine, Free: <10 pg/mL (ref 0.0–88.0)
Normetanephrine, Free: 42.9 pg/mL (ref 0.0–110.1)

## 2018-12-19 LAB — T4, FREE: Free T4: 1.06 ng/dL (ref 0.82–1.77)

## 2018-12-28 ENCOUNTER — Other Ambulatory Visit: Payer: Self-pay

## 2018-12-28 ENCOUNTER — Ambulatory Visit (INDEPENDENT_AMBULATORY_CARE_PROVIDER_SITE_OTHER): Payer: No Typology Code available for payment source | Admitting: "Endocrinology

## 2018-12-28 ENCOUNTER — Encounter: Payer: Self-pay | Admitting: "Endocrinology

## 2018-12-28 DIAGNOSIS — D3501 Benign neoplasm of right adrenal gland: Secondary | ICD-10-CM

## 2018-12-28 MED ORDER — DEXAMETHASONE 1 MG PO TABS: 1.0000 mg | ORAL_TABLET | Freq: Once | ORAL | 0 refills | Status: AC

## 2018-12-28 MED FILL — DEXAMETHASONE 1 MG TABLET: 1 | 1 days supply | Qty: 1 | Fill #0

## 2018-12-28 NOTE — Progress Notes (Signed)
12/28/2018, 12:42 PM                                Endocrinology Telehealth Visit Follow up Note -During COVID -19 Pandemic  I connected with Paula King on 12/28/2018   by telephone and verified that I am speaking with the correct person using two identifiers. Paula King, [birth year]. she has verbally consented to this visit. All issues noted in this document were discussed and addressed. The format was not optimal for physical exam   Subjective:    Patient ID: Paula King, female    DOB: September 19, 1980, PCP Alycia Rossetti, MD   Past Medical History:  Diagnosis Date  . 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 Diverticulitis   . Hypertension   . Preeclampsia    Past Surgical History:  Procedure Laterality Date  . CESAREAN SECTION    . CHOLECYSTECTOMY    . WISDOM TOOTH EXTRACTION     Social History   Socioeconomic History  . Marital status: Single    Spouse name: Not on file  . Number of children: 1  . Years of education: Not on file  . Highest education level: Some college, no degree  Occupational History    Employer: [employer]  Social Needs  . Financial resource strain: Not hard at all  . Food insecurity    Worry: Never true    Inability: Never true  . Transportation needs    Medical: No    Non-medical: No  Tobacco Use  . Smoking status: Never Smoker  . Smokeless tobacco: Never Used  Substance and Sexual Activity  . Alcohol use: No  . Drug use: No  . Sexual activity: Yes    Birth control/protection: None  Lifestyle  . Physical activity    Days per week: 2 days    Minutes per session: 30 min  . Stress: Only a little  Relationships  . Social connections    Talks on phone: More than three times a week    Gets together: More than three times a week    Attends religious service: Never    Active member of club or organization: No    Attends meetings of clubs or organizations: Never    Relationship status: Never  married  Other Topics Concern  . Not on file  Social History Narrative   Lives in [place], [place].   Single, not dating.    Has one child, age 74, 34.   Has family in [place], [place].   Enjoys swimming.   Enjoys time with family.    Eats all foods.   Wears seatbelt.    Outpatient Encounter Medications as of 12/28/2018  Medication Sig  . amphetamine-dextroamphetamine (ADDERALL XR) 30 MG 24 hr capsule Take 1 capsule (30 mg total) by mouth daily.  . cetirizine (ZYRTEC) 10 MG tablet Take 10 mg by mouth as needed for allergies.  . cholecalciferol (VITAMIN D) 1000 units tablet   . Cyanocobalamin (B-12) 1000 MCG TABS   . dexamethasone (DECADRON) 1 MG tablet Take 1 tablet (1 mg total) by mouth once for 1 dose.   No facility-administered encounter medications on file as of  12/28/2018.    ALLERGIES: Allergies  Allergen Reactions  . Ciprofloxacin     IV Only-Patient started sweating and becoming clammy when was infused with IV cipro on 08/09/14. Can tolerate PO Ciprofloxacin     VACCINATION STATUS:  There is no immunization history on file for this patient.  HPI Paula King is 38 y.o. female who presents today with a medical history as above. she is being engaged in telehealth for follow up for right adrenal adenoma . - Her history starts in February 2016 when she underwent CT scan of her abdomen due to left lower quadrant pain which incidentally showed 2.7 cm mass  on her right adrenal gland which was reported to be consistent with adenoma. - Repeat CT abdomen in November 2018 revealed that the adenoma persisted and measured 3 cm ( HU 0) still favoring adenoma. -She underwent hormonal work-up to determine functional status of this adenoma.  Findings have not been conclusive, except for one instance of 24-hour urine free cortisol at 59. -Her subsequent functional studies were within normal limits, did not require definitive intervention and was put on observation with plan to repeat plasma  free metanephrines, A1c, and thyroid function tests.  These are all within normal limits.  She continues to gain weight, reports gaining 10 pounds since last visit.  -She still complains of fatigue, likely related to her sleep apnea.  - She has history of prediabetes, hypertension which required 1 medication with atenolol 25 mg by mouth daily.  - She has 1 child, 36 years of age. - She reports that she has tried to lose weight by increasing her exercise and dietary modifications. - She denies diaphoresis, uncontrolled hypertension, headaches.  Review of Systems Limited as above.  Objective:    LMP 02/06/2018   Wt Readings from Last 3 Encounters:  11/27/18 265 lb (120.2 kg)  08/09/18 266 lb (120.7 kg)  05/24/18 264 lb (119.7 kg)    Physical Exam    CMP ( most recent) CMP     Component Value Date/Time   NA 141 05/24/2018 1133   K 4.1 05/24/2018 1133   CL 106 05/24/2018 1133   CO2 24 05/24/2018 1133   GLUCOSE 78 05/24/2018 1133   BUN 16 05/24/2018 1133   CREATININE 0.88 05/24/2018 1133   CALCIUM 9.2 05/24/2018 1133   PROT 6.9 05/24/2018 1133   ALBUMIN 3.1 (L) 08/10/2014 0148   AST 23 05/24/2018 1133   ALT 23 05/24/2018 1133   ALKPHOS 77 08/10/2014 0148   BILITOT 0.4 05/24/2018 1133   GFRNONAA 64 04/18/2017 1041   GFRAA 74 04/18/2017 1041     Diabetic Labs (most recent): Lab Results  Component Value Date   HGBA1C 5.6 12/13/2018   HGBA1C 5.4 11/18/2017   HGBA1C 5.4 05/24/2017     Lipid Panel ( most recent) Lipid Panel     Component Value Date/Time   CHOL 147 05/24/2018 1133   TRIG 105 05/24/2018 1133   HDL 43 (L) 05/24/2018 1133   CHOLHDL 3.4 05/24/2018 1133   LDLCALC 84 05/24/2018 1133     Recent Results (from the past 2160 hour(s))  Metanephrines, plasma     Status: None   Collection Time: 12/13/18 10:47 AM  Result Value Ref Range   Normetanephrine, Free 42.9 0.0 - 110.1 pg/mL    Comment:               **Please note reference interval change**    Metanephrine, Free <10.0 0.0 - 88.0 pg/mL  Comment:               **Please note reference interval change**  Hgb A1c w/o eAG     Status: None   Collection Time: 12/13/18 10:47 AM  Result Value Ref Range   Hgb A1c MFr Bld 5.6 4.8 - 5.6 %    Comment:          Prediabetes: 5.7 - 6.4          Diabetes: >6.4          Glycemic control for adults with diabetes: <7.0   T4, free     Status: None   Collection Time: 12/13/18 10:47 AM  Result Value Ref Range   Free T4 1.06 0.82 - 1.77 ng/dL  TSH     Status: None   Collection Time: 12/13/18 10:47 AM  Result Value Ref Range   TSH 2.620 0.450 - 4.500 uIU/mL     Lab Results  Component Value Date   TSH 2.620 12/13/2018   TSH 1.83 04/18/2017   FREET4 1.06 12/13/2018    Most recent 24-hour urine free cortisol on November 28, 2017 was 14 improving from 3.  05/10/2017   CT abdomen  Radiology description of her right adrenal mass: Adrenal: Previously noted right adrenal mass is similar to the prior examination measuring 3.0 x 2.6 cm and is diffusely low-attenuation (0 HU), compatible with an adenoma. Left adrenal gland and bilateral kidneys are normal in appearance. No hydroureteronephrosis in the visualized portions of the abdomen.    Assessment & Plan:   1. Adrenal adenoma, right  -3 cm stable adenoma.  Radiologic features favoring benign adenoma. - a series of functional studies have been normal. -She will not need surgical intervention at this time. -She will need repeat overnight dexamethasone suppression test to rule out subclinical Cushing's syndrome.  This test is ordered to be done as soon as possible, and will be contacted if abnormal.  2) morbid obesity -This is likely unrelated to her adrenal adenoma.  This is her major health concern and she continues to gain weight progressively.  Her recent A1c is 5.6% indicating absence of prediabetes/diabetes. - she  admits there is a room for improvement in her diet and drink choices. -   Suggestion is made for her to avoid simple carbohydrates  from her diet including Cakes, Sweet Desserts / Pastries, Ice Cream, Soda (diet and regular), Sweet Tea, Candies, Chips, Cookies, Sweet Pastries,  Store Bought Juices, Alcohol in Excess of  1-2 drinks a day, Artificial Sweeteners, Coffee Creamer, and "Sugar-free" Products. This will help patient to have stable blood glucose profile and potentially avoid unintended weight gain.  He wants to for her other options to address his weight problem.  She may need surgical weight loss which is briefly discussed today with her.  We will discuss in more details visit in 3 months.   3. Goiter - She does not have family history of thyroid cancer.  She has significant thyromegaly with no discrete nodules, euthyroid based on her thyroid function tests, no intervention at this time.     - I advised patient to maintain close follow up with Alycia Rossetti, MD for primary care needs.   Time for this visit: 15 minutes. Paula Hatchet Ehrich  participated in the discussions, expressed understanding, and voiced agreement with the above plans.  All questions were answered to her satisfaction. she is encouraged to contact clinic should she have any questions or concerns prior to her  return visit.  Follow up plan: Return in about 3 months (around 03/30/2019), or She will do a.m. cortisol after 1 mg of dexamethasone at 11 PM the night before, for Follow up with Pre-visit Labs.   Glade Lloyd, MD [facility] [address] [place], [place] [address] Phone: [phone]  Fax: [phone]     12/28/2018, 12:42 PM  This note was partially dictated with voice recognition software. Similar sounding words can be transcribed inadequately or may not  be corrected upon review.

## 2019-01-08 MED FILL — ARMOUR THYROID 30 MG TABLET: 30 | 30 days supply | Qty: 30 | Fill #0

## 2019-01-09 ENCOUNTER — Telehealth: Payer: Self-pay | Admitting: "Endocrinology

## 2019-01-09 NOTE — Telephone Encounter (Signed)
Routing to Dr Nida 

## 2019-01-09 NOTE — Telephone Encounter (Signed)
Patient called and said that she went to [facility] this morning with her [facility] order and they would not draw her labs. She said that Dr Dorris Fetch had her take one pill and was suppose to have them drawn by 8:30. She would like this to be resolved when nurse is back in office. Thanks

## 2019-01-10 NOTE — Telephone Encounter (Signed)
Paula King is willing to do the lab again in 15 days she states she will be going to [facility] this time

## 2019-01-10 NOTE — Telephone Encounter (Signed)
This is unfortunate. If she did not get this drawn , it will need to be repeated after 15 days.

## 2019-01-11 ENCOUNTER — Other Ambulatory Visit: Payer: Self-pay | Admitting: "Endocrinology

## 2019-01-11 DIAGNOSIS — D3501 Benign neoplasm of right adrenal gland: Secondary | ICD-10-CM

## 2019-01-11 MED ORDER — DEXAMETHASONE 1 MG PO TABS: 1.0000 mg | ORAL_TABLET | Freq: Once | ORAL | 0 refills | Status: AC

## 2019-01-11 MED FILL — DEXAMETHASONE 1 MG TABLET: 1 | 1 days supply | Qty: 1 | Fill #0

## 2019-01-11 NOTE — Telephone Encounter (Signed)
I will send a prescription for dexamethasone to her pharmacy for repeat test 15 days from now.

## 2019-01-12 NOTE — Telephone Encounter (Signed)
Noted  

## 2019-01-23 ENCOUNTER — Ambulatory Visit (INDEPENDENT_AMBULATORY_CARE_PROVIDER_SITE_OTHER): Payer: No Typology Code available for payment source | Admitting: Family Medicine

## 2019-01-23 ENCOUNTER — Encounter: Payer: Self-pay | Admitting: Family Medicine

## 2019-01-23 ENCOUNTER — Other Ambulatory Visit: Payer: Self-pay

## 2019-01-23 DIAGNOSIS — H60502 Unspecified acute noninfective otitis externa, left ear: Secondary | ICD-10-CM | POA: Diagnosis not present

## 2019-01-23 MED ORDER — CIPRODEX 0.3-0.1 % OT SUSP: 4.0000 [drp] | Freq: Two times a day (BID) | OTIC | 0 refills | Status: DC

## 2019-01-23 NOTE — Progress Notes (Signed)
Patient ID: Paula King, female    DOB: 1981/06/10, 38 y.o.   MRN: [id number]  PCP: Alycia Rossetti, MD  Virtual Visit via Telephone  Phone visit arranged with [name] for 01/23/19 at 12:00 PM EDT  Services provided today were via telemedicine through telephone call. Start of phone call:  12:39 PM  I verified that I was speaking with the correct person using two identifiers. Patient reported their location during encounter was at home   Patient consented to telephone visit  I conducted telephone visit from [facility]  Referring Provider:   Alycia Rossetti, MD   All participants in encounter:  Myself and the patient   I discussed the limitations, risks, security and privacy concerns of performing an evaluation and management service by telephone and the availability of in person appointments. I also discussed with the patient that there may be a patient responsible charge related to this service. The patient expressed understanding and agreed to proceed.  No chief complaint on file.   Subjective:   Paula King is a 38 y.o. female, presenpts to clinic with CC of left ear pain after going to the beach.  Pain in left ear severe x 4 days.  She started taking antihistmines xyzal, tylenol/motrin, no improvement.  Pain severe, constant.  Some associated dizziness, pressure - feels like fluid in ear.  Uses cotton/q-tips multiple times a day.  Pain behind her ear and to tragus, no pinna tenderness, visible swelling, redness, drainage.  She works for [employer] and a medical provider in her office checked her ears and said it was inflammed and if he were the one treating he would treat with abx drops.   She denies any sore throat, nasal sx, cough, fever, chills, HA.     Patient Active Problem List   Diagnosis Date Noted  . Pregnant 03/17/2018  . H/O severe pre-eclampsia 03/17/2018  . Previous cesarean section 03/17/2018  . Tachycardia  03/17/2018  . Chronic hypertension during pregnancy, antepartum 03/17/2018  . ADD (attention deficit disorder) 08/22/2017  . Cushings syndrome ([place]) 08/16/2017  . Goiter 07/19/2017  . Morbid obesity ([place]) 07/19/2017  . Diverticulitis of colon 08/09/2014  . Abdominal pain 08/09/2014  . Adrenal adenoma, right 08/09/2014  . Diverticulitis 08/09/2014  . Elevated transaminase level 08/09/2014    Prior to Admission medications   Medication Sig Start Date End Date Taking? Authorizing Provider  amphetamine-dextroamphetamine (ADDERALL XR) 30 MG 24 hr capsule Take 1 capsule (30 mg total) by mouth daily. 11/27/18   Alycia Rossetti, MD  cetirizine (ZYRTEC) 10 MG tablet Take 10 mg by mouth as needed for allergies.    [provider]  cholecalciferol (VITAMIN D) 1000 units tablet  12/26/17   [provider]  Cyanocobalamin (B-12) 1000 MCG TABS  12/26/17   [provider]    Allergies  Allergen Reactions  . Ciprofloxacin     IV Only-Patient started sweating and becoming clammy when was infused with IV cipro on 08/09/14. Can tolerate PO Ciprofloxacin     Review of Systems  Constitutional: Negative.   HENT: Negative.   Eyes: Negative.   Respiratory: Negative.   Cardiovascular: Negative.   Gastrointestinal: Negative.   Endocrine: Negative.   Genitourinary: Negative.   Musculoskeletal: Negative.   Skin: Negative.   Allergic/Immunologic: Negative.   Neurological: Negative.   Hematological: Negative.   Psychiatric/Behavioral: Negative.   All other systems reviewed and are negative.  Objective:    There were no vitals filed for this visit.    Physical Exam  Due to COVID and telephone encounter - no inperson PE done She reports tragus ttp, no pain with manipulation of pinna     Assessment & Plan:     ICD-10-CM   1. Acute otitis externa of left ear, unspecified type  H60.502    ocean swimming + q-tip user, works in medical office provider examined  stated canal was inflammed, suspect otitis externa requiring fluoroquinolone      I discussed the assessment and treatment plan with the patient. The patient was provided an opportunity to ask questions and all were answered. The patient agreed with the plan and demonstrated an understanding of the instructions.   The patient was advised to call back or seek an in-person evaluation if the symptoms worsen or if the condition fails to improve as anticipated.  Phone call concluded at 12:48 PM  I provided 7 minutes of non-face-to-face time during this encounter.  Delsa Grana, PA-C 01/23/19 12:39 PM

## 2019-01-25 ENCOUNTER — Ambulatory Visit (INDEPENDENT_AMBULATORY_CARE_PROVIDER_SITE_OTHER): Payer: No Typology Code available for payment source | Admitting: Family Medicine

## 2019-01-25 ENCOUNTER — Other Ambulatory Visit: Payer: Self-pay

## 2019-01-25 ENCOUNTER — Encounter: Payer: Self-pay | Admitting: Family Medicine

## 2019-01-25 VITALS — BP 130/78 | HR 99 | Temp 98.7°F | Resp 18 | Ht 64.0 in | Wt 265.0 lb

## 2019-01-25 DIAGNOSIS — N926 Irregular menstruation, unspecified: Secondary | ICD-10-CM

## 2019-01-25 DIAGNOSIS — H66002 Acute suppurative otitis media without spontaneous rupture of ear drum, left ear: Secondary | ICD-10-CM | POA: Diagnosis not present

## 2019-01-25 DIAGNOSIS — H9192 Unspecified hearing loss, left ear: Secondary | ICD-10-CM | POA: Diagnosis not present

## 2019-01-25 DIAGNOSIS — H60502 Unspecified acute noninfective otitis externa, left ear: Secondary | ICD-10-CM

## 2019-01-25 DIAGNOSIS — J329 Chronic sinusitis, unspecified: Secondary | ICD-10-CM

## 2019-01-25 LAB — PREGNANCY, URINE: Preg Test, Ur: NEGATIVE

## 2019-01-25 MED ORDER — METHYLPREDNISOLONE ACETATE 40 MG/ML IJ SUSP
40.0000 mg | Freq: Once | INTRAMUSCULAR | Status: AC
  Administered 2019-01-25: 40 mg via INTRAMUSCULAR

## 2019-01-25 MED ORDER — AMOXICILLIN 500 MG PO CAPS: 500.0000 mg | ORAL_CAPSULE | Freq: Three times a day (TID) | ORAL | 0 refills | Status: AC

## 2019-01-25 MED ORDER — AMOXICILLIN-POT CLAVULANATE 875-125 MG PO TABS: 1.0000 | ORAL_TABLET | Freq: Two times a day (BID) | ORAL | 0 refills | Status: AC

## 2019-01-25 MED ORDER — PREDNISONE 20 MG PO TABS: ORAL_TABLET | ORAL | 0 refills | Status: DC

## 2019-01-25 MED ORDER — NEOMYCIN-POLYMYXIN-HC 3.5-10000-1 OT SOLN: 4.0000 [drp] | Freq: Four times a day (QID) | OTIC | 1 refills | Status: AC

## 2019-01-25 MED ORDER — TRAMADOL HCL 50 MG PO TABS: 50.0000 mg | ORAL_TABLET | Freq: Three times a day (TID) | ORAL | 0 refills | Status: AC | PRN

## 2019-01-25 MED ORDER — KETOROLAC TROMETHAMINE 30 MG/ML IJ SOLN
30.0000 mg | Freq: Once | INTRAMUSCULAR | Status: AC
  Administered 2019-01-25: 60 mg via INTRAMUSCULAR

## 2019-01-25 NOTE — Progress Notes (Signed)
Patient ID: Paula King, female    DOB: [birth year], 38 y.o.   MRN: [id number]  PCP: Alycia Rossetti, MD  Chief Complaint  Patient presents with  . Otalgia    L ear, started 07/25, OTC meds were taken with no relief    Subjective:   Paula King is a 38 y.o. female, presents to clinic with CC of worsening left ear pain despite using antibiotic drops that were prescribed.  She states that her pain is severe, kept her up all night she was in tears and pain, she did take high-dose ibuprofen again and it has helped a little bit and currently feels that her pain to her left ear is 8/10.  She continues to have swelling pain to the outer ear, pain felt inside her ear, does not have fever chills sweats nasal symptoms sore throat.   Patient Active Problem List   Diagnosis Date Noted  . Pregnant 03/17/2018  . H/O severe pre-eclampsia 03/17/2018  . Previous cesarean section 03/17/2018  . Tachycardia 03/17/2018  . Chronic hypertension during pregnancy, antepartum 03/17/2018  . ADD (attention deficit disorder) 08/22/2017  . 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 Goiter 07/19/2017  . Morbid obesity ([place]) 07/19/2017  . Diverticulitis of colon 08/09/2014  . Abdominal pain 08/09/2014  . Adrenal adenoma, right 08/09/2014  . Diverticulitis 08/09/2014  . Elevated transaminase level 08/09/2014     Prior to Admission medications   Medication Sig Start Date End Date Taking? Authorizing Provider  cetirizine (ZYRTEC) 10 MG tablet Take 10 mg by mouth as needed for allergies.   Yes [provider]  cholecalciferol (VITAMIN D) 1000 units tablet  12/26/17  Yes [provider]  ciprofloxacin-dexamethasone (CIPRODEX) OTIC suspension Place 4 drops into the left ear 2 (two) times daily. 01/23/19  Yes Delsa Grana, PA-C  Cyanocobalamin (B-12) 1000 MCG TABS  12/26/17  Yes [provider]     Allergies  Allergen Reactions  . Ciprofloxacin     IV Only-Patient started  sweating and becoming clammy when was infused with IV cipro on 08/09/14. Can tolerate PO Ciprofloxacin      Family History  Problem Relation Age of Onset  . Hypertension Father   . Diabetes Father   . Anxiety disorder Mother   . Depression Mother   . Cancer Sister        throat   . Hypertension Paternal Grandmother   . Hypertension Maternal Grandmother   . Asthma Daughter      Social History   Socioeconomic History  . Marital status: Single    Spouse name: Not on file  . Number of children: 1  . Years of education: Not on file  . Highest education level: Some college, no degree  Occupational History    Employer: [employer]  Social Needs  . Financial resource strain: Not hard at all  . Food insecurity    Worry: Never true    Inability: Never true  . Transportation needs    Medical: No    Non-medical: No  Tobacco Use  . Smoking status: Never Smoker  . Smokeless tobacco: Never Used  Substance and Sexual Activity  . Alcohol use: No  . Drug use: No  . Sexual activity: Yes    Birth control/protection: None  Lifestyle  . Physical activity    Days per week: 2 days    Minutes per session: 30 min  . Stress: Only a little  Relationships  . Social connections  Talks on phone: More than three times a week    Gets together: More than three times a week    Attends religious service: Never    Active member of club or organization: No    Attends meetings of clubs or organizations: Never    Relationship status: Never married  . Intimate partner violence    Fear of current or ex partner: No    Emotionally abused: No    Physically abused: No    Forced sexual activity: No  Other Topics Concern  . Not on file  Social History Narrative   Lives in [place], [place].   Single, not dating.    Has one child, age 42, 84.   Has family in [place], [place].   Enjoys swimming.   Enjoys time with family.    Eats all foods.   Wears seatbelt.      Review of Systems   Constitutional: Negative.   HENT: Negative.   Eyes: Negative.   Respiratory: Negative.   Cardiovascular: Negative.   Gastrointestinal: Negative.   Endocrine: Negative.   Genitourinary: Negative.   Musculoskeletal: Negative.   Skin: Negative.   Allergic/Immunologic: Negative.   Neurological: Negative.   Hematological: Negative.   Psychiatric/Behavioral: Negative.   All other systems reviewed and are negative.      Objective:    Vitals:   01/25/19 0906  BP: 130/78  Pulse: 99  Resp: 18  Temp: 98.7 F (37.1 C)  TempSrc: Oral  SpO2: 96%  Weight: 265 lb (120.2 kg)  Height: 5\' 4"  (1.626 m)      Physical Exam Vitals signs and nursing note reviewed.  Constitutional:      Appearance: She is well-developed.  HENT:     Head: Normocephalic and atraumatic.     Right Ear: Hearing normal.     Ears:     Comments: Right external ear and auditory canal normal, right tympanic membrane slightly dull with most landmarks visible Left external ear very tender to palpation to tragus which is visibly edematous and erythematous, canal severely swollen and erythematous without any purulent discharge.  Left TM technically difficult to visualize but with pediatric otoscope cover was able to see that the tympanic membrane is intact, opaque and erythematous and injected slightly bulging    Nose: Mucosal edema and congestion present.     Right Turbinates: Enlarged and swollen.     Left Turbinates: Enlarged and swollen.     Right Sinus: No maxillary sinus tenderness or frontal sinus tenderness.     Left Sinus: No maxillary sinus tenderness or frontal sinus tenderness.     Mouth/Throat:     Mouth: Mucous membranes are moist.     Pharynx: Oropharynx is clear. Uvula midline. Posterior oropharyngeal erythema present.     Tonsils: No tonsillar exudate.  Eyes:     General:        Right eye: No discharge.        Left eye: No discharge.     Conjunctiva/sclera: Conjunctivae normal.  Neck:      Trachea: No tracheal deviation.  Cardiovascular:     Rate and Rhythm: Normal rate and regular rhythm.  Pulmonary:     Effort: Pulmonary effort is normal. No respiratory distress.     Breath sounds: No stridor.  Musculoskeletal: Normal range of motion.  Skin:    General: Skin is warm and dry.     Findings: No rash.  Neurological:     Mental Status: She is alert.  Motor: No abnormal muscle tone.     Coordination: Coordination normal.  Psychiatric:        Behavior: Behavior normal.           Assessment & Plan:      ICD-10-CM   1. Acute otitis externa of left ear, unspecified type  H60.502 amoxicillin-clavulanate (AUGMENTIN) 875-125 MG tablet    amoxicillin (AMOXIL) 500 MG capsule    neomycin-polymyxin-hydrocortisone (CORTISPORIN) OTIC solution    traMADol (ULTRAM) 50 MG tablet    ketorolac (TORADOL) 30 MG/ML injection 30 mg    methylPREDNISolone acetate (DEPO-MEDROL) injection 40 mg    Ambulatory referral to ENT    predniSONE (DELTASONE) 20 MG tablet  2. Non-recurrent acute suppurative otitis media of left ear without spontaneous rupture of tympanic membrane  H66.002 amoxicillin-clavulanate (AUGMENTIN) 875-125 MG tablet    amoxicillin (AMOXIL) 500 MG capsule    traMADol (ULTRAM) 50 MG tablet    ketorolac (TORADOL) 30 MG/ML injection 30 mg    methylPREDNISolone acetate (DEPO-MEDROL) injection 40 mg    Ambulatory referral to ENT  3. Irregular menses  N92.6 Pregnancy, urine  4. Decreased hearing of left ear  H91.92 Ambulatory referral to ENT  5. Rhinosinusitis  J32.9 Ambulatory referral to ENT    predniSONE (DELTASONE) 20 MG tablet   severely edematous turbinates, possible polyps, eustachian tube dysfunction, AOM as well refer back to Dr. Benjamine Mola    No improvement in left ear pain after treatment for several days with prescription antibiotic eardrops, Ciprodex, was previously assessed by telephone visit was not visualized with assistance of a video call or physical exam and  although she did have a provider where she works look at her ear and it was noted to be inflamed at that time.  Her pain has become much more severe.  Today on exam continues to have the appearance of otitis externa but also left acute otitis media, TM is intact, with Ciprodex being ineffective will change to Cortisporin drops and also treat otitis media and otitis externa with Augmentin with higher dose amoxicillin component for weight-based dosing to be effective at inner ear infection.  She was given a shot of steroids and Toradol here in clinic to help with the pain which she states is still extremely severe.  Pregnancy test was done due to history of irregular menses and multiple medication warnings regarding pregnancy, she had negative pregnancy test before any meds were ordered or administered.  Her exam of her sinuses is very abnormal with severe edema, very enlarged nasal mucosa and turbinates with additional possible polyps versus atypical turbinates.  She had previously seen Dr. Benjamine Mola, referred back to ENT for further assessment.   Delsa Grana, PA-C 01/25/19 9:22 AM

## 2019-01-25 NOTE — Patient Instructions (Signed)
Change ear drops to cortisporin for left ear Start antibiotic.  It will be one augmentin pill and one amoxicillin pill in the am, one amxicillin pill at lunch, and augmentin + amoxicillin in evening.   This will get amoxicillin dose to 1250 mg three times a day.  I would take the steroids tomorrow morning 20- 40 mg for 2-3 more days (there will be extra incase you need it to taper)  I put in referral to ENT  Can follow up with [name] if anything does not improve in the next week and you aren't into ENT yet.

## 2019-01-26 ENCOUNTER — Encounter: Payer: Self-pay | Admitting: Family Medicine

## 2019-02-01 ENCOUNTER — Ambulatory Visit (INDEPENDENT_AMBULATORY_CARE_PROVIDER_SITE_OTHER): Payer: No Typology Code available for payment source | Admitting: Otolaryngology

## 2019-02-01 DIAGNOSIS — H6983 Other specified disorders of Eustachian tube, bilateral: Secondary | ICD-10-CM | POA: Diagnosis not present

## 2019-02-01 DIAGNOSIS — R42 Dizziness and giddiness: Secondary | ICD-10-CM

## 2019-02-01 DIAGNOSIS — H9012 Conductive hearing loss, unilateral, left ear, with unrestricted hearing on the contralateral side: Secondary | ICD-10-CM

## 2019-02-01 DIAGNOSIS — J310 Chronic rhinitis: Secondary | ICD-10-CM | POA: Diagnosis not present

## 2019-02-01 DIAGNOSIS — J343 Hypertrophy of nasal turbinates: Secondary | ICD-10-CM

## 2019-02-01 DIAGNOSIS — H6982 Other specified disorders of Eustachian tube, left ear: Secondary | ICD-10-CM | POA: Diagnosis not present

## 2019-02-09 ENCOUNTER — Telehealth: Payer: No Typology Code available for payment source | Admitting: Nurse Practitioner

## 2019-02-09 DIAGNOSIS — R110 Nausea: Secondary | ICD-10-CM

## 2019-02-09 MED ORDER — ONDANSETRON HCL 4 MG PO TABS: 4.0000 mg | ORAL_TABLET | Freq: Three times a day (TID) | ORAL | 0 refills | Status: DC | PRN

## 2019-02-09 NOTE — Progress Notes (Signed)
We are sorry that you are not feeling well. Here is how we plan to help!  Nausea is the feeling that you may vomit. This is a symptom of covid for some people.  I have prescribed a medication that will help alleviate your symptoms and allow you to stay hydrated:  Zofran 4 mg 1 tablet every 8 hours as needed for nausea and vomiting  HOME CARE:  Drink clear liquids.  This is very important! Dehydration (the lack of fluid) can lead to a serious complication.  Start off with 1 tablespoon every 5 minutes for 8 hours.  You may begin eating bland foods after 8 hours without vomiting.  Start with saltine crackers, white bread, rice, mashed potatoes, applesauce.  After 48 hours on a bland diet, you may resume a normal diet.  Try to go to sleep.  Sleep often empties the stomach and relieves the need to vomit.  GET HELP RIGHT AWAY IF:   Your symptoms do not improve or worsen within 2 days after treatment.  You have a fever for over 3 days.  You cannot keep down fluids after trying the medication.  MAKE SURE YOU:   Understand these instructions.  Will watch your condition.  Will get help right away if you are not doing well or get worse.   Thank you for choosing an e-visit. Your e-visit answers were reviewed by a board certified advanced clinical practitioner to complete your personal care plan. Depending upon the condition, your plan could have included both over the counter or prescription medications. Please review your pharmacy choice. Be sure that the pharmacy you have chosen is open so that you can pick up your prescription now.  If there is a problem you may message your provider in [place] to have the prescription routed to another pharmacy. Your safety is important to [name]. If you have drug allergies check your prescription carefully.  For the next 24 hours, you can use MyChart to ask questions about today's visit, request a non-urgent call back, or ask for a work or school excuse  from your e-visit provider. You will get an e-mail in the next two days asking about your experience. I hope that your e-visit has been valuable and will speed your recovery.   5-10 minutes spent reviewing and documenting in chart.

## 2019-02-11 ENCOUNTER — Inpatient Hospital Stay (HOSPITAL_COMMUNITY)
Admission: EM | Admit: 2019-02-11 | Discharge: 2019-02-19 | DRG: 177 | Disposition: A | Discharge status: Home / Self Care | Payer: No Typology Code available for payment source | Attending: Family Medicine | Admitting: Family Medicine

## 2019-02-11 ENCOUNTER — Other Ambulatory Visit: Payer: Self-pay

## 2019-02-11 ENCOUNTER — Inpatient Hospital Stay (HOSPITAL_COMMUNITY): Payer: No Typology Code available for payment source

## 2019-02-11 ENCOUNTER — Emergency Department (HOSPITAL_COMMUNITY): Payer: No Typology Code available for payment source

## 2019-02-11 ENCOUNTER — Encounter (HOSPITAL_COMMUNITY): Payer: Self-pay

## 2019-02-11 DIAGNOSIS — R945 Abnormal results of liver function studies: Secondary | ICD-10-CM | POA: Diagnosis present

## 2019-02-11 DIAGNOSIS — E876 Hypokalemia: Secondary | ICD-10-CM | POA: Diagnosis present

## 2019-02-11 DIAGNOSIS — Z825 Family history of asthma and other chronic lower respiratory diseases: Secondary | ICD-10-CM | POA: Diagnosis not present

## 2019-02-11 DIAGNOSIS — Z7952 Long term (current) use of systemic steroids: Secondary | ICD-10-CM

## 2019-02-11 DIAGNOSIS — Y92239 Unspecified place in hospital as the place of occurrence of the external cause: Secondary | ICD-10-CM | POA: Diagnosis not present

## 2019-02-11 DIAGNOSIS — R7303 Prediabetes: Secondary | ICD-10-CM | POA: Diagnosis present

## 2019-02-11 DIAGNOSIS — J1289 Other viral pneumonia: Secondary | ICD-10-CM | POA: Diagnosis present

## 2019-02-11 DIAGNOSIS — R21 Rash and other nonspecific skin eruption: Secondary | ICD-10-CM | POA: Diagnosis present

## 2019-02-11 DIAGNOSIS — Z79899 Other long term (current) drug therapy: Secondary | ICD-10-CM | POA: Diagnosis not present

## 2019-02-11 DIAGNOSIS — R739 Hyperglycemia, unspecified: Secondary | ICD-10-CM | POA: Diagnosis not present

## 2019-02-11 DIAGNOSIS — U071 COVID-19: Secondary | ICD-10-CM | POA: Diagnosis not present

## 2019-02-11 DIAGNOSIS — Z9049 Acquired absence of other specified parts of digestive tract: Secondary | ICD-10-CM

## 2019-02-11 DIAGNOSIS — E249 Cushing's syndrome, unspecified: Secondary | ICD-10-CM | POA: Diagnosis present

## 2019-02-11 DIAGNOSIS — Z818 Family history of other mental and behavioral disorders: Secondary | ICD-10-CM | POA: Diagnosis not present

## 2019-02-11 DIAGNOSIS — J9601 Acute respiratory failure with hypoxia: Secondary | ICD-10-CM | POA: Diagnosis present

## 2019-02-11 DIAGNOSIS — Z881 Allergy status to other antibiotic agents status: Secondary | ICD-10-CM | POA: Diagnosis not present

## 2019-02-11 DIAGNOSIS — I493 Ventricular premature depolarization: Secondary | ICD-10-CM | POA: Diagnosis present

## 2019-02-11 DIAGNOSIS — J1282 Pneumonia due to coronavirus disease 2019: Secondary | ICD-10-CM

## 2019-02-11 DIAGNOSIS — T380X5A Adverse effect of glucocorticoids and synthetic analogues, initial encounter: Secondary | ICD-10-CM | POA: Diagnosis not present

## 2019-02-11 DIAGNOSIS — Z800 Family history of malignant neoplasm of digestive organs: Secondary | ICD-10-CM | POA: Diagnosis not present

## 2019-02-11 DIAGNOSIS — Z6841 Body Mass Index (BMI) 40.0 and over, adult: Secondary | ICD-10-CM

## 2019-02-11 DIAGNOSIS — Z833 Family history of diabetes mellitus: Secondary | ICD-10-CM

## 2019-02-11 DIAGNOSIS — K760 Fatty (change of) liver, not elsewhere classified: Secondary | ICD-10-CM | POA: Diagnosis present

## 2019-02-11 DIAGNOSIS — I10 Essential (primary) hypertension: Secondary | ICD-10-CM | POA: Diagnosis present

## 2019-02-11 DIAGNOSIS — Z8249 Family history of ischemic heart disease and other diseases of the circulatory system: Secondary | ICD-10-CM

## 2019-02-11 LAB — CBC WITH DIFFERENTIAL/PLATELET
Abs Immature Granulocytes: 0.03 10*3/uL (ref 0.00–0.07)
Basophils Absolute: 0 10*3/uL (ref 0.0–0.1)
Basophils Relative: 0 %
Eosinophils Absolute: 0 10*3/uL (ref 0.0–0.5)
Eosinophils Relative: 0 %
HCT: 44.7 % (ref 36.0–46.0)
Hemoglobin: 14.8 g/dL (ref 12.0–15.0)
Immature Granulocytes: 1 %
Lymphocytes Relative: 21 %
Lymphs Abs: 1.3 10*3/uL (ref 0.7–4.0)
MCH: 30.6 pg (ref 26.0–34.0)
MCHC: 33.1 g/dL (ref 30.0–36.0)
MCV: 92.4 fL (ref 80.0–100.0)
Monocytes Absolute: 0.6 10*3/uL (ref 0.1–1.0)
Monocytes Relative: 9 %
Neutro Abs: 4.3 10*3/uL (ref 1.7–7.7)
Neutrophils Relative %: 69 %
Platelets: 232 10*3/uL (ref 150–400)
RBC: 4.84 MIL/uL (ref 3.87–5.11)
RDW: 13.5 % (ref 11.5–15.5)
WBC: 6.2 10*3/uL (ref 4.0–10.5)
nRBC: 0 % (ref 0.0–0.2)

## 2019-02-11 LAB — POC URINE PREG, ED: Preg Test, Ur: NEGATIVE

## 2019-02-11 LAB — LACTATE DEHYDROGENASE: LDH: 258 U/L — ABNORMAL HIGH (ref 98–192)

## 2019-02-11 LAB — COMPREHENSIVE METABOLIC PANEL
ALT: 88 U/L — ABNORMAL HIGH (ref 0–44)
AST: 100 U/L — ABNORMAL HIGH (ref 15–41)
Albumin: 3.8 g/dL (ref 3.5–5.0)
Alkaline Phosphatase: 84 U/L (ref 38–126)
Anion gap: 11 (ref 5–15)
BUN: 12 mg/dL (ref 6–20)
CO2: 24 mmol/L (ref 22–32)
Calcium: 8.5 mg/dL — ABNORMAL LOW (ref 8.9–10.3)
Chloride: 102 mmol/L (ref 98–111)
Creatinine, Ser: 1.07 mg/dL — ABNORMAL HIGH (ref 0.44–1.00)
GFR calc Af Amer: >60 mL/min (ref >60)
GFR calc non Af Amer: >60 mL/min (ref >60)
Glucose, Bld: 130 mg/dL — ABNORMAL HIGH (ref 70–99)
Potassium: 3.1 mmol/L — ABNORMAL LOW (ref 3.5–5.1)
Sodium: 137 mmol/L (ref 135–145)
Total Bilirubin: 1 mg/dL (ref 0.3–1.2)
Total Protein: 8.1 g/dL (ref 6.5–8.1)

## 2019-02-11 LAB — D-DIMER, QUANTITATIVE: D-Dimer, Quant: <0.27 ug/mL-FEU (ref 0.00–0.50)

## 2019-02-11 LAB — TRIGLYCERIDES: Triglycerides: 66 mg/dL (ref <150)

## 2019-02-11 LAB — CK TOTAL AND CKMB (NOT AT ARMC)
CK, MB: 0.4 ng/mL — ABNORMAL LOW (ref 0.5–5.0)
Relative Index: INVALID (ref 0.0–2.5)
Total CK: 79 U/L (ref 38–234)

## 2019-02-11 LAB — SARS CORONAVIRUS 2 BY RT PCR (HOSPITAL ORDER, PERFORMED IN ~~LOC~~ HOSPITAL LAB): SARS Coronavirus 2: POSITIVE — AB

## 2019-02-11 LAB — LACTIC ACID, PLASMA
Lactic Acid, Venous: 1.1 mmol/L (ref 0.5–1.9)
Lactic Acid, Venous: 1.4 mmol/L (ref 0.5–1.9)

## 2019-02-11 LAB — STREP PNEUMONIAE URINARY ANTIGEN: Strep Pneumo Urinary Antigen: NEGATIVE

## 2019-02-11 LAB — FIBRINOGEN: Fibrinogen: 725 mg/dL — ABNORMAL HIGH (ref 210–475)

## 2019-02-11 LAB — FERRITIN: Ferritin: 483 ng/mL — ABNORMAL HIGH (ref 11–307)

## 2019-02-11 LAB — PROCALCITONIN: Procalcitonin: <0.1 ng/mL

## 2019-02-11 LAB — C-REACTIVE PROTEIN: CRP: 14.2 mg/dL — ABNORMAL HIGH (ref <1.0)

## 2019-02-11 LAB — ABO/RH: ABO/RH(D): A POS

## 2019-02-11 IMAGING — CR PORTABLE CHEST - 1 VIEW
1 series · 1 of 1 positions shown · non-contrast
Comparison: Radiograph [DATE]

CLINICAL DATA: Shortness of breath.  [58] positive.

EXAM:
PORTABLE CHEST 1 VIEW

[portable]
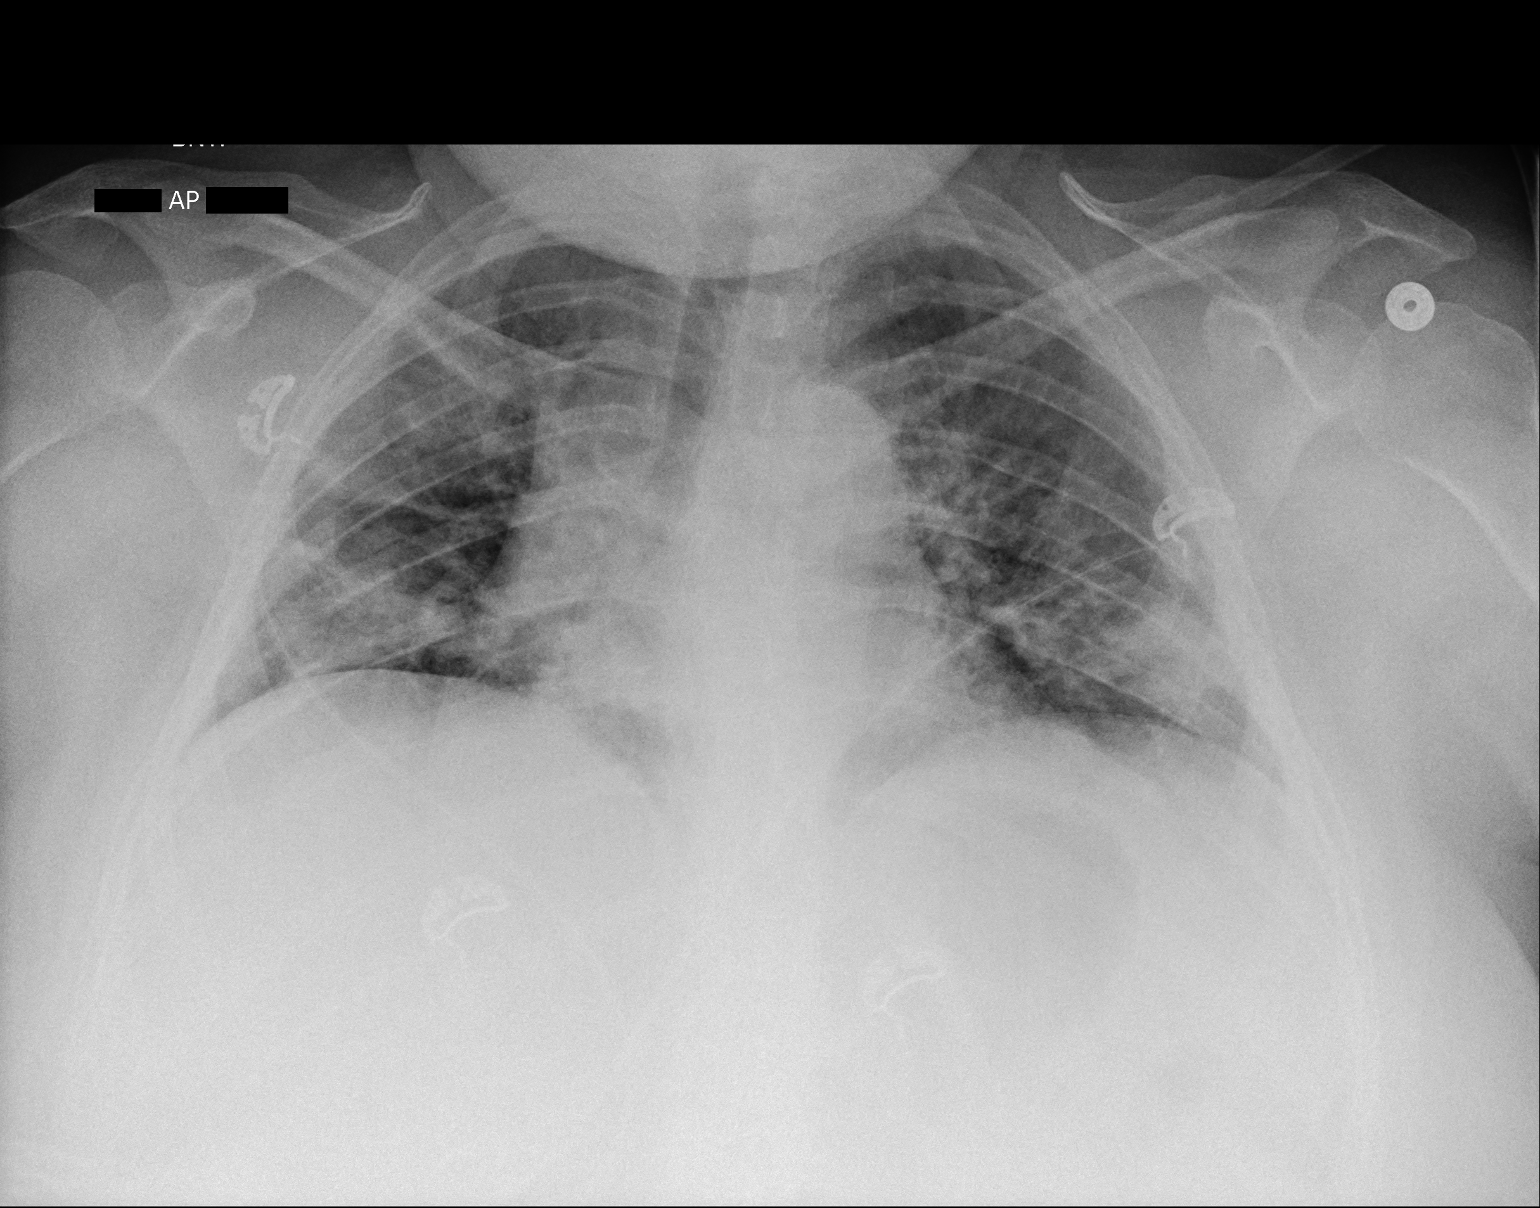

[1 of 1 positions shown; findings below may reference images not displayed]

FINDINGS: Low volumes assessment. Patchy bilateral peripheral predominant lung
opacities. Grossly normal heart size for technique. No large pleural
effusion or pneumothorax.
IMPRESSION: Very low lung volumes with patchy bilateral airspace opacities
consistent with [58] pneumonia.

## 2019-02-11 MED ORDER — ENOXAPARIN SODIUM 60 MG/0.6ML ~~LOC~~ SOLN
60.0000 mg | SUBCUTANEOUS | Status: DC
  Administered 2019-02-12 – 2019-02-19 (×8): 60 mg via SUBCUTANEOUS
  Filled 2019-02-11 (×8): qty 0.6

## 2019-02-11 MED ORDER — OXYCODONE HCL 5 MG PO TABS
5.0000 mg | ORAL_TABLET | ORAL | Status: DC | PRN
  Administered 2019-02-11: 5 mg via ORAL
  Filled 2019-02-11: qty 1

## 2019-02-11 MED ORDER — SODIUM CHLORIDE 0.9% FLUSH
3.0000 mL | Freq: Two times a day (BID) | INTRAVENOUS | Status: DC
  Administered 2019-02-11 – 2019-02-19 (×15): 3 mL via INTRAVENOUS

## 2019-02-11 MED ORDER — SODIUM CHLORIDE 0.9 % IV SOLN
100.0000 mg | INTRAVENOUS | Status: AC
  Administered 2019-02-12 – 2019-02-15 (×4): 100 mg via INTRAVENOUS
  Filled 2019-02-11 (×4): qty 20

## 2019-02-11 MED ORDER — GUAIFENESIN-DM 100-10 MG/5ML PO SYRP
5.0000 mL | ORAL_SOLUTION | ORAL | Status: DC | PRN
  Administered 2019-02-11 – 2019-02-17 (×10): 5 mL via ORAL
  Filled 2019-02-11 (×10): qty 10

## 2019-02-11 MED ORDER — ACETAMINOPHEN 325 MG PO TABS
650.0000 mg | ORAL_TABLET | Freq: Four times a day (QID) | ORAL | Status: DC | PRN
  Administered 2019-02-11 – 2019-02-18 (×5): 650 mg via ORAL
  Filled 2019-02-11 (×5): qty 2

## 2019-02-11 MED ORDER — SODIUM CHLORIDE 0.9 % IV SOLN: 250.0000 mL | INTRAVENOUS | Status: DC | PRN

## 2019-02-11 MED ORDER — POTASSIUM CHLORIDE CRYS ER 20 MEQ PO TBCR
40.0000 meq | EXTENDED_RELEASE_TABLET | Freq: Once | ORAL | Status: AC
  Administered 2019-02-11: 40 meq via ORAL
  Filled 2019-02-11: qty 2

## 2019-02-11 MED ORDER — LEVALBUTEROL TARTRATE 45 MCG/ACT IN AERO
2.0000 | INHALATION_SPRAY | RESPIRATORY_TRACT | Status: DC | PRN
  Filled 2019-02-11: qty 15

## 2019-02-11 MED ORDER — DEXAMETHASONE SODIUM PHOSPHATE 10 MG/ML IJ SOLN
6.0000 mg | INTRAMUSCULAR | Status: DC
  Administered 2019-02-11 – 2019-02-13 (×3): 6 mg via INTRAVENOUS
  Filled 2019-02-11 (×3): qty 1

## 2019-02-11 MED ORDER — SODIUM CHLORIDE 0.9 % IV BOLUS
1000.0000 mL | Freq: Once | INTRAVENOUS | Status: AC
  Administered 2019-02-11: 1000 mL via INTRAVENOUS

## 2019-02-11 MED ORDER — SODIUM CHLORIDE 0.9 % IV SOLN
1.0000 g | INTRAVENOUS | Status: DC
  Administered 2019-02-11: 1 g via INTRAVENOUS
  Filled 2019-02-11: qty 10

## 2019-02-11 MED ORDER — SODIUM CHLORIDE 0.9 % IV SOLN
200.0000 mg | Freq: Once | INTRAVENOUS | Status: AC
  Administered 2019-02-11: 200 mg via INTRAVENOUS
  Filled 2019-02-11: qty 40

## 2019-02-11 MED ORDER — SODIUM CHLORIDE 0.9% FLUSH: 3.0000 mL | INTRAVENOUS | Status: DC | PRN

## 2019-02-11 MED ORDER — ENOXAPARIN SODIUM 60 MG/0.6ML ~~LOC~~ SOLN
0.5000 mg/kg | SUBCUTANEOUS | Status: DC
  Administered 2019-02-11: 60.1 mg via SUBCUTANEOUS
  Filled 2019-02-11: qty 1.2

## 2019-02-11 MED ORDER — ALBUTEROL SULFATE HFA 108 (90 BASE) MCG/ACT IN AERS
2.0000 | INHALATION_SPRAY | RESPIRATORY_TRACT | Status: DC | PRN
  Administered 2019-02-11 – 2019-02-15 (×5): 2 via RESPIRATORY_TRACT
  Filled 2019-02-11: qty 6.7

## 2019-02-11 MED ORDER — TRAMADOL HCL 50 MG PO TABS
50.0000 mg | ORAL_TABLET | Freq: Four times a day (QID) | ORAL | Status: DC | PRN
  Administered 2019-02-13 – 2019-02-17 (×4): 50 mg via ORAL
  Filled 2019-02-11 (×4): qty 1

## 2019-02-11 MED ORDER — ALBUTEROL SULFATE HFA 108 (90 BASE) MCG/ACT IN AERS
2.0000 | INHALATION_SPRAY | RESPIRATORY_TRACT | Status: DC | PRN
  Filled 2019-02-11: qty 6.7

## 2019-02-11 MED ORDER — SODIUM CHLORIDE 0.9 % IV SOLN
500.0000 mg | INTRAVENOUS | Status: DC
  Administered 2019-02-11: 500 mg via INTRAVENOUS
  Filled 2019-02-11: qty 500

## 2019-02-11 NOTE — ED Notes (Signed)
Pt was able to self prone in ED stretcher from bedside commode. Pt's O2 titrated up to 12L high flow O2 with sats increasing. Pt continues to c/o SOB but reports it does feel better now that she is getting more oxygen.

## 2019-02-11 NOTE — Progress Notes (Addendum)
Pt arrived from [facility] via [place] on a NRB. Admissions pager paged by this RN. Awaiting callback/MD.  Dr Thereasa Solo noted to be pt's attending. Messaged MD via Epic chat to notify of pt arrival. ICU CN at bedside to assess pt.

## 2019-02-11 NOTE — Progress Notes (Signed)
Pharmacy Note - Remdesivir Dosing  O:  ALT: 88 CXR: patchy bilateral airspace opacities SpO2: HFNC 12L - 93%   A/P:  Patient meets criteria for remdesivir.  Begin remdesivir 200 mg IV x 1, followed by 100 mg IV daily x 4 days  Monitor ALT, clinical progress  Peggyann Juba, PharmD, [place] [phone] 02/11/2019 1:08 PM

## 2019-02-11 NOTE — ED Notes (Signed)
Patient is resting comfortably. 

## 2019-02-11 NOTE — ED Provider Notes (Signed)
[facility] EMERGENCY DEPARTMENT Provider Note   CSN: [id number] Arrival date & time: 02/11/19  0304     History   Chief Complaint Chief Complaint  Patient presents with  . Shortness of Breath    HPI Paula King is a 38 y.o. female.     Patient with history of Cushing's syndrome and hypertension presenting with 5-day history of "coronavirus symptoms".  States she says shortness of breath, cough, fever, body aches, chills.  She tested positive for corona virus as an outpatient 2 days ago.  She had acutely worsening shortness of breath tonight and tried to use her daughter's inhaler with little relief.  She came in respiratory distress unable to speak full sentences with labored breathing and tachypnea.  Initial O2 sat was 80%. She complains of chest tightness, shortness of breath, body aches, chills.  She is intermittent diarrhea for the past several days and dry heaving but no vomiting.  No leg pain or leg swelling.  Works in the healthcare system.  Denies any exogenous hormone use.  The history is provided by the patient. The history is limited by the condition of the patient.  Shortness of Breath Associated symptoms: chest pain, cough, fever and headaches   Associated symptoms: no abdominal pain, no rash and no vomiting     Past Medical History:  Diagnosis Date  . 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[place])   . Diverticulitis   . Hypertension   . Preeclampsia     Patient Active Problem List   Diagnosis Date Noted  . Pregnant 03/17/2018  . H/O severe pre-eclampsia 03/17/2018  . Previous cesarean section 03/17/2018  . Tachycardia 03/17/2018  . Chronic hypertension during pregnancy, antepartum 03/17/2018  . ADD (attention deficit disorder) 08/22/2017  . Cushings syndrome ([place]) 08/16/2017  . Goiter 07/19/2017  . Morbid obesity ([place]) 07/19/2017  . Diverticulitis of colon 08/09/2014  . Abdominal pain 08/09/2014  . Adrenal adenoma, right 08/09/2014  . Diverticulitis 08/09/2014  .  Elevated transaminase level 08/09/2014    Past Surgical History:  Procedure Laterality Date  . CESAREAN SECTION    . CHOLECYSTECTOMY    . WISDOM TOOTH EXTRACTION       OB History    Gravida  5   Para  1   Term      Preterm  1   AB  3   Living  1     SAB  1   TAB      Ectopic      Multiple      Live Births  1            Home Medications    Prior to Admission medications   Medication Sig Start Date End Date Taking? Authorizing Provider  cetirizine (ZYRTEC) 10 MG tablet Take 10 mg by mouth as needed for allergies.    [provider]  cholecalciferol (VITAMIN D) 1000 units tablet  12/26/17   [provider]  ciprofloxacin-dexamethasone (CIPRODEX) OTIC suspension Place 4 drops into the left ear 2 (two) times daily. 01/23/19   Delsa Grana, PA-C  Cyanocobalamin (B-12) 1000 MCG TABS  12/26/17   [provider]  ondansetron (ZOFRAN) 4 MG tablet Take 1 tablet (4 mg total) by mouth every 8 (eight) hours as needed for nausea or vomiting. 02/09/19   Chevis Pretty, FNP  predniSONE (DELTASONE) 20 MG tablet 2 tabs poqday 1-3, 1 tabs poqday 4-6 01/25/19   Delsa Grana, PA-C    Family History Family History  Problem Relation Age  of Onset  . Hypertension Father   . Diabetes Father   . Anxiety disorder Mother   . Depression Mother   . Cancer Sister        throat   . Hypertension Paternal Grandmother   . Hypertension Maternal Grandmother   . Asthma Daughter     Social History Social History   Tobacco Use  . Smoking status: Never Smoker  . Smokeless tobacco: Never Used  Substance Use Topics  . Alcohol use: No  . Drug use: No     Allergies   Ciprofloxacin   Review of Systems Review of Systems  Constitutional: Positive for activity change, appetite change, chills, fatigue and fever.  HENT: Positive for congestion.   Respiratory: Positive for cough, chest tightness and shortness of breath.   Cardiovascular: Positive for chest  pain.  Gastrointestinal: Positive for diarrhea and nausea. Negative for abdominal pain and vomiting.  Genitourinary: Negative for dysuria.  Musculoskeletal: Positive for arthralgias and myalgias.  Skin: Negative for rash.  Neurological: Positive for weakness and headaches.    all other systems are negative except as noted in the HPI and PMH.    Physical Exam Updated Vital Signs BP 110/73   Pulse (!) 111   Temp 99.7 F (37.6 C) (Oral)   Resp (!) 26   Wt 120.2 kg   LMP  (LMP Unknown)   SpO2 95%   Breastfeeding No   BMI 45.49 kg/m   Physical Exam Vitals signs and nursing note reviewed.  Constitutional:      General: She is in acute distress.     Appearance: She is well-developed. She is obese. She is ill-appearing.     Comments: Obese, moderate respiratory distress, speaking short phrases  Hoarse voice  HENT:     Head: Normocephalic and atraumatic.     Mouth/Throat:     Mouth: Mucous membranes are moist.     Pharynx: No oropharyngeal exudate or posterior oropharyngeal erythema.  Eyes:     Conjunctiva/sclera: Conjunctivae normal.     Pupils: Pupils are equal, round, and reactive to light.  Neck:     Musculoskeletal: Normal range of motion and neck supple.     Comments: No meningismus. Cardiovascular:     Rate and Rhythm: Normal rate and regular rhythm.     Heart sounds: Normal heart sounds. No murmur.  Pulmonary:     Effort: Pulmonary effort is normal. No respiratory distress.     Breath sounds: Normal breath sounds. No wheezing.     Comments: Diminished breath sounds bilaterally Exam limited by body habitus Chest:     Chest wall: No tenderness.  Abdominal:     Palpations: Abdomen is soft.     Tenderness: There is no abdominal tenderness. There is no guarding or rebound.  Musculoskeletal: Normal range of motion.        General: No tenderness.  Skin:    General: Skin is warm.     Capillary Refill: Capillary refill takes less than 2 seconds.  Neurological:      General: No focal deficit present.     Mental Status: She is alert and oriented to person, place, and time. Mental status is at baseline.     Cranial Nerves: No cranial nerve deficit.     Motor: No abnormal muscle tone.     Coordination: Coordination normal.     Comments:  5/5 strength throughout. CN 2-12 intact.Equal grip strength.   Psychiatric:        Behavior: Behavior  normal.      ED Treatments / Results  Labs (all labs ordered are listed, but only abnormal results are displayed) Labs Reviewed  COMPREHENSIVE METABOLIC PANEL - Abnormal; Notable for the following components:      Result Value   Potassium 3.1 (*)    Glucose, Bld 130 (*)    Creatinine, Ser 1.07 (*)    Calcium 8.5 (*)    AST 100 (*)    ALT 88 (*)    All other components within normal limits  LACTATE DEHYDROGENASE - Abnormal; Notable for the following components:   LDH 258 (*)    All other components within normal limits  FIBRINOGEN - Abnormal; Notable for the following components:   Fibrinogen 725 (*)    All other components within normal limits  SARS CORONAVIRUS 2 (HOSPITAL ORDER, [place] [facility])  CULTURE, BLOOD (ROUTINE X 2)  CULTURE, BLOOD (ROUTINE X 2)  CBC WITH DIFFERENTIAL/PLATELET  D-DIMER, QUANTITATIVE (NOT AT [facility])  TRIGLYCERIDES  LACTIC ACID, PLASMA  LACTIC ACID, PLASMA  PROCALCITONIN  FERRITIN  C-REACTIVE PROTEIN  POC URINE PREG, ED  I-STAT BETA HCG BLOOD, ED ([facility], [facility], [facility] ONLY)    EKG EKG Interpretation  Date/Time:  Sunday February 11 2019 03:07:33 EDT Ventricular Rate:  105 PR Interval:    QRS Duration: 90 QT Interval:  330 QTC Calculation: 437 R Axis:   -8 Text Interpretation:  Sinus tachycardia Consider anterior infarct No significant change was found Confirmed by Ezequiel Essex [id number]) on 02/11/2019 3:23:33 AM   Radiology Dg Chest Port 1 View  Result Date: 02/11/2019 CLINICAL DATA:  Shortness of breath.  COVID-19 positive. EXAM: PORTABLE CHEST 1 VIEW  COMPARISON:  Radiograph 02/03/2015 FINDINGS: Low volumes assessment. Patchy bilateral peripheral predominant lung opacities. Grossly normal heart size for technique. No large pleural effusion or pneumothorax. IMPRESSION: Very low lung volumes with patchy bilateral airspace opacities consistent with COVID-19 pneumonia. Electronically Signed   By: Keith Rake M.D.   On: 02/11/2019 03:51    Procedures Procedures (including critical care time)  Medications Ordered in ED Medications - No data to display   Initial Impression / Assessment and Plan / ED Course  I have reviewed the triage vital signs and the nursing notes.  Pertinent labs & imaging results that were available during my care of the patient were reviewed by me and considered in my medical decision making (see chart for details).       Patient with positive coronavirus test presenting with a 5-day history of progressively worsening shortness of breath with hypoxia on arrival.  O2 saturation on 2 L is 95%.  Labs will be obtained, gentle hydration, chest x-ray.  Chest x-ray shows multifocal infiltrates with shallow lung volumes.  Patient mildly tachypneic with no hypoxia.  She will need admission given her oxygen requirements and persistent tachypnea.  Discussed with Dr. Maudie Mercury who will arrange admission to [facility].   CRITICAL CARE Performed by: Ezequiel Essex Total critical care time: 35 minutes Critical care time was exclusive of separately billable procedures and treating other patients. Critical care was necessary to treat or prevent imminent or life-threatening deterioration. Critical care was time spent personally by me on the following activities: development of treatment plan with patient and/or surrogate as well as nursing, discussions with consultants, evaluation of patient's response to treatment, examination of patient, obtaining history from patient or surrogate, ordering and performing treatments and  interventions, ordering and review of laboratory studies, ordering and review of  radiographic studies, pulse oximetry and re-evaluation of patient's condition.   Paula King was evaluated in Emergency Department on 02/11/2019 for the symptoms described in the history of present illness. She was evaluated in the context of the global COVID-19 pandemic, which necessitated consideration that the patient might be at risk for infection with the SARS-CoV-2 virus that causes COVID-19. Institutional protocols and algorithms that pertain to the evaluation of patients at risk for COVID-19 are in a state of rapid change based on information released by regulatory bodies including the CDC and federal and state organizations. These policies and algorithms were followed during the patient's care in the ED.   Final Clinical Impressions(s) / ED Diagnoses   Final diagnoses:  Pneumonia due to COVID-19 virus    ED Discharge Orders    None       Danicia Terhaar, Annie Main, MD 02/11/19 [phone]

## 2019-02-11 NOTE — H&P (Signed)
TRH H&P    Patient Demographics:    Paula King, is a 38 y.o. female  MRN: [id number]  DOB - [birth year]  Admit Date - 02/11/2019  Referring MD/NP/PA:  [place]our  Outpatient Primary MD for the patient is [facility], [place] Nunnery, MD  Patient coming from:  home  Chief complaint-  dyspnea   HPI:    Paula King  is a 38 y.o. female, w obeisity, cushings, apparently presents with c/o dyspnea and fever for the past 3 days.  Pt notes dry cough.  Pt states tested positive for covid-19 2 days ago .  Pt notes alteration in taste, and some loose stool.   In ED,   CXR IMPRESSION: Very low lung volumes with patchy bilateral airspace opacities consistent with COVID-19 pneumonia.  Wbc 6.2, Hgb 14.8, Plt 232 Na 137, K 3.1,  Glucose 130 Bun 12, Creatinine 1.07 Ast 100, Alt 88, Alk phos 84, T. Bili 1.0 D dimer <0.27 Procalcitonin <0.10 LDH 258 Ferritin 483 Tg 66,  Fibrinogen 725 Crp 14.2  Lactic acid, blood culture x2 pending  Pt will be admitted for Covid -19 positive, acute respiratory failure with hypoxia. And hypokalemia    Review of systems:    In addition to the HPI above,    No Headache, No changes with Vision or hearing, No problems swallowing food or Liquids, No Chest pain,  No Abdominal pain, No Nausea or Vomiting,No Blood in stool or Urine, No dysuria, No new skin rashes or bruises, No new joints pains-aches,  No new weakness, tingling, numbness in any extremity, No recent weight gain or loss, No polyuria, polydypsia or polyphagia, No significant Mental Stressors.  All other systems reviewed and are negative.    Past History of the following :    Past Medical History:  Diagnosis Date  . 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[place])   . Diverticulitis   . Hypertension   . Preeclampsia       Past Surgical History:  Procedure Laterality Date  . CESAREAN SECTION    . CHOLECYSTECTOMY    . WISDOM  TOOTH EXTRACTION        Social History:      Social History   Tobacco Use  . Smoking status: Never Smoker  . Smokeless tobacco: Never Used  Substance Use Topics  . Alcohol use: No       Family History :     Family History  Problem Relation Age of Onset  . Hypertension Father   . Diabetes Father   . Anxiety disorder Mother   . Depression Mother   . Cancer Sister        throat   . Hypertension Paternal Grandmother   . Hypertension Maternal Grandmother   . Asthma Daughter        Home Medications:   Prior to Admission medications   Medication Sig Start Date End Date Taking? Authorizing Provider  cetirizine (ZYRTEC) 10 MG tablet Take 10 mg by mouth as needed for allergies.    [provider]  cholecalciferol (VITAMIN D) 1000 units tablet  12/26/17   [provider]  ciprofloxacin-dexamethasone (CIPRODEX) OTIC suspension Place 4 drops into the left ear 2 (two) times daily. 01/23/19   Delsa Grana, PA-C  Cyanocobalamin (B-12) 1000 MCG TABS  12/26/17   [provider]  ondansetron (ZOFRAN) 4 MG tablet Take 1 tablet (4 mg total) by mouth every 8 (eight) hours as needed for nausea or vomiting. 02/09/19   Chevis Pretty, FNP  predniSONE (DELTASONE) 20 MG tablet 2 tabs poqday 1-3, 1 tabs poqday 4-6 01/25/19   Delsa Grana, PA-C     Allergies:     Allergies  Allergen Reactions  . Ciprofloxacin     IV Only-Patient started sweating and becoming clammy when was infused with IV cipro on 08/09/14. Can tolerate PO Ciprofloxacin      Physical Exam:   Vitals  Blood pressure 110/73, pulse 95, temperature 99.7 F (37.6 C), temperature source Oral, resp. rate (!) 24, weight 120.2 kg, SpO2 91 %, not currently breastfeeding.  1.  General: axoxo3  2. Psychiatric: euthymic  3. Neurologic: cn2-12 intact, reflexes 2+ symmetric, diffuse with no clonus, motor 5/5 in all 4 ext  4. HEENMT:  Anicteric, pupils 1.37m symmetric, direct, consensual, near  intact Neck no jvd, no bruit  5. Respiratory : Slight crackles right lung base, no wheezing  6. Cardiovascular : rrr s1, s2, no m/g/r  7. Gastrointestinal:  Abd: soft, obese, nt, nd, +bs  8. Skin:  Ext: no c/c/e, no rash  9.Musculoskeletal:  Good ROM,  No adenopathy    Data Review:    CBC Recent Labs  Lab 02/11/19 0307  WBC 6.2  HGB 14.8  HCT 44.7  PLT 232  MCV 92.4  MCH 30.6  MCHC 33.1  RDW 13.5  LYMPHSABS 1.3  MONOABS 0.6  EOSABS 0.0  BASOSABS 0.0   ------------------------------------------------------------------------------------------------------------------  Results for orders placed or performed during the hospital encounter of 02/11/19 (from the past 48 hour(s))  CBC WITH DIFFERENTIAL     Status: None   Collection Time: 02/11/19  3:07 AM  Result Value Ref Range   WBC 6.2 4.0 - 10.5 K/uL   RBC 4.84 3.87 - 5.11 MIL/uL   Hemoglobin 14.8 12.0 - 15.0 g/dL   HCT 44.7 36.0 - 46.0 %   MCV 92.4 80.0 - 100.0 fL   MCH 30.6 26.0 - 34.0 pg   MCHC 33.1 30.0 - 36.0 g/dL   RDW 13.5 11.5 - 15.5 %   Platelets 232 150 - 400 K/uL   nRBC 0.0 0.0 - 0.2 %   Neutrophils Relative % 69 %   Neutro Abs 4.3 1.7 - 7.7 K/uL   Lymphocytes Relative 21 %   Lymphs Abs 1.3 0.7 - 4.0 K/uL   Monocytes Relative 9 %   Monocytes Absolute 0.6 0.1 - 1.0 K/uL   Eosinophils Relative 0 %   Eosinophils Absolute 0.0 0.0 - 0.5 K/uL   Basophils Relative 0 %   Basophils Absolute 0.0 0.0 - 0.1 K/uL   Immature Granulocytes 1 %   Abs Immature Granulocytes 0.03 0.00 - 0.07 K/uL    Comment: Performed at [facility] [address], [place] [address] Comprehensive metabolic panel     Status: Abnormal   Collection Time: 02/11/19  3:07 AM  Result Value Ref Range   Sodium 137 135 - 145 mmol/L   Potassium 3.1 (L) 3.5 - 5.1 mmol/L   Chloride 102 98 - 111 mmol/L   CO2 24 22 - 32 mmol/L   Glucose,  Bld 130 (H) 70 - 99 mg/dL   BUN 12 6 - 20 mg/dL   Creatinine, Ser 1.07 (H) 0.44 - 1.00  mg/dL   Calcium 8.5 (L) 8.9 - 10.3 mg/dL   Total Protein 8.1 6.5 - 8.1 g/dL   Albumin 3.8 3.5 - 5.0 g/dL   AST 100 (H) 15 - 41 U/L   ALT 88 (H) 0 - 44 U/L   Alkaline Phosphatase 84 38 - 126 U/L   Total Bilirubin 1.0 0.3 - 1.2 mg/dL   GFR calc non Af Amer >60 >60 mL/min   GFR calc Af Amer >60 >60 mL/min   Anion gap 11 5 - 15    Comment: Performed at [facility], [address] [place], [place] [address]  D-dimer, quantitative     Status: None   Collection Time: 02/11/19  3:07 AM  Result Value Ref Range   D-Dimer, Quant <0.27 0.00 - 0.50 ug/mL-FEU    Comment: (NOTE) At the manufacturer cut-off of 0.50 ug/mL FEU, this assay has been documented to exclude PE with a sensitivity and negative predictive value of 97 to 99%.  At this time, this assay has not been approved by the FDA to exclude DVT/VTE. Results should be correlated with clinical presentation. Performed at [facility], [address] [place], [place] [address]   Procalcitonin     Status: None   Collection Time: 02/11/19  3:07 AM  Result Value Ref Range   Procalcitonin <0.10 ng/mL    Comment:        Interpretation: PCT (Procalcitonin) <= 0.5 ng/mL: Systemic infection (sepsis) is not likely. Local bacterial infection is possible. (NOTE)       Sepsis PCT Algorithm           Lower Respiratory Tract                                      Infection PCT Algorithm    ----------------------------     ----------------------------         PCT < 0.25 ng/mL                PCT < 0.10 ng/mL         Strongly encourage             Strongly discourage   discontinuation of antibiotics    initiation of antibiotics    ----------------------------     -----------------------------       PCT 0.25 - 0.50 ng/mL            PCT 0.10 - 0.25 ng/mL               OR       >80% decrease in PCT            Discourage initiation of                                            antibiotics      Encourage discontinuation           of antibiotics     ----------------------------     -----------------------------         PCT >= 0.50 ng/mL              PCT 0.26 - 0.50 ng/mL  AND        <80% decrease in PCT             Encourage initiation of                                             antibiotics       Encourage continuation           of antibiotics    ----------------------------     -----------------------------        PCT >= 0.50 ng/mL                  PCT > 0.50 ng/mL               AND         increase in PCT                  Strongly encourage                                      initiation of antibiotics    Strongly encourage escalation           of antibiotics                                     -----------------------------                                           PCT <= 0.25 ng/mL                                                 OR                                        > 80% decrease in PCT                                     Discontinue / Do not initiate                                             antibiotics Performed at [facility], [address], [place], [place] [address]   Lactate dehydrogenase     Status: Abnormal   Collection Time: 02/11/19  3:07 AM  Result Value Ref Range   LDH 258 (H) 98 - 192 U/L    Comment: Performed at [facility], [address], [place], [place] [address]  Ferritin     Status: Abnormal   Collection Time: 02/11/19  3:07 AM  Result Value Ref Range   Ferritin 483 (H) 11 - 307 ng/mL    Comment: Performed at [facility], [address], [place], [place]  [address]  Triglycerides     Status: None   Collection Time: 02/11/19  3:07 AM  Result Value Ref Range   Triglycerides 66 <150 mg/dL    Comment: Performed at [facility], [address], [place], [place] [address]  Fibrinogen     Status: Abnormal   Collection Time: 02/11/19  3:07 AM  Result Value Ref Range   Fibrinogen 725 (H) 210 - 475 mg/dL    Comment: Performed at [facility], [address], [place], [place] [address]   C-reactive protein     Status: Abnormal   Collection Time: 02/11/19  3:07 AM  Result Value Ref Range   CRP 14.2 (H) <1.0 mg/dL    Comment: Performed at [facility], [address], [place], [place] [address]  SARS Coronavirus 2 [facility] order, Performed in [facility] hospital lab) Nasopharyngeal Nasopharyngeal Swab     Status: Abnormal   Collection Time: 02/11/19  3:47 AM   Specimen: Nasopharyngeal Swab  Result Value Ref Range   SARS Coronavirus 2 POSITIVE (A) NEGATIVE    Comment: CRITICAL RESULT CALLED TO, READ BACK BY AND VERIFIED WITH: DOSS,M @ 0448 ON 02/11/19 BY JUW Performed at [facility], [address] [place],  [address]     Chemistries  Recent Labs  Lab 02/11/19 0307  NA 137  K 3.1*  CL 102  CO2 24  GLUCOSE 130*  BUN 12  CREATININE 1.07*  CALCIUM 8.5*  AST 100*  ALT 88*  ALKPHOS 84  BILITOT 1.0   ------------------------------------------------------------------------------------------------------------------  ------------------------------------------------------------------------------------------------------------------ GFR: Estimated Creatinine Clearance: 91 mL/min (A) (by C-G formula based on SCr of 1.07 mg/dL (H)). Liver Function Tests: Recent Labs  Lab 02/11/19 0307  AST 100*  ALT 88*  ALKPHOS 84  BILITOT 1.0  PROT 8.1  ALBUMIN 3.8   No results for input(s): LIPASE, AMYLASE in the last 168 hours. No results for input(s): AMMONIA in the last 168 hours. Coagulation Profile: No results for input(s): INR, PROTIME in the last 168 hours. Cardiac Enzymes: No results for input(s): CKTOTAL, CKMB, CKMBINDEX, TROPONINI in the last 168 hours. BNP (last 3 results) No results for input(s): PROBNP in the last 8760 hours. HbA1C: No results for input(s): HGBA1C in the last 72 hours. CBG: No results for input(s): GLUCAP in the last 168 hours. Lipid Profile: Recent Labs    02/11/19 0307  TRIG 66   Thyroid Function Tests: No results for  input(s): TSH, T4TOTAL, FREET4, T3FREE, THYROIDAB in the last 72 hours. Anemia Panel: Recent Labs    02/11/19 0307  FERRITIN 483*    --------------------------------------------------------------------------------------------------------------- Urine analysis:    Component Value Date/Time   COLORURINE YELLOW 08/09/2014 1405   APPEARANCEUR CLOUDY (A) 08/09/2014 1405   LABSPEC 1.025 04/18/2017 0939   PHURINE 5.0 04/18/2017 0939   GLUCOSEU NEGATIVE 04/18/2017 0939   HGBUR NEGATIVE 04/18/2017 0939   BILIRUBINUR Negative 08/21/2017 [place] 04/18/2017 0939   PROTEINUR Negative 08/21/2017 1227   PROTEINUR NEGATIVE 04/18/2017 0939   UROBILINOGEN 0.2 08/21/2017 1227   UROBILINOGEN 0.2 04/18/2017 0939   NITRITE Negative 08/21/2017 1227   NITRITE NEGATIVE 04/18/2017 0939   LEUKOCYTESUR Negative 08/21/2017 1227      Imaging Results:    Dg Chest Port 1 View  Result Date: 02/11/2019 CLINICAL DATA:  Shortness of breath.  COVID-19 positive. EXAM: PORTABLE CHEST 1 VIEW COMPARISON:  Radiograph 02/03/2015 FINDINGS: Low volumes assessment. Patchy bilateral peripheral predominant lung opacities. Grossly normal heart size for technique. No large pleural effusion  or pneumothorax. IMPRESSION: Very low lung volumes with patchy bilateral airspace opacities consistent with COVID-19 pneumonia. Electronically Signed   By: Keith Rake M.D.   On: 02/11/2019 03:51      Assessment & Plan:    Principal Problem:   COVID-19 virus infection Active Problems:   Acute respiratory failure with hypoxia (HCC)   Hypokalemia   Abnormal liver function  Acute respiratory failure w hypoxia Secondary to Covid -19 infection vs CAP Levalbuterol HFA 2puff q4h prn  Prone patient O2 [place]   Covid- 19 infection Dexamethasone 71m iv qday Remdesivir pharmacy to dose  CAP Blood culture x2 Urine strep antigen, urine legionella antigen Rocephin 1gm iv qday, zithromax 5027miv qday  Abnormal  liver function Check cpk, mb Check acute hepatitis panel Check RUQ ultrasound  Hypokalemia Replete Check cmp in am  Tachycardia secondary to hypoxia Tele Monitor  DVT Prophylaxis-   Lovenox - SCDs   AM Labs Ordered, also please review Full Orders  Family Communication: Admission, patients condition and plan of care including tests being ordered have been discussed with the patient  who indicate understanding and agree with the plan and Code Status.  Code Status: FULL CODE, d/w [facility]: Observation/Inpatient: Based on patients clinical presentation and evaluation of above clinical data, I have made determination that patient meets Inpatient criteria at this time. Pt will require iv remdesivir as well as iv abx,  Pt has high risk of clinical deterioration.  Pt will require > 2nites stay and inpatient status.   Time spent in minutes : 70   JaJani Gravel.D on 02/11/2019 at 5:01 AM

## 2019-02-11 NOTE — ED Notes (Signed)
CRITICAL VALUE ALERT  Critical Value:  COVID19 Positive  Date & Time Notied:  02/11/2019 0448  Provider Notified: Dr. Evans Lance  Orders Received/Actions taken: see chart

## 2019-02-11 NOTE — ED Notes (Addendum)
Pt c/o progressing SOB, cough, dizziness. Pt not able to complete full sentences but can talk in short phrases. Pt is on 9L high flow O2 [place] with sats 92%. Dr. Malachi Carl notified of pt's change in condition. Order given to get her transport prioritized, continue to titrate high flow O2 to pt's needs and place pt in prone position.

## 2019-02-11 NOTE — ED Notes (Signed)
RT called

## 2019-02-11 NOTE — ED Triage Notes (Signed)
Pt c/o shortness of breath that started Monday. Pt tested positive for Covid-19 on Friday. Pt has N-95 mask on upon arrival. Pt was also given face shield per request. Pt says breathing has progressively gotten worse and tonight she used her daughters albuterol inhaler with little relief, so she came in to be seen. Pt unable to speak full sentences upon arrival, labored breathing, tachypnea, with O2 sats between 80-85% on room air. Pt placed on 4 L O2 via [place], oxygen sats came up within 1-2 minutes to 94%.

## 2019-02-11 NOTE — Progress Notes (Signed)
   chart reviewed, please see EMR for updated orders. Please see full H&P dictated by admitting physician Dr Maudie Mercury for same date of service.   Patient admitted 02/11/2019 with acute hypoxic respiratory failure in the setting of COVID-19 pneumonia  --Awaiting transfer to [place]  --Elevated LFTs noted (AST 100, ALT 88)--acute viral hepatitis profile pending,  --abdominal ultrasound ordered by admitting provider --Given initiation of Remdesivir which can further lead to LFT elevation need to monitor LFTs closely  ---  Repeat physical exam not performed due to COVID-19 limitations as patient was recently evaluated by the admitting provider and no significant new changes in her condition per report  -Disposition-  --Awaiting transfer to [place], [place]

## 2019-02-11 NOTE — ED Notes (Signed)
Followed up with [facility] unable to give an ETA at this time when patient will be transported.

## 2019-02-11 NOTE — ED Notes (Signed)
Spoke with [facility] per hospitalist patient needs to be made a priority transport due to change in patient status. [facility] notified stating "will let [phone] know."

## 2019-02-11 NOTE — Progress Notes (Signed)
Paula King - Stepdown/ICU TEAM  Paula King  FYB:[id number] DOB: Jun 22, 1981 DOA: 02/11/2019 PCP: Alycia Rossetti, MD    Brief Narrative:  38 year old with a history of Cushing's disease and morbid obesity who presented to the [facility] ED with complaints of shortness of breath and fever for 3 days.  She tested positive for SARS-CoV-2 2 days prior to her presentation.  Review of systems was also remarkable for loss of taste and some loose stool.  In the emergency room she was noted to have patchy bilateral airspace opacities consistent with COVID pneumonia, and she suffered with hypoxia requiring supplemental oxygen.  Significant Events: 8/16 admit to [facility] via [facility]  COVID-19 specific Treatment: Remdesivir 8/16 > Decadron 8/16 >  Subjective: Follow up note.   Assessment & Plan:  COVID pneumonia -acute hypoxic respiratory failure  Recent Labs  Lab 02/11/19 0307  DDIMER <0.27  FERRITIN 483*  CRP 14.2*  ALT 88*  PROCALCITON <0.10    Transaminitis  Recent Labs  Lab 02/11/19 0307  AST 100*  ALT 88*  ALKPHOS 84  BILITOT King.0  PROT 8.King  ALBUMIN 3.8    Hypokalemia  Cushing's  Morbid obesity - Estimated body mass index is 45.49 kg/m as calculated from the following:   Height as of 01/25/19: 5\' 4"  (King.626 m).   Weight as of this encounter: 120.2 kg.   DVT prophylaxis: Lovenox Code Status: FULL CODE Family Communication:  Disposition Plan:   Consultants:  none  Antimicrobials:  Azithromycin 8/16 Ceftriaxone 8/15  Objective: Blood pressure 106/67, pulse (!) 101, temperature 99.7 F (37.6 C), temperature source Oral, resp. rate 11, weight 120.2 kg, SpO2 95 %, not currently breastfeeding.  Intake/Output Summary (Last 24 hours) at 02/11/2019 1030 Last data filed at 02/11/2019 0835 Gross per 24 hour  Intake 1100 ml  Output -  Net 1100 ml   Filed Weights   02/11/19 0327  Weight: 120.2 kg    Examination: Pt examined by admitting MD and in F/U by  Dr. Denton Brick.   CBC: Recent Labs  Lab 02/11/19 0307  WBC 6.2  NEUTROABS 4.3  HGB 14.8  HCT 44.7  MCV 92.4  PLT 527   Basic Metabolic Panel: Recent Labs  Lab 02/11/19 0307  NA 137  K 3.King*  CL 102  CO2 24  GLUCOSE 130*  BUN 12  CREATININE King.07*  CALCIUM 8.5*   GFR: Estimated Creatinine Clearance: 91 mL/min (A) (by C-G formula based on SCr of King.07 mg/dL (H)).  Liver Function Tests: Recent Labs  Lab 02/11/19 0307  AST 100*  ALT 88*  ALKPHOS 84  BILITOT King.0  PROT 8.King  ALBUMIN 3.8    HbA1C: Hgb A1c MFr Bld  Date/Time Value Ref Range Status  12/13/2018 10:47 AM 5.6 4.8 - 5.6 % Final    Comment:             Prediabetes: 5.7 - 6.4          Diabetes: >6.4          Glycemic control for adults with diabetes: <7.0   11/18/2017 02:33 PM 5.4 <5.7 % of total Hgb Final    Comment:    For the purpose of screening for the presence of diabetes: . <5.7%       Consistent with the absence of diabetes 5.7-6.4%    Consistent with increased risk for diabetes             (prediabetes) > or =6.5%  Consistent  with diabetes . This assay result is consistent with a decreased risk of diabetes. . Currently, no consensus exists regarding use of hemoglobin A1c for diagnosis of diabetes in children. . According to American Diabetes Association (ADA) guidelines, hemoglobin A1c <7.0% represents optimal control in non-pregnant diabetic patients. Different metrics may apply to specific patient populations.  Standards of Medical Care in Diabetes(ADA). Marland Kitchen      Recent Results (from the past 240 hour(s))  SARS Coronavirus 2 [facility] order, Performed in [facility] hospital lab) Nasopharyngeal Nasopharyngeal Swab     Status: Abnormal   Collection Time: 02/11/19  3:47 AM   Specimen: Nasopharyngeal Swab  Result Value Ref Range Status   SARS Coronavirus 2 POSITIVE (A) NEGATIVE Final    Comment: CRITICAL RESULT CALLED TO, READ BACK BY AND VERIFIED WITH: DOSS,M @ 0448 ON 02/11/19 BY  JUW Performed at [facility], [address] [place], [place] [address]   Blood Culture (routine x 2)     Status: None (Preliminary result)   Collection Time: 02/11/19  5:25 AM   Specimen: Left Antecubital; Blood  Result Value Ref Range Status   Specimen Description   Final    LEFT ANTECUBITAL BOTTLES DRAWN AEROBIC AND ANAEROBIC   Special Requests   Final    Blood Culture adequate volume Performed at [facility], [address] [place], [place] [address]    Culture PENDING  Incomplete   Report Status PENDING  Incomplete  Blood Culture (routine x 2)     Status: None (Preliminary result)   Collection Time: 02/11/19  5:25 AM   Specimen: BLOOD LEFT HAND  Result Value Ref Range Status   Specimen Description BLOOD LEFT HAND BOTTLES DRAWN AEROBIC ONLY  Final   Special Requests   Final    Blood Culture adequate volume Performed at [facility], [address], [place], [place] [address]    Culture PENDING  Incomplete   Report Status PENDING  Incomplete     Scheduled Meds: . [START ON 02/12/2019] enoxaparin (LOVENOX) injection  60 mg Subcutaneous Q24H  . sodium chloride flush  3 mL Intravenous Q12H   Continuous Infusions: . sodium chloride    . azithromycin 500 mg (02/11/19 0160)  . cefTRIAXone (ROCEPHIN)  IV Stopped (02/11/19 0723)     LOS: 0 days   Cherene Altes, MD [facility] Office  [phone] Pager - Text Page per Amion  If 7PM-7AM, please contact night-coverage per Amion 02/11/2019, 10:30 AM

## 2019-02-11 NOTE — Progress Notes (Signed)
RT to bedside  With ICU CN to assess pt due to report of pt possibly needing ICU/intubation. Pt self proning on 12 L HFNC with spo2 >90%. Pt in no distress, no increased WOB. Pt encouraged to prone as often as tolerated. Rt will continue to monitor closely for increased O2 needs

## 2019-02-12 ENCOUNTER — Inpatient Hospital Stay (HOSPITAL_COMMUNITY): Payer: No Typology Code available for payment source

## 2019-02-12 LAB — CBC WITH DIFFERENTIAL/PLATELET
Abs Immature Granulocytes: 0.04 10*3/uL (ref 0.00–0.07)
Basophils Absolute: 0 10*3/uL (ref 0.0–0.1)
Basophils Relative: 0 %
Eosinophils Absolute: 0 10*3/uL (ref 0.0–0.5)
Eosinophils Relative: 0 %
HCT: 41.2 % (ref 36.0–46.0)
Hemoglobin: 13.3 g/dL (ref 12.0–15.0)
Immature Granulocytes: 1 %
Lymphocytes Relative: 15 %
Lymphs Abs: 1.2 10*3/uL (ref 0.7–4.0)
MCH: 30.6 pg (ref 26.0–34.0)
MCHC: 32.3 g/dL (ref 30.0–36.0)
MCV: 94.7 fL (ref 80.0–100.0)
Monocytes Absolute: 0.5 10*3/uL (ref 0.1–1.0)
Monocytes Relative: 7 %
Neutro Abs: 6 10*3/uL (ref 1.7–7.7)
Neutrophils Relative %: 77 %
Platelets: 261 10*3/uL (ref 150–400)
RBC: 4.35 MIL/uL (ref 3.87–5.11)
RDW: 13.6 % (ref 11.5–15.5)
WBC: 7.7 10*3/uL (ref 4.0–10.5)
nRBC: 0 % (ref 0.0–0.2)

## 2019-02-12 LAB — COMPREHENSIVE METABOLIC PANEL
ALT: 75 U/L — ABNORMAL HIGH (ref 0–44)
AST: 77 U/L — ABNORMAL HIGH (ref 15–41)
Albumin: 3.3 g/dL — ABNORMAL LOW (ref 3.5–5.0)
Alkaline Phosphatase: 73 U/L (ref 38–126)
Anion gap: 12 (ref 5–15)
BUN: 14 mg/dL (ref 6–20)
CO2: 26 mmol/L (ref 22–32)
Calcium: 8.4 mg/dL — ABNORMAL LOW (ref 8.9–10.3)
Chloride: 104 mmol/L (ref 98–111)
Creatinine, Ser: 0.78 mg/dL (ref 0.44–1.00)
GFR calc Af Amer: >60 mL/min (ref >60)
GFR calc non Af Amer: >60 mL/min (ref >60)
Glucose, Bld: 121 mg/dL — ABNORMAL HIGH (ref 70–99)
Potassium: 4 mmol/L (ref 3.5–5.1)
Sodium: 142 mmol/L (ref 135–145)
Total Bilirubin: 0.6 mg/dL (ref 0.3–1.2)
Total Protein: 7.4 g/dL (ref 6.5–8.1)

## 2019-02-12 LAB — LEGIONELLA PNEUMOPHILA SEROGP 1 UR AG: L. pneumophila Serogp 1 Ur Ag: NEGATIVE

## 2019-02-12 LAB — GLUCOSE 6 PHOSPHATE DEHYDROGENASE
G6PDH: 8.8 U/g{Hb} (ref 4.6–13.5)
Hemoglobin: 14 g/dL (ref 11.1–15.9)

## 2019-02-12 LAB — FERRITIN: Ferritin: 375 ng/mL — ABNORMAL HIGH (ref 11–307)

## 2019-02-12 LAB — C-REACTIVE PROTEIN: CRP: 14.1 mg/dL — ABNORMAL HIGH (ref <1.0)

## 2019-02-12 LAB — D-DIMER, QUANTITATIVE: D-Dimer, Quant: 0.3 ug/mL-FEU (ref 0.00–0.50)

## 2019-02-12 LAB — INTERLEUKIN-6, PLASMA: Interleukin-6, Plasma: 43.9 pg/mL — ABNORMAL HIGH (ref 0.0–12.2)

## 2019-02-12 IMAGING — DX PORTABLE CHEST - 1 VIEW
1 series · 1 of 1 positions shown · non-contrast
Comparison: [DATE]

CLINICAL DATA: [UG] pneumonia.

EXAM:
PORTABLE CHEST 1 VIEW

[chest]
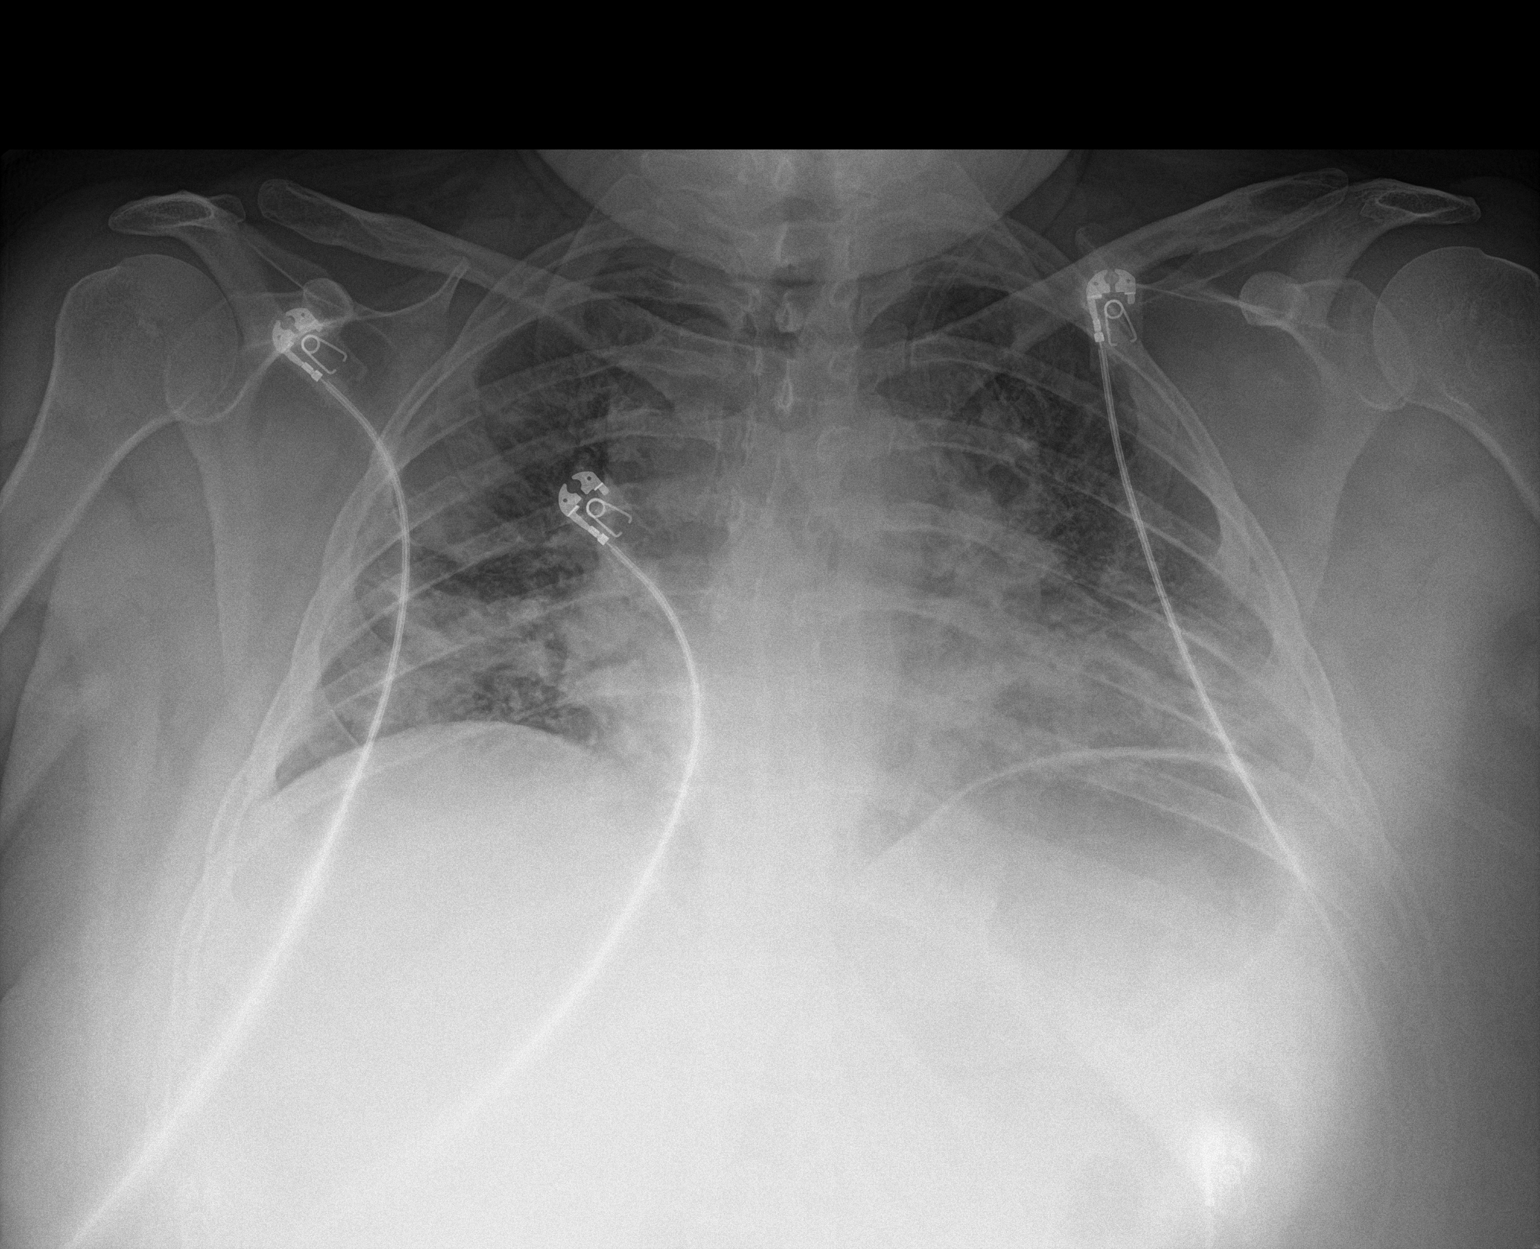

[1 of 1 positions shown; findings below may reference images not displayed]

FINDINGS: The heart size and mediastinal contours are within normal limits.
Similar appearance of bilateral pulmonary infiltrates in a
peripheral and lower zone predominance with potentially slight
worsening at the left lung base. No visualized pleural effusions or
pneumothorax. The visualized skeletal structures are unremarkable.
IMPRESSION: Similar bilateral pulmonary infiltrates with potential slight
worsening at the left lung base.

## 2019-02-12 MED ORDER — VITAMIN B-12 1000 MCG PO TABS
1000.0000 ug | ORAL_TABLET | Freq: Every day | ORAL | Status: DC
  Administered 2019-02-12 – 2019-02-19 (×8): 1000 ug via ORAL
  Filled 2019-02-12 (×8): qty 1

## 2019-02-12 MED ORDER — ONDANSETRON HCL 4 MG/2ML IJ SOLN
4.0000 mg | Freq: Three times a day (TID) | INTRAMUSCULAR | Status: DC | PRN
  Administered 2019-02-12 (×2): 4 mg via INTRAVENOUS
  Filled 2019-02-12 (×2): qty 2

## 2019-02-12 MED ORDER — LORATADINE 10 MG PO TABS
10.0000 mg | ORAL_TABLET | Freq: Every day | ORAL | Status: DC
  Administered 2019-02-12 – 2019-02-19 (×8): 10 mg via ORAL
  Filled 2019-02-12 (×8): qty 1

## 2019-02-12 MED ORDER — PROMETHAZINE HCL 25 MG/ML IJ SOLN
12.5000 mg | Freq: Four times a day (QID) | INTRAMUSCULAR | Status: DC | PRN
  Administered 2019-02-12: 13:00:00 12.5 mg via INTRAVENOUS
  Filled 2019-02-12: qty 1

## 2019-02-12 MED ORDER — VITAMIN D 25 MCG (1000 UNIT) PO TABS
1000.0000 [IU] | ORAL_TABLET | Freq: Every day | ORAL | Status: DC
  Administered 2019-02-12 – 2019-02-19 (×8): 1000 [IU] via ORAL
  Filled 2019-02-12 (×13): qty 1

## 2019-02-12 NOTE — Progress Notes (Signed)
Pt sitting up in bed. Pt O2 sat noted to be 97% on 12L HFNC. Able to wean pt to 6L [place]. Pt maintaining sats of 94-96%. Educated pt to prone or lay on her side while in bed. Encouraged pt to sit up in chair today. Pt verbalizes understanding and states that she has been using her IS. Encouraged continued compliance and educated pt to call RN if she feels SOB.

## 2019-02-12 NOTE — Progress Notes (Signed)
[place] TEAM 1 - Stepdown/ICU TEAM  Paula King  LSL:[id number] DOB: Apr 01, 1981 DOA: 02/11/2019 PCP: Alycia Rossetti, MD    Brief Narrative:  38 year old with a history of Cushing's disease and morbid obesity who presented to the [facility] ED with complaints of shortness of breath and fever for 3 days.  She tested positive for SARS-CoV-2 2 days prior to her presentation.  Review of systems was also remarkable for loss of taste and some loose stool.  In the emergency room she was noted to have patchy bilateral airspace opacities consistent with COVID pneumonia, and she suffered with hypoxia requiring supplemental oxygen.  Significant Events: 8/16 admit to [facility] via [facility]  COVID-19 specific Treatment: Remdesivir 8/16 > Decadron 8/16 >  Subjective: The patient was initially requiring very high level oxygen support.  Her oxygen support requirement has decreased overnight.  I have reviewed her chest x-ray this morning which notes bilateral infiltrates.  She is alert and conversant.  She is in the prone position at the time of my exam.  She denies chest pain nausea or vomiting.  She is in good spirits but does have to pause some during our conversation to catch her breath.  Assessment & Plan:  COVID pneumonia - acute hypoxic respiratory failure Continue prone positioning -continue Remdesivir and steroids -consider Actemra but with declining oxygen support needs will not begin dosing just yet -discussed our treatment protocol with the patient at length explaining different options to include perceived benefit as well as possible side effects of all therapies  Recent Labs  Lab 02/11/19 0307 02/12/19 0255  DDIMER <0.27 0.30  FERRITIN 483* 375*  CRP 14.2* 14.1*  ALT 88* 75*  PROCALCITON <0.10  --     Transaminitis Likely a consequence of COVID -monitor levels with ongoing use of Remdesivir -could be a component of hepatic steatosis as well -follow trend and consider further evaluation as  indicated Recent Labs  Lab 02/11/19 0307 02/12/19 0255  AST 100* 77*  ALT 88* 75*  ALKPHOS 84 73  BILITOT 1.0 0.6  PROT 8.1 7.4  ALBUMIN 3.8 3.3*    Hypokalemia Corrected  Cushing's Does not appear to require chronic therapy  Morbid obesity - Estimated body mass index is 45.49 kg/m as calculated from the following:   Height as of this encounter: 5\' 4"  (1.626 m).   Weight as of this encounter: 120.2 kg.   DVT prophylaxis: Lovenox Code Status: FULL CODE Family Communication:  Disposition Plan: Progressive care  Consultants:  none  Antimicrobials:  Azithromycin 8/16 Ceftriaxone 8/15  Objective: Blood pressure 125/84, pulse 89, temperature 98 F (36.7 C), temperature source Oral, resp. rate (!) 31, height 5\' 4"  (1.626 m), weight 120.2 kg, SpO2 95 %, not currently breastfeeding.  Intake/Output Summary (Last 24 hours) at 02/12/2019 0830 Last data filed at 02/11/2019 2139 Gross per 24 hour  Intake 2293 ml  Output 0 ml  Net 2293 ml   Filed Weights   02/11/19 0327 02/11/19 1720  Weight: 120.2 kg 120.2 kg    Examination: General: Mild respiratory distress but able to complete a conversation with only intermittent pauses Lungs: Diffuse crackles with no wheezing bilaterally Cardiovascular: Heart sounds distant and difficult to auscultate in the prone position but no appreciable rub Abdomen: Overweight, soft, no rebound, bowel sounds positive Extremities: Trace chronic appearing lower extremity edema bilaterally   CBC: Recent Labs  Lab 02/11/19 0307 02/12/19 0255  WBC 6.2 7.7  NEUTROABS 4.3 6.0  HGB 14.8 13.3  HCT 44.7 41.2  MCV 92.4 94.7  PLT 232 675   Basic Metabolic Panel: Recent Labs  Lab 02/11/19 0307 02/12/19 0255  NA 137 142  K 3.1* 4.0  CL 102 104  CO2 24 26  GLUCOSE 130* 121*  BUN 12 14  CREATININE 1.07* 0.78  CALCIUM 8.5* 8.4*   GFR: Estimated Creatinine Clearance: 121.8 mL/min (by C-G formula based on SCr of 0.78 mg/dL).  Liver  Function Tests: Recent Labs  Lab 02/11/19 0307 02/12/19 0255  AST 100* 77*  ALT 88* 75*  ALKPHOS 84 73  BILITOT 1.0 0.6  PROT 8.1 7.4  ALBUMIN 3.8 3.3*    HbA1C: Hgb A1c MFr Bld  Date/Time Value Ref Range Status  12/13/2018 10:47 AM 5.6 4.8 - 5.6 % Final    Comment:             Prediabetes: 5.7 - 6.4          Diabetes: >6.4          Glycemic control for adults with diabetes: <7.0   11/18/2017 02:33 PM 5.4 <5.7 % of total Hgb Final    Comment:    For the purpose of screening for the presence of diabetes: . <5.7%       Consistent with the absence of diabetes 5.7-6.4%    Consistent with increased risk for diabetes             (prediabetes) > or =6.5%  Consistent with diabetes . This assay result is consistent with a decreased risk of diabetes. . Currently, no consensus exists regarding use of hemoglobin A1c for diagnosis of diabetes in children. . According to American Diabetes Association (ADA) guidelines, hemoglobin A1c <7.0% represents optimal control in non-pregnant diabetic patients. Different metrics may apply to specific patient populations.  Standards of Medical Care in Diabetes(ADA). Marland Kitchen      Recent Results (from the past 240 hour(s))  SARS Coronavirus 2 [facility] order, Performed in [facility] hospital lab) Nasopharyngeal Nasopharyngeal Swab     Status: Abnormal   Collection Time: 02/11/19  3:47 AM   Specimen: Nasopharyngeal Swab  Result Value Ref Range Status   SARS Coronavirus 2 POSITIVE (A) NEGATIVE Final    Comment: CRITICAL RESULT CALLED TO, READ BACK BY AND VERIFIED WITH: DOSS,M @ 0448 ON 02/11/19 BY JUW Performed at [facility], [address] [place], [place] [address]   Blood Culture (routine x 2)     Status: None (Preliminary result)   Collection Time: 02/11/19  5:25 AM   Specimen: Left Antecubital; Blood  Result Value Ref Range Status   Specimen Description   Final    LEFT ANTECUBITAL BOTTLES DRAWN AEROBIC AND ANAEROBIC   Special  Requests   Final    Blood Culture adequate volume Performed at [facility], [address], [place], [place] [address]    Culture PENDING  Incomplete   Report Status PENDING  Incomplete  Blood Culture (routine x 2)     Status: None (Preliminary result)   Collection Time: 02/11/19  5:25 AM   Specimen: BLOOD LEFT HAND  Result Value Ref Range Status   Specimen Description BLOOD LEFT HAND BOTTLES DRAWN AEROBIC ONLY  Final   Special Requests   Final    Blood Culture adequate volume Performed at [facility], [address], [place], [place] [address]    Culture PENDING  Incomplete   Report Status PENDING  Incomplete     Scheduled Meds: . dexamethasone (DECADRON) injection  6 mg  Intravenous Q24H  . enoxaparin (LOVENOX) injection  60 mg Subcutaneous Q24H  . sodium chloride flush  3 mL Intravenous Q12H   Continuous Infusions: . sodium chloride    . remdesivir 100 mg in NS 250 mL       LOS: 1 day   Cherene Altes, MD [facility] Office  [phone] Pager - Text Page per Amion  If 7PM-7AM, please contact night-coverage per Amion 02/12/2019, 8:30 AM

## 2019-02-13 ENCOUNTER — Inpatient Hospital Stay (HOSPITAL_COMMUNITY): Payer: No Typology Code available for payment source

## 2019-02-13 LAB — HIV ANTIBODY (ROUTINE TESTING W REFLEX): HIV Screen 4th Generation wRfx: NONREACTIVE

## 2019-02-13 LAB — CBC WITH DIFFERENTIAL/PLATELET
Abs Immature Granulocytes: 0.03 10*3/uL (ref 0.00–0.07)
Basophils Absolute: 0 10*3/uL (ref 0.0–0.1)
Basophils Relative: 0 %
Eosinophils Absolute: 0 10*3/uL (ref 0.0–0.5)
Eosinophils Relative: 0 %
HCT: 40.9 % (ref 36.0–46.0)
Hemoglobin: 13 g/dL (ref 12.0–15.0)
Immature Granulocytes: 1 %
Lymphocytes Relative: 19 %
Lymphs Abs: 1.2 10*3/uL (ref 0.7–4.0)
MCH: 30.4 pg (ref 26.0–34.0)
MCHC: 31.8 g/dL (ref 30.0–36.0)
MCV: 95.8 fL (ref 80.0–100.0)
Monocytes Absolute: 0.5 10*3/uL (ref 0.1–1.0)
Monocytes Relative: 8 %
Neutro Abs: 4.6 10*3/uL (ref 1.7–7.7)
Neutrophils Relative %: 72 %
Platelets: 302 10*3/uL (ref 150–400)
RBC: 4.27 MIL/uL (ref 3.87–5.11)
RDW: 13.7 % (ref 11.5–15.5)
WBC: 6.4 10*3/uL (ref 4.0–10.5)
nRBC: 0 % (ref 0.0–0.2)

## 2019-02-13 LAB — HEPATITIS PANEL, ACUTE
HCV Ab: <0.1 s/co ratio (ref 0.0–0.9)
Hep A IgM: NEGATIVE
Hep B C IgM: NEGATIVE
Hepatitis B Surface Ag: NEGATIVE

## 2019-02-13 LAB — COMPREHENSIVE METABOLIC PANEL
ALT: 57 U/L — ABNORMAL HIGH (ref 0–44)
AST: 42 U/L — ABNORMAL HIGH (ref 15–41)
Albumin: 3.2 g/dL — ABNORMAL LOW (ref 3.5–5.0)
Alkaline Phosphatase: 62 U/L (ref 38–126)
Anion gap: 11 (ref 5–15)
BUN: 18 mg/dL (ref 6–20)
CO2: 29 mmol/L (ref 22–32)
Calcium: 8.6 mg/dL — ABNORMAL LOW (ref 8.9–10.3)
Chloride: 104 mmol/L (ref 98–111)
Creatinine, Ser: 0.83 mg/dL (ref 0.44–1.00)
GFR calc Af Amer: >60 mL/min (ref >60)
GFR calc non Af Amer: >60 mL/min (ref >60)
Glucose, Bld: 113 mg/dL — ABNORMAL HIGH (ref 70–99)
Potassium: 3.6 mmol/L (ref 3.5–5.1)
Sodium: 144 mmol/L (ref 135–145)
Total Bilirubin: 0.6 mg/dL (ref 0.3–1.2)
Total Protein: 7.6 g/dL (ref 6.5–8.1)

## 2019-02-13 LAB — FERRITIN: Ferritin: 341 ng/mL — ABNORMAL HIGH (ref 11–307)

## 2019-02-13 LAB — C-REACTIVE PROTEIN: CRP: 10.8 mg/dL — ABNORMAL HIGH (ref <1.0)

## 2019-02-13 LAB — D-DIMER, QUANTITATIVE: D-Dimer, Quant: <0.27 ug/mL-FEU (ref 0.00–0.50)

## 2019-02-13 IMAGING — DX PORTABLE CHEST - 1 VIEW
1 series · 1 of 1 positions shown · non-contrast
Comparison: [DATE]

CLINICAL DATA: Pneumonia due to [8R]

EXAM:
PORTABLE CHEST 1 VIEW

[chest]
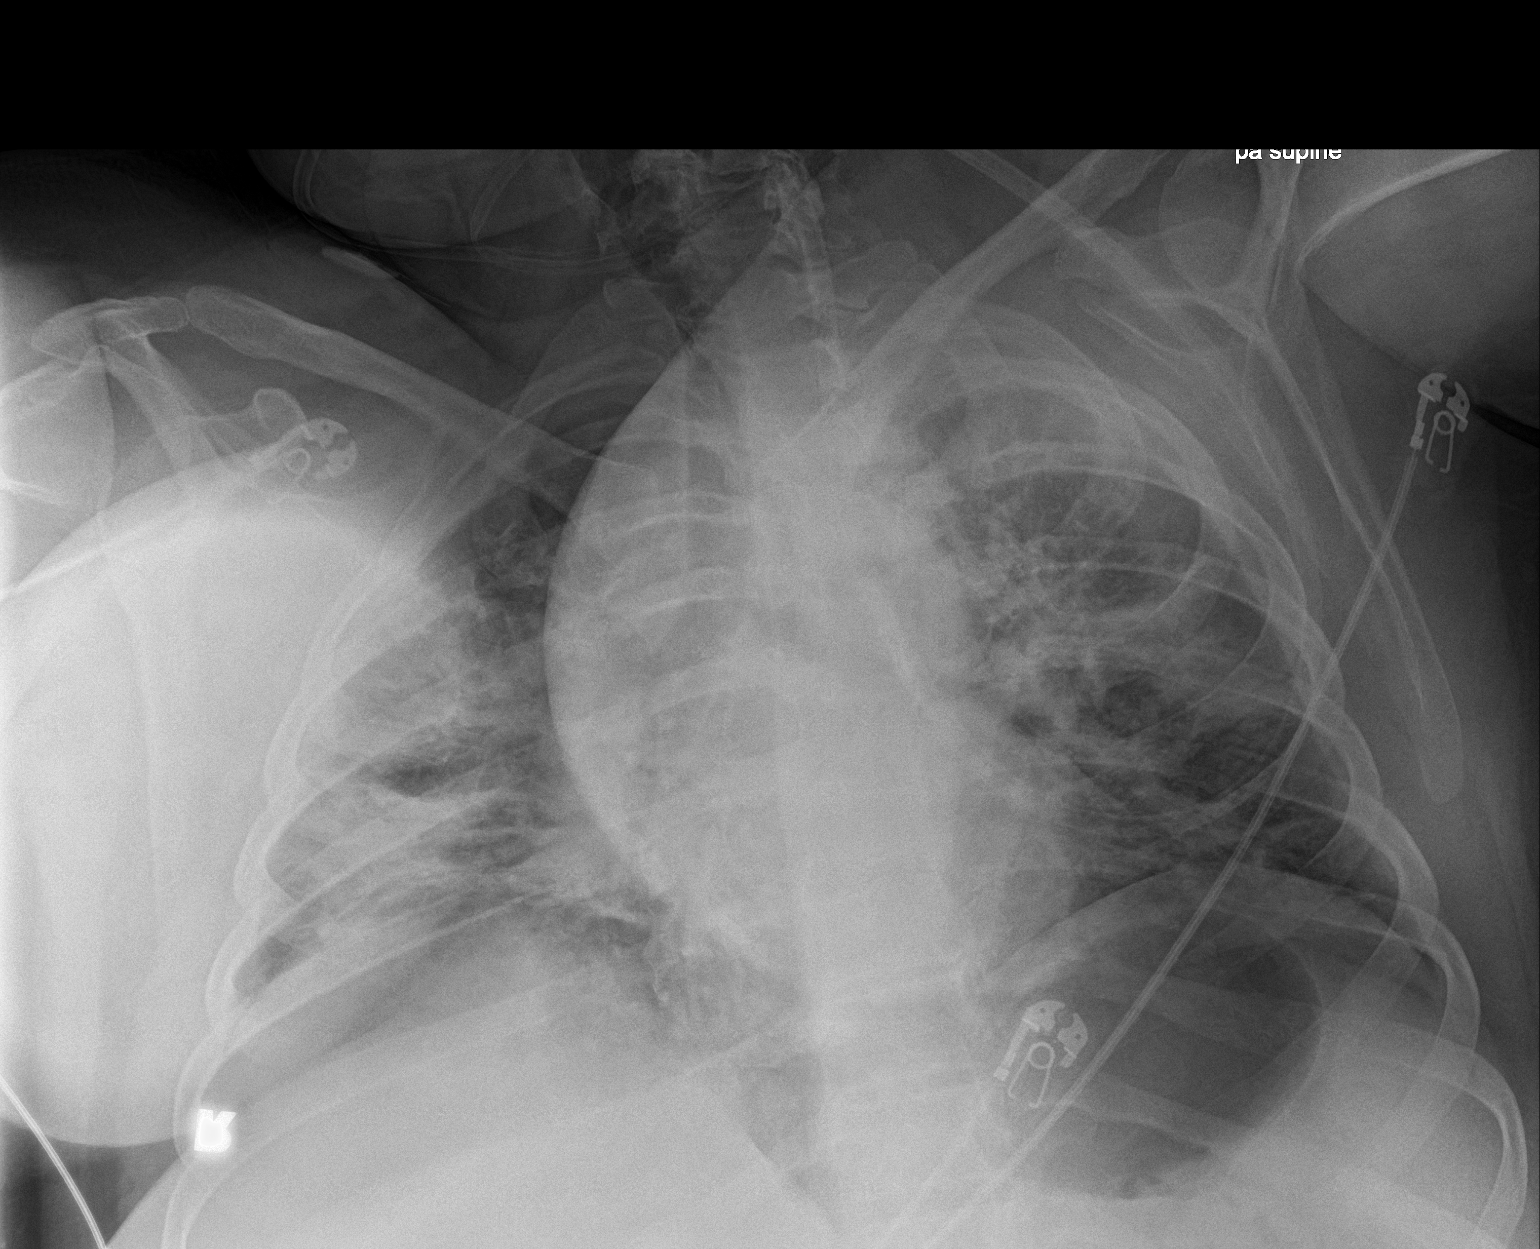

[1 of 1 positions shown; findings below may reference images not displayed]

FINDINGS: Very low lung volumes. Patchy bilateral airspace disease is similar
to prior study. Heart is normal size. No effusions or acute bony
abnormality.
IMPRESSION: Very low lung volumes. Continued patchy bilateral airspace disease,
unchanged.

## 2019-02-13 MED ORDER — METHYLPREDNISOLONE SODIUM SUCC 125 MG IJ SOLR
60.0000 mg | Freq: Two times a day (BID) | INTRAMUSCULAR | Status: DC
  Administered 2019-02-13 – 2019-02-14 (×2): 60 mg via INTRAVENOUS
  Filled 2019-02-13 (×2): qty 2

## 2019-02-13 MED ORDER — TOCILIZUMAB 400 MG/20ML IV SOLN
800.0000 mg | Freq: Once | INTRAVENOUS | Status: AC
  Administered 2019-02-13: 800 mg via INTRAVENOUS
  Filled 2019-02-13: qty 40

## 2019-02-13 NOTE — Plan of Care (Signed)
Pt experiencing dyspnea with exertion requiring with O2 desat. Recovering <5 minutes. No other concerns at this time. Will continue to monitor. Problem: Education: Goal: Knowledge of General Education information will improve Description: Including pain rating scale, medication(s)/side effects and non-pharmacologic comfort measures Outcome: Progressing   Problem: Health Behavior/Discharge Planning: Goal: Ability to manage health-related needs will improve Outcome: Progressing   Problem: Clinical Measurements: Goal: Ability to maintain clinical measurements within normal limits will improve Outcome: Progressing Goal: Will remain free from infection Outcome: Progressing Goal: Diagnostic test results will improve Outcome: Progressing Goal: Respiratory complications will improve Outcome: Progressing Goal: Cardiovascular complication will be avoided Outcome: Progressing   Problem: Activity: Goal: Risk for activity intolerance will decrease Outcome: Progressing   Problem: Nutrition: Goal: Adequate nutrition will be maintained Outcome: Progressing   Problem: Coping: Goal: Level of anxiety will decrease Outcome: Progressing   Problem: Pain Managment: Goal: General experience of comfort will improve Outcome: Progressing   Problem: Safety: Goal: Ability to remain free from injury will improve Outcome: Progressing

## 2019-02-13 NOTE — Progress Notes (Signed)
TEAM 1 - Stepdown/ICU TEAM  Paula King  MPN:[id number] DOB: May 15, 1981 DOA: 02/11/2019 PCP: Alycia Rossetti, MD    Brief Narrative:  38 year old with a history of Cushing's disease and morbid obesity who presented to the [facility] with complaints of shortness of breath and fever for 3 days.  She tested positive for SARS-CoV-2 2 days prior to her presentation.  Review of systems was also remarkable for loss of taste and some loose stool.  In the emergency room she was noted to have patchy bilateral airspace opacities consistent with COVID pneumonia, and she suffered with hypoxia requiring supplemental oxygen.  Significant Events: 8/16 admit to [facility] via [facility]  COVID-19 specific Treatment: Remdesivir 8/16 > Decadron 8/16 >  Subjective: The patient had a "rough night" last night.  She reports feeling that she was smothering and having a very hard time catching her breath, even when in the prone position.  Indeed her oxygen requirement increased to 15 L/min high flow nasal cannula.  At the time of my exam her oxygen requirement has decreased to 8 L of high flow nasal cannula.  She presently denies chest pain, nausea vomiting, abdominal pain.  I spoke with the patient at length about our treatment protocols here at [facility].  I spoke to her about our off label use of Actemra and our belief that it helped prevent progression to ARDS.  Given that her CRP is still greater than 10 and that she had significant hypoxia last night I have suggested that we dosed her with Actemra today.  I explained the side effects and potential complications related to the use of Actemra.  The patient voiced understanding and agreed with this use.  I called and spoke with the patient's sister who is a Designer, jewellery at request of the patient and discussed the use of Actemra with her at length and she also agreed with its use.  Assessment & Plan:  COVID pneumonia - acute hypoxic respiratory failure  Continue prone positioning - continue Remdesivir and steroids -given markedly increased oxygen demands last night we will dosed with Actemra x1 today -continue to follow serum markers -adjust oxygen support as needed  Recent Labs  Lab 02/11/19 0307 02/12/19 0255 02/13/19 0400  DDIMER <0.27 0.30 <0.27  FERRITIN 483* 375* 341*  CRP 14.2* 14.1* 10.8*  ALT 88* 75* 71*  PROCALCITON <0.10  --   --     Transaminitis Likely a consequence of COVID - monitor levels with ongoing use of Remdesivir -could be a component of hepatic steatosis as well - cont to follow trend and consider further evaluation as indicated  Recent Labs  Lab 02/11/19 0307 02/12/19 0255 02/13/19 0400  AST 100* 77* 42*  ALT 88* 75* 57*  ALKPHOS 84 73 62  BILITOT 1.0 0.6 0.6  PROT 8.1 7.4 7.6  ALBUMIN 3.8 3.3* 3.2*    Hypokalemia Corrected  Cushing's Does not appear to require chronic therapy of any kind   Morbid obesity - Estimated body mass index is 45.49 kg/m as calculated from the following:   Height as of this encounter: 5\' 4"  (1.626 m).   Weight as of this encounter: 120.2 kg.   DVT prophylaxis: Lovenox Code Status: FULL CODE Family Communication: Spoke with sister (an NP) at length via phone Disposition Plan: remain in progressive care -titrate oxygen per [place] high flow nasal cannula as needed  Consultants:  none  Antimicrobials:  Azithromycin 8/16 Ceftriaxone 8/15  Objective: Blood pressure 107/74,  pulse 67, temperature 98.2 F (36.8 C), resp. rate (!) 22, height 5\' 4"  (1.626 m), weight 120.2 kg, SpO2 94 %, not currently breastfeeding. No intake or output data in the 24 hours ending 02/13/19 0830 Filed Weights   02/11/19 0327 02/11/19 1720  Weight: 120.2 kg 120.2 kg    Examination: General: Able to complete full sentences with only mild respiratory distress Lungs: Persistent diffuse bilateral crackles with no wheezing Cardiovascular: Distant heart sounds with no appreciable murmur  or rub -RRR Abdomen: Overweight, soft, no rebound, bowel sounds positive Extremities: Trace edema bilateral lower extremities without cyanosis   CBC: Recent Labs  Lab 02/11/19 0307 02/11/19 0525 02/12/19 0255 02/13/19 0400  WBC 6.2  --  7.7 6.4  NEUTROABS 4.3  --  6.0 4.6  HGB 14.8 14.0 13.3 13.0  HCT 44.7  --  41.2 40.9  MCV 92.4  --  94.7 95.8  PLT 232  --  261 614   Basic Metabolic Panel: Recent Labs  Lab 02/11/19 0307 02/12/19 0255 02/13/19 0400  NA 137 142 144  K 3.1* 4.0 3.6  CL 102 104 104  CO2 24 26 29   GLUCOSE 130* 121* 113*  BUN 12 14 18   CREATININE 1.07* 0.78 0.83  CALCIUM 8.5* 8.4* 8.6*   GFR: Estimated Creatinine Clearance: 117.4 mL/min (by C-G formula based on SCr of 0.83 mg/dL).  Liver Function Tests: Recent Labs  Lab 02/11/19 0307 02/12/19 0255 02/13/19 0400  AST 100* 77* 42*  ALT 88* 75* 57*  ALKPHOS 84 73 62  BILITOT 1.0 0.6 0.6  PROT 8.1 7.4 7.6  ALBUMIN 3.8 3.3* 3.2*    HbA1C: Hgb A1c MFr Bld  Date/Time Value Ref Range Status  12/13/2018 10:47 AM 5.6 4.8 - 5.6 % Final    Comment:             Prediabetes: 5.7 - 6.4          Diabetes: >6.4          Glycemic control for adults with diabetes: <7.0   11/18/2017 02:33 PM 5.4 <5.7 % of total Hgb Final    Comment:    For the purpose of screening for the presence of diabetes: . <5.7%       Consistent with the absence of diabetes 5.7-6.4%    Consistent with increased risk for diabetes             (prediabetes) > or =6.5%  Consistent with diabetes . This assay result is consistent with a decreased risk of diabetes. . Currently, no consensus exists regarding use of hemoglobin A1c for diagnosis of diabetes in children. . According to American Diabetes Association (ADA) guidelines, hemoglobin A1c <7.0% represents optimal control in non-pregnant diabetic patients. Different metrics may apply to specific patient populations.  Standards of Medical Care in Diabetes(ADA). Marland Kitchen       Recent Results (from the past 240 hour(s))  SARS Coronavirus 2 [facility] order, Performed in [facility] hospital lab) Nasopharyngeal Nasopharyngeal Swab     Status: Abnormal   Collection Time: 02/11/19  3:47 AM   Specimen: Nasopharyngeal Swab  Result Value Ref Range Status   SARS Coronavirus 2 POSITIVE (A) NEGATIVE Final    Comment: CRITICAL RESULT CALLED TO, READ BACK BY AND VERIFIED WITH: DOSS,M @ 0448 ON 02/11/19 BY JUW Performed at [facility], [address], [place], [place] [address]   Blood Culture (routine x 2)     Status: None (Preliminary result)   Collection Time: 02/11/19  5:25 AM   Specimen: Left Antecubital; Blood  Result Value Ref Range Status   Specimen Description   Final    LEFT ANTECUBITAL BOTTLES DRAWN AEROBIC AND ANAEROBIC   Special Requests Blood Culture adequate volume  Final   Culture   Final    NO GROWTH 2 DAYS Performed at [facility], [address] [place], [place] [address]    Report Status PENDING  Incomplete  Blood Culture (routine x 2)     Status: None (Preliminary result)   Collection Time: 02/11/19  5:25 AM   Specimen: BLOOD LEFT HAND  Result Value Ref Range Status   Specimen Description BLOOD LEFT HAND BOTTLES DRAWN AEROBIC ONLY  Final   Special Requests Blood Culture adequate volume  Final   Culture   Final    NO GROWTH 2 DAYS Performed at [facility], [address] [place], [place] [address]    Report Status PENDING  Incomplete     Scheduled Meds: . dexamethasone (DECADRON) injection  6 mg Intravenous Q24H  . enoxaparin (LOVENOX) injection  60 mg Subcutaneous Q24H  . loratadine  10 mg Oral Daily  . sodium chloride flush  3 mL Intravenous Q12H  . vitamin B-12  1,000 mcg Oral Daily  . cholecalciferol  1,000 Units Oral Daily   Continuous Infusions: . sodium chloride    . remdesivir 100 mg in NS 250 mL 100 mg (02/12/19 1612)     LOS: 2 days   Cherene Altes, MD [facility] Office  [phone] Pager - Text Page per  Amion  If 7PM-7AM, please contact night-coverage per Amion 02/13/2019, 8:30 AM

## 2019-02-13 NOTE — Consult Note (Signed)
   [facility] CM Inpatient Consult   02/13/2019  Paula King [birth year] [id number]   Patient screened for pending status for [facility] Care Management post transition follow up with the [facility] plan.  Plan:  Patient will be followed as appropriate at transition.   Please place a [facility] Management consult,  as appropriate,  for particular post hospital needs and for questions contact:   Natividad Brood, RN BSN [facility] Liaison  [phone] business mobile phone Toll free office [phone]  Fax number: [phone] [name]

## 2019-02-13 NOTE — Progress Notes (Signed)
Patient is complaining of increased SOB, oxygen turned up to 15L HFNC O2 saturation 90 %.  Patient able to self prone and has been on her stomach for several hours.  Patient given albuterol inhaler and tramadol.

## 2019-02-14 ENCOUNTER — Ambulatory Visit: Payer: No Typology Code available for payment source | Admitting: Family Medicine

## 2019-02-14 ENCOUNTER — Encounter: Payer: No Typology Code available for payment source | Admitting: Family Medicine

## 2019-02-14 LAB — C-REACTIVE PROTEIN: CRP: 6.1 mg/dL — ABNORMAL HIGH (ref <1.0)

## 2019-02-14 LAB — CBC WITH DIFFERENTIAL/PLATELET
Abs Immature Granulocytes: 0.08 10*3/uL — ABNORMAL HIGH (ref 0.00–0.07)
Basophils Absolute: 0 10*3/uL (ref 0.0–0.1)
Basophils Relative: 0 %
Eosinophils Absolute: 0 10*3/uL (ref 0.0–0.5)
Eosinophils Relative: 0 %
HCT: 42.5 % (ref 36.0–46.0)
Hemoglobin: 13.6 g/dL (ref 12.0–15.0)
Immature Granulocytes: 1 %
Lymphocytes Relative: 20 %
Lymphs Abs: 1.3 10*3/uL (ref 0.7–4.0)
MCH: 30.6 pg (ref 26.0–34.0)
MCHC: 32 g/dL (ref 30.0–36.0)
MCV: 95.5 fL (ref 80.0–100.0)
Monocytes Absolute: 0.7 10*3/uL (ref 0.1–1.0)
Monocytes Relative: 11 %
Neutro Abs: 4.6 10*3/uL (ref 1.7–7.7)
Neutrophils Relative %: 68 %
Platelets: 356 10*3/uL (ref 150–400)
RBC: 4.45 MIL/uL (ref 3.87–5.11)
RDW: 13.5 % (ref 11.5–15.5)
WBC: 6.7 10*3/uL (ref 4.0–10.5)
nRBC: 0 % (ref 0.0–0.2)

## 2019-02-14 LAB — FERRITIN: Ferritin: 265 ng/mL (ref 11–307)

## 2019-02-14 LAB — COMPREHENSIVE METABOLIC PANEL
ALT: 51 U/L — ABNORMAL HIGH (ref 0–44)
AST: 42 U/L — ABNORMAL HIGH (ref 15–41)
Albumin: 3.1 g/dL — ABNORMAL LOW (ref 3.5–5.0)
Alkaline Phosphatase: 71 U/L (ref 38–126)
Anion gap: 11 (ref 5–15)
BUN: 20 mg/dL (ref 6–20)
CO2: 26 mmol/L (ref 22–32)
Calcium: 8.7 mg/dL — ABNORMAL LOW (ref 8.9–10.3)
Chloride: 106 mmol/L (ref 98–111)
Creatinine, Ser: 0.94 mg/dL (ref 0.44–1.00)
GFR calc Af Amer: >60 mL/min (ref >60)
GFR calc non Af Amer: >60 mL/min (ref >60)
Glucose, Bld: 119 mg/dL — ABNORMAL HIGH (ref 70–99)
Potassium: 3.8 mmol/L (ref 3.5–5.1)
Sodium: 143 mmol/L (ref 135–145)
Total Bilirubin: 0.7 mg/dL (ref 0.3–1.2)
Total Protein: 7.3 g/dL (ref 6.5–8.1)

## 2019-02-14 LAB — D-DIMER, QUANTITATIVE: D-Dimer, Quant: 0.43 ug/mL-FEU (ref 0.00–0.50)

## 2019-02-14 MED ORDER — METHYLPREDNISOLONE SODIUM SUCC 125 MG IJ SOLR
60.0000 mg | Freq: Three times a day (TID) | INTRAMUSCULAR | Status: DC
  Administered 2019-02-14 – 2019-02-18 (×12): 60 mg via INTRAVENOUS
  Filled 2019-02-14 (×12): qty 2

## 2019-02-14 NOTE — Plan of Care (Signed)

## 2019-02-14 NOTE — Progress Notes (Signed)
[name]  VOJ:[id number] DOB: October 09, 1980 DOA: 02/11/2019 PCP: Alycia Rossetti, MD   Brief Narrative: Paula King is a 38 y.o. female with a history of Cushing's disease and morbid obesity who presented to the [facility] with complaints of shortness of breath and fever for 3 days.  She tested positive for SARS-CoV-2 2 days prior to her presentation.  Review of systems was also remarkable for loss of taste and some loose stool.  In the emergency room she was noted to have patchy bilateral airspace opacities consistent with COVID pneumonia, and she suffered with hypoxia requiring supplemental oxygen. She was admitted to [facility], started on remdesivir and steroids. With worsening hypoxia, after discussion of its off-label nature, tocilizumab was given on 8/18.   Assessment & Plan: Principal Problem:   COVID-19 virus infection Active Problems:   Acute respiratory failure with hypoxia (HCC)   Hypokalemia   Abnormal liver function  Acute hypoxic respiratory failure due to covid-19 pneumonia:  - Continue remdesivir 8/16 - 8/20.  - Continue steroids, augment today.  - Reviewed frequent IS and proning - s/p tocilizumab x1 8/28, monitor LFTs and for secondary infections.  - Follow inflammatory markers. Ferritin normalized, D-dimer wnl, CRP coming down  Morbid obesity:  - Weight loss recommended long term  Elevated LFTs: Suspect an element of hepatic steatosis, possibly irritated by viral infection. Mild and improving, can continue remdesivir.   Cushing's disease: Not on chronic therapy  Hypokalemia: Resolved.   DVT prophylaxis: Lovenox Code Status: Full Family Communication: Called sister's phone, LVM. Disposition Plan: Uncertain, will require improvement in hypoxia.   Consultants:   None  Procedures:   None  Antimicrobials:  Remdesivir   Subjective: She likely got this when testing patient for covid as working as [employer] Feeling much better today than at  admission, only slightly better breathing today from yesterday. Primary complaint is fatigue, severe, constant, and exertional dyspnea even with just getting to [facility].   Objective: Vitals:   02/14/19 0021 02/14/19 0532 02/14/19 0740 02/14/19 1133  BP: 106/66 108/79 115/83 111/81  Pulse:  75 66 86  Resp:   (!) 32 (!) 30  Temp: 98.9 F (37.2 C) 98.7 F (37.1 C) 98.1 F (36.7 C) 98.7 F (37.1 C)  TempSrc: Oral Oral Oral Oral  SpO2:   92% 90%  Weight:      Height:        Intake/Output Summary (Last 24 hours) at 02/14/2019 1337 Last data filed at 02/13/2019 2300 Gross per 24 hour  Intake 480 ml  Output 700 ml  Net -220 ml   Filed Weights   02/11/19 0327 02/11/19 1720  Weight: 120.2 kg 120.2 kg    Gen: 38 y.o. female in no distress Pulm: Non-labored breathing at rest, becomes dyspneic with longer sentences. Crackles bilaterally  CV: Regular rate and rhythm. No murmur, rub, or gallop. No JVD, no pedal edema. GI: Abdomen soft, non-tender, non-distended, with normoactive bowel sounds. No organomegaly or masses felt. Ext: Warm, no deformities Skin: No rashes, lesions no ulcers Neuro: Alert and oriented. No focal neurological deficits. Psych: Judgement and insight appear normal. Mood & affect appropriate.   Data Reviewed: I have personally reviewed following labs and imaging studies  CBC: Recent Labs  Lab 02/11/19 0307 02/11/19 0525 02/12/19 0255 02/13/19 0400 02/14/19 0545  WBC 6.2  --  7.7 6.4 6.7  NEUTROABS 4.3  --  6.0 4.6 4.6  HGB 14.8 14.0 13.3 13.0 13.6  HCT 44.7  --  41.2 40.9 42.5  MCV 92.4  --  94.7 95.8 95.5  PLT 232  --  261 302 194   Basic Metabolic Panel: Recent Labs  Lab 02/11/19 0307 02/12/19 0255 02/13/19 0400 02/14/19 0545  NA 137 142 144 143  K 3.1* 4.0 3.6 3.8  CL 102 104 104 106  CO2 24 26 29 26   GLUCOSE 130* 121* 113* 119*  BUN 12 14 18 20   CREATININE 1.07* 0.78 0.83 0.94  CALCIUM 8.5* 8.4* 8.6* 8.7*   GFR: Estimated Creatinine  Clearance: 103.6 mL/min (by C-G formula based on SCr of 0.94 mg/dL). Liver Function Tests: Recent Labs  Lab 02/11/19 0307 02/12/19 0255 02/13/19 0400 02/14/19 0545  AST 100* 77* 42* 42*  ALT 88* 75* 57* 51*  ALKPHOS 84 73 62 71  BILITOT 1.0 0.6 0.6 0.7  PROT 8.1 7.4 7.6 7.3  ALBUMIN 3.8 3.3* 3.2* 3.1*   No results for input(s): LIPASE, AMYLASE in the last 168 hours. No results for input(s): AMMONIA in the last 168 hours. Coagulation Profile: No results for input(s): INR, PROTIME in the last 168 hours. Cardiac Enzymes: Recent Labs  Lab 02/11/19 0525  CKTOTAL 79  CKMB 0.4*   BNP (last 3 results) No results for input(s): PROBNP in the last 8760 hours. HbA1C: No results for input(s): HGBA1C in the last 72 hours. CBG: No results for input(s): GLUCAP in the last 168 hours. Lipid Profile: No results for input(s): CHOL, HDL, LDLCALC, TRIG, CHOLHDL, LDLDIRECT in the last 72 hours. Thyroid Function Tests: No results for input(s): TSH, T4TOTAL, FREET4, T3FREE, THYROIDAB in the last 72 hours. Anemia Panel: Recent Labs    02/13/19 0400 02/14/19 0545  FERRITIN 341* 265   Urine analysis:    Component Value Date/Time   COLORURINE YELLOW 08/09/2014 1405   APPEARANCEUR CLOUDY (A) 08/09/2014 1405   LABSPEC 1.025 04/18/2017 0939   PHURINE 5.0 04/18/2017 0939   GLUCOSEU NEGATIVE 04/18/2017 0939   HGBUR NEGATIVE 04/18/2017 0939   BILIRUBINUR Negative 08/21/2017 [place] 04/18/2017 0939   PROTEINUR Negative 08/21/2017 1227   PROTEINUR NEGATIVE 04/18/2017 0939   UROBILINOGEN 0.2 08/21/2017 1227   UROBILINOGEN 0.2 04/18/2017 0939   NITRITE Negative 08/21/2017 1227   NITRITE NEGATIVE 04/18/2017 0939   LEUKOCYTESUR Negative 08/21/2017 1227   Recent Results (from the past 240 hour(s))  SARS Coronavirus 2 [facility] order, Performed in [facility] hospital lab) Nasopharyngeal Nasopharyngeal Swab     Status: Abnormal   Collection Time: 02/11/19  3:47 AM   Specimen:  Nasopharyngeal Swab  Result Value Ref Range Status   SARS Coronavirus 2 POSITIVE (A) NEGATIVE Final    Comment: CRITICAL RESULT CALLED TO, READ BACK BY AND VERIFIED WITH: DOSS,M @ 0448 ON 02/11/19 BY JUW Performed at [facility], [address] [place], [place] [address]   Blood Culture (routine x 2)     Status: None (Preliminary result)   Collection Time: 02/11/19  5:25 AM   Specimen: Left Antecubital; Blood  Result Value Ref Range Status   Specimen Description   Final    LEFT ANTECUBITAL BOTTLES DRAWN AEROBIC AND ANAEROBIC   Special Requests Blood Culture adequate volume  Final   Culture   Final    NO GROWTH 3 DAYS Performed at [facility], [address] [place], [place] [address]    Report Status PENDING  Incomplete  Blood Culture (routine x 2)     Status: None (Preliminary result)   Collection Time: 02/11/19  5:25 AM  Specimen: BLOOD LEFT HAND  Result Value Ref Range Status   Specimen Description BLOOD LEFT HAND BOTTLES DRAWN AEROBIC ONLY  Final   Special Requests Blood Culture adequate volume  Final   Culture   Final    NO GROWTH 3 DAYS Performed at [facility], [address] [place], [place] [address]    Report Status PENDING  Incomplete      Radiology Studies: Dg Chest Port 1 View  Result Date: 02/13/2019 CLINICAL DATA:  Pneumonia due to COVID-19 EXAM: PORTABLE CHEST 1 VIEW COMPARISON:  02/12/2019 FINDINGS: Very low lung volumes. Patchy bilateral airspace disease is similar to prior study. Heart is normal size. No effusions or acute bony abnormality. IMPRESSION: Very low lung volumes. Continued patchy bilateral airspace disease, unchanged. Electronically Signed   By: Rolm Baptise M.D.   On: 02/13/2019 07:58    Scheduled Meds: . enoxaparin (LOVENOX) injection  60 mg Subcutaneous Q24H  . loratadine  10 mg Oral Daily  . methylPREDNISolone (SOLU-MEDROL) injection  60 mg Intravenous Q8H  . sodium chloride flush  3 mL Intravenous Q12H  . vitamin B-12  1,000 mcg Oral  Daily  . cholecalciferol  1,000 Units Oral Daily   Continuous Infusions: . sodium chloride    . remdesivir 100 mg in NS 250 mL 100 mg (02/13/19 1437)     LOS: 3 days   Time spent: 35 minutes.  Patrecia Pour, MD [facility] [email or web address] Password [facility] 02/14/2019, 1:37 PM

## 2019-02-14 NOTE — Progress Notes (Signed)
Spoke with patient's sister Threasa Beards on phone for update. Family is updated on patient's condition and plan of care. Has no other questions currently.

## 2019-02-14 NOTE — Plan of Care (Signed)
Primary contact called for update, no answer. Will retry.

## 2019-02-15 LAB — COMPREHENSIVE METABOLIC PANEL
ALT: 85 U/L — ABNORMAL HIGH (ref 0–44)
AST: 85 U/L — ABNORMAL HIGH (ref 15–41)
Albumin: 3 g/dL — ABNORMAL LOW (ref 3.5–5.0)
Alkaline Phosphatase: 66 U/L (ref 38–126)
Anion gap: 9 (ref 5–15)
BUN: 21 mg/dL — ABNORMAL HIGH (ref 6–20)
CO2: 27 mmol/L (ref 22–32)
Calcium: 8.6 mg/dL — ABNORMAL LOW (ref 8.9–10.3)
Chloride: 105 mmol/L (ref 98–111)
Creatinine, Ser: 0.85 mg/dL (ref 0.44–1.00)
GFR calc Af Amer: >60 mL/min (ref >60)
GFR calc non Af Amer: >60 mL/min (ref >60)
Glucose, Bld: 176 mg/dL — ABNORMAL HIGH (ref 70–99)
Potassium: 3.9 mmol/L (ref 3.5–5.1)
Sodium: 141 mmol/L (ref 135–145)
Total Bilirubin: 0.6 mg/dL (ref 0.3–1.2)
Total Protein: 7 g/dL (ref 6.5–8.1)

## 2019-02-15 LAB — C-REACTIVE PROTEIN: CRP: 2.9 mg/dL — ABNORMAL HIGH (ref <1.0)

## 2019-02-15 LAB — CBC WITH DIFFERENTIAL/PLATELET
Abs Immature Granulocytes: 0.16 10*3/uL — ABNORMAL HIGH (ref 0.00–0.07)
Basophils Absolute: 0 10*3/uL (ref 0.0–0.1)
Basophils Relative: 0 %
Eosinophils Absolute: 0 10*3/uL (ref 0.0–0.5)
Eosinophils Relative: 0 %
HCT: 40.9 % (ref 36.0–46.0)
Hemoglobin: 13.2 g/dL (ref 12.0–15.0)
Immature Granulocytes: 2 %
Lymphocytes Relative: 13 %
Lymphs Abs: 1.1 10*3/uL (ref 0.7–4.0)
MCH: 30.8 pg (ref 26.0–34.0)
MCHC: 32.3 g/dL (ref 30.0–36.0)
MCV: 95.3 fL (ref 80.0–100.0)
Monocytes Absolute: 0.4 10*3/uL (ref 0.1–1.0)
Monocytes Relative: 5 %
Neutro Abs: 6.4 10*3/uL (ref 1.7–7.7)
Neutrophils Relative %: 80 %
Platelets: 378 10*3/uL (ref 150–400)
RBC: 4.29 MIL/uL (ref 3.87–5.11)
RDW: 13.2 % (ref 11.5–15.5)
WBC: 8.1 10*3/uL (ref 4.0–10.5)
nRBC: 0 % (ref 0.0–0.2)

## 2019-02-15 LAB — GLUCOSE, CAPILLARY
Glucose-Capillary: 150 mg/dL — ABNORMAL HIGH (ref 70–99)
Glucose-Capillary: 110 mg/dL — ABNORMAL HIGH (ref 70–99)

## 2019-02-15 MED ORDER — INSULIN ASPART 100 UNIT/ML ~~LOC~~ SOLN
0.0000 [IU] | Freq: Three times a day (TID) | SUBCUTANEOUS | Status: DC
  Administered 2019-02-16 – 2019-02-18 (×3): 2 [IU] via SUBCUTANEOUS
  Administered 2019-02-18: 1 [IU] via SUBCUTANEOUS
  Administered 2019-02-18: 2 [IU] via SUBCUTANEOUS

## 2019-02-15 MED ORDER — INSULIN ASPART 100 UNIT/ML ~~LOC~~ SOLN
0.0000 [IU] | Freq: Every day | SUBCUTANEOUS | Status: DC
  Administered 2019-02-17: 2 [IU] via SUBCUTANEOUS

## 2019-02-15 NOTE — Progress Notes (Signed)
Patient deferred family update phone call. 

## 2019-02-15 NOTE — Plan of Care (Signed)
Will continue with plan of care. 

## 2019-02-15 NOTE — Evaluation (Signed)
Occupational Therapy Evaluation Patient Details Name: Paula King MRN: [id number] DOB: 05/30/1981 Today's Date: 02/15/2019    History of Present Illness Pt is a 38 y.o. female with a history of Cushing's disease, HTN, and morbid obesity who presented to the [facility] with complaints of SOB and fever for 3 days. She tested positive for SARS-CoV-2 2 days prior to her presentation.   Clinical Impression   PTA Pt independent in ADL and mobility, works full time as a Technical brewer, enjoys swimming laps and walking and spending time with her 81 year old daughter. Today Pt is initially on 12 L O2 via HFNC, with transfer to [facility] at supervision level Pt desaturated to low 70's requiring cues for pursed lip breathing and increased O2 support at 15L HFNC. Pt able to participate In full body bathing on [facility] completing full bath and requiring mod A overall with  Multiple rest breaks for breathing. Pt anxious about respiratory status throughout session. Pt able to perform grooming with rest breaks at set up level. Pt reports that she feels like she is getting "frozen shoulder" but has full ROM in BUE. Encouraged her to focus on shoulder FF and ABduction doing 2-5 reps every time a show goes to commercial. Pt able to transfer back to bed SPT at supervision level and assist for line management only. Anxiety definitely impacts performance. Anticipate that Pt will progress and not need OT follow up post-acute but will require skilled OT in the acute setting. Next session focus on further mobility, energy conservation education and activity tolerance.      Follow Up Recommendations  No OT follow up;Supervision - Intermittent    Equipment Recommendations  3 in 1 bedside commode    Recommendations for Other Services       Precautions / Restrictions Restrictions Weight Bearing Restrictions: No      Mobility Bed Mobility Overal bed mobility: Modified Independent             General bed mobility comments: increased  time and effort  Transfers Overall transfer level: Needs assistance Equipment used: None Transfers: Sit to/from Stand;Stand Pivot Transfers Sit to Stand: Supervision Stand pivot transfers: Supervision       General transfer comment: to [facility], required increased O2 from 12 to 15L with activity and cues for breathing    Balance Overall balance assessment: Mild deficits observed, not formally tested                                         ADL either performed or assessed with clinical judgement   ADL Overall ADL's : Needs assistance/impaired Eating/Feeding: Independent   Grooming: Oral care;Wash/dry face;Wash/dry hands;Brushing hair;Set up;Sitting Grooming Details (indicate cue type and reason): decreased activity tolerance, has to pace herself Upper Body Bathing: Minimal assistance   Lower Body Bathing: Moderate assistance   Upper Body Dressing : Set up   Lower Body Dressing: Minimal assistance   Toilet Transfer: Supervision/safety;Stand-pivot;BSC   Toileting- Clothing Manipulation and Hygiene: Supervision/safety;Sit to/from stand Toileting - Clothing Manipulation Details (indicate cue type and reason): in standing with warm wash cloth   Tub/Shower Transfer Details (indicate cue type and reason): educated on use of 3 in 1 as shower seat Functional mobility during ADLs: Supervision/safety(SPT only) General ADL Comments: desats with activity, requiring increased O2 support from 12-15L, Pt anxious and requiring cues for breathing throughout session     Vision  Baseline Vision/History: Wears glasses Wears Glasses: At all times Patient Visual Report: No change from baseline       Perception     Praxis      Pertinent Vitals/Pain Pain Assessment: No/denies pain     Hand Dominance Left   Extremity/Trunk Assessment Upper Extremity Assessment Upper Extremity Assessment: Generalized weakness(states that she feels like she's got "frozen shoulder")    Lower Extremity Assessment Lower Extremity Assessment: Defer to PT evaluation   Cervical / Trunk Assessment Cervical / Trunk Assessment: Other exceptions Cervical / Trunk Exceptions: obesity   Communication Communication Communication: No difficulties   Cognition Arousal/Alertness: Awake/alert Behavior During Therapy: Anxious Overall Cognitive Status: Within Functional Limits for tasks assessed                                 General Comments: requires cues for breathing with anxiety - better if she can mentally prepare   General Comments       Exercises Exercises: Other exercises Other Exercises Other Exercises: shoulder FF x3 during commercials BUE Other Exercises: Shoulder ABduction x3 during commercials BUE   Shoulder Instructions      Home Living Family/patient expects to be discharged to:: Private residence Living Arrangements: Children(68 year old daughter) Available Help at Discharge: Family;Available PRN/intermittently(has very supportive sister and parents) Type of Home: House Home Access: Stairs to enter [employer] of Steps: 3 Entrance Stairs-Rails: Right Home Layout: One level     Bathroom Shower/Tub: Teacher, early years/pre: Standard Bathroom Accessibility: No   Home Equipment: None   Additional Comments: daughter currently staying with sister, parents 5 min down the road      Prior Functioning/Environment Level of Independence: Independent        Comments: employed as a CMA, enjoys swimming and walking, drives        OT Problem List: Decreased strength;Decreased range of motion;Decreased activity tolerance;Impaired balance (sitting and/or standing);Decreased knowledge of use of DME or AE;Decreased knowledge of precautions;Cardiopulmonary status limiting activity;Obesity;Impaired UE functional use      OT Treatment/Interventions: Self-care/ADL training;Therapeutic exercise;Energy conservation;DME and/or AE  instruction;Therapeutic activities;Patient/family education;Balance training    OT Goals(Current goals can be found in the care plan section) Acute Rehab OT Goals Patient Stated Goal: "to get home and be able to take care of myself" OT Goal Formulation: With patient Time For Goal Achievement: 03/01/19 Potential to Achieve Goals: Good ADL Goals Pt Will Perform Grooming: with modified independence;standing Pt Will Perform Upper Body Dressing: with modified independence;sitting Pt Will Perform Lower Body Dressing: with modified independence;sit to/from stand Pt Will Transfer to Toilet: with modified independence;ambulating Pt Will Perform Toileting - Clothing Manipulation and hygiene: with modified independence;sit to/from stand Pt/caregiver will Perform Home Exercise Program: Both right and left upper extremity;With theraband;Independently;With written HEP provided Additional ADL Goal #1: Pt will recall 3 strategies for conserving energy during ADL at independent level  OT Frequency: Min 3X/week   Barriers to D/C:            Co-evaluation              AM-PAC OT "6 Clicks" Daily Activity     Outcome Measure Help from another person eating meals?: None Help from another person taking care of personal grooming?: A Little Help from another person toileting, which includes using toliet, bedpan, or urinal?: A Little Help from another person bathing (including washing, rinsing, drying)?: A Lot Help from another  person to put on and taking off regular upper body clothing?: A Little Help from another person to put on and taking off regular lower body clothing?: A Lot 6 Click Score: 17   End of Session Equipment Utilized During Treatment: Oxygen(11-15L via HFNC with humidifier) Nurse Communication: Mobility status  Activity Tolerance: Other (comment)(limited by respiratory status and anxiety) Patient left: in bed;with call bell/phone within reach  OT Visit Diagnosis: Unsteadiness on  feet (R26.81);Other abnormalities of gait and mobility (R26.89);Muscle weakness (generalized) (M62.81)                Time: 0929-5747 OT Time Calculation (min): 60 min Charges:  OT General Charges $OT Visit: 1 Visit OT Evaluation $OT Eval Moderate Complexity: 1 Mod OT Treatments $Self Care/Home Management : 23-37 mins $Therapeutic Activity: 8-22 mins  Hulda Humphrey OTR/L Acute Rehabilitation Services Pager: [phone] Office: [place] 02/15/2019, 11:41 AM

## 2019-02-15 NOTE — Progress Notes (Signed)
PROGRESS NOTE  Paula King  OZD:[id number] DOB: Apr 28, 1981 DOA: 02/11/2019 PCP: Alycia Rossetti, MD   Brief Narrative: Paula King is a 38 y.o. female with a history of Cushing's disease and morbid obesity who presented to [facility] with complaints of shortness of breath and fever for 3 days.  She tested positive for SARS-CoV-2 2 days prior to her presentation.  Review of systems was also remarkable for loss of taste and some loose stool.  In the emergency room she was noted to have patchy bilateral airspace opacities consistent with COVID pneumonia, and she suffered with hypoxia requiring supplemental oxygen. She was admitted to [facility], started on remdesivir and steroids. With worsening hypoxia, after discussion of its off-label nature, tocilizumab was given on 8/18.   Assessment & Plan: Principal Problem:   COVID-19 virus infection Active Problems:   Acute respiratory failure with hypoxia (HCC)   Hypokalemia   Abnormal liver function  Acute hypoxic respiratory failure due to covid-19 pneumonia:  - Continues to require high levels of supplemental oxygen.  - Continue remdesivir 8/16 - 8/20.  - Continue steroids.  - Reviewed frequent IS and proning - s/p tocilizumab x1 8/28, monitor LFTs and for secondary infections.  - Follow inflammatory markers. Ferritin normalized, D-dimer wnl, CRP showing sustained improvement.  Morbid obesity:  - Weight loss recommended long term  Steroid-induced hyperglycemia:  - Check HbA1c - Sensitive SSI to facilitate optimal glucose control.  Elevated LFTs: Suspect an element of hepatic steatosis, possibly irritated by viral infection. Mild and stable, can continue remdesivir.   Cushing's disease: Not on chronic therapy.   Hypokalemia: Resolved.   DVT prophylaxis: Lovenox Code Status: Full Family Communication: Spoke with sister yesterday afternoon, will call again today between 4pm-5pm per her request today. Disposition Plan: Uncertain, will  require improvement in hypoxia. Possibly DC in 72+ hours.  Consultants:   None  Procedures:   None  Antimicrobials:  Remdesivir   Subjective: Feels less weak today than previously, was able to get to bedside commode without assistance overnight, but required 13L O2 due to subjective dyspnea that worsened last night. Has never had sleep study. No chest pain, willing to get up with therapy today.   Objective: Vitals:   02/15/19 0006 02/15/19 0008 02/15/19 0400 02/15/19 0809  BP: 116/75  (!) 128/96 116/76  Pulse: (!) 55  74 66  Resp: (!) 22 (!) 25 (!) 24 (!) 40  Temp: (!) 97.4 F (36.3 C)  97.9 F (36.6 C) 98 F (36.7 C)  TempSrc: Oral  Oral Oral  SpO2: 95% 91% 91% 90%  Weight:      Height:        Intake/Output Summary (Last 24 hours) at 02/15/2019 1001 Last data filed at 02/15/2019 0500 Gross per 24 hour  Intake 640 ml  Output 550 ml  Net 90 ml   Filed Weights   02/11/19 0327 02/11/19 1720  Weight: 120.2 kg 120.2 kg   Gen: 38 y.o. female in no distress. Cushingoid habitus. Pulm: Nonlabored breathing HFNC, dyspnea/tachypnea worsens with prolonged speaking. Crackles stable bilaterally. CV: Regular rate and rhythm. No murmur, rub, or gallop. No JVD, no dependent edema. GI: Abdomen soft, non-tender, non-distended, with normoactive bowel sounds.  Ext: Warm, no deformities Skin: No rashes, lesions or ulcers on visualized skin. Neuro: Alert and oriented. No focal neurological deficits. Psych: Judgement and insight appear fair. Mood euthymic & affect congruent. Behavior is appropriate.    Data Reviewed: I have personally reviewed following labs  and imaging studies  CBC: Recent Labs  Lab 02/11/19 0307 02/11/19 0525 02/12/19 0255 02/13/19 0400 02/14/19 0545 02/15/19 0240  WBC 6.2  --  7.7 6.4 6.7 8.1  NEUTROABS 4.3  --  6.0 4.6 4.6 6.4  HGB 14.8 14.0 13.3 13.0 13.6 13.2  HCT 44.7  --  41.2 40.9 42.5 40.9  MCV 92.4  --  94.7 95.8 95.5 95.3  PLT 232  --  261 302  356 347   Basic Metabolic Panel: Recent Labs  Lab 02/11/19 0307 02/12/19 0255 02/13/19 0400 02/14/19 0545 02/15/19 0240  NA 137 142 144 143 141  K 3.1* 4.0 3.6 3.8 3.9  CL 102 104 104 106 105  CO2 24 26 29 26 27   GLUCOSE 130* 121* 113* 119* 176*  BUN 12 14 18 20  21*  CREATININE 1.07* 0.78 0.83 0.94 0.85  CALCIUM 8.5* 8.4* 8.6* 8.7* 8.6*   GFR: Estimated Creatinine Clearance: 114.6 mL/min (by C-G formula based on SCr of 0.85 mg/dL). Liver Function Tests: Recent Labs  Lab 02/11/19 0307 02/12/19 0255 02/13/19 0400 02/14/19 0545 02/15/19 0240  AST 100* 77* 42* 42* 85*  ALT 88* 75* 57* 51* 85*  ALKPHOS 84 73 62 71 66  BILITOT 1.0 0.6 0.6 0.7 0.6  PROT 8.1 7.4 7.6 7.3 7.0  ALBUMIN 3.8 3.3* 3.2* 3.1* 3.0*   No results for input(s): LIPASE, AMYLASE in the last 168 hours. No results for input(s): AMMONIA in the last 168 hours. Coagulation Profile: No results for input(s): INR, PROTIME in the last 168 hours. Cardiac Enzymes: Recent Labs  Lab 02/11/19 0525  CKTOTAL 79  CKMB 0.4*   BNP (last 3 results) No results for input(s): PROBNP in the last 8760 hours. HbA1C: No results for input(s): HGBA1C in the last 72 hours. CBG: No results for input(s): GLUCAP in the last 168 hours. Lipid Profile: No results for input(s): CHOL, HDL, LDLCALC, TRIG, CHOLHDL, LDLDIRECT in the last 72 hours. Thyroid Function Tests: No results for input(s): TSH, T4TOTAL, FREET4, T3FREE, THYROIDAB in the last 72 hours. Anemia Panel: Recent Labs    02/13/19 0400 02/14/19 0545  FERRITIN 341* 265   Urine analysis:    Component Value Date/Time   COLORURINE YELLOW 08/09/2014 1405   APPEARANCEUR CLOUDY (A) 08/09/2014 1405   LABSPEC 1.025 04/18/2017 0939   PHURINE 5.0 04/18/2017 0939   GLUCOSEU NEGATIVE 04/18/2017 0939   HGBUR NEGATIVE 04/18/2017 0939   BILIRUBINUR Negative 08/21/2017 [place] 04/18/2017 0939   PROTEINUR Negative 08/21/2017 1227   PROTEINUR NEGATIVE  04/18/2017 0939   UROBILINOGEN 0.2 08/21/2017 1227   UROBILINOGEN 0.2 04/18/2017 0939   NITRITE Negative 08/21/2017 1227   NITRITE NEGATIVE 04/18/2017 0939   LEUKOCYTESUR Negative 08/21/2017 1227   Recent Results (from the past 240 hour(s))  SARS Coronavirus 2 [facility] order, Performed in [facility] hospital lab) Nasopharyngeal Nasopharyngeal Swab     Status: Abnormal   Collection Time: 02/11/19  3:47 AM   Specimen: Nasopharyngeal Swab  Result Value Ref Range Status   SARS Coronavirus 2 POSITIVE (A) NEGATIVE Final    Comment: CRITICAL RESULT CALLED TO, READ BACK BY AND VERIFIED WITH: DOSS,M @ 0448 ON 02/11/19 BY JUW Performed at [facility], [address], [place], [place] [address]   Blood Culture (routine x 2)     Status: None (Preliminary result)   Collection Time: 02/11/19  5:25 AM   Specimen: Left Antecubital; Blood  Result Value Ref Range Status   Specimen Description  Final    LEFT ANTECUBITAL BOTTLES DRAWN AEROBIC AND ANAEROBIC   Special Requests Blood Culture adequate volume  Final   Culture   Final    NO GROWTH 4 DAYS Performed at [facility], [address] [place], [place] [address]    Report Status PENDING  Incomplete  Blood Culture (routine x 2)     Status: None (Preliminary result)   Collection Time: 02/11/19  5:25 AM   Specimen: BLOOD LEFT HAND  Result Value Ref Range Status   Specimen Description BLOOD LEFT HAND BOTTLES DRAWN AEROBIC ONLY  Final   Special Requests Blood Culture adequate volume  Final   Culture   Final    NO GROWTH 4 DAYS Performed at [facility], [address] [place],  [address]    Report Status PENDING  Incomplete      Radiology Studies: No results found.  Scheduled Meds: . enoxaparin (LOVENOX) injection  60 mg Subcutaneous Q24H  . loratadine  10 mg Oral Daily  . methylPREDNISolone (SOLU-MEDROL) injection  60 mg Intravenous Q8H  . sodium chloride flush  3 mL Intravenous Q12H  . vitamin B-12  1,000 mcg Oral Daily  .  cholecalciferol  1,000 Units Oral Daily   Continuous Infusions: . sodium chloride    . remdesivir 100 mg in NS 250 mL 100 mg (02/14/19 1446)     LOS: 4 days   Time spent: 35 minutes.  Patrecia Pour, MD [facility] [email or web address] Password [email or web address] 02/15/2019, 10:01 AM

## 2019-02-15 NOTE — Evaluation (Signed)
Physical Therapy Evaluation Patient Details Name: Paula King MRN: [id number] DOB: 01-25-1981 Today's Date: 02/15/2019   History of Present Illness  Paula King is a 38 y.o. female with a history of Cushing's disease and morbid obesity who presented to the [facility] with complaints of shortness of breath and fever for 3 days.  She tested positive for SARS-CoV-2 2 days prior to her presentation, positive opacities consistent with Covid pneumonia  Clinical Impression  The patient is very motivated. Pt. Started on 12 L HFNC, SPO@ 92%, with activity to BSC, SPO2 droppng to 85%, RR34. INcr to 15 L for ambulating 5' x 2 with SPO2 down to 86%. After recovery time, returned to 12 L w/SPO2 returning to >90%. Pt admitted with above diagnosis.  Pt currently with functional limitations due to the deficits listed below (see PT Problem List). Pt will benefit from skilled PT to increase their independence and safety with mobility to allow discharge to the venue listed below.       Follow Up Recommendations No PT follow up    Equipment Recommendations  None recommended by PT    Recommendations for Other Services       Precautions / Restrictions Precautions Precaution Comments: monitor SPO2, on HFNC      Mobility  Bed Mobility Overal bed mobility: Modified Independent                Transfers Overall transfer level: Needs assistance Equipment used: 4-wheeled walker Transfers: Sit to/from Stand;Stand Pivot Transfers Sit to Stand: Supervision Stand pivot transfers: Supervision       General transfer comment: to [facility], required increased O2 from 12 to 15L with activity and cues for breathing,  Ambulation/Gait Ambulation/Gait assistance: Min guard Gait Distance (Feet): 5 Feet Assistive device: 4-wheeled walker Gait Pattern/deviations: Step-to pattern Gait velocity: decr   General Gait Details: short distandce, turned and sat on rollator to rest then back 5' to bed.  Stairs             Wheelchair Mobility    Modified Rankin (Stroke Patients Only)       Balance Overall balance assessment: Mild deficits observed, not formally tested                                           Pertinent Vitals/Pain      Home Living Family/patient expects to be discharged to:: Private residence Living Arrangements: Children Available Help at Discharge: Family;Available PRN/intermittently Type of Home: House Home Access: Stairs to enter Entrance Stairs-Rails: Right Entrance Stairs-Number of Steps: 3 Home Layout: One level Home Equipment: None Additional Comments: plans to go to sister's home    Prior Function Level of Independence: Independent         Comments: employed as a CMA, enjoys swimming and walking, drives     Hand Dominance        Extremity/Trunk Assessment   Upper Extremity Assessment Upper Extremity Assessment: Generalized weakness    Lower Extremity Assessment Lower Extremity Assessment: Generalized weakness    Cervical / Trunk Assessment Cervical / Trunk Assessment: Normal  Communication   Communication: No difficulties  Cognition Arousal/Alertness: Awake/alert Behavior During Therapy: Anxious Overall Cognitive Status: Within Functional Limits for tasks assessed  General Comments: requires cues for breathing with anxiety - better if she can mentally prepare      General Comments      Exercises General Exercises - Lower Extremity Ankle Circles/Pumps: AROM;Both;10 reps Quad Sets: AROM;Both;10 reps   Assessment/Plan    PT Assessment Patient needs continued PT services  PT Problem List Decreased strength;Decreased mobility;Decreased activity tolerance;Cardiopulmonary status limiting activity       PT Treatment Interventions DME instruction;Therapeutic activities;Gait training;Functional mobility training;Stair training;Patient/family education;Therapeutic  exercise    PT Goals (Current goals can be found in the Care Plan section)  Acute Rehab PT Goals Patient Stated Goal: "to get home and be able to take care of myself" PT Goal Formulation: With patient Time For Goal Achievement: 03/01/19 Potential to Achieve Goals: Good    Frequency Min 3X/week   Barriers to discharge        Co-evaluation               AM-PAC PT "6 Clicks" Mobility  Outcome Measure Help needed turning from your back to your side while in a flat bed without using bedrails?: None Help needed moving from lying on your back to sitting on the side of a flat bed without using bedrails?: None Help needed moving to and from a bed to a chair (including a wheelchair)?: A Little Help needed standing up from a chair using your arms (e.g., wheelchair or bedside chair)?: A Little Help needed to walk in hospital room?: A Little Help needed climbing 3-5 steps with a railing? : A Lot 6 Click Score: 19    End of Session Equipment Utilized During Treatment: Oxygen Activity Tolerance: Treatment limited secondary to medical complications (Comment) Patient left: in bed;with call bell/phone within reach Nurse Communication: Mobility status PT Visit Diagnosis: Difficulty in walking, not elsewhere classified (R26.2)    Time: 0712-1975 PT Time Calculation (min) (ACUTE ONLY): 36 min   Charges:   PT Evaluation $PT Eval Moderate Complexity: 1 Mod PT Treatments $Gait Training: 8-22 mins        Kathryn  Office [phone]   Claretha Cooper 02/15/2019, 5:01 PM

## 2019-02-15 NOTE — Plan of Care (Signed)
Pt was increased again to 15L HFNC due to subjective feeling of shortness of breath. Pt desats with minimal activity and feels very anxious about her respiratory status. States, however, that she overall is "having a good night" in a respiratory sense.

## 2019-02-16 LAB — CULTURE, BLOOD (ROUTINE X 2)
Culture: NO GROWTH
Culture: NO GROWTH
Special Requests: ADEQUATE
Special Requests: ADEQUATE

## 2019-02-16 LAB — GLUCOSE, CAPILLARY
Glucose-Capillary: 165 mg/dL — ABNORMAL HIGH (ref 70–99)
Glucose-Capillary: 140 mg/dL — ABNORMAL HIGH (ref 70–99)
Glucose-Capillary: 179 mg/dL — ABNORMAL HIGH (ref 70–99)
Glucose-Capillary: 126 mg/dL — ABNORMAL HIGH (ref 70–99)
Glucose-Capillary: 163 mg/dL — ABNORMAL HIGH (ref 70–99)

## 2019-02-16 LAB — COMPREHENSIVE METABOLIC PANEL
ALT: 72 U/L — ABNORMAL HIGH (ref 0–44)
AST: 43 U/L — ABNORMAL HIGH (ref 15–41)
Albumin: 2.9 g/dL — ABNORMAL LOW (ref 3.5–5.0)
Alkaline Phosphatase: 67 U/L (ref 38–126)
Anion gap: 9 (ref 5–15)
BUN: 24 mg/dL — ABNORMAL HIGH (ref 6–20)
CO2: 27 mmol/L (ref 22–32)
Calcium: 8.5 mg/dL — ABNORMAL LOW (ref 8.9–10.3)
Chloride: 105 mmol/L (ref 98–111)
Creatinine, Ser: 0.85 mg/dL (ref 0.44–1.00)
GFR calc Af Amer: >60 mL/min (ref >60)
GFR calc non Af Amer: >60 mL/min (ref >60)
Glucose, Bld: 169 mg/dL — ABNORMAL HIGH (ref 70–99)
Potassium: 3.8 mmol/L (ref 3.5–5.1)
Sodium: 141 mmol/L (ref 135–145)
Total Bilirubin: 0.4 mg/dL (ref 0.3–1.2)
Total Protein: 6.5 g/dL (ref 6.5–8.1)

## 2019-02-16 LAB — CBC WITH DIFFERENTIAL/PLATELET
Abs Immature Granulocytes: 0.47 10*3/uL — ABNORMAL HIGH (ref 0.00–0.07)
Basophils Absolute: 0.1 10*3/uL (ref 0.0–0.1)
Basophils Relative: 1 %
Eosinophils Absolute: 0 10*3/uL (ref 0.0–0.5)
Eosinophils Relative: 0 %
HCT: 42 % (ref 36.0–46.0)
Hemoglobin: 13.6 g/dL (ref 12.0–15.0)
Immature Granulocytes: 5 %
Lymphocytes Relative: 12 %
Lymphs Abs: 1.2 10*3/uL (ref 0.7–4.0)
MCH: 30.7 pg (ref 26.0–34.0)
MCHC: 32.4 g/dL (ref 30.0–36.0)
MCV: 94.8 fL (ref 80.0–100.0)
Monocytes Absolute: 0.7 10*3/uL (ref 0.1–1.0)
Monocytes Relative: 7 %
Neutro Abs: 7.9 10*3/uL — ABNORMAL HIGH (ref 1.7–7.7)
Neutrophils Relative %: 75 %
Platelets: 386 10*3/uL (ref 150–400)
RBC: 4.43 MIL/uL (ref 3.87–5.11)
RDW: 13.2 % (ref 11.5–15.5)
WBC: 10.3 10*3/uL (ref 4.0–10.5)
nRBC: 0 % (ref 0.0–0.2)

## 2019-02-16 LAB — C-REACTIVE PROTEIN: CRP: 1.9 mg/dL — ABNORMAL HIGH (ref <1.0)

## 2019-02-16 LAB — HEMOGLOBIN A1C
Hgb A1c MFr Bld: 5.9 % — ABNORMAL HIGH (ref 4.8–5.6)
Mean Plasma Glucose: 122.63 mg/dL

## 2019-02-16 NOTE — Progress Notes (Signed)
Patient deferred family update phone call. 

## 2019-02-16 NOTE — Progress Notes (Signed)
PROGRESS NOTE  Paula King  [id number] DOB: 17-Jun-1981 DOA: 02/11/2019 PCP: Alycia Rossetti, MD   Brief Narrative: Paula King is a 38 y.o. female with a history of Cushing's disease and morbid obesity who presented to [facility] with complaints of shortness of breath and fever for 3 days.  She tested positive for SARS-CoV-2 2 days prior to her presentation.  Review of systems was also remarkable for loss of taste and some loose stool.  In the emergency room she was noted to have patchy bilateral airspace opacities consistent with COVID pneumonia, and she suffered with hypoxia requiring supplemental oxygen. She was admitted to [facility], started on remdesivir and steroids. With worsening hypoxia, after discussion of its off-label nature, tocilizumab was given on 8/18.   Assessment & Plan: Principal Problem:   COVID-19 virus infection Active Problems:   Acute respiratory failure with hypoxia (HCC)   Hypokalemia   Abnormal liver function  Acute hypoxic respiratory failure due to covid-19 pneumonia:  - Continues to require high levels of supplemental oxygen. Prone as able, IS, PT/OT, mobilize as much as possible. Permissive exertional hypoxia in the absence of respiratory distress..  - Continue remdesivir 8/16 - 8/20.  - Continue steroids.  - s/p tocilizumab x1 8/28, monitor LFTs and for secondary infections.  - Follow inflammatory markers. Ferritin normalized, D-dimer wnl, CRP showing sustained improvement, again today down to 1.9.  Morbid obesity:  - Weight loss recommended long term  Facial rash: Erythematous without papules, nonpruritic, not associated with new medication. Has h/o eczema. Distribution not consistent with detergent or other irritant/allergic exposure. No signs or symptoms of systemic allergic reaction. Eo count is ~0 and no angioedema. Since resolved.  - Can give po antihistamine if recurs. Also asked her to take picture if recurs.   Steroid-induced hyperglycemia,  prediabetes: HbA1c is 5.9%.  - Sensitive SSI to facilitate optimal glucose control though she has remained at inpatient goal. If no insulin required over next 24hrs can DC CBG checks.  Elevated LFTs: Suspect an element of hepatic steatosis, possibly irritated by viral infection. Mild and stable/improved today, can continue remdesivir.   Cushing's disease: Not on chronic therapy.   Hypokalemia: Resolved.   DVT prophylaxis: Lovenox Code Status: Full Family Communication: Speaking with sister every afternoon.   Disposition Plan: Uncertain, will require improvement in hypoxia to reasonable levels.  Consultants:   None  Procedures:   None  Antimicrobials:  Remdesivir 8/16 - 8/20  Subjective: Tired this morning, but didn't sleep well. Worked with therapy yesterday, still required >10L O2 and severely short of breath with exertion, but she's able to get on/off BSC independently which is an improvement. Had facial rash that was not bothersome yesterday afternoon noted by RN extending to chest/torso which has since improved spontaneously. Eyes still feel slightly burning but not itchy.   Objective: Vitals:   02/15/19 1146 02/15/19 1700 02/15/19 1955 02/16/19 0615  BP: 112/85  116/68   Pulse: 62 63 92   Resp: (!) 24 (!) 34 (!) 31   Temp: 98.3 F (36.8 C) 98.7 F (37.1 C) 98.7 F (37.1 C) 98.5 F (36.9 C)  TempSrc: Oral Oral Oral Oral  SpO2: 94% 93% 90%   Weight:      Height:       No intake or output data in the 24 hours ending 02/16/19 1122 Filed Weights   02/11/19 0327 02/11/19 1720  Weight: 120.2 kg 120.2 kg   Gen: 38 y.o. female in no distress  Pulm: Nonlabored tachypnea on HFNC. Crackles improved, distant bilaterally. CV: Regular rate and rhythm. No murmur, rub, or gallop. No JVD, no dependent edema. GI: Abdomen soft, non-tender, non-distended, with normoactive bowel sounds.  Ext: Warm, no deformities Skin: Facial erythema noted with no mucosal involvement or  conjunctival changes.  Neuro: Alert and oriented. No focal neurological deficits. Psych: Judgement and insight appear fair. Mood euthymic & affect congruent. Behavior is appropriate.    Data Reviewed: I have personally reviewed following labs and imaging studies  CBC: Recent Labs  Lab 02/12/19 0255 02/13/19 0400 02/14/19 0545 02/15/19 0240 02/16/19 0100  WBC 7.7 6.4 6.7 8.1 10.3  NEUTROABS 6.0 4.6 4.6 6.4 7.9*  HGB 13.3 13.0 13.6 13.2 13.6  HCT 41.2 40.9 42.5 40.9 42.0  MCV 94.7 95.8 95.5 95.3 94.8  PLT 261 302 356 378 [id number]   Basic Metabolic Panel: Recent Labs  Lab 02/12/19 0255 02/13/19 0400 02/14/19 0545 02/15/19 0240 02/16/19 0100  NA 142 144 143 141 141  K 4.0 3.6 3.8 3.9 3.8  CL 104 104 106 105 105  CO2 26 29 26 27 27   GLUCOSE 121* 113* 119* 176* 169*  BUN 14 18 20  21* 24*  CREATININE 0.78 0.83 0.94 0.85 0.85  CALCIUM 8.4* 8.6* 8.7* 8.6* 8.5*   GFR: Estimated Creatinine Clearance: 114.6 mL/min (by C-G formula based on SCr of 0.85 mg/dL). Liver Function Tests: Recent Labs  Lab 02/12/19 0255 02/13/19 0400 02/14/19 0545 02/15/19 0240 02/16/19 0100  AST 77* 42* 42* 85* 43*  ALT 75* 57* 51* 85* 72*  ALKPHOS 73 62 71 66 67  BILITOT 0.6 0.6 0.7 0.6 0.4  PROT 7.4 7.6 7.3 7.0 6.5  ALBUMIN 3.3* 3.2* 3.1* 3.0* 2.9*   No results for input(s): LIPASE, AMYLASE in the last 168 hours. No results for input(s): AMMONIA in the last 168 hours. Coagulation Profile: No results for input(s): INR, PROTIME in the last 168 hours. Cardiac Enzymes: Recent Labs  Lab 02/11/19 0525  CKTOTAL 79  CKMB 0.4*   BNP (last 3 results) No results for input(s): PROBNP in the last 8760 hours. HbA1C: Recent Labs    02/16/19 0105  HGBA1C 5.9*   CBG: Recent Labs  Lab 02/15/19 1144 02/15/19 1649 02/15/19 2052 02/16/19 0752  GLUCAP 150* 110* 165* 140*   Lipid Profile: No results for input(s): CHOL, HDL, LDLCALC, TRIG, CHOLHDL, LDLDIRECT in the last 72 hours. Thyroid Function  Tests: No results for input(s): TSH, T4TOTAL, FREET4, T3FREE, THYROIDAB in the last 72 hours. Anemia Panel: Recent Labs    02/14/19 0545  FERRITIN 265   Urine analysis:    Component Value Date/Time   COLORURINE YELLOW 08/09/2014 1405   APPEARANCEUR CLOUDY (A) 08/09/2014 1405   LABSPEC 1.025 04/18/2017 0939   PHURINE 5.0 04/18/2017 0939   GLUCOSEU NEGATIVE 04/18/2017 0939   HGBUR NEGATIVE 04/18/2017 0939   BILIRUBINUR Negative 08/21/2017 [place] 04/18/2017 0939   PROTEINUR Negative 08/21/2017 1227   PROTEINUR NEGATIVE 04/18/2017 0939   UROBILINOGEN 0.2 08/21/2017 1227   UROBILINOGEN 0.2 04/18/2017 0939   NITRITE Negative 08/21/2017 1227   NITRITE NEGATIVE 04/18/2017 0939   LEUKOCYTESUR Negative 08/21/2017 1227   Recent Results (from the past 240 hour(s))  SARS Coronavirus 2 [facility] order, Performed in [facility] hospital lab) Nasopharyngeal Nasopharyngeal Swab     Status: Abnormal   Collection Time: 02/11/19  3:47 AM   Specimen: Nasopharyngeal Swab  Result Value Ref Range Status   SARS Coronavirus 2 POSITIVE (A)  NEGATIVE Final    Comment: CRITICAL RESULT CALLED TO, READ BACK BY AND VERIFIED WITH: DOSS,M @ 0448 ON 02/11/19 BY JUW Performed at [facility], [address], [place], [place] [address]   Blood Culture (routine x 2)     Status: None   Collection Time: 02/11/19  5:25 AM   Specimen: Left Antecubital; Blood  Result Value Ref Range Status   Specimen Description   Final    LEFT ANTECUBITAL BOTTLES DRAWN AEROBIC AND ANAEROBIC   Special Requests Blood Culture adequate volume  Final   Culture   Final    NO GROWTH 5 DAYS Performed at [facility], [address], [place], [place] [address]    Report Status 02/16/2019 FINAL  Final  Blood Culture (routine x 2)     Status: None   Collection Time: 02/11/19  5:25 AM   Specimen: BLOOD LEFT HAND  Result Value Ref Range Status   Specimen Description BLOOD LEFT HAND BOTTLES DRAWN AEROBIC ONLY  Final    Special Requests Blood Culture adequate volume  Final   Culture   Final    NO GROWTH 5 DAYS Performed at [facility], [address] [place], [place] [address]    Report Status 02/16/2019 FINAL  Final      Radiology Studies: No results found.  Scheduled Meds: . enoxaparin (LOVENOX) injection  60 mg Subcutaneous Q24H  . insulin aspart  0-5 Units Subcutaneous QHS  . insulin aspart  0-9 Units Subcutaneous TID WC  . loratadine  10 mg Oral Daily  . methylPREDNISolone (SOLU-MEDROL) injection  60 mg Intravenous Q8H  . sodium chloride flush  3 mL Intravenous Q12H  . vitamin B-12  1,000 mcg Oral Daily  . cholecalciferol  1,000 Units Oral Daily   Continuous Infusions: . sodium chloride       LOS: 5 days   Time spent: 35 minutes.  Patrecia Pour, MD [facility] [email or web address] Password [email or web address] 02/16/2019, 11:22 AM

## 2019-02-16 NOTE — Progress Notes (Signed)
Physical Therapy Treatment Patient Details Name: Paula King MRN: [id number] DOB: August 16, 1980 Today's Date: 02/16/2019    History of Present Illness Paula King is a 38 y.o. female with a history of Cushing's disease and morbid obesity who presented to the [facility] with complaints of shortness of breath and fever for 3 days.  She tested positive for SARS-CoV-2 2 days prior to her presentation, positive opacities consistent with Covid pneumonia    PT Comments    The ptient tolerated  A short 8' walk on 10 L HFNC, lowest SPO2 86%, pt recovered to 94% in 4 minutes. Patient much less anxious about mobilizing today.  Continue PT for increasing activity.    Follow Up Recommendations  No PT follow up     Equipment Recommendations  None recommended by PT    Recommendations for Other Services       Precautions / Restrictions Precautions Precaution Comments: monitor SPO2, on HFNC    Mobility  Bed Mobility Overal bed mobility: Independent             General bed mobility comments: increased time and effort  Transfers Overall transfer level: Needs assistance   Transfers: Sit to/from Stand;Stand Pivot Transfers Sit to Stand: Supervision Stand pivot transfers: Supervision       General transfer comment: to BSC, on 10 L HFNC,  Ambulation/Gait Ambulation/Gait assistance: Min guard Gait Distance (Feet): 8 Feet Assistive device: None Gait Pattern/deviations: Step-to pattern     General Gait Details: very slow and guarded   Stairs             Wheelchair Mobility    Modified Rankin (Stroke Patients Only)       Balance                                            Cognition Arousal/Alertness: Awake/alert                                     General Comments: less anxious      Exercises      General Comments        Pertinent Vitals/Pain      Home Living                      Prior Function           PT Goals (current goals can now be found in the care plan section)      Frequency    Min 3X/week      PT Plan Current plan remains appropriate    Co-evaluation              AM-PAC PT "6 Clicks" Mobility   Outcome Measure  Help needed turning from your back to your side while in a flat bed without using bedrails?: None Help needed moving from lying on your back to sitting on the side of a flat bed without using bedrails?: None Help needed moving to and from a bed to a chair (including a wheelchair)?: A Little Help needed standing up from a chair using your arms (e.g., wheelchair or bedside chair)?: A Little Help needed to walk in hospital room?: A Little Help needed climbing 3-5 steps with a railing? : A Lot 6 Click Score: 19  End of Session Equipment Utilized During Treatment: Oxygen Activity Tolerance: Patient tolerated treatment well Patient left: in chair;with call bell/phone within reach Nurse Communication: Mobility status PT Visit Diagnosis: Difficulty in walking, not elsewhere classified (R26.2)     Time: [id number] PT Time Calculation (min) (ACUTE ONLY): 26 min  Charges:  $Gait Training: 8-22 mins $Self Care/Home Management: Willow Ranelle Auker Pager [phone] Office [phone]    Claretha Cooper 02/16/2019, 3:13 PM

## 2019-02-16 NOTE — Plan of Care (Signed)
Will continue with plan of care. 

## 2019-02-17 LAB — GLUCOSE, CAPILLARY
Glucose-Capillary: 137 mg/dL — ABNORMAL HIGH (ref 70–99)
Glucose-Capillary: 146 mg/dL — ABNORMAL HIGH (ref 70–99)
Glucose-Capillary: 200 mg/dL — ABNORMAL HIGH (ref 70–99)
Glucose-Capillary: 231 mg/dL — ABNORMAL HIGH (ref 70–99)

## 2019-02-17 LAB — CBC WITH DIFFERENTIAL/PLATELET
Abs Immature Granulocytes: 0.64 10*3/uL — ABNORMAL HIGH (ref 0.00–0.07)
Basophils Absolute: 0.1 10*3/uL (ref 0.0–0.1)
Basophils Relative: 1 %
Eosinophils Absolute: 0 10*3/uL (ref 0.0–0.5)
Eosinophils Relative: 0 %
HCT: 42.2 % (ref 36.0–46.0)
Hemoglobin: 13.7 g/dL (ref 12.0–15.0)
Immature Granulocytes: 5 %
Lymphocytes Relative: 10 %
Lymphs Abs: 1.2 10*3/uL (ref 0.7–4.0)
MCH: 30.6 pg (ref 26.0–34.0)
MCHC: 32.5 g/dL (ref 30.0–36.0)
MCV: 94.4 fL (ref 80.0–100.0)
Monocytes Absolute: 0.7 10*3/uL (ref 0.1–1.0)
Monocytes Relative: 6 %
Neutro Abs: 9.3 10*3/uL — ABNORMAL HIGH (ref 1.7–7.7)
Neutrophils Relative %: 78 %
Platelets: 407 10*3/uL — ABNORMAL HIGH (ref 150–400)
RBC: 4.47 MIL/uL (ref 3.87–5.11)
RDW: 13.2 % (ref 11.5–15.5)
WBC: 11.9 10*3/uL — ABNORMAL HIGH (ref 4.0–10.5)
nRBC: 0 % (ref 0.0–0.2)

## 2019-02-17 LAB — COMPREHENSIVE METABOLIC PANEL
ALT: 53 U/L — ABNORMAL HIGH (ref 0–44)
AST: 23 U/L (ref 15–41)
Albumin: 2.9 g/dL — ABNORMAL LOW (ref 3.5–5.0)
Alkaline Phosphatase: 65 U/L (ref 38–126)
Anion gap: 9 (ref 5–15)
BUN: 25 mg/dL — ABNORMAL HIGH (ref 6–20)
CO2: 25 mmol/L (ref 22–32)
Calcium: 8.2 mg/dL — ABNORMAL LOW (ref 8.9–10.3)
Chloride: 105 mmol/L (ref 98–111)
Creatinine, Ser: 0.9 mg/dL (ref 0.44–1.00)
GFR calc Af Amer: >60 mL/min (ref >60)
GFR calc non Af Amer: >60 mL/min (ref >60)
Glucose, Bld: 210 mg/dL — ABNORMAL HIGH (ref 70–99)
Potassium: 3.7 mmol/L (ref 3.5–5.1)
Sodium: 139 mmol/L (ref 135–145)
Total Bilirubin: 0.6 mg/dL (ref 0.3–1.2)
Total Protein: 6.5 g/dL (ref 6.5–8.1)

## 2019-02-17 LAB — C-REACTIVE PROTEIN: CRP: 1.2 mg/dL — ABNORMAL HIGH (ref <1.0)

## 2019-02-17 NOTE — Progress Notes (Signed)
PROGRESS NOTE  Paula King  [id number] DOB: 10-Apr-1981 DOA: 02/11/2019 PCP: Alycia Rossetti, MD   Brief Narrative: Paula King is a 38 y.o. female with a history of Cushing's disease and morbid obesity who presented to the [facility] with complaints of shortness of breath and fever for 3 days.  She tested positive for SARS-CoV-2 2 days prior to her presentation.  Review of systems was also remarkable for loss of taste and some loose stool.  In the emergency room she was noted to have patchy bilateral airspace opacities consistent with COVID pneumonia, and she suffered with hypoxia requiring supplemental oxygen. She was admitted to [facility], started on remdesivir and steroids. With worsening hypoxia, after discussion of its off-label nature, tocilizumab was given on 8/18.   Assessment & Plan: Principal Problem:   COVID-19 virus infection Active Problems:   Acute respiratory failure with hypoxia (HCC)   Hypokalemia   Abnormal liver function  Acute hypoxic respiratory failure due to covid-19 pneumonia:  - Wean oxygen as able. Mobilize as much as possible. Pt fortunately very motivated, reviewed concept of permissive exertional hypoxia in the absence of respiratory distress.  - Completed remdesivir 8/16 - 8/20.  - Continue steroids, planning 10 days (8/16 - 8/25) - s/p tocilizumab x1 8/28, monitor LFTs and for secondary infections.  - Follow inflammatory markers. Ferritin normalized, D-dimer wnl, CRP showing sustained improvement, again today down to 1.2. Can give lab holiday 8/23  Morbid obesity:  - Weight loss recommended long term  Facial rash: Erythematous without papules, nonpruritic, not associated with new medication. Has h/o eczema. Distribution not consistent with detergent or other irritant/allergic exposure. No signs or symptoms of systemic allergic reaction. Eo count is ~0 and no angioedema. Since resolved.  - Can give po antihistamine if recurs. Also asked her to take picture  if recurs.   Sinus bradycardia: Without pauses or syncope/presyncope. More persistent when sleeping. Not associated with hypotension. Telemetry, which was personally reviewed today, has consistently demonstrated NSR with occasional bradycardia into 50's and rare PVCs with short run of ventricular bigeminy earlier in admission.  - Continue to monitor.  - Avoid AV nodal agents. Formerly on atenolol but not currently.  Steroid-induced hyperglycemia, prediabetes: HbA1c is 5.9%.  - Sensitive SSI to facilitate optimal glucose control. D/w patient  Elevated LFTs: Suspect an element of hepatic steatosis, possibly irritated by viral infection.  - Continues improvement.  Cushing's disease: Not on chronic therapy.   Hypokalemia: Resolved.   DVT prophylaxis: Lovenox Code Status: Full Family Communication: Speaking with sister by phone daily.   Disposition Plan: Uncertain, will require improvement in hypoxia to reasonable levels.  Consultants:   None  Procedures:   None  Antimicrobials:  Remdesivir 8/16 - 8/20  Subjective: Feels much better today, noting incremental improvements for past couple days, but only just this morning feels not short of breath at rest on 6L O2, still moderate-severe dyspnea which is provoked by exertion improved with rest. Had bradycardia last night without dizziness while resting. No recurrence of the rash.   Objective: Vitals:   02/16/19 0615 02/16/19 1955 02/17/19 0354 02/17/19 0800  BP:  119/71 (!) 106/55 114/78  Pulse:  68 85 74  Resp:  (!) 39 (!) 27 (!) 25  Temp: 98.5 F (36.9 C) 98 F (36.7 C) 98.2 F (36.8 C) 98.3 F (36.8 C)  TempSrc: Oral Oral Oral Oral  SpO2:  93% 91% 90%  Weight:      Height:  Intake/Output Summary (Last 24 hours) at 02/17/2019 0918 Last data filed at 02/17/2019 0600 Gross per 24 hour  Intake 600 ml  Output -  Net 600 ml   Filed Weights   02/11/19 0327 02/11/19 1720  Weight: 120.2 kg 120.2 kg   Gen: 38 y.o.  female in no distress Pulm: Nonlabored breathing supplemental oxygen in mid-90%'s on 6L at rest, crackles improving, still distant breath sounds.. CV: Regular rate and rhythm. No murmur, rub, or gallop. No JVD, no dependent edema. GI: Abdomen soft, non-tender, non-distended, with normoactive bowel sounds.  Ext: Warm, no deformities Skin: No rashes, lesions or ulcers on visualized skin. Neuro: Alert and oriented. No focal neurological deficits. Psych: Judgement and insight appear fair. Mood euthymic & affect congruent. Behavior is appropriate.    Data Reviewed: I have personally reviewed following labs and imaging studies  CBC: Recent Labs  Lab 02/13/19 0400 02/14/19 0545 02/15/19 0240 02/16/19 0100 02/17/19 0036  WBC 6.4 6.7 8.1 10.3 11.9*  NEUTROABS 4.6 4.6 6.4 7.9* 9.3*  HGB 13.0 13.6 13.2 13.6 13.7  HCT 40.9 42.5 40.9 42.0 42.2  MCV 95.8 95.5 95.3 94.8 94.4  PLT 302 356 378 386 [id number]   Basic Metabolic Panel: Recent Labs  Lab 02/13/19 0400 02/14/19 0545 02/15/19 0240 02/16/19 0100 02/17/19 0036  NA 144 143 141 141 139  K 3.6 3.8 3.9 3.8 3.7  CL 104 106 105 105 105  CO2 29 26 27 27 25   GLUCOSE 113* 119* 176* 169* 210*  BUN 18 20 21* 24* 25*  CREATININE 0.83 0.94 0.85 0.85 0.90  CALCIUM 8.6* 8.7* 8.6* 8.5* 8.2*   GFR: Estimated Creatinine Clearance: 108.2 mL/min (by C-G formula based on SCr of 0.9 mg/dL). Liver Function Tests: Recent Labs  Lab 02/13/19 0400 02/14/19 0545 02/15/19 0240 02/16/19 0100 02/17/19 0036  AST 42* 42* 85* 43* 23  ALT 57* 51* 85* 72* 53*  ALKPHOS 62 71 66 67 65  BILITOT 0.6 0.7 0.6 0.4 0.6  PROT 7.6 7.3 7.0 6.5 6.5  ALBUMIN 3.2* 3.1* 3.0* 2.9* 2.9*   No results for input(s): LIPASE, AMYLASE in the last 168 hours. No results for input(s): AMMONIA in the last 168 hours. Coagulation Profile: No results for input(s): INR, PROTIME in the last 168 hours. Cardiac Enzymes: Recent Labs  Lab 02/11/19 0525  CKTOTAL 79  CKMB 0.4*   BNP  (last 3 results) No results for input(s): PROBNP in the last 8760 hours. HbA1C: Recent Labs    02/16/19 0105  HGBA1C 5.9*   CBG: Recent Labs  Lab 02/15/19 2052 02/16/19 0752 02/16/19 1145 02/16/19 1613 02/16/19 2052  GLUCAP 165* 140* 179* 126* 163*   Lipid Profile: No results for input(s): CHOL, HDL, LDLCALC, TRIG, CHOLHDL, LDLDIRECT in the last 72 hours. Thyroid Function Tests: No results for input(s): TSH, T4TOTAL, FREET4, T3FREE, THYROIDAB in the last 72 hours. Anemia Panel: No results for input(s): VITAMINB12, FOLATE, FERRITIN, TIBC, IRON, RETICCTPCT in the last 72 hours. Urine analysis:    Component Value Date/Time   COLORURINE YELLOW 08/09/2014 1405   APPEARANCEUR CLOUDY (A) 08/09/2014 1405   LABSPEC 1.025 04/18/2017 0939   PHURINE 5.0 04/18/2017 0939   GLUCOSEU NEGATIVE 04/18/2017 0939   HGBUR NEGATIVE 04/18/2017 0939   BILIRUBINUR Negative 08/21/2017 [place] 04/18/2017 0939   PROTEINUR Negative 08/21/2017 1227   PROTEINUR NEGATIVE 04/18/2017 0939   UROBILINOGEN 0.2 08/21/2017 1227   UROBILINOGEN 0.2 04/18/2017 0939   NITRITE Negative 08/21/2017 1227   NITRITE  NEGATIVE 04/18/2017 0939   LEUKOCYTESUR Negative 08/21/2017 1227   Recent Results (from the past 240 hour(s))  SARS Coronavirus 2 [facility] order, Performed in [facility] hospital lab) Nasopharyngeal Nasopharyngeal Swab     Status: Abnormal   Collection Time: 02/11/19  3:47 AM   Specimen: Nasopharyngeal Swab  Result Value Ref Range Status   SARS Coronavirus 2 POSITIVE (A) NEGATIVE Final    Comment: CRITICAL RESULT CALLED TO, READ BACK BY AND VERIFIED WITH: DOSS,M @ 0448 ON 02/11/19 BY JUW Performed at [facility], [address], [place], [place] [address]   Blood Culture (routine x 2)     Status: None   Collection Time: 02/11/19  5:25 AM   Specimen: Left Antecubital; Blood  Result Value Ref Range Status   Specimen Description   Final    LEFT ANTECUBITAL BOTTLES DRAWN AEROBIC  AND ANAEROBIC   Special Requests Blood Culture adequate volume  Final   Culture   Final    NO GROWTH 5 DAYS Performed at [facility], [address], [place], [place] [address]    Report Status 02/16/2019 FINAL  Final  Blood Culture (routine x 2)     Status: None   Collection Time: 02/11/19  5:25 AM   Specimen: BLOOD LEFT HAND  Result Value Ref Range Status   Specimen Description BLOOD LEFT HAND BOTTLES DRAWN AEROBIC ONLY  Final   Special Requests Blood Culture adequate volume  Final   Culture   Final    NO GROWTH 5 DAYS Performed at [facility], [address], [place], [place] [address]    Report Status 02/16/2019 FINAL  Final      Radiology Studies: No results found.  Scheduled Meds: . cholecalciferol  1,000 Units Oral Daily  . enoxaparin (LOVENOX) injection  60 mg Subcutaneous Q24H  . insulin aspart  0-5 Units Subcutaneous QHS  . insulin aspart  0-9 Units Subcutaneous TID WC  . loratadine  10 mg Oral Daily  . methylPREDNISolone (SOLU-MEDROL) injection  60 mg Intravenous Q8H  . sodium chloride flush  3 mL Intravenous Q12H  . vitamin B-12  1,000 mcg Oral Daily   Continuous Infusions: . sodium chloride       LOS: 6 days   Time spent: 35 minutes.  Patrecia Pour, MD [facility] [email or web address] Password [facility] 02/17/2019, 9:18 AM

## 2019-02-17 NOTE — Progress Notes (Addendum)
Occupational Therapy Treatment Patient Details Name: Paula King MRN: [id number] DOB: 11-06-1980 Today's Date: 02/17/2019    History of present illness Paula King is a 38 y.o. female with a history of Cushing's disease and morbid obesity who presented to the [facility] with complaints of shortness of breath and fever for 3 days.  She tested positive for SARS-CoV-2 2 days prior to her presentation, positive opacities consistent with Covid pneumonia   OT comments  Pt presents supine in bed very pleasant and motivated to work with therapy. Pt completing stand pivot transfer to [facility] and toileting ADL start of session, completing both at supervision level. Following seated rest break pt performing functional mobility a short distance in room (EOB to bathroom doorway) without AD and minguard assist. Pt does fatigue with activity and requires seated rest breaks throughout, but reports feeling an improvement from initial admission; intermittently requires min cues for deep breathing to ensure adequate O2/RR. Pt on 7L HFNC upon arrival to room, requiring 10L to maintain O2 sats >/=90% with activity, and returned to 7L end of session while resting in recliner with O2 sats 93%. Will continue to follow while pt remains acutely admitted to progress her towards her PLOF.   Follow Up Recommendations  No OT follow up;Supervision - Intermittent    Equipment Recommendations  3 in 1 bedside commode          Precautions / Restrictions Precautions Precautions: Fall Precaution Comments: monitor SPO2, on HFNC Restrictions Weight Bearing Restrictions: No       Mobility Bed Mobility Overal bed mobility: Modified Independent             General bed mobility comments: increased time and effort  Transfers Overall transfer level: Needs assistance Equipment used: None Transfers: Sit to/from Stand;Stand Pivot Transfers Sit to Stand: Supervision Stand pivot transfers: Supervision       General  transfer comment: to BSC, on 10 L HFNC,    Balance Overall balance assessment: Mild deficits observed, not formally tested                                         ADL either performed or assessed with clinical judgement   ADL Overall ADL's : Needs assistance/impaired     Grooming: Set up;Sitting;Wash/dry face;Oral care;Brushing hair Grooming Details (indicate cue type and reason): use of shampoo cap as well              Lower Body Dressing: Maximal assistance;Sit to/from stand Lower Body Dressing Details (indicate cue type and reason): pt unable to reach her feet at this time, requires assist to don/doff socks; minguard-supervision for standing balance Toilet Transfer: Supervision/safety;Stand-pivot;BSC   Toileting- Clothing Manipulation and Hygiene: Supervision/safety;Sit to/from stand Toileting - Clothing Manipulation Details (indicate cue type and reason): in standing with warm wash cloth     Functional mobility during ADLs: Min guard General ADL Comments: pt with good motivation, continues to have limited endurance and requires rest breaks to ensure adequate O2 sats and RR      Vision       Perception     Praxis      Cognition Arousal/Alertness: Awake/alert Behavior During Therapy: WFL for tasks assessed/performed Overall Cognitive Status: Within Functional Limits for tasks assessed  General Comments: less anxious today, very motivated        Exercises     Shoulder Instructions       General Comments      Pertinent Vitals/ Pain       Pain Assessment: No/denies pain  Home Living                                          Prior Functioning/Environment              Frequency  Min 3X/week        Progress Toward Goals  OT Goals(current goals can now be found in the care plan section)  Progress towards OT goals: Progressing toward goals  Acute Rehab OT  Goals Patient Stated Goal: "to get home and be able to take care of myself" OT Goal Formulation: With patient Time For Goal Achievement: 03/01/19 Potential to Achieve Goals: Good ADL Goals Pt Will Perform Grooming: with modified independence;standing Pt Will Perform Upper Body Dressing: with modified independence;sitting Pt Will Perform Lower Body Dressing: with modified independence;sit to/from stand Pt Will Transfer to Toilet: with modified independence;ambulating Pt Will Perform Toileting - Clothing Manipulation and hygiene: with modified independence;sit to/from stand Pt/caregiver will Perform Home Exercise Program: Both right and left upper extremity;With theraband;Independently;With written HEP provided Additional ADL Goal #1: Pt will recall 3 strategies for conserving energy during ADL at independent level  Plan Discharge plan remains appropriate    Co-evaluation                 AM-PAC OT "6 Clicks" Daily Activity     Outcome Measure   Help from another person eating meals?: None Help from another person taking care of personal grooming?: A Little Help from another person toileting, which includes using toliet, bedpan, or urinal?: A Little Help from another person bathing (including washing, rinsing, drying)?: A Lot Help from another person to put on and taking off regular upper body clothing?: A Little Help from another person to put on and taking off regular lower body clothing?: A Lot 6 Click Score: 17    End of Session Equipment Utilized During Treatment: Oxygen  OT Visit Diagnosis: Unsteadiness on feet (R26.81);Other abnormalities of gait and mobility (R26.89);Muscle weakness (generalized) (M62.81)   Activity Tolerance Patient tolerated treatment well   Patient Left in chair;with call bell/phone within reach   Nurse Communication Mobility status        Time: [id number] OT Time Calculation (min): 36 min  Charges: OT General Charges $OT Visit: 1  Visit OT Treatments $Self Care/Home Management : 23-37 mins  Lou Cal, OT [facility] Pager [phone] Office [phone]    Raymondo Band 02/17/2019, 3:20 PM

## 2019-02-17 NOTE — Plan of Care (Signed)
Continue w plan of care

## 2019-02-17 NOTE — Progress Notes (Signed)
Refusing to prone

## 2019-02-18 LAB — GLUCOSE, CAPILLARY
Glucose-Capillary: 181 mg/dL — ABNORMAL HIGH (ref 70–99)
Glucose-Capillary: 184 mg/dL — ABNORMAL HIGH (ref 70–99)
Glucose-Capillary: 136 mg/dL — ABNORMAL HIGH (ref 70–99)
Glucose-Capillary: 127 mg/dL — ABNORMAL HIGH (ref 70–99)

## 2019-02-18 MED ORDER — METHYLPREDNISOLONE SODIUM SUCC 125 MG IJ SOLR: 60.0000 mg | Freq: Every day | INTRAMUSCULAR | Status: DC

## 2019-02-18 MED ORDER — DEXAMETHASONE 6 MG PO TABS
6.0000 mg | ORAL_TABLET | Freq: Two times a day (BID) | ORAL | Status: AC
  Administered 2019-02-19: 6 mg via ORAL
  Filled 2019-02-18: qty 1

## 2019-02-18 MED ORDER — DEXAMETHASONE 4 MG PO TABS
2.0000 mg | ORAL_TABLET | Freq: Two times a day (BID) | ORAL | Status: AC
  Administered 2019-02-18: 2 mg via ORAL
  Filled 2019-02-18: qty 1

## 2019-02-18 NOTE — Progress Notes (Addendum)
Patient resting in bed, 02 removed and saturation was 90% on RA with no movement, pt does not feel comfortable/unable at this time to stand or transfer without supplemental 02 to obtain base reading due to SOB. Supplemental O2 reapplied at HFNC 4L for pt to use BSC. Will continue to monitor

## 2019-02-18 NOTE — Progress Notes (Signed)
PROGRESS NOTE  Paula King  [id number] DOB: [birth year] DOA: 02/11/2019 PCP: Alycia Rossetti, MD   Brief Narrative: Paula King is a 38 y.o. female with a history of Cushing's disease and morbid obesity who presented to the [facility] with complaints of shortness of breath and fever for 3 days.  She tested positive for SARS-CoV-2 2 days prior to her presentation.  Review of systems was also remarkable for loss of taste and some loose stool.  In the emergency room she was noted to have patchy bilateral airspace opacities consistent with COVID pneumonia, and she suffered with hypoxia requiring supplemental oxygen. She was admitted to [facility], started on remdesivir and steroids. With worsening hypoxia, after discussion of its off-label nature, tocilizumab was given on 8/18.   Assessment & Plan: Principal Problem:   COVID-19 virus infection Active Problems:   Acute respiratory failure with hypoxia (HCC)   Hypokalemia   Abnormal liver function  Acute hypoxic respiratory failure due to covid-19 pneumonia:  - Wean oxygen as able, improving today. If improvement continues can arrange home oxygen and DC in next 24 hours. Continue to mobilize as much as possible. Reviewed concept of permissive exertional hypoxia in the absence of respiratory distress.  - Completed remdesivir 8/16 - 8/20.  - Continue steroids, change to decadron with test dose tonight. Pt w/hx intolerance to prednisone. Planning 10 days (8/16 - 8/25) - s/p tocilizumab x1 8/28, monitor LFTs and for secondary infections.  - Follow inflammatory markers. Ferritin normalized, D-dimer wnl, CRP showing sustained improvement, recheck in AM  Morbid obesity:  - Weight loss recommended long term  Facial rash: Erythematous without papules, nonpruritic, not associated with new medication. Has h/o eczema. Distribution not consistent with detergent or other irritant/allergic exposure. No signs or symptoms of systemic allergic reaction. Eo count  is ~0 and no angioedema. Since resolved.  - Can give po antihistamine if recurs. Also asked her to take picture if recurs.   Sinus bradycardia: Without pauses or syncope/presyncope. More persistent when sleeping. Not associated with hypotension. Telemetry, which was personally reviewed today, has consistently demonstrated NSR with occasional bradycardia into 50's and rare PVCs with short run of ventricular bigeminy earlier in admission.  - Continue to monitor.  - Avoid AV nodal agents. Formerly on atenolol but not currently. Telemetry alarms reviewed, showing no pauses. Consider outpatient monitor.  Steroid-induced hyperglycemia, prediabetes: HbA1c is 5.9%.  - Sensitive SSI to facilitate optimal glucose control. D/w patient  Elevated LFTs: Suspect an element of hepatic steatosis, possibly irritated by viral infection.  - Continues improvement.  Cushing's disease: Not on chronic therapy.   Hypokalemia: Resolved.   DVT prophylaxis: Lovenox Code Status: Full Family Communication: Sister by phone daily  Disposition Plan: If hypoxia improves, can DC 8/24.   Consultants:   None  Procedures:   None  Antimicrobials:  Remdesivir 8/16 - 8/20  Subjective: "I want to go home." Feels more strength, breathing got much better over past 12 hours with oxygen requirement coming down to 4L. Still dyspneic with exertion and has not ambulated significantly. Asymptomatic when bradycardic last night, always during sleep.  Objective: Vitals:   02/17/19 2355 02/18/19 0535 02/18/19 0543 02/18/19 0840  BP:  111/70  (!) 131/93  Pulse:  84  81  Resp:  (!) 21  20  Temp:  97.6 F (36.4 C)  98 F (36.7 C)  TempSrc:  Oral  Oral  SpO2: 93% 94% 92% 93%  Weight:      Height:  Intake/Output Summary (Last 24 hours) at 02/18/2019 1028 Last data filed at 02/18/2019 0500 Gross per 24 hour  Intake 980 ml  Output -  Net 980 ml   Filed Weights   02/11/19 0327 02/11/19 1720  Weight: 120.2 kg  120.2 kg   Gen: 38 y.o. female in no distress Pulm: Nonlabored breathing supplemental oxygen, becomes tachypneic just rising for pulmonary exam. Clear with improving aeration. CV: Regular rate and rhythm. No murmur, rub, or gallop. No JVD, no dependent edema. GI: Abdomen soft, non-tender, non-distended, with normoactive bowel sounds.  Ext: Warm, no deformities Skin: No rashes, lesions or ulcers on visualized skin. Neuro: Alert and oriented. No focal neurological deficits. Psych: Judgement and insight appear fair. Mood euthymic & affect congruent. Behavior is appropriate.    Data Reviewed: I have personally reviewed following labs and imaging studies  CBC: Recent Labs  Lab 02/13/19 0400 02/14/19 0545 02/15/19 0240 02/16/19 0100 02/17/19 0036  WBC 6.4 6.7 8.1 10.3 11.9*  NEUTROABS 4.6 4.6 6.4 7.9* 9.3*  HGB 13.0 13.6 13.2 13.6 13.7  HCT 40.9 42.5 40.9 42.0 42.2  MCV 95.8 95.5 95.3 94.8 94.4  PLT 302 356 378 386 [id number]   Basic Metabolic Panel: Recent Labs  Lab 02/13/19 0400 02/14/19 0545 02/15/19 0240 02/16/19 0100 02/17/19 0036  NA 144 143 141 141 139  K 3.6 3.8 3.9 3.8 3.7  CL 104 106 105 105 105  CO2 29 26 27 27 25   GLUCOSE 113* 119* 176* 169* 210*  BUN 18 20 21* 24* 25*  CREATININE 0.83 0.94 0.85 0.85 0.90  CALCIUM 8.6* 8.7* 8.6* 8.5* 8.2*   GFR: Estimated Creatinine Clearance: 108.2 mL/min (by C-G formula based on SCr of 0.9 mg/dL). Liver Function Tests: Recent Labs  Lab 02/13/19 0400 02/14/19 0545 02/15/19 0240 02/16/19 0100 02/17/19 0036  AST 42* 42* 85* 43* 23  ALT 57* 51* 85* 72* 53*  ALKPHOS 62 71 66 67 65  BILITOT 0.6 0.7 0.6 0.4 0.6  PROT 7.6 7.3 7.0 6.5 6.5  ALBUMIN 3.2* 3.1* 3.0* 2.9* 2.9*   No results for input(s): LIPASE, AMYLASE in the last 168 hours. No results for input(s): AMMONIA in the last 168 hours. Coagulation Profile: No results for input(s): INR, PROTIME in the last 168 hours. Cardiac Enzymes: No results for input(s): CKTOTAL,  CKMB, CKMBINDEX, TROPONINI in the last 168 hours. BNP (last 3 results) No results for input(s): PROBNP in the last 8760 hours. HbA1C: Recent Labs    02/16/19 0105  HGBA1C 5.9*   CBG: Recent Labs  Lab 02/17/19 0804 02/17/19 1208 02/17/19 1610 02/17/19 2113 02/18/19 0734  GLUCAP 137* 146* 200* 231* 181*   Lipid Profile: No results for input(s): CHOL, HDL, LDLCALC, TRIG, CHOLHDL, LDLDIRECT in the last 72 hours. Thyroid Function Tests: No results for input(s): TSH, T4TOTAL, FREET4, T3FREE, THYROIDAB in the last 72 hours. Anemia Panel: No results for input(s): VITAMINB12, FOLATE, FERRITIN, TIBC, IRON, RETICCTPCT in the last 72 hours. Urine analysis:    Component Value Date/Time   COLORURINE YELLOW 08/09/2014 1405   APPEARANCEUR CLOUDY (A) 08/09/2014 1405   LABSPEC 1.025 04/18/2017 0939   PHURINE 5.0 04/18/2017 0939   GLUCOSEU NEGATIVE 04/18/2017 0939   HGBUR NEGATIVE 04/18/2017 0939   BILIRUBINUR Negative 08/21/2017 [place] 04/18/2017 0939   PROTEINUR Negative 08/21/2017 1227   PROTEINUR NEGATIVE 04/18/2017 0939   UROBILINOGEN 0.2 08/21/2017 1227   UROBILINOGEN 0.2 04/18/2017 0939   NITRITE Negative 08/21/2017 1227   NITRITE NEGATIVE 04/18/2017  [place] Negative 08/21/2017 1227   Recent Results (from the past 240 hour(s))  SARS Coronavirus 2 [facility] order, Performed in [facility] hospital lab) Nasopharyngeal Nasopharyngeal Swab     Status: Abnormal   Collection Time: 02/11/19  3:47 AM   Specimen: Nasopharyngeal Swab  Result Value Ref Range Status   SARS Coronavirus 2 POSITIVE (A) NEGATIVE Final    Comment: CRITICAL RESULT CALLED TO, READ BACK BY AND VERIFIED WITH: DOSS,M @ 0448 ON 02/11/19 BY JUW Performed at [facility], [address] [place], [place] [address]   Blood Culture (routine x 2)     Status: None   Collection Time: 02/11/19  5:25 AM   Specimen: Left Antecubital; Blood  Result Value Ref Range Status   Specimen  Description   Final    LEFT ANTECUBITAL BOTTLES DRAWN AEROBIC AND ANAEROBIC   Special Requests Blood Culture adequate volume  Final   Culture   Final    NO GROWTH 5 DAYS Performed at [facility], [address], [place], [place] [address]    Report Status 02/16/2019 FINAL  Final  Blood Culture (routine x 2)     Status: None   Collection Time: 02/11/19  5:25 AM   Specimen: BLOOD LEFT HAND  Result Value Ref Range Status   Specimen Description BLOOD LEFT HAND BOTTLES DRAWN AEROBIC ONLY  Final   Special Requests Blood Culture adequate volume  Final   Culture   Final    NO GROWTH 5 DAYS Performed at [facility], [address], [place], [place] [address]    Report Status 02/16/2019 FINAL  Final      Radiology Studies: No results found.  Scheduled Meds: . cholecalciferol  1,000 Units Oral Daily  . dexamethasone  2 mg Oral Q12H   Followed by  . [START ON 02/19/2019] dexamethasone  6 mg Oral Q12H  . enoxaparin (LOVENOX) injection  60 mg Subcutaneous Q24H  . insulin aspart  0-5 Units Subcutaneous QHS  . insulin aspart  0-9 Units Subcutaneous TID WC  . loratadine  10 mg Oral Daily  . sodium chloride flush  3 mL Intravenous Q12H  . vitamin B-12  1,000 mcg Oral Daily   Continuous Infusions: . sodium chloride       LOS: 7 days   Time spent: 35 minutes.  Patrecia Pour, MD [facility] [email or web address] Password [facility] 02/18/2019, 10:28 AM

## 2019-02-18 NOTE — Progress Notes (Signed)
Patient deferred family update phone call. 

## 2019-02-18 NOTE — Plan of Care (Addendum)
Continue with POC, monitor supplemental oxygen requirements for home 02 for discharge needs. This Probation officer spoke w pt's sister via telephone in regards to updates/questions, no further concerns at this time.

## 2019-02-18 NOTE — Progress Notes (Addendum)
Able to wean patient to 4L HFNC, able to prone for 3.5 hours overnight.

## 2019-02-19 LAB — CBC WITH DIFFERENTIAL/PLATELET
Abs Immature Granulocytes: 0.55 10*3/uL — ABNORMAL HIGH (ref 0.00–0.07)
Basophils Absolute: 0.1 10*3/uL (ref 0.0–0.1)
Basophils Relative: 1 %
Eosinophils Absolute: 0.2 10*3/uL (ref 0.0–0.5)
Eosinophils Relative: 2 %
HCT: 43.7 % (ref 36.0–46.0)
Hemoglobin: 14.2 g/dL (ref 12.0–15.0)
Immature Granulocytes: 5 %
Lymphocytes Relative: 16 %
Lymphs Abs: 1.8 10*3/uL (ref 0.7–4.0)
MCH: 30.8 pg (ref 26.0–34.0)
MCHC: 32.5 g/dL (ref 30.0–36.0)
MCV: 94.8 fL (ref 80.0–100.0)
Monocytes Absolute: 1 10*3/uL (ref 0.1–1.0)
Monocytes Relative: 9 %
Neutro Abs: 7.7 10*3/uL (ref 1.7–7.7)
Neutrophils Relative %: 67 %
Platelets: 418 10*3/uL — ABNORMAL HIGH (ref 150–400)
RBC: 4.61 MIL/uL (ref 3.87–5.11)
RDW: 13.4 % (ref 11.5–15.5)
WBC: 11.3 10*3/uL — ABNORMAL HIGH (ref 4.0–10.5)
nRBC: 0 % (ref 0.0–0.2)

## 2019-02-19 LAB — COMPREHENSIVE METABOLIC PANEL
ALT: 31 U/L (ref 0–44)
AST: 16 U/L (ref 15–41)
Albumin: 2.8 g/dL — ABNORMAL LOW (ref 3.5–5.0)
Alkaline Phosphatase: 52 U/L (ref 38–126)
Anion gap: 14 (ref 5–15)
BUN: 21 mg/dL — ABNORMAL HIGH (ref 6–20)
CO2: 22 mmol/L (ref 22–32)
Calcium: 8.3 mg/dL — ABNORMAL LOW (ref 8.9–10.3)
Chloride: 104 mmol/L (ref 98–111)
Creatinine, Ser: 0.92 mg/dL (ref 0.44–1.00)
GFR calc Af Amer: >60 mL/min (ref >60)
GFR calc non Af Amer: >60 mL/min (ref >60)
Glucose, Bld: 186 mg/dL — ABNORMAL HIGH (ref 70–99)
Potassium: 3.4 mmol/L — ABNORMAL LOW (ref 3.5–5.1)
Sodium: 140 mmol/L (ref 135–145)
Total Bilirubin: 0.5 mg/dL (ref 0.3–1.2)
Total Protein: 5.9 g/dL — ABNORMAL LOW (ref 6.5–8.1)

## 2019-02-19 LAB — GLUCOSE, CAPILLARY
Glucose-Capillary: 113 mg/dL — ABNORMAL HIGH (ref 70–99)
Glucose-Capillary: 104 mg/dL — ABNORMAL HIGH (ref 70–99)

## 2019-02-19 LAB — C-REACTIVE PROTEIN: CRP: <0.8 mg/dL (ref <1.0)

## 2019-02-19 MED ORDER — POTASSIUM CHLORIDE CRYS ER 20 MEQ PO TBCR
20.0000 meq | EXTENDED_RELEASE_TABLET | Freq: Once | ORAL | Status: AC
  Administered 2019-02-19: 20 meq via ORAL
  Filled 2019-02-19: qty 1

## 2019-02-19 MED ORDER — DEXAMETHASONE 6 MG PO TABS: 6.0000 mg | ORAL_TABLET | Freq: Every day | ORAL | 0 refills | Status: DC

## 2019-02-19 MED FILL — DEXAMETHASONE 2 MG TABLET: 2 | 1 days supply | Qty: 3 | Fill #0

## 2019-02-19 NOTE — Discharge Summary (Addendum)
Physician Discharge Summary  CACIA COVALT [id number] DOB: 1980/07/30 DOA: 02/11/2019  PCP: Alycia Rossetti, MD  Admit date: 02/11/2019 Discharge date: 02/19/2019  Admitted From: Home Disposition: Home   Recommendations for Outpatient Follow-up:  1. Follow up with PCP in 1-2 weeks 2. Please obtain CMP/CBC at follow up  Home Health: None Equipment/Devices: 2L O2* was ordered but care management is unable to arrange oxygen at home due to patient living in [place]. Patient re-walked this afternoon and shows continued significant improvement. Talking full sentences while ambulating remaining above 90% SpO2 around the unit until the end where she was reentering her room, became more dyspneic, hypoxic to mid-80%'s. She requests discharge without oxygen. Discharge Condition: Stable, improved CODE STATUS: Full Diet recommendation: Regular  Brief/Interim Summary: AARALYNN BULLER is a 38 y.o. female with a history of Cushing's disease and morbid obesity who presented to [facility] with complaints of shortness of breath and fever for 3 days. She tested positive for SARS-CoV-2 2 days prior to her presentation. Review of systems was also remarkable for loss of taste and some loose stool. In the emergency room she was noted to have patchy bilateral airspace opacities consistent with COVID pneumonia, and she suffered with hypoxia requiring supplemental oxygen. She was admitted to [facility], started on remdesivir and steroids. With worsening hypoxia, after discussion of its off-label nature, tocilizumab was given on 8/18. Over the next 5 days the patient's hypoxia has improved.   Discharge Diagnoses:  Principal Problem:   COVID-19 virus infection Active Problems:   Acute respiratory failure with hypoxia (HCC)   Hypokalemia   Abnormal liver function  Acute hypoxic respiratory failure due to covid-19 pneumonia:  - Continue to mobilize as much as possible, will DC with supplemental oxygen.   -  Completed remdesivir 8/16 - 8/20.  - Continue steroids. Planning 10 days (8/16 - 8/25) - s/p tocilizumab x1 8/28, monitor LFTs and for secondary infections.  - Inflammatory markers have normalized.   Morbid obesity:  - Weight loss recommended long term  Facial rash: Erythematous without papules, nonpruritic, not associated with new medication. Has h/o eczema. Distribution not consistent with detergent or other irritant/allergic exposure. No signs or symptoms of systemic allergic reaction. Eo count is ~0 and no angioedema. Since resolved.   Sinus bradycardia: Without pauses or syncope/presyncope. More persistent when sleeping. Not associated with hypotension. Telemetry, which was personally reviewed today, has consistently demonstrated NSR with occasional bradycardia into 50's and rare PVCs with short run of ventricular bigeminy earlier in admission.  - Avoid AV nodal agents. Formerly on atenolol but not currently. Telemetry alarms reviewed, showing no pauses. Consider outpatient monitor.  Steroid-induced hyperglycemia, prediabetes: HbA1c is 5.9%.  - Sensitive SSI to facilitate optimal glucose control. D/w patient  Elevated LFTs: Suspect an element of hepatic steatosis, possibly irritated by viral infection.  - Resolved  Cushing's disease: Not on chronic therapy.   Hypokalemia: Resolved.   Discharge Instructions Discharge Instructions    Discharge instructions   Complete by: As directed    You are being discharged from the hospital after treatment for covid-19 infection. You are felt to be stable enough to no longer require inpatient monitoring, testing, and treatment, though you will need to follow the recommendations below: - Complete the course of steroids with just one more dose tomorrow morning. You have completed all other treatments.  - You will be discharged with supplemental oxygen. - It is recommended that you remain in self-isolation for 2 weeks from the  date of  discharge to reduce risk of transmission.  - Do not take NSAID medications (including, but not limited to, ibuprofen, advil, motrin, naproxen, aleve, goody's powder, etc.) - Follow up with your doctor in the next week via telehealth or seek medical attention right away if your symptoms get WORSE.  - Consider donating plasma after you have recovered (either 14 days after a negative test or 28 days after symptoms have completely resolved) because your antibodies to this virus may be helpful to give to others with life-threatening infections. Please go to the website www.oneblood.org if you would like to consider volunteering for plasma donation.    Directions for you at home:  Wear a facemask You should wear a facemask that covers your nose and mouth when you are in the same room with other people and when you visit a healthcare provider. People who live with or visit you should also wear a facemask while they are in the same room with you.  Separate yourself from other people in your home As much as possible, you should stay in a different room from other people in your home. Also, you should use a separate bathroom, if available.  Avoid sharing household items You should not share dishes, drinking glasses, cups, eating utensils, towels, bedding, or other items with other people in your home. After using these items, you should wash them thoroughly with soap and water.  Cover your coughs and sneezes Cover your mouth and nose with a tissue when you cough or sneeze, or you can cough or sneeze into your sleeve. Throw used tissues in a lined trash can, and immediately wash your hands with soap and water for at least 20 seconds or use an alcohol-based hand rub.  Wash your [facility] your hands often and thoroughly with soap and water for at least 20 seconds. You can use an alcohol-based hand sanitizer if soap and water are not available and if your hands are not visibly dirty. Avoid touching  your eyes, nose, and mouth with unwashed hands.  Directions for those who live with, or provide care at home for you:  Limit the number of people who have contact with the patient If possible, have only one caregiver for the patient. Other household members should stay in another home or place of residence. If this is not possible, they should stay in another room, or be separated from the patient as much as possible. Use a separate bathroom, if available. Restrict visitors who do not have an essential need to be in the home.  Ensure good ventilation Make sure that shared spaces in the home have good air flow, such as from an air conditioner or an opened window, weather permitting.  Wash your hands often Wash your hands often and thoroughly with soap and water for at least 20 seconds. You can use an alcohol based hand sanitizer if soap and water are not available and if your hands are not visibly dirty. Avoid touching your eyes, nose, and mouth with unwashed hands. Use disposable paper towels to dry your hands. If not available, use dedicated cloth towels and replace them when they become wet.  Wear a facemask and gloves Wear a disposable facemask at all times in the room and gloves when you touch or have contact with the patient's blood, body fluids, and/or secretions or excretions, such as sweat, saliva, sputum, nasal mucus, vomit, urine, or feces.  Ensure the mask fits over your nose and mouth tightly, and do  not touch it during use. Throw out disposable facemasks and gloves after using them. Do not reuse. Wash your hands immediately after removing your facemask and gloves. If your personal clothing becomes contaminated, carefully remove clothing and launder. Wash your hands after handling contaminated clothing. Place all used disposable facemasks, gloves, and other waste in a lined container before disposing them with other household waste. Remove gloves and wash your hands immediately  after handling these items.  Do not share dishes, glasses, or other household items with the patient Avoid sharing household items. You should not share dishes, drinking glasses, cups, eating utensils, towels, bedding, or other items with a patient who is confirmed to have, or being evaluated for, COVID-19 infection. After the person uses these items, you should wash them thoroughly with soap and water.  Wash laundry thoroughly Immediately remove and wash clothes or bedding that have blood, body fluids, and/or secretions or excretions, such as sweat, saliva, sputum, nasal mucus, vomit, urine, or feces, on them. Wear gloves when handling laundry from the patient. Read and follow directions on labels of laundry or clothing items and detergent. In general, wash and dry with the warmest temperatures recommended on the label.  Clean all areas the individual has used often Clean all touchable surfaces, such as counters, tabletops, doorknobs, bathroom fixtures, toilets, phones, keyboards, tablets, and bedside tables, every day. Also, clean any surfaces that may have blood, body fluids, and/or secretions or excretions on them. Wear gloves when cleaning surfaces the patient has come in contact with. Use a diluted bleach solution (e.g., dilute bleach with 1 part bleach and 10 parts water) or a household disinfectant with a label that says EPA-registered for coronaviruses. To make a bleach solution at home, add 1 tablespoon of bleach to 1 quart (4 cups) of water. For a larger supply, add  cup of bleach to 1 gallon (16 cups) of water. Read labels of cleaning products and follow recommendations provided on product labels. Labels contain instructions for safe and effective use of the cleaning product including precautions you should take when applying the product, such as wearing gloves or eye protection and making sure you have good ventilation during use of the product. Remove gloves and wash hands immediately  after cleaning.  Monitor yourself for signs and symptoms of illness Caregivers and household members are considered close contacts, should monitor their health, and will be asked to limit movement outside of the home to the extent possible. Follow the monitoring steps for close contacts listed on the symptom monitoring form.   If you have additional questions, contact your local health department or call the epidemiologist on call at [phone] (available 24/7). This guidance is subject to change. For the most up-to-date guidance from [facility], please refer to their website: YouBlogs.pl   Increase activity slowly   Complete by: As directed    MyChart COVID-19 home monitoring program   Complete by: Feb 19, 2019    Is the patient willing to use the [place] for home monitoring?: Yes     Allergies as of 02/19/2019      Reactions   Ciprofloxacin    IV Only-Patient started sweating and becoming clammy when was infused with IV cipro on 08/09/14. Can tolerate PO Ciprofloxacin       Medication List    TAKE these medications   B-12 1000 MCG Tabs Take 1 tablet by mouth daily.   cetirizine 10 MG tablet Commonly known as: ZYRTEC Take 10 mg by mouth daily.  cholecalciferol 1000 units tablet Commonly known as: VITAMIN D Take 1,000 Units by mouth daily.   dexamethasone 6 MG tablet Commonly known as: Decadron Take 1 tablet (6 mg total) by mouth daily for 1 day. Start taking on: February 20, 2019   ondansetron 4 MG tablet Commonly known as: Zofran Take 1 tablet (4 mg total) by mouth every 8 (eight) hours as needed for nausea or vomiting.            Durable Medical Equipment  (From admission, onward)         Start     Ordered   02/19/19 0934  For home use only DME oxygen  Once    Question Answer Comment  Length of Need 6 Months   Mode or (Route) Nasal cannula   Liters per Minute 2   Frequency Continuous  (stationary and portable oxygen unit needed)   Oxygen delivery system Gas      02/19/19 0934         Follow-up Information    Alycia Rossetti, MD. Schedule an appointment as soon as possible for a visit in 1 week(s).   Specialty: Family Medicine Contact information: [address] [place]  [address] [phone]        Herminio Commons, MD .   Specialty: Cardiology Contact information: [place] [address] [phone]          Allergies  Allergen Reactions  . Ciprofloxacin     IV Only-Patient started sweating and becoming clammy when was infused with IV cipro on 08/09/14. Can tolerate PO Ciprofloxacin     Consultations:  None  Procedures/Studies: Dg Chest Port 1 View  Result Date: 02/13/2019 CLINICAL DATA:  Pneumonia due to COVID-19 EXAM: PORTABLE CHEST 1 VIEW COMPARISON:  02/12/2019 FINDINGS: Very low lung volumes. Patchy bilateral airspace disease is similar to prior study. Heart is normal size. No effusions or acute bony abnormality. IMPRESSION: Very low lung volumes. Continued patchy bilateral airspace disease, unchanged. Electronically Signed   By: Rolm Baptise M.D.   On: 02/13/2019 07:58   Dg Chest Port 1 View  Result Date: 02/12/2019 CLINICAL DATA:  COVID-19 pneumonia. EXAM: PORTABLE CHEST 1 VIEW COMPARISON:  02/11/2019 FINDINGS: The heart size and mediastinal contours are within normal limits. Similar appearance of bilateral pulmonary infiltrates in a peripheral and lower zone predominance with potentially slight worsening at the left lung base. No visualized pleural effusions or pneumothorax. The visualized skeletal structures are unremarkable. IMPRESSION: Similar bilateral pulmonary infiltrates with potential slight worsening at the left lung base. Electronically Signed   By: Aletta Edouard M.D.   On: 02/12/2019 07:51   Dg Chest Port 1 View  Result Date: 02/11/2019 CLINICAL DATA:  Shortness of breath.  COVID-19 positive. EXAM:  PORTABLE CHEST 1 VIEW COMPARISON:  Radiograph 02/03/2015 FINDINGS: Low volumes assessment. Patchy bilateral peripheral predominant lung opacities. Grossly normal heart size for technique. No large pleural effusion or pneumothorax. IMPRESSION: Very low lung volumes with patchy bilateral airspace opacities consistent with COVID-19 pneumonia. Electronically Signed   By: Keith Rake M.D.   On: 02/11/2019 03:51     Subjective: Feels less dyspnea than previously, though still short of breath with exertion, no leg swelling or chest pain. Weakness also improving. Wants to go home today with oxygen.  Discharge Exam: Vitals:   02/19/19 0515 02/19/19 0826  BP: 95/67 (!) 118/94  Pulse: 75 93  Resp: (!) 22 (!) 22  Temp: 97.9 F (36.6 C)  81 F (36.7 C)  SpO2: 91% (!) 89%   General: Pt is alert, awake, not in acute distress Cardiovascular: RRR, S1/S2 +, no rubs, no gallops Respiratory: CTA bilaterally, no wheezing, no rhonchi Abdominal: Soft, NT, ND, bowel sounds + Extremities: No edema, no cyanosis  Labs: BNP (last 3 results) No results for input(s): BNP in the last 8760 hours. Basic Metabolic Panel: Recent Labs  Lab 02/14/19 0545 02/15/19 0240 02/16/19 0100 02/17/19 0036 02/19/19 0300  NA 143 141 141 139 140  K 3.8 3.9 3.8 3.7 3.4*  CL 106 105 105 105 104  CO2 26 27 27 25 22   GLUCOSE 119* 176* 169* 210* 186*  BUN 20 21* 24* 25* 21*  CREATININE 0.94 0.85 0.85 0.90 0.92  CALCIUM 8.7* 8.6* 8.5* 8.2* 8.3*   Liver Function Tests: Recent Labs  Lab 02/14/19 0545 02/15/19 0240 02/16/19 0100 02/17/19 0036 02/19/19 0300  AST 42* 85* 43* 23 16  ALT 51* 85* 72* 53* 31  ALKPHOS 71 66 67 65 52  BILITOT 0.7 0.6 0.4 0.6 0.5  PROT 7.3 7.0 6.5 6.5 5.9*  ALBUMIN 3.1* 3.0* 2.9* 2.9* 2.8*   No results for input(s): LIPASE, AMYLASE in the last 168 hours. No results for input(s): AMMONIA in the last 168 hours. CBC: Recent Labs  Lab 02/14/19 0545 02/15/19 0240 02/16/19 0100  02/17/19 0036 02/19/19 0300  WBC 6.7 8.1 10.3 11.9* 11.3*  NEUTROABS 4.6 6.4 7.9* 9.3* 7.7  HGB 13.6 13.2 13.6 13.7 14.2  HCT 42.5 40.9 42.0 42.2 43.7  MCV 95.5 95.3 94.8 94.4 94.8  PLT 356 378 386 407* 418*   Cardiac Enzymes: No results for input(s): CKTOTAL, CKMB, CKMBINDEX, TROPONINI in the last 168 hours. BNP: Invalid input(s): POCBNP CBG: Recent Labs  Lab 02/18/19 0734 02/18/19 1153 02/18/19 1619 02/18/19 2137 02/19/19 0743  GLUCAP 181* 184* 136* 127* 113*   D-Dimer No results for input(s): DDIMER in the last 72 hours. Hgb A1c No results for input(s): HGBA1C in the last 72 hours. Lipid Profile No results for input(s): CHOL, HDL, LDLCALC, TRIG, CHOLHDL, LDLDIRECT in the last 72 hours. Thyroid function studies No results for input(s): TSH, T4TOTAL, T3FREE, THYROIDAB in the last 72 hours.  Invalid input(s): FREET3 Anemia work up No results for input(s): VITAMINB12, FOLATE, FERRITIN, TIBC, IRON, RETICCTPCT in the last 72 hours. Urinalysis    Component Value Date/Time   COLORURINE YELLOW 08/09/2014 1405   APPEARANCEUR CLOUDY (A) 08/09/2014 1405   LABSPEC 1.025 04/18/2017 0939   PHURINE 5.0 04/18/2017 0939   GLUCOSEU NEGATIVE 04/18/2017 0939   HGBUR NEGATIVE 04/18/2017 0939   BILIRUBINUR Negative 08/21/2017 1227   KETONESUR NEGATIVE 04/18/2017 0939   PROTEINUR Negative 08/21/2017 1227   PROTEINUR NEGATIVE 04/18/2017 0939   UROBILINOGEN 0.2 08/21/2017 1227   UROBILINOGEN 0.2 04/18/2017 0939   NITRITE Negative 08/21/2017 1227   NITRITE NEGATIVE 04/18/2017 0939   LEUKOCYTESUR Negative 08/21/2017 1227    Microbiology Recent Results (from the past 240 hour(s))  SARS Coronavirus 2 [facility] order, Performed in [facility] hospital lab) Nasopharyngeal Nasopharyngeal Swab     Status: Abnormal   Collection Time: 02/11/19  3:47 AM   Specimen: Nasopharyngeal Swab  Result Value Ref Range Status   SARS Coronavirus 2 POSITIVE (A) NEGATIVE Final    Comment: CRITICAL  RESULT CALLED TO, READ BACK BY AND VERIFIED WITH: DOSS,M @ 0448 ON 02/11/19 BY JUW Performed at [facility], [address] [place], [place] [address]   Blood Culture (routine x 2)  Status: None   Collection Time: 02/11/19  5:25 AM   Specimen: Left Antecubital; Blood  Result Value Ref Range Status   Specimen Description   Final    LEFT ANTECUBITAL BOTTLES DRAWN AEROBIC AND ANAEROBIC   Special Requests Blood Culture adequate volume  Final   Culture   Final    NO GROWTH 5 DAYS Performed at [facility], [address], [place], [place] [address]    Report Status 02/16/2019 FINAL  Final  Blood Culture (routine x 2)     Status: None   Collection Time: 02/11/19  5:25 AM   Specimen: BLOOD LEFT HAND  Result Value Ref Range Status   Specimen Description BLOOD LEFT HAND BOTTLES DRAWN AEROBIC ONLY  Final   Special Requests Blood Culture adequate volume  Final   Culture   Final    NO GROWTH 5 DAYS Performed at [facility], [address] [place], [place] [address]    Report Status 02/16/2019 FINAL  Final    Time coordinating discharge: Approximately 40 minutes  Patrecia Pour, MD  [facility] 02/19/2019, 10:43 AM

## 2019-02-19 NOTE — Progress Notes (Signed)
SATURATION QUALIFICATIONS: (This note is used to comply with regulatory documentation for home oxygen)  Patient Saturations on Room Air at Rest = 90%  Patient Saturations on Room Air while Ambulating = 86%  Patient Saturations on 2 Liters of oxygen while Ambulating = 93%  Please briefly explain why patient needs home oxygen: Patient will need 2L of oxygen while ambulating at home. She lives in [place], [place].

## 2019-02-19 NOTE — TOC Progression Note (Signed)
Transition of Care [facility]) - Progression Note    Patient Details  Name: Paula King MRN: [id number] Date of Birth: [birth year]  Transition of Care [facility]) CM/SW Contact  Joaquin Courts, RN Phone Number: 02/19/2019, 3:24 PM  Clinical Narrative:  CM spoke with patient over the telephone. Patient's insurance with a very narrow network of dme providers. IN fat, CM confirmed with insurance that only one dme agency is in network and no out of network benefits are available. In network agency does not provide services in patient's area. No options for oxygen delivery are available, patient was made aware of this. Patient maintains that she wants to dc home today and will not stay any longer in the hospital. Patient states she is fine without oxygen and has not been using it regularly for several days now. CM spoke with bedside RN who performed an additional desaturation screen. Per RB MD was made aware of the results and is in agreement for patient to dc today with no Oxygen.       Expected Discharge Plan: Home/Self Care Barriers to Discharge: No Barriers Identified  Expected Discharge Plan and Services Expected Discharge Plan: Home/Self Care   Discharge Planning Services: CM Consult   Living arrangements for the past 2 months: Single Family Home Expected Discharge Date: 02/19/19               DME Arranged: N/A DME Agency: NA       HH Arranged: NA HH Agency: NA         Social Determinants of Health (SDOH) Interventions    Readmission Risk Interventions No flowsheet data found.

## 2019-02-19 NOTE — Progress Notes (Signed)
SATURATION QUALIFICATIONS: (This note is used to comply with regulatory documentation for home oxygen)  Patient Saturations on Room Air at Rest = 93%  Patient Saturations on Room Air while Ambulating = 84% but after sitting on the edge of the bed for 30 seconds, she recovered to 90% on room air.    Patient will be discharging home today without oxygen. Dr. Bonner Puna is aware that patient will drop her oxygen saturations to 84% on room air with prolonged ambulation, but she recovers to 90% after 30 seconds of sitting. Informed and educated patient on ways to conserve energy and how she needs to take frequent breaks when ambulating. She is in agreement and understands the risk of going home. Case manager made aware.

## 2019-02-20 ENCOUNTER — Other Ambulatory Visit: Payer: Self-pay | Admitting: *Deleted

## 2019-02-20 NOTE — Patient Outreach (Signed)
[facility]) Care Management  02/20/2019  BREINDY TRIMARCO 1980-08-14 [id number]  Transition of Care/Case Closure  In response to voice mail left by this [facility], patient called main [facility] office number and spoke with care management assistant [facility]. Ms. Dunker requested that no further transition of care calls be made to her. She stated to Mickel Baas, "I will call you if I need you."  Plan:  Will close case to [facility] transition of care services.  Barrington Ellison RN,CCM,CDE [place] Management Coordinator Office Phone [phone] Office Fax [phone]

## 2019-02-20 NOTE — Patient Outreach (Signed)
[facility]) Care Management  02/20/2019  Paula King 03-29-1981 [id number]   Transition of care telephone call  Referral received:02/12/19 Initial outreach: 02/20/19 Insurance: [place]  Initial unsuccessful telephone call to patient's home/mobile number in order to complete transition of care assessment; no answer, left HIPAA compliant voicemail message requesting return call.   Objective: Per the electronic medical record, Paula King was hospitalized at [facility] for Covid 19 infection from 8/16-8/24.  Comorbidities include: HTN, prediabetes, diverticulitis, adrenal adenoma, Cushing Syndrome, goiter, attention deficit disorder, morbid obesity, and abnormal liver function She was discharged to home on 02/19/19 without the need for home health services. She had oxygen ordered for home use but a DME provider could not be located for her service area so she requested to go home without home oxygen.  She was enrolled in the Covid 19 home monitoring via MyChart mobile app.  Plan: This RNCM will route unsuccessful outreach letter with [facility] Management pamphlet and 24 hour Nurse Advice Line Magnet to [facility] Management clinical pool to be mailed to patient's home address. This RNCM will attempt another outreach within 4 business days.  Barrington Ellison RN,CCM,CDE [place] Management Coordinator Office Phone [phone] Office Fax [phone]

## 2019-02-21 ENCOUNTER — Other Ambulatory Visit: Payer: Self-pay

## 2019-02-21 ENCOUNTER — Ambulatory Visit (INDEPENDENT_AMBULATORY_CARE_PROVIDER_SITE_OTHER): Payer: No Typology Code available for payment source | Admitting: Family Medicine

## 2019-02-21 DIAGNOSIS — R0902 Hypoxemia: Secondary | ICD-10-CM | POA: Diagnosis not present

## 2019-02-21 DIAGNOSIS — J1289 Other viral pneumonia: Secondary | ICD-10-CM | POA: Diagnosis not present

## 2019-02-21 DIAGNOSIS — U071 COVID-19: Secondary | ICD-10-CM

## 2019-02-21 MED ORDER — ALBUTEROL SULFATE HFA 108 (90 BASE) MCG/ACT IN AERS: 2.0000 | INHALATION_SPRAY | RESPIRATORY_TRACT | 1 refills | Status: DC | PRN

## 2019-02-21 MED ORDER — DEXAMETHASONE 2 MG PO TABS: ORAL_TABLET | ORAL | 0 refills | Status: DC

## 2019-02-21 NOTE — Progress Notes (Signed)
Virtual Visit via Telephone Note  I connected with Paula King on 02/21/19 at 3:40pm by telephone and verified that I am speaking with the correct person using two identifiers.      Pt location: at home   Physician location:  In office, [facility], Vic Blackbird MD     On call: patient and physician   I discussed the limitations, risks, security and privacy concerns of performing an evaluation and management service by telephone and the availability of in person appointments. I also discussed with the patient that there may be a patient responsible charge related to this service. The patient expressed understanding and agreed to proceed.   History of Present Illness:  Pt admitted to [facility] with covid -19 pneumonitis and hypoxia on 8/16, she was treated with anti-viral, standard of care along with dexamethasone and oxygen therapy. Unfortunately upon discharge on 8/24 they could not find an oxygen supplier due to the type of insurance she has and the fact she lives in [place] therefore she has been watching sats at home. When she exerts herself minimally oxygen drops to mid 80's at rest, low 90's. 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 No fever, eating small amounts and drinking liquids. She was also told she could go back to work on Friday??   Observations/Objective:  Speaking in short sentnces over the phone, some SOB noted, mentation appropriate over the phone.  Assessment and Plan:  [id number] infection complicated by pneumonia and hypoxia-  after discussion she does not want to return to ER at this time unfortunately we can not get her oxygen to her home.   Will try dexamethasone taper6mg  x 2 days, 4mg  x 2 days, then 2 mg x 2 days, F/U on Friday.  I advised her ultimately if she cant breath even with steroids, inhaler she has to go back to  hospital and she voiced understanding, but wanted to try at home first.  She obviously can not return to work at this time. I would recommend also waiting at least another week before retesting her for clearance since she still has symptoms.   Follow Up Instructions:    I discussed the assessment and treatment plan with the patient. The patient was provided an opportunity to ask questions and all were answered. The patient agreed with the plan and demonstrated an understanding of the instructions.   The patient was advised to call back or seek an in-person evaluation if the symptoms worsen or if the condition fails to improve as anticipated.  I provided 18 minutes of non-face-to-face time during this encounter. End time 3:58pm   Vic Blackbird, MD

## 2019-02-22 ENCOUNTER — Ambulatory Visit: Payer: No Typology Code available for payment source | Admitting: *Deleted

## 2019-02-22 ENCOUNTER — Encounter: Payer: Self-pay | Admitting: Family Medicine

## 2019-02-23 ENCOUNTER — Encounter: Payer: Self-pay | Admitting: Family Medicine

## 2019-02-23 ENCOUNTER — Telehealth: Payer: Self-pay | Admitting: *Deleted

## 2019-02-23 NOTE — Telephone Encounter (Signed)
Received fax from [facility] for FMLA forms.   Reason FMLA requested: Hypoxia D/T COVID  Forms routed to provider.

## 2019-03-02 ENCOUNTER — Other Ambulatory Visit: Payer: Self-pay

## 2019-03-02 ENCOUNTER — Ambulatory Visit: Payer: Self-pay | Admitting: Family Medicine

## 2019-03-02 ENCOUNTER — Ambulatory Visit: Payer: No Typology Code available for payment source | Admitting: Family Medicine

## 2019-03-02 DIAGNOSIS — U071 COVID-19: Secondary | ICD-10-CM

## 2019-03-02 NOTE — Progress Notes (Signed)
Virtual Visit via Telephone Note  I connected with Paula King on 03/02/19 at  3:45 PM EDT by telephone and verified that I am speaking with the correct person using two identifiers.   I discussed the limitations, risks, security and privacy concerns of performing an evaluation and management service by telephone and the availability of in person appointments. I also discussed with the patient that there may be a patient responsible charge related to this service. The patient expressed understanding and agreed to proceed.   History of Present Illness:  Note pt did not come in for visit,she had cancelled all of the numbers listed inchart were busy. I found a number in one of her mychart messages and was able to speak to her.   She continues to have severe fatigue, though SOB had improved, she was exhausted and knee she would not make appt so she wanted to rescheduled  breathing is much better 94-95% oxygen sat agt home , no fever, minimal cough  does not feel like she can return to work at this time  will reschedule her for next Wed, she has FMLA on file, we can extend next week.    No charge for telephone f/u- COVID -19 Vic Blackbird, MD

## 2019-03-06 ENCOUNTER — Other Ambulatory Visit: Payer: Self-pay

## 2019-03-06 ENCOUNTER — Emergency Department (HOSPITAL_COMMUNITY): Payer: No Typology Code available for payment source

## 2019-03-06 ENCOUNTER — Encounter (HOSPITAL_COMMUNITY): Payer: Self-pay | Admitting: Emergency Medicine

## 2019-03-06 ENCOUNTER — Telehealth: Payer: No Typology Code available for payment source | Admitting: Nurse Practitioner

## 2019-03-06 ENCOUNTER — Inpatient Hospital Stay (HOSPITAL_COMMUNITY)
Admission: EM | Admit: 2019-03-06 | Discharge: 2019-03-09 | DRG: 177 | Disposition: A | Discharge status: Home / Self Care | Payer: No Typology Code available for payment source | Attending: Family Medicine | Admitting: Family Medicine

## 2019-03-06 DIAGNOSIS — E669 Obesity, unspecified: Secondary | ICD-10-CM | POA: Diagnosis present

## 2019-03-06 DIAGNOSIS — I2693 Single subsegmental pulmonary embolism without acute cor pulmonale: Secondary | ICD-10-CM | POA: Diagnosis present

## 2019-03-06 DIAGNOSIS — R000 Tachycardia, unspecified: Secondary | ICD-10-CM | POA: Diagnosis present

## 2019-03-06 DIAGNOSIS — R059 Cough, unspecified: Secondary | ICD-10-CM

## 2019-03-06 DIAGNOSIS — J9621 Acute and chronic respiratory failure with hypoxia: Secondary | ICD-10-CM | POA: Diagnosis present

## 2019-03-06 DIAGNOSIS — J9601 Acute respiratory failure with hypoxia: Secondary | ICD-10-CM

## 2019-03-06 DIAGNOSIS — Z0184 Encounter for antibody response examination: Secondary | ICD-10-CM

## 2019-03-06 DIAGNOSIS — E249 Cushing's syndrome, unspecified: Secondary | ICD-10-CM | POA: Diagnosis present

## 2019-03-06 DIAGNOSIS — U071 COVID-19: Secondary | ICD-10-CM | POA: Diagnosis not present

## 2019-03-06 DIAGNOSIS — J1289 Other viral pneumonia: Secondary | ICD-10-CM | POA: Diagnosis present

## 2019-03-06 DIAGNOSIS — E039 Hypothyroidism, unspecified: Secondary | ICD-10-CM | POA: Diagnosis present

## 2019-03-06 DIAGNOSIS — N179 Acute kidney failure, unspecified: Secondary | ICD-10-CM | POA: Diagnosis present

## 2019-03-06 DIAGNOSIS — Z79899 Other long term (current) drug therapy: Secondary | ICD-10-CM

## 2019-03-06 DIAGNOSIS — I10 Essential (primary) hypertension: Secondary | ICD-10-CM | POA: Diagnosis present

## 2019-03-06 DIAGNOSIS — J1282 Pneumonia due to coronavirus disease 2019: Secondary | ICD-10-CM

## 2019-03-06 DIAGNOSIS — Z8616 Personal history of COVID-19: Secondary | ICD-10-CM | POA: Diagnosis present

## 2019-03-06 DIAGNOSIS — K5792 Diverticulitis of intestine, part unspecified, without perforation or abscess without bleeding: Secondary | ICD-10-CM | POA: Diagnosis present

## 2019-03-06 DIAGNOSIS — Z8619 Personal history of other infectious and parasitic diseases: Secondary | ICD-10-CM

## 2019-03-06 DIAGNOSIS — R05 Cough: Secondary | ICD-10-CM

## 2019-03-06 DIAGNOSIS — F909 Attention-deficit hyperactivity disorder, unspecified type: Secondary | ICD-10-CM | POA: Diagnosis present

## 2019-03-06 DIAGNOSIS — Z818 Family history of other mental and behavioral disorders: Secondary | ICD-10-CM

## 2019-03-06 DIAGNOSIS — Z8249 Family history of ischemic heart disease and other diseases of the circulatory system: Secondary | ICD-10-CM

## 2019-03-06 DIAGNOSIS — E872 Acidosis: Secondary | ICD-10-CM | POA: Diagnosis present

## 2019-03-06 DIAGNOSIS — B342 Coronavirus infection, unspecified: Secondary | ICD-10-CM | POA: Diagnosis present

## 2019-03-06 DIAGNOSIS — Z6841 Body Mass Index (BMI) 40.0 and over, adult: Secondary | ICD-10-CM

## 2019-03-06 DIAGNOSIS — Z833 Family history of diabetes mellitus: Secondary | ICD-10-CM

## 2019-03-06 LAB — CBC WITH DIFFERENTIAL/PLATELET
Abs Immature Granulocytes: 0.04 10*3/uL (ref 0.00–0.07)
Basophils Absolute: 0 10*3/uL (ref 0.0–0.1)
Basophils Relative: 1 %
Eosinophils Absolute: 0.1 10*3/uL (ref 0.0–0.5)
Eosinophils Relative: 2 %
HCT: 48.3 % — ABNORMAL HIGH (ref 36.0–46.0)
Hemoglobin: 15.7 g/dL — ABNORMAL HIGH (ref 12.0–15.0)
Immature Granulocytes: 1 %
Lymphocytes Relative: 23 %
Lymphs Abs: 1.6 10*3/uL (ref 0.7–4.0)
MCH: 30.7 pg (ref 26.0–34.0)
MCHC: 32.5 g/dL (ref 30.0–36.0)
MCV: 94.5 fL (ref 80.0–100.0)
Monocytes Absolute: 0.6 10*3/uL (ref 0.1–1.0)
Monocytes Relative: 9 %
Neutro Abs: 4.5 10*3/uL (ref 1.7–7.7)
Neutrophils Relative %: 64 %
Platelets: 186 10*3/uL (ref 150–400)
RBC: 5.11 MIL/uL (ref 3.87–5.11)
RDW: 13.6 % (ref 11.5–15.5)
WBC: 6.9 10*3/uL (ref 4.0–10.5)
nRBC: 0 % (ref 0.0–0.2)

## 2019-03-06 LAB — BASIC METABOLIC PANEL
Anion gap: 10 (ref 5–15)
BUN: 10 mg/dL (ref 6–20)
CO2: 25 mmol/L (ref 22–32)
Calcium: 10.5 mg/dL — ABNORMAL HIGH (ref 8.9–10.3)
Chloride: 106 mmol/L (ref 98–111)
Creatinine, Ser: 0.96 mg/dL (ref 0.44–1.00)
GFR calc Af Amer: >60 mL/min (ref >60)
GFR calc non Af Amer: >60 mL/min (ref >60)
Glucose, Bld: 123 mg/dL — ABNORMAL HIGH (ref 70–99)
Potassium: 3.8 mmol/L (ref 3.5–5.1)
Sodium: 141 mmol/L (ref 135–145)

## 2019-03-06 LAB — TROPONIN I (HIGH SENSITIVITY): Troponin I (High Sensitivity): <2 ng/L (ref <18)

## 2019-03-06 IMAGING — CR DG CHEST 1V PORT
1 series · 1 of 1 positions shown · non-contrast
Comparison: [DATE]

CLINICAL DATA: Chest pain, shortness of breath

EXAM:
PORTABLE CHEST 1 VIEW

[portable]
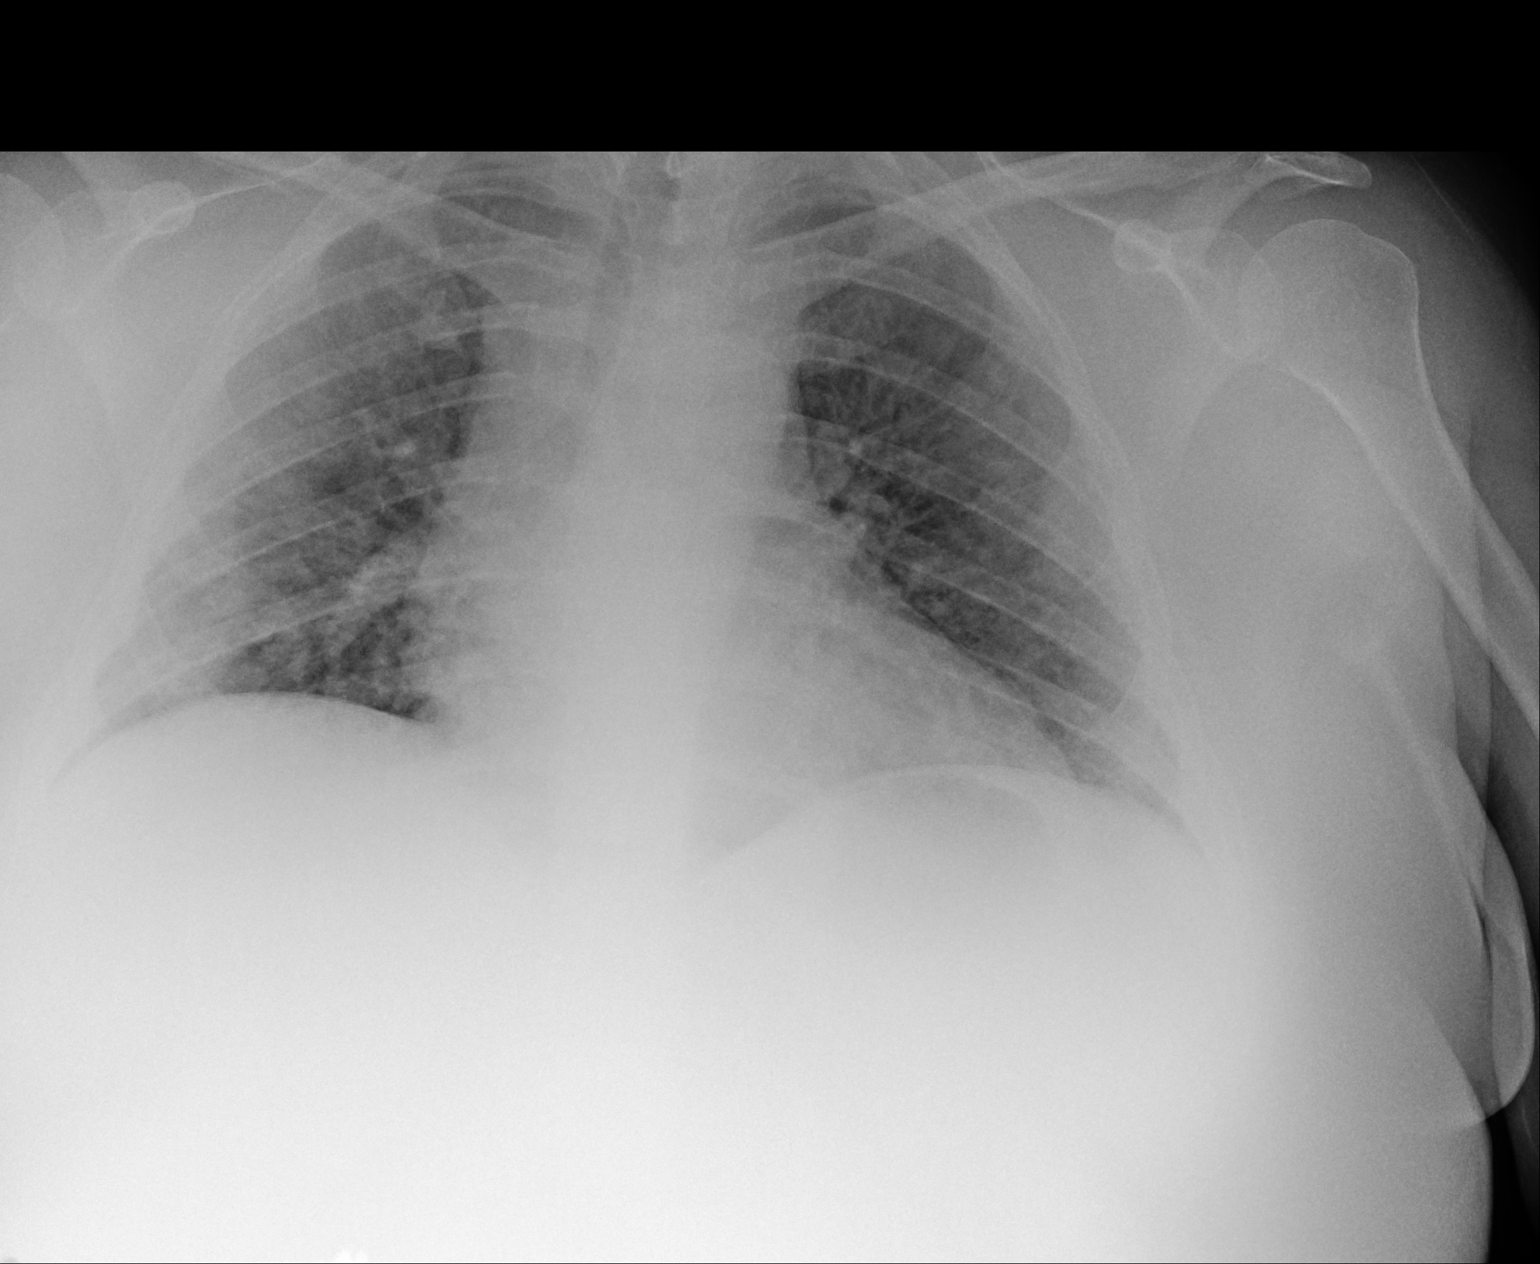

[1 of 1 positions shown; findings below may reference images not displayed]

FINDINGS: Low lung volumes. Heart is borderline in size. Again noted are
bilateral pulmonary infiltrates. Some improvement in lung bases
since prior study. No effusions.
IMPRESSION: Low lung volumes. Bilateral pulmonary infiltrates with improvement
in the lung bases since prior study.

## 2019-03-06 NOTE — ED Triage Notes (Addendum)
Patient complaining of shortness of breath starting this morning. States she was covid positive in August and admitted to [facility]. States last covid test was negative on August 28. Patient labored breathing at triage. O2 on room air was 94%, Respirations 30 bpm. Placed patient on 2L O2 in triage. Patient also complaining of chest pain radiating into left shoulder and left arm.

## 2019-03-06 NOTE — Progress Notes (Signed)
Based on what you shared with me it looks like you have chronic problem ,that should be evaluated in a face to face office visit. You will need to wait until tomorrow when you see your PCP for refills on dexamethasone.we do not fil chronic meds in an e visit.   NOTE: If you entered your credit card information for this eVisit, you will not be charged. You may see a "hold" on your card for the $30 but that hold will drop off and you will not have a charge processed.  If you are having a true medical emergency please call 911.  If you need an urgent face to face visit, [place] has four urgent care centers for your convenience.   Your e-visit answers were reviewed by a board certified advanced clinical practitioner to complete your personal care plan.

## 2019-03-06 NOTE — ED Provider Notes (Signed)
[facility] EMERGENCY DEPARTMENT Provider Note   CSN: [id number] Arrival date & time: 03/06/19  2053     History   Chief Complaint Chief Complaint  Patient presents with  . Shortness of Breath    HPI Paula King is a 38 y.o. female.     Patient with history of obesity, Cushing syndrome, recent coronavirus infection hospitalized at [facility] from August 16- August 24 treated with steroids and and Remdesivir.  States she was going to get home oxygen but did not get it because of insurance issues and was sent home without oxygen.  States she was doing relatively well until 24 hours ago when she had shortness of breath that did not improve with using albuterol at home.  Denies any history of asthma and does not use albuterol on a regular basis.  She is had increasing shortness of breath and pressure in her chest throughout the day today.  The pressure in her chest has been constant since she was discharged in the hospital but is now more severe.  Cough is nonproductive.  No fever.  No leg pain or leg swelling.  She does not smoke.  She has seen her PCP since she was discharged was given another course of Decadron which she completed.  States her O2 sats is in the low 90s at home but today they were in the high 80s so she wanted to come in and get checked out because she was more short of breath and having more chest pain.  The history is provided by the patient.  Shortness of Breath Associated symptoms: chest pain and cough   Associated symptoms: no abdominal pain, no fever, no headaches, no rash and no vomiting     Past Medical History:  Diagnosis Date  . 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[place])   . Diverticulitis   . Hypertension   . Preeclampsia     Patient Active Problem List   Diagnosis Date Noted  . COVID-19 virus infection 02/11/2019  . Acute respiratory failure with hypoxia ([place]) 02/11/2019  . Hypokalemia 02/11/2019  . Abnormal liver function 02/11/2019  . Pregnant  03/17/2018  . H/O severe pre-eclampsia 03/17/2018  . Previous cesarean section 03/17/2018  . Tachycardia 03/17/2018  . Chronic hypertension during pregnancy, antepartum 03/17/2018  . ADD (attention deficit disorder) 08/22/2017  . Cushings syndrome ([place]) 08/16/2017  . Goiter 07/19/2017  . Morbid obesity ([place]) 07/19/2017  . Diverticulitis of colon 08/09/2014  . Abdominal pain 08/09/2014  . Adrenal adenoma, right 08/09/2014  . Diverticulitis 08/09/2014  . Elevated transaminase level 08/09/2014  . Preeclampsia 10/17/2010  . History of cholecystectomy 12/27/2002    Past Surgical History:  Procedure Laterality Date  . CESAREAN SECTION    . CHOLECYSTECTOMY    . WISDOM TOOTH EXTRACTION       OB History    Gravida  5   Para  1   Term      Preterm  1   AB  3   Living  1     SAB  1   TAB      Ectopic      Multiple      Live Births  1            Home Medications    Prior to Admission medications   Medication Sig Start Date End Date Taking? Authorizing Provider  albuterol (VENTOLIN HFA) 108 (90 Base) MCG/ACT inhaler Inhale 2 puffs into the lungs every 4 (four) hours as needed for  wheezing or shortness of breath. 02/21/19   Alycia Rossetti, MD  ARMOUR THYROID 30 MG tablet  01/08/19   [provider]  cetirizine (ZYRTEC) 10 MG tablet Take 10 mg by mouth daily.     [provider]  cholecalciferol (VITAMIN D) 1000 units tablet Take 1,000 Units by mouth daily.  12/26/17   [provider]  Cyanocobalamin (B-12) 1000 MCG TABS Take 1 tablet by mouth daily.  12/26/17   [provider]  dexamethasone (DECADRON) 2 MG tablet Take 6mg  x 2 days, then 4mg  x 2 days, then 2 mg x 2 days, 02/21/19   Alycia Rossetti, MD  ondansetron (ZOFRAN) 4 MG tablet Take 1 tablet (4 mg total) by mouth every 8 (eight) hours as needed for nausea or vomiting. 02/09/19   Chevis Pretty, FNP  phentermine (ADIPEX-P) 37.5 MG tablet Take 37.5 mg by mouth every  morning. 12/16/18   [provider]    Family History Family History  Problem Relation Age of Onset  . Hypertension Father   . Diabetes Father   . Anxiety disorder Mother   . Depression Mother   . Cancer Sister        throat   . Hypertension Paternal Grandmother   . Hypertension Maternal Grandmother   . Asthma Daughter     Social History Social History   Tobacco Use  . Smoking status: Never Smoker  . Smokeless tobacco: Never Used  Substance Use Topics  . Alcohol use: No  . Drug use: No     Allergies   Ciprofloxacin   Review of Systems Review of Systems  Constitutional: Negative for activity change, appetite change and fever.  HENT: Negative for congestion and rhinorrhea.   Respiratory: Positive for cough, chest tightness and shortness of breath.   Cardiovascular: Positive for chest pain.  Gastrointestinal: Negative for abdominal pain, nausea and vomiting.  Genitourinary: Negative for dysuria and hematuria.  Musculoskeletal: Negative for arthralgias and myalgias.  Skin: Negative for rash.  Neurological: Negative for dizziness, weakness and headaches.   all other systems are negative except as noted in the HPI and PMH.     Physical Exam Updated Vital Signs BP (!) 154/115   Pulse (!) 125   Temp 99 F (37.2 C)   Resp (!) 28   Ht 5\' 4"  (1.626 m)   Wt 115.2 kg   LMP  (LMP Unknown)   SpO2 96%   BMI 43.60 kg/m   Physical Exam Vitals signs and nursing note reviewed.  Constitutional:      General: She is in acute distress.     Appearance: She is well-developed. She is obese.     Comments: Mildly increased work of breathing, speaking short sentences  HENT:     Head: Normocephalic and atraumatic.     Mouth/Throat:     Pharynx: No oropharyngeal exudate.  Eyes:     Conjunctiva/sclera: Conjunctivae normal.     Pupils: Pupils are equal, round, and reactive to light.  Neck:     Musculoskeletal: Normal range of motion and neck supple.     Comments:  No meningismus. Cardiovascular:     Rate and Rhythm: Normal rate and regular rhythm.     Heart sounds: Normal heart sounds. No murmur.  Pulmonary:     Effort: Respiratory distress present.     Breath sounds: Normal breath sounds.     Comments: Diminished breath sounds with scattered respiratory wheezes Abdominal:     Palpations: Abdomen is soft.  Tenderness: There is no abdominal tenderness. There is no guarding or rebound.  Musculoskeletal: Normal range of motion.        General: No tenderness.     Right lower leg: No edema.     Left lower leg: No edema.  Skin:    General: Skin is warm.     Capillary Refill: Capillary refill takes less than 2 seconds.  Neurological:     General: No focal deficit present.     Mental Status: She is alert and oriented to person, place, and time. Mental status is at baseline.     Cranial Nerves: No cranial nerve deficit.     Motor: No abnormal muscle tone.     Coordination: Coordination normal.     Comments:  5/5 strength throughout. CN 2-12 intact.Equal grip strength.   Psychiatric:        Behavior: Behavior normal.      ED Treatments / Results  Labs (all labs ordered are listed, but only abnormal results are displayed) Labs Reviewed  SARS CORONAVIRUS 2 ([facility]) - Abnormal; Notable for the following components:      Result Value   SARS Coronavirus 2 POSITIVE (*)    All other components within normal limits  CBC WITH DIFFERENTIAL/PLATELET - Abnormal; Notable for the following components:   Hemoglobin 15.7 (*)    HCT 48.3 (*)    All other components within normal limits  BASIC METABOLIC PANEL - Abnormal; Notable for the following components:   Glucose, Bld 123 (*)    Calcium 10.5 (*)    All other components within normal limits  CULTURE, BLOOD (ROUTINE X 2)  CULTURE, BLOOD (ROUTINE X 2)  D-DIMER, QUANTITATIVE (NOT AT [facility])  LACTIC ACID, PLASMA  LACTIC ACID, PLASMA  PROCALCITONIN   LACTATE DEHYDROGENASE  FERRITIN  TRIGLYCERIDES  FIBRINOGEN  C-REACTIVE PROTEIN  TROPONIN I (HIGH SENSITIVITY)  TROPONIN I (HIGH SENSITIVITY)    EKG EKG Interpretation  Date/Time:  Wednesday March 07 2019 00:03:31 EDT Ventricular Rate:  102 PR Interval:    QRS Duration: 94 QT Interval:  324 QTC Calculation: 422 R Axis:   5 Text Interpretation:  Sinus tachycardia No significant change was found Confirmed by Ezequiel Essex [phone]) on 03/07/2019 12:11:38 AM   Radiology Dg Chest Portable 1 View  Result Date: 03/07/2019 CLINICAL DATA:  Chest pain, shortness of breath EXAM: PORTABLE CHEST 1 VIEW COMPARISON:  02/12/2019 FINDINGS: Low lung volumes. Heart is borderline in size. Again noted are bilateral pulmonary infiltrates. Some improvement in lung bases since prior study. No effusions. IMPRESSION: Low lung volumes. Bilateral pulmonary infiltrates with improvement in the lung bases since prior study. Electronically Signed   By: Rolm Baptise M.D.   On: 03/07/2019 00:05    Procedures Procedures (including critical care time)  Medications Ordered in ED Medications - No data to display   Initial Impression / Assessment and Plan / ED Course  I have reviewed the triage vital signs and the nursing notes.  Pertinent labs & imaging results that were available during my care of the patient were reviewed by me and considered in my medical decision making (see chart for details).       Patient with recent diagnosis of coronavirus presenting with increased shortness of breath since this morning and increased chest pain. She has mild increased work of breathing without wheezing.  She is saturating 96% on 2 L.  EKG is sinus tachycardia.  Patient given albuterol inhalers as  well as IV steroids.  Her chest x-ray shows persistent infiltrates which are improving.  She is dyspneic with conversation but appears better than when she was seen in the ED in August by myself.  Labs are  reassuring.  D-dimer is negative with low suspicion for PE.  Patient has remained tachypneic and has labored breathing with attempted ambulation desaturates to the 80s. She was not given oxygen at home.  She feels like she needs to come back in the hospital. COVID test remains positive.  She is agreeable to admission to [facility] for persistent hypoxic respiratory failure due to COVID-19.  Admission d/w Dr. Clearence Ped.   Paula King was evaluated in Emergency Department on 03/07/2019 for the symptoms described in the history of present illness. She was evaluated in the context of the global COVID-19 pandemic, which necessitated consideration that the patient might be at risk for infection with the SARS-CoV-2 virus that causes COVID-19. Institutional protocols and algorithms that pertain to the evaluation of patients at risk for COVID-19 are in a state of rapid change based on information released by regulatory bodies including the CDC and federal and state organizations. These policies and algorithms were followed during the patient's care in the ED.  CRITICAL CARE Performed by: Ezequiel Essex Total critical care time: 35 minutes Critical care time was exclusive of separately billable procedures and treating other patients. Critical care was necessary to treat or prevent imminent or life-threatening deterioration. Critical care was time spent personally by me on the following activities: development of treatment plan with patient and/or surrogate as well as nursing, discussions with consultants, evaluation of patient's response to treatment, examination of patient, obtaining history from patient or surrogate, ordering and performing treatments and interventions, ordering and review of laboratory studies, ordering and review of radiographic studies, pulse oximetry and re-evaluation of patient's condition.   Final Clinical Impressions(s) / ED Diagnoses   Final diagnoses:  Acute  respiratory failure with hypoxia ([place])  Pneumonia due to COVID-19 virus    ED Discharge Orders    None       Curties Conigliaro, Annie Main, MD 03/07/19 [phone]

## 2019-03-07 ENCOUNTER — Encounter: Payer: Self-pay | Admitting: Family Medicine

## 2019-03-07 ENCOUNTER — Inpatient Hospital Stay (HOSPITAL_COMMUNITY): Payer: No Typology Code available for payment source

## 2019-03-07 ENCOUNTER — Ambulatory Visit: Payer: No Typology Code available for payment source | Admitting: Family Medicine

## 2019-03-07 DIAGNOSIS — Z8619 Personal history of other infectious and parasitic diseases: Secondary | ICD-10-CM | POA: Diagnosis not present

## 2019-03-07 DIAGNOSIS — Z833 Family history of diabetes mellitus: Secondary | ICD-10-CM | POA: Diagnosis not present

## 2019-03-07 DIAGNOSIS — R000 Tachycardia, unspecified: Secondary | ICD-10-CM | POA: Diagnosis present

## 2019-03-07 DIAGNOSIS — N179 Acute kidney failure, unspecified: Secondary | ICD-10-CM | POA: Diagnosis present

## 2019-03-07 DIAGNOSIS — Z8249 Family history of ischemic heart disease and other diseases of the circulatory system: Secondary | ICD-10-CM | POA: Diagnosis not present

## 2019-03-07 DIAGNOSIS — F909 Attention-deficit hyperactivity disorder, unspecified type: Secondary | ICD-10-CM | POA: Diagnosis present

## 2019-03-07 DIAGNOSIS — R079 Chest pain, unspecified: Secondary | ICD-10-CM

## 2019-03-07 DIAGNOSIS — J9621 Acute and chronic respiratory failure with hypoxia: Secondary | ICD-10-CM | POA: Diagnosis present

## 2019-03-07 DIAGNOSIS — I10 Essential (primary) hypertension: Secondary | ICD-10-CM | POA: Diagnosis present

## 2019-03-07 DIAGNOSIS — B342 Coronavirus infection, unspecified: Secondary | ICD-10-CM

## 2019-03-07 DIAGNOSIS — E872 Acidosis: Secondary | ICD-10-CM | POA: Diagnosis present

## 2019-03-07 DIAGNOSIS — U071 COVID-19: Secondary | ICD-10-CM | POA: Diagnosis present

## 2019-03-07 DIAGNOSIS — E669 Obesity, unspecified: Secondary | ICD-10-CM | POA: Diagnosis present

## 2019-03-07 DIAGNOSIS — Z818 Family history of other mental and behavioral disorders: Secondary | ICD-10-CM | POA: Diagnosis not present

## 2019-03-07 DIAGNOSIS — J9601 Acute respiratory failure with hypoxia: Secondary | ICD-10-CM | POA: Diagnosis not present

## 2019-03-07 DIAGNOSIS — I2693 Single subsegmental pulmonary embolism without acute cor pulmonale: Secondary | ICD-10-CM | POA: Diagnosis present

## 2019-03-07 DIAGNOSIS — E249 Cushing's syndrome, unspecified: Secondary | ICD-10-CM | POA: Diagnosis present

## 2019-03-07 DIAGNOSIS — J1289 Other viral pneumonia: Secondary | ICD-10-CM | POA: Diagnosis present

## 2019-03-07 DIAGNOSIS — I2699 Other pulmonary embolism without acute cor pulmonale: Secondary | ICD-10-CM | POA: Diagnosis not present

## 2019-03-07 DIAGNOSIS — Z8616 Personal history of COVID-19: Secondary | ICD-10-CM | POA: Diagnosis present

## 2019-03-07 DIAGNOSIS — Z79899 Other long term (current) drug therapy: Secondary | ICD-10-CM | POA: Diagnosis not present

## 2019-03-07 DIAGNOSIS — K5792 Diverticulitis of intestine, part unspecified, without perforation or abscess without bleeding: Secondary | ICD-10-CM | POA: Diagnosis present

## 2019-03-07 DIAGNOSIS — Z0184 Encounter for antibody response examination: Secondary | ICD-10-CM | POA: Diagnosis not present

## 2019-03-07 DIAGNOSIS — E039 Hypothyroidism, unspecified: Secondary | ICD-10-CM | POA: Diagnosis present

## 2019-03-07 LAB — TROPONIN I (HIGH SENSITIVITY): Troponin I (High Sensitivity): <2 ng/L (ref <18)

## 2019-03-07 LAB — CBC
HCT: 45.8 % (ref 36.0–46.0)
Hemoglobin: 15.5 g/dL — ABNORMAL HIGH (ref 12.0–15.0)
MCH: 31.7 pg (ref 26.0–34.0)
MCHC: 33.8 g/dL (ref 30.0–36.0)
MCV: 93.7 fL (ref 80.0–100.0)
Platelets: 187 10*3/uL (ref 150–400)
RBC: 4.89 MIL/uL (ref 3.87–5.11)
RDW: 13.9 % (ref 11.5–15.5)
WBC: 9.1 10*3/uL (ref 4.0–10.5)
nRBC: 0 % (ref 0.0–0.2)

## 2019-03-07 LAB — APTT: aPTT: 29 seconds (ref 24–36)

## 2019-03-07 LAB — SARS CORONAVIRUS 2 BY RT PCR (HOSPITAL ORDER, PERFORMED IN ~~LOC~~ HOSPITAL LAB): SARS Coronavirus 2: POSITIVE — AB

## 2019-03-07 LAB — ABO/RH: ABO/RH(D): A POS

## 2019-03-07 LAB — LACTIC ACID, PLASMA
Lactic Acid, Venous: 2.4 mmol/L (ref 0.5–1.9)
Lactic Acid, Venous: 1.5 mmol/L (ref 0.5–1.9)

## 2019-03-07 LAB — ECHOCARDIOGRAM LIMITED
Height: 64 in
Weight: 4064 oz

## 2019-03-07 LAB — PROTIME-INR
INR: 1.1 (ref 0.8–1.2)
Prothrombin Time: 14.2 seconds (ref 11.4–15.2)

## 2019-03-07 LAB — LACTATE DEHYDROGENASE: LDH: 219 U/L — ABNORMAL HIGH (ref 98–192)

## 2019-03-07 LAB — HCG, SERUM, QUALITATIVE: Preg, Serum: NEGATIVE

## 2019-03-07 LAB — CREATININE, SERUM
Creatinine, Ser: 1.06 mg/dL — ABNORMAL HIGH (ref 0.44–1.00)
GFR calc Af Amer: >60 mL/min (ref >60)
GFR calc non Af Amer: >60 mL/min (ref >60)

## 2019-03-07 LAB — BRAIN NATRIURETIC PEPTIDE: B Natriuretic Peptide: 29.3 pg/mL (ref 0.0–100.0)

## 2019-03-07 LAB — FERRITIN: Ferritin: 158 ng/mL (ref 11–307)

## 2019-03-07 LAB — FIBRINOGEN: Fibrinogen: 272 mg/dL (ref 210–475)

## 2019-03-07 LAB — TRIGLYCERIDES: Triglycerides: 134 mg/dL (ref <150)

## 2019-03-07 LAB — C-REACTIVE PROTEIN: CRP: <0.8 mg/dL (ref <1.0)

## 2019-03-07 LAB — D-DIMER, QUANTITATIVE: D-Dimer, Quant: 0.45 ug/mL-FEU (ref 0.00–0.50)

## 2019-03-07 LAB — PROCALCITONIN: Procalcitonin: <0.1 ng/mL

## 2019-03-07 IMAGING — CT CT ANGIO CHEST
1 of 4 series · 12 of 30 positions shown · IV contrast (OMNIPAQUE)
Comparison: None
COMPARISON: None

Addendum:
CLINICAL DATA: Evaluate for PE.  Dyspnea.

EXAM:
CT ANGIOGRAPHY CHEST WITH CONTRAST
TECHNIQUE: Multidetector CT imaging of the chest was performed using the
standard protocol during bolus administration of intravenous
contrast. Multiplanar CT image reconstructions and MIPs were
obtained to evaluate the vascular anatomy.
CONTRAST:  100mL OMNIPAQUE IOHEXOL 350 MG/ML SOLN

[Series 4: pe chest · axial · 0.69mm/px · z∈[+318,+528]mm · 12 of 127 slices shown]
[im 11/127  lung]
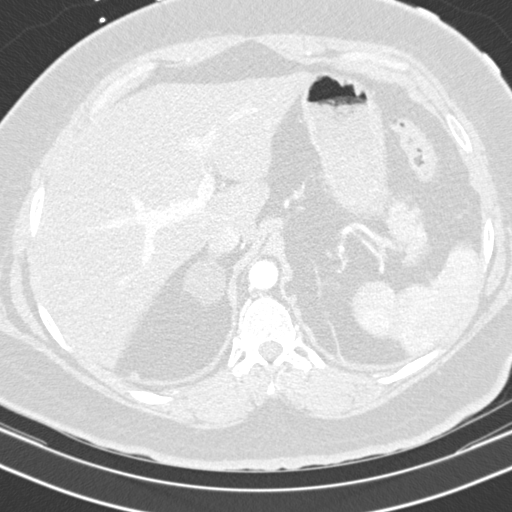
[im 22/127  mediastinal]
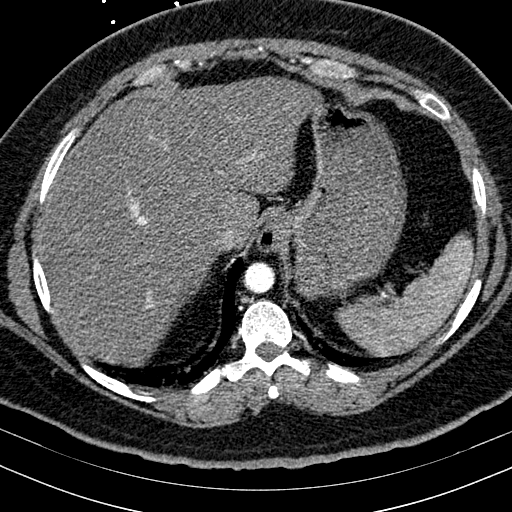
[im 32/127  lung]
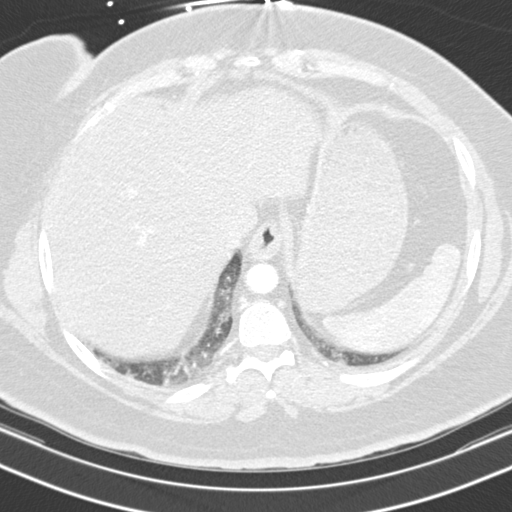
[im 43/127  mediastinal]
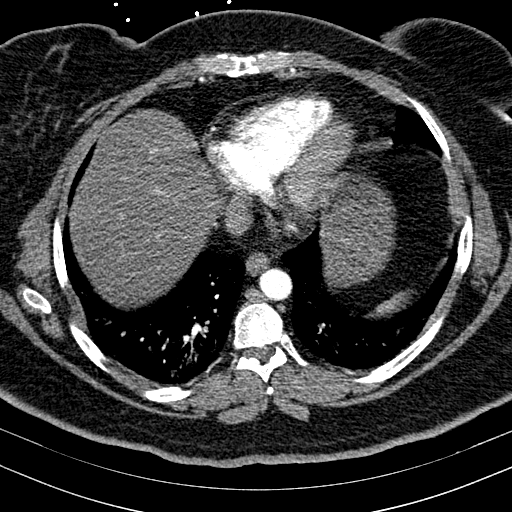
[im 53/127  lung]
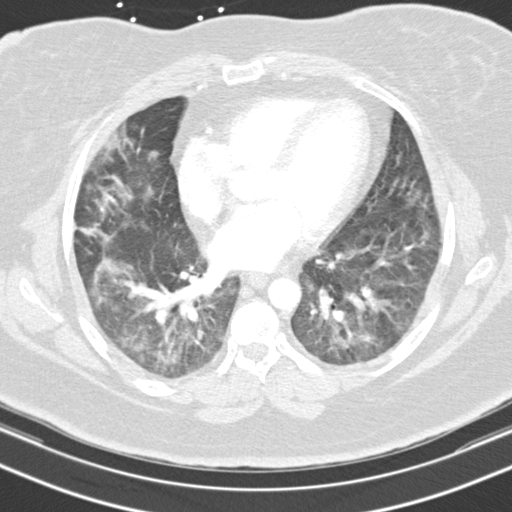
[im 60/127  mediastinal]
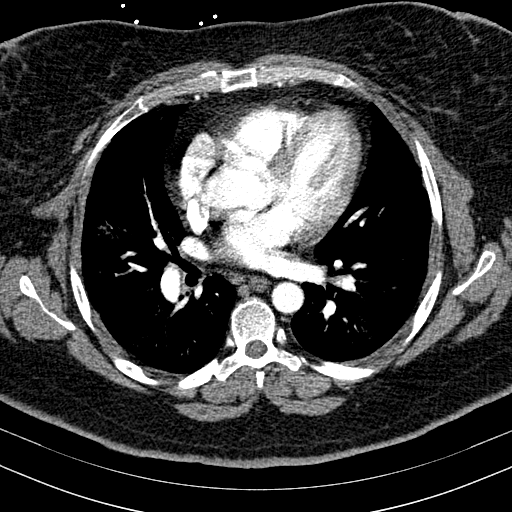
[im 64/127  lung]
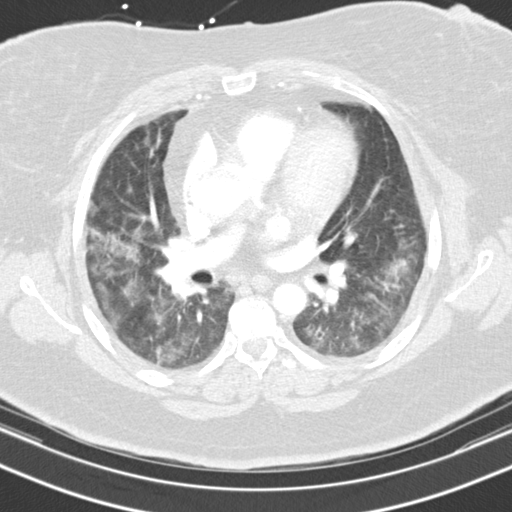
[im 74/127  mediastinal]
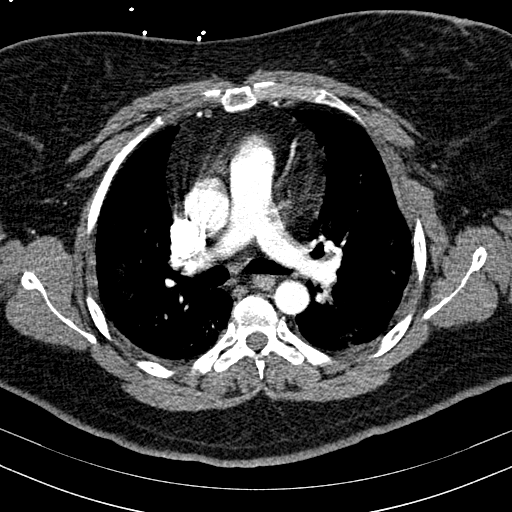
[im 85/127  lung]
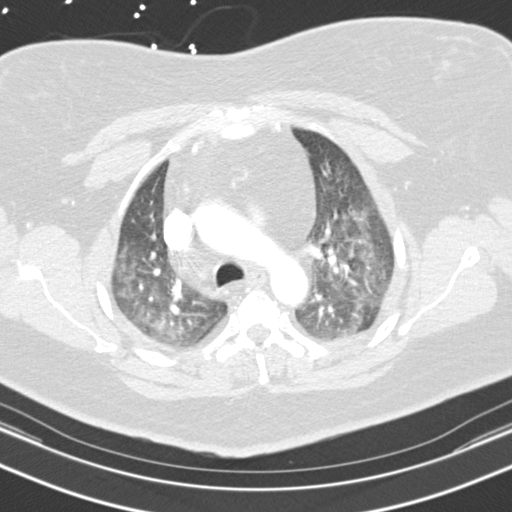
[im 95/127  mediastinal]
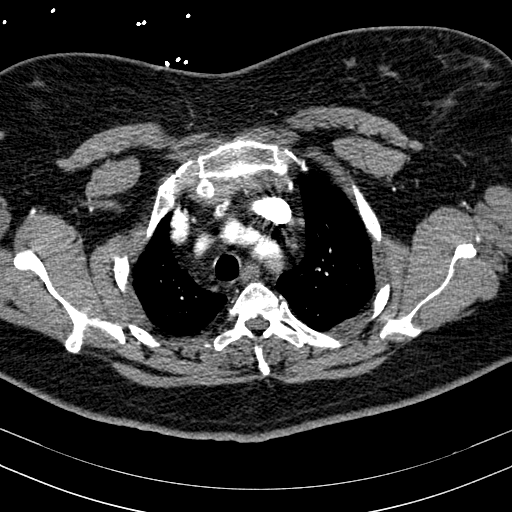
[im 106/127  lung]
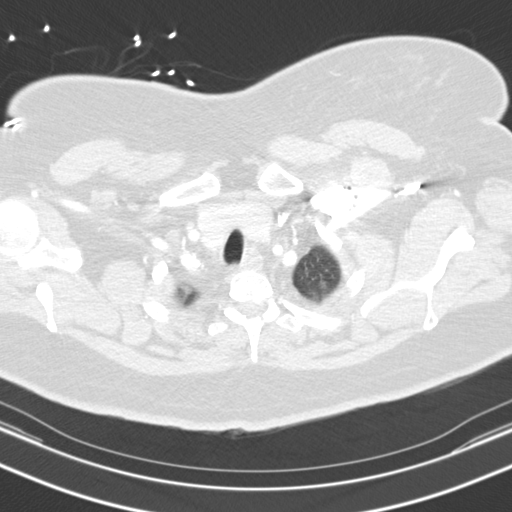
[im 116/127  mediastinal]
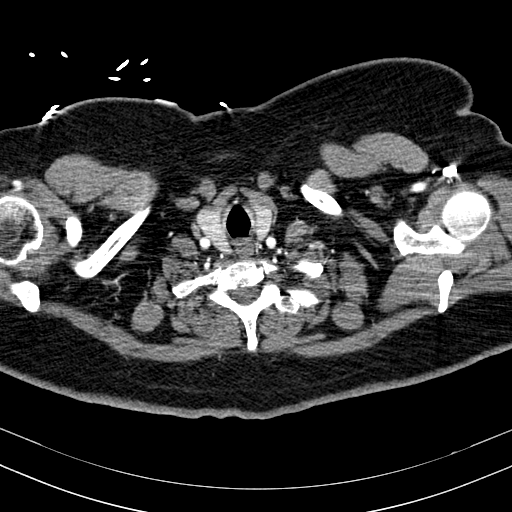

[12 of 30 positions shown; findings below may reference images not displayed]

FINDINGS: Cardiovascular: There is posterior right lower lobe segmental and
subsegmental pulmonary artery filling defect compatible with acute
pulmonary embolus, image 79/5.

Normal heart size.  No pericardial effusion.

Mediastinum/Nodes: Normal appearance of the thyroid gland. Normal
appearance of the thyroid gland. The trachea appears patent and is
midline. No enlarged mediastinal or hilar lymph nodes.

Lungs/Pleura: No pleural effusion. Diffuse bilateral interstitial
opacities are identified within the upper and lower lung zones
compatible with either viral pneumonia or inflammation. No lobar
consolidation.

Upper Abdomen: Unchanged 2.9 cm right adrenal gland adenoma. Hepatic
steatosis. No acute abnormality.

Musculoskeletal: No chest wall abnormality. No acute or significant
osseous findings.

Review of the MIP images confirms the above findings.
IMPRESSION: 1. Exam positive for segmental and subsegmental pulmonary artery
filling defects to the posterior right lower lobe compatible with
acute pulmonary embolus.
2. Diffuse bilateral upper and lower lung zone interstitial
opacities compatible with atypical infection.
3. Hepatic steatosis
4. Unchanged right adrenal gland adenoma.

ADDENDUM:
Critical Value/emergent results were called by telephone at the time
of interpretation on [DATE] at [DATE] to providerDr. T-VUQAR
T-VUQAR , who verbally acknowledged these results.

*** End of Addendum ***
FINDINGS: Cardiovascular: There is posterior right lower lobe segmental and
subsegmental pulmonary artery filling defect compatible with acute
pulmonary embolus, image 79/5.

Normal heart size.  No pericardial effusion.

Mediastinum/Nodes: Normal appearance of the thyroid gland. Normal
appearance of the thyroid gland. The trachea appears patent and is
midline. No enlarged mediastinal or hilar lymph nodes.

Lungs/Pleura: No pleural effusion. Diffuse bilateral interstitial
opacities are identified within the upper and lower lung zones
compatible with either viral pneumonia or inflammation. No lobar
consolidation.

Upper Abdomen: Unchanged 2.9 cm right adrenal gland adenoma. Hepatic
steatosis. No acute abnormality.

Musculoskeletal: No chest wall abnormality. No acute or significant
osseous findings.

Review of the MIP images confirms the above findings.
IMPRESSION: 1. Exam positive for segmental and subsegmental pulmonary artery
filling defects to the posterior right lower lobe compatible with
acute pulmonary embolus.
2. Diffuse bilateral upper and lower lung zone interstitial
opacities compatible with atypical infection.
3. Hepatic steatosis
4. Unchanged right adrenal gland adenoma.

## 2019-03-07 MED ORDER — THYROID 30 MG PO TABS
30.0000 mg | ORAL_TABLET | Freq: Every day | ORAL | Status: DC
  Filled 2019-03-07 (×3): qty 1

## 2019-03-07 MED ORDER — VITAMIN D 25 MCG (1000 UNIT) PO TABS
1000.0000 [IU] | ORAL_TABLET | Freq: Every day | ORAL | Status: DC
  Administered 2019-03-08 – 2019-03-09 (×2): 1000 [IU] via ORAL
  Filled 2019-03-07 (×3): qty 1

## 2019-03-07 MED ORDER — KETOROLAC TROMETHAMINE 30 MG/ML IJ SOLN
30.0000 mg | Freq: Once | INTRAMUSCULAR | Status: AC
  Administered 2019-03-07: 09:00:00 30 mg via INTRAVENOUS
  Filled 2019-03-07: qty 1

## 2019-03-07 MED ORDER — LIDOCAINE VISCOUS HCL 2 % MT SOLN
15.0000 mL | Freq: Once | OROMUCOSAL | Status: AC
  Administered 2019-03-07: 15 mL via ORAL
  Filled 2019-03-07: qty 15

## 2019-03-07 MED ORDER — SODIUM CHLORIDE 0.9 % IV SOLN
1.0000 g | Freq: Once | INTRAVENOUS | Status: AC
  Administered 2019-03-07: 03:00:00 1 g via INTRAVENOUS
  Filled 2019-03-07: qty 10

## 2019-03-07 MED ORDER — ONDANSETRON HCL 4 MG PO TABS: 4.0000 mg | ORAL_TABLET | Freq: Four times a day (QID) | ORAL | Status: DC | PRN

## 2019-03-07 MED ORDER — ALUM & MAG HYDROXIDE-SIMETH 200-200-20 MG/5ML PO SUSP
30.0000 mL | Freq: Once | ORAL | Status: AC
  Administered 2019-03-07: 02:00:00 30 mL via ORAL
  Filled 2019-03-07: qty 30

## 2019-03-07 MED ORDER — METHYLPREDNISOLONE SODIUM SUCC 125 MG IJ SOLR
125.0000 mg | Freq: Once | INTRAMUSCULAR | Status: AC
  Administered 2019-03-07: 02:00:00 125 mg via INTRAVENOUS
  Filled 2019-03-07: qty 2

## 2019-03-07 MED ORDER — SODIUM CHLORIDE 0.9 % IV SOLN
500.0000 mg | Freq: Once | INTRAVENOUS | Status: AC
  Administered 2019-03-07: 500 mg via INTRAVENOUS
  Filled 2019-03-07: qty 500

## 2019-03-07 MED ORDER — ALPRAZOLAM 0.5 MG PO TABS
0.5000 mg | ORAL_TABLET | Freq: Two times a day (BID) | ORAL | Status: DC | PRN
  Administered 2019-03-07: 17:00:00 0.5 mg via ORAL
  Filled 2019-03-07: qty 1

## 2019-03-07 MED ORDER — ENOXAPARIN SODIUM 60 MG/0.6ML ~~LOC~~ SOLN
60.0000 mg | SUBCUTANEOUS | Status: DC
  Administered 2019-03-07: 60 mg via SUBCUTANEOUS
  Filled 2019-03-07: qty 0.6

## 2019-03-07 MED ORDER — SODIUM CHLORIDE 0.9 % IV SOLN
INTRAVENOUS | Status: DC
  Administered 2019-03-07: 05:00:00 via INTRAVENOUS

## 2019-03-07 MED ORDER — ONDANSETRON HCL 4 MG/2ML IJ SOLN: 4.0000 mg | Freq: Four times a day (QID) | INTRAMUSCULAR | Status: DC | PRN

## 2019-03-07 MED ORDER — IOHEXOL 350 MG/ML SOLN
100.0000 mL | Freq: Once | INTRAVENOUS | Status: AC | PRN
  Administered 2019-03-07: 100 mL via INTRAVENOUS

## 2019-03-07 MED ORDER — VITAMIN B-12 1000 MCG PO TABS
1000.0000 ug | ORAL_TABLET | Freq: Every day | ORAL | Status: DC
  Administered 2019-03-08 – 2019-03-09 (×2): 1000 ug via ORAL
  Filled 2019-03-07 (×4): qty 1

## 2019-03-07 MED ORDER — ENOXAPARIN SODIUM 120 MG/0.8ML ~~LOC~~ SOLN
120.0000 mg | Freq: Two times a day (BID) | SUBCUTANEOUS | Status: AC
  Administered 2019-03-07 – 2019-03-09 (×4): 120 mg via SUBCUTANEOUS
  Filled 2019-03-07 (×5): qty 0.8

## 2019-03-07 MED ORDER — SODIUM CHLORIDE 0.9 % IV SOLN: 1.0000 g | INTRAVENOUS | Status: DC

## 2019-03-07 MED ORDER — ONDANSETRON HCL 4 MG/2ML IJ SOLN
INTRAMUSCULAR | Status: AC
  Filled 2019-03-07: qty 2

## 2019-03-07 MED ORDER — ONDANSETRON HCL 4 MG/2ML IJ SOLN
4.0000 mg | Freq: Once | INTRAMUSCULAR | Status: AC
  Administered 2019-03-07: 4 mg via INTRAVENOUS

## 2019-03-07 MED ORDER — SODIUM CHLORIDE 0.9 % IV SOLN: 500.0000 mg | INTRAVENOUS | Status: DC

## 2019-03-07 MED ORDER — ACETAMINOPHEN 325 MG PO TABS
650.0000 mg | ORAL_TABLET | Freq: Four times a day (QID) | ORAL | Status: DC | PRN
  Filled 2019-03-07: qty 2

## 2019-03-07 MED ORDER — ONDANSETRON HCL 4 MG/2ML IJ SOLN: 4.0000 mg | Freq: Once | INTRAMUSCULAR | Status: DC

## 2019-03-07 MED ORDER — KETOROLAC TROMETHAMINE 30 MG/ML IJ SOLN
30.0000 mg | Freq: Once | INTRAMUSCULAR | Status: AC
  Administered 2019-03-07: 05:00:00 30 mg via INTRAVENOUS
  Filled 2019-03-07: qty 1

## 2019-03-07 MED ORDER — ALBUTEROL SULFATE HFA 108 (90 BASE) MCG/ACT IN AERS
6.0000 | INHALATION_SPRAY | Freq: Once | RESPIRATORY_TRACT | Status: AC
  Administered 2019-03-07: 02:00:00 6 via RESPIRATORY_TRACT
  Filled 2019-03-07: qty 6.7

## 2019-03-07 MED ORDER — ONDANSETRON HCL 4 MG/5ML PO SOLN: 4.0000 mg | Freq: Once | ORAL | Status: DC

## 2019-03-07 NOTE — Progress Notes (Addendum)
PROGRESS NOTE    Paula King  [id number] DOB: 1980-11-03 DOA: 03/06/2019 PCP: Alycia Rossetti, MD      Brief Narrative:  Paula King is a 38 y.o. F with obesity, Cushing's syndrome,    Assessment & Plan:  Coronavirus pneumonitis with acute hypoxic respiratory failure In setting of ongoing 2020 COVID-19 pandemic. Resolving at time of discharge. Should have been discharged with O2, couldn't for personal and insurance reasons.  No role for remdesivir or steroids at this point. -Check CoV Abs -VTE PPx with Lovenox   Chest pain, dyspnea, tachycardia Unclear if this is from resolving COVID plus anxiety, not treated with O2 or if it is from PE.  Doubt pneumonia given absence of fever, sputum, leukocytosis or procal elevation. -Hold antibiotics and monitor fever curve, procal -Obtain CT chest angiogram  ADDENDUM:  PE found Check Hcg If neg, start Lovenox Obtain echo PESI 78, low risk, class II   Possible Cushing's syndrome Adrenal incidentaloma in 2016, RIGHT adrenal.  Repeat CT 2018 no growth, HU density favors adenoma.  Subsequently metanephrines normal but first 24-hour urine free cortisol elevated.    Dexamethasone suppression test however suggested this was NOT ACTH dependent, and repeat 24-urine cortisol was normal.  Currently, Endo (Dr. Dorris Fetch) is following this clinically, and would like to repeat her dex-suppression test.  I do not think this is pertinent to her current admission, other than theoretically Cushing's is a pro-coagulable state.   Sinus tachycardia Long-standing problem. Basleine 100s. Treated at home intermittently with atenolol.         MDM and disposition Plan: This is a no charge note.  For further details, please see H&P by my partner Dr. Clearence Ped from earlier today.  The below labs and imaging reports were reviewed and summarized above.    The patient was admitted with dyspnea, cough, hypoxia.      DVT  prophylaxis: Lovenox Code Status: FULL Family Communication: Father     Antimicrobials:   Ceftriaxone x1 03/07/2019  Azithromycin x1 9/9    Culture data:   9/9 blood culture x2 NGTD  COVID repeat positive 9/9, cycle threshold 31 for E probe, 34.7 for N2 probe       Subjective: Feels tired.  No orthopnea.  No leg swelling.  Chest feels heavy.  Objective: Vitals:   03/07/19 0849 03/07/19 0900 03/07/19 0915 03/07/19 0930  BP:  102/78  111/77  Pulse:  (!) 118 (!) 123 (!) 117  Resp:  19 19 (!) 24  Temp: 99.2 F (37.3 C)     TempSrc: Oral     SpO2:  96% 97% 92%  Weight:      Height:        Intake/Output Summary (Last 24 hours) at 03/07/2019 1201 Last data filed at 03/07/2019 0433 Gross per 24 hour  Intake 350 ml  Output -  Net 350 ml   Filed Weights   03/06/19 2116  Weight: 115.2 kg    Examination: General appearance: obese adult female, alert and in no acute distress.   HEENT: Anicteric, conjunctiva pink, lids and lashes normal. No nasal deformity, discharge, epistaxis.  Lips moist.   Skin: Warm and dry.  no jaundice.  No suspicious rashes or lesions. Cardiac: RRR, nl S1-S2, no murmurs appreciated.  Capillary refill is brisk.  JVP not visible.  No LE edema.  Radial pulses 2+ and symmetric. Respiratory: Normal respiratory rate and rhythm.  CTAB without rales or wheezes. Abdomen: Abdomen soft.  No TTP.  No ascites, distension, hepatosplenomegaly.   MSK: No deformities or effusions. Neuro: Awake and alert.  EOMI, moves all extremities. Speech fluent.    Psych: Sensorium intact and responding to questions, attention normal. Affect normal.  Judgment and insight appear normal.      Data Reviewed: I have personally reviewed following labs and imaging studies:  CBC: Recent Labs  Lab 03/06/19 2132  WBC 6.9  NEUTROABS 4.5  HGB 15.7*  HCT 48.3*  MCV 94.5  PLT 99991111   Basic Metabolic Panel: Recent Labs  Lab 03/06/19 2132  NA 141  K 3.8  CL 106  CO2 25   GLUCOSE 123*  BUN 10  CREATININE 0.96  CALCIUM 10.5*   GFR: Estimated Creatinine Clearance: 99 mL/min (by C-G formula based on SCr of 0.96 mg/dL). Liver Function Tests: No results for input(s): AST, ALT, ALKPHOS, BILITOT, PROT, ALBUMIN in the last 168 hours. No results for input(s): LIPASE, AMYLASE in the last 168 hours. No results for input(s): AMMONIA in the last 168 hours. Coagulation Profile: No results for input(s): INR, PROTIME in the last 168 hours. Cardiac Enzymes: No results for input(s): CKTOTAL, CKMB, CKMBINDEX, TROPONINI in the last 168 hours. BNP (last 3 results) No results for input(s): PROBNP in the last 8760 hours. HbA1C: No results for input(s): HGBA1C in the last 72 hours. CBG: No results for input(s): GLUCAP in the last 168 hours. Lipid Profile: Recent Labs    03/06/19 2132  TRIG 134   Thyroid Function Tests: No results for input(s): TSH, T4TOTAL, FREET4, T3FREE, THYROIDAB in the last 72 hours. Anemia Panel: Recent Labs    03/06/19 2132  FERRITIN 158   Urine analysis:    Component Value Date/Time   COLORURINE YELLOW 08/09/2014 1405   APPEARANCEUR CLOUDY (A) 08/09/2014 1405   LABSPEC 1.025 04/18/2017 0939   PHURINE 5.0 04/18/2017 0939   GLUCOSEU NEGATIVE 04/18/2017 0939   HGBUR NEGATIVE 04/18/2017 0939   BILIRUBINUR Negative 08/21/2017 [place] 04/18/2017 0939   PROTEINUR Negative 08/21/2017 1227   PROTEINUR NEGATIVE 04/18/2017 0939   UROBILINOGEN 0.2 08/21/2017 1227   UROBILINOGEN 0.2 04/18/2017 0939   NITRITE Negative 08/21/2017 1227   NITRITE NEGATIVE 04/18/2017 0939   LEUKOCYTESUR Negative 08/21/2017 1227   Sepsis Labs: @LABRCNTIP (procalcitonin:4,lacticacidven:4)  ) Recent Results (from the past 240 hour(s))  SARS Coronavirus 2 [facility] order, Performed in [facility] hospital lab) Nasopharyngeal Nasopharyngeal Swab     Status: Abnormal   Collection Time: 03/07/19 12:42 AM   Specimen: Nasopharyngeal Swab  Result  Value Ref Range Status   SARS Coronavirus 2 POSITIVE (A) NEGATIVE Final    Comment: RESULT CALLED TO, READ BACK BY AND VERIFIED WITH: H LONG,RN @0149  03/07/19 MKELLY (NOTE) If result is NEGATIVE SARS-CoV-2 target nucleic acids are NOT DETECTED. The SARS-CoV-2 RNA is generally detectable in upper and lower  respiratory specimens during the acute phase of infection. The lowest  concentration of SARS-CoV-2 viral copies this assay can detect is 250  copies / mL. A negative result does not preclude SARS-CoV-2 infection  and should not be used as the sole basis for treatment or other  patient management decisions.  A negative result may occur with  improper specimen collection / handling, submission of specimen other  than nasopharyngeal swab, presence of viral mutation(s) within the  areas targeted by this assay, and inadequate number of viral copies  (<250 copies / mL). A negative result must be combined with clinical  observations, patient history, and epidemiological information. If  result is POSITIVE SARS-CoV-2 target nucleic acids are DETECTED. The S ARS-CoV-2 RNA is generally detectable in upper and lower  respiratory specimens during the acute phase of infection.  Positive  results are indicative of active infection with SARS-CoV-2.  Clinical  correlation with patient history and other diagnostic information is  necessary to determine patient infection status.  Positive results do  not rule out bacterial infection or co-infection with other viruses. If result is PRESUMPTIVE POSTIVE SARS-CoV-2 nucleic acids MAY BE PRESENT.   A presumptive positive result was obtained on the submitted specimen  and confirmed on repeat testing.  While 2019 novel coronavirus  (SARS-CoV-2) nucleic acids may be present in the submitted sample  additional confirmatory testing may be necessary for epidemiological  and / or clinical management purposes  to differentiate between  SARS-CoV-2 and other  Sarbecovirus currently known to infect humans.  If clinically indicated additional testing with an alternate test  methodology [phone]) is adv ised. The SARS-CoV-2 RNA is generally  detectable in upper and lower respiratory specimens during the acute  phase of infection. The expected result is Negative. Fact Sheet for Patients:  StrictlyIdeas.no Fact Sheet for Healthcare Providers: BankingDealers.co.za This test is not yet approved or cleared by the [place] FDA and has been authorized for detection and/or diagnosis of SARS-CoV-2 by FDA under an Emergency Use Authorization (EUA).  This EUA will remain in effect (meaning this test can be used) for the duration of the COVID-19 declaration under Section 564(b)(1) of the Act, 21 U.S.C. section 360bbb-3(b)(1), unless the authorization is terminated or revoked sooner. Performed at [facility], [address], [place], [place] [address]   Blood Culture (routine x 2)     Status: None (Preliminary result)   Collection Time: 03/07/19  3:08 AM   Specimen: Vein; Blood  Result Value Ref Range Status   Specimen Description BLOOD LEFT ANTECUBITAL  Final   Special Requests   Final    BOTTLES DRAWN AEROBIC AND ANAEROBIC Blood Culture adequate volume   Culture   Final    NO GROWTH < 12 HOURS Performed at [facility], [address], [place], [place] [address]    Report Status PENDING  Incomplete  Blood Culture (routine x 2)     Status: None (Preliminary result)   Collection Time: 03/07/19  3:11 AM   Specimen: Vein; Blood  Result Value Ref Range Status   Specimen Description BLOOD RIGHT ANTECUBITAL  Final   Special Requests   Final    BOTTLES DRAWN AEROBIC AND ANAEROBIC Blood Culture adequate volume   Culture   Final    NO GROWTH < 12 HOURS Performed at [facility], [address] [place], [place] [address]    Report Status PENDING  Incomplete         Radiology Studies: Dg Chest  Portable 1 View  Result Date: 03/07/2019 CLINICAL DATA:  Chest pain, shortness of breath EXAM: PORTABLE CHEST 1 VIEW COMPARISON:  02/12/2019 FINDINGS: Low lung volumes. Heart is borderline in size. Again noted are bilateral pulmonary infiltrates. Some improvement in lung bases since prior study. No effusions. IMPRESSION: Low lung volumes. Bilateral pulmonary infiltrates with improvement in the lung bases since prior study. Electronically Signed   By: Rolm Baptise M.D.   On: 03/07/2019 00:05        Scheduled Meds: . cholecalciferol  1,000 Units Oral Daily  . enoxaparin (LOVENOX) injection  60 mg Subcutaneous Q24H  . [START ON 03/08/2019] thyroid  30 mg Oral QAC  breakfast  . vitamin B-12  1,000 mcg Oral Daily   Continuous Infusions: . sodium chloride 75 mL/hr at 03/07/19 0451     LOS: 0 days    Time spent: 15 minutes      Edwin Dada, MD [facility] 03/07/2019, 12:01 PM     Please page through [place]:  [email or web address] Password [email or web address] If 7PM-7AM, please contact night-coverage

## 2019-03-07 NOTE — ED Notes (Signed)
Dr Tat in to see pt.  

## 2019-03-07 NOTE — TOC Initial Note (Signed)
Transition of Care [facility]) - Initial/Assessment Note    Patient Details  Name: Paula King MRN: [id number] Date of Birth: 1981/02/22  Transition of Care [facility]) CM/SW Contact:    Purcell Mouton, RN Phone Number: 03/07/2019, 11:00 AM  Clinical Narrative:                 9/9 Readmission. 38yo female from home was at [facility] 8/16-8/25 with Acute respiratory failure secondary to COVID. Discharged home without O2 related to her insurance. Pt developed Worsen symptoms on 9/8 O2 sats in the 80's. 9/9 pt is COVID+.         Patient Goals and CMS Choice        Expected Discharge Plan and Services                                                Prior Living Arrangements/Services                       Activities of Daily Living      Permission Sought/Granted                  Emotional Assessment              Admission diagnosis:  Acute respiratory failure with hypoxia ([place]) [J96.01] Pneumonia due to COVID-19 virus [U07.1, J12.89] Patient Active Problem List   Diagnosis Date Noted  . Coronavirus infection 03/07/2019  . COVID-19 virus infection 02/11/2019  . Acute respiratory failure with hypoxia ([place]) 02/11/2019  . Hypokalemia 02/11/2019  . Abnormal liver function 02/11/2019  . Pregnant 03/17/2018  . H/O severe pre-eclampsia 03/17/2018  . Previous cesarean section 03/17/2018  . Tachycardia 03/17/2018  . Chronic hypertension during pregnancy, antepartum 03/17/2018  . ADD (attention deficit disorder) 08/22/2017  . Cushings syndrome ([place]) 08/16/2017  . Goiter 07/19/2017  . Morbid obesity ([place]) 07/19/2017  . Diverticulitis of colon 08/09/2014  . Abdominal pain 08/09/2014  . Adrenal adenoma, right 08/09/2014  . Diverticulitis 08/09/2014  . Elevated transaminase level 08/09/2014  . Preeclampsia 10/17/2010  . History of cholecystectomy 12/27/2002   PCP:  Alycia Rossetti, MD Pharmacy:   [place], [place]. [address] [place] [address] Phone: [phone] Fax: [phone]     Social Determinants of Health (SDOH) Interventions    Readmission Risk Interventions No flowsheet data found.

## 2019-03-07 NOTE — Progress Notes (Signed)
ANTICOAGULATION CONSULT NOTE - Initial Consult  Pharmacy Consult for Lovenox Indication: pulmonary embolus  Allergies  Allergen Reactions  . Ciprofloxacin     IV Only-Patient started sweating and becoming clammy when was infused with IV cipro on 08/09/14. Can tolerate PO Ciprofloxacin     Patient Measurements: Height: 5\' 4"  (162.6 cm) Weight: 254 lb (115.2 kg) IBW/kg (Calculated) : 54.7  Vital Signs: Temp: 98.9 F (37.2 C) (09/09 1636) Temp Source: Oral (09/09 1636) BP: 136/96 (09/09 1636) Pulse Rate: 109 (09/09 1636)  Labs: Recent Labs    03/06/19 2132 03/06/19 2332 03/07/19 1122  HGB 15.7*  --  15.5*  HCT 48.3*  --  45.8  PLT 186  --  187  CREATININE 0.96  --  1.06*  TROPONINIHS <2.0 <2.0  --     Estimated Creatinine Clearance: 89.6 mL/min (A) (by C-G formula based on SCr of 1.06 mg/dL (H)).   Medical History: Past Medical History:  Diagnosis Date  . 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[place])   . Diverticulitis   . Hypertension   . Preeclampsia     Medications:  Scheduled:  . cholecalciferol  1,000 Units Oral Daily  . enoxaparin (LOVENOX) injection  60 mg Subcutaneous Q24H  . [START ON 03/08/2019] thyroid  30 mg Oral QAC breakfast  . vitamin B-12  1,000 mcg Oral Daily   Infusions:   PRN: acetaminophen, ondansetron **OR** ondansetron (ZOFRAN) IV  Assessment: 38 yo female with Cushing's disease, morbid obesity, hypothyroidism presenting with 1 day history of chest discomfort and shortness of breath. She was recently admitted 8/16-8/24 with COVID-19.  CTa shows acute PE, pharmacy consulted to dose Lovenox.  Received prophylactic dose Lovenox 60mg  on 9/9 at 12:50. CBC wnl, SCr 1.06 with CrCl ~90 ml/min.  Goal of Therapy:  Anti-Xa level 0.6-1 units/ml 4hrs after LMWH dose given Monitor platelets by anticoagulation protocol: Yes   Plan:   Lovenox 120mg  (1mg /kg) SQ q12h   CBC at least q72h  Monitor for signs/symptoms of bleeding  Peggyann Juba, PharmD,  BCPS Pharmacy: [phone] 03/07/2019,4:49 PM

## 2019-03-07 NOTE — Progress Notes (Signed)
PROGRESS NOTE  Paula King [id number] DOB: June 04, 1981 DOA: 03/06/2019 PCP: Alycia Rossetti, MD  Brief History:  38 year old female with a history of Cushing's disease, morbid obesity, hypothyroidism presenting with 1 day history of chest discomfort and shortness of breath.  The patient was recently better to the hospital from 02/11/2019 to 02/19/2019 for acute respiratory failure secondary to COVID-19 disease.  The patient was treated with remdesivir, steroids, and tocilizumab.  She was discharged home to finish dexamethasone on 02/20/2019.  The patient received an extra course of dexamethasone from her prior PCP which she finished approximately 10 days prior to this admission.  Since discharge home, the patient had been fairly stable but having some mild dyspnea on exertion.  Unfortunately, her symptoms worsened on 03/06/2019.  She complained of nonproductive cough with substernal chest discomfort that was sharp in nature.  She stated that this occurred while she was watching television.  She checked her oxygen and it was in the 80s.  As result, the patient presented for further evaluation.  In the emergency department, the patient had temperature 99.0 F with tachycardia in the 120s.  She was hemodynamically stable.  Oxygen saturation was 85% on room air, but improved to 95% on 2 L.  Assessment/Plan: Acute on chronic respiratory failure with hypoxia  -The patient was discharged home without oxygen secondary to her personal choice due to insurance issues -The patient actually had oxygen desaturation at the time of discharge. -Given the patient's tachycardia, chest discomfort, shortness of breath, obtain CT angiogram chest rule out PE -Suspect the patient has other etiology for her hypoxia -Discontinue ceftriaxone and azithromycin with negative procalcitonin  COVID-19 positivity -The patient is greater than 21 days out from her initial positive test with essentially negative  inflammatory markers in the ED 03/06/2019 -The patient is not likely infectious at this point--doubt persistent or new infection  Atypical chest discomfort -Atypical by clinical history -Troponins <2 x 2 -Personally reviewed EKG--sinus tachycardia, nonspecific T wave change -Echocardiogram -CT angiogram chest rule out PE -prn toradol for now  Hypothyroidism -Continue [place] Thyroid  Sinus tachycardia -Rule out PE -May also be due to the patient's phentermine       Disposition Plan:   Transfer to [facility]  Family Communication:  No Family at bedside  Consultants:  none  Code Status:  FULL   DVT Prophylaxis:  [place] Lovenox   Procedures: As Listed in Progress Note Above  Antibiotics: Ceftriaxone/Azithro x 1 in ED   Total time spent 35 minutes.  Greater than 50% spent face to face counseling and coordinating care.    Subjective: Patient continues to complain of some intermittent chest discomfort and some shortness of breath.  She has a nonproductive cough.  She denies any fevers, chills, nausea, vomiting, abdominal pain, dysuria, hematuria.  She has some loose stools but there is no hemoptysis or hematochezia.  Objective: Vitals:   03/07/19 0700 03/07/19 0730 03/07/19 0745 03/07/19 0800  BP: 101/66 116/84  116/77  Pulse: (!) 106 (!) 124 (!) 120 (!) 123  Resp: 20 (!) 22 (!) 23 (!) 21  Temp:      SpO2: 96% 97% 97% 91%  Weight:      Height:        Intake/Output Summary (Last 24 hours) at 03/07/2019 0823 Last data filed at 03/07/2019 0433 Gross per 24 hour  Intake 350 ml  Output -  Net 350 ml   Weight change:  Exam:   General:  Pt is alert, follows commands appropriately, not in acute distress  HEENT: No icterus, No thrush, No neck mass, /AT  Cardiovascular: RRR, S1/S2, no rubs, no gallops  Respiratory: Scattered bilateral rales but no wheezing.  Good air movement.  Abdomen: Soft/+BS, non tender, non distended, no guarding  Extremities: No edema, No  lymphangitis, No petechiae, No rashes, no synovitis   Data Reviewed: I have personally reviewed following labs and imaging studies Basic Metabolic Panel: Recent Labs  Lab 03/06/19 2132  NA 141  K 3.8  CL 106  CO2 25  GLUCOSE 123*  BUN 10  CREATININE 0.96  CALCIUM 10.5*   Liver Function Tests: No results for input(s): AST, ALT, ALKPHOS, BILITOT, PROT, ALBUMIN in the last 168 hours. No results for input(s): LIPASE, AMYLASE in the last 168 hours. No results for input(s): AMMONIA in the last 168 hours. Coagulation Profile: No results for input(s): INR, PROTIME in the last 168 hours. CBC: Recent Labs  Lab 03/06/19 2132  WBC 6.9  NEUTROABS 4.5  HGB 15.7*  HCT 48.3*  MCV 94.5  PLT 186   Cardiac Enzymes: No results for input(s): CKTOTAL, CKMB, CKMBINDEX, TROPONINI in the last 168 hours. BNP: Invalid input(s): POCBNP CBG: No results for input(s): GLUCAP in the last 168 hours. HbA1C: No results for input(s): HGBA1C in the last 72 hours. Urine analysis:    Component Value Date/Time   COLORURINE YELLOW 08/09/2014 1405   APPEARANCEUR CLOUDY (A) 08/09/2014 1405   LABSPEC 1.025 04/18/2017 0939   PHURINE 5.0 04/18/2017 0939   GLUCOSEU NEGATIVE 04/18/2017 0939   HGBUR NEGATIVE 04/18/2017 0939   BILIRUBINUR Negative 08/21/2017 1227   KETONESUR NEGATIVE 04/18/2017 0939   PROTEINUR Negative 08/21/2017 1227   PROTEINUR NEGATIVE 04/18/2017 0939   UROBILINOGEN 0.2 08/21/2017 1227   UROBILINOGEN 0.2 04/18/2017 0939   NITRITE Negative 08/21/2017 1227   NITRITE NEGATIVE 04/18/2017 0939   LEUKOCYTESUR Negative 08/21/2017 1227   Sepsis Labs: @LABRCNTIP (procalcitonin:4,lacticidven:4) ) Recent Results (from the past 240 hour(s))  SARS Coronavirus 2 [facility] order, Performed in [facility] hospital lab) Nasopharyngeal Nasopharyngeal Swab     Status: Abnormal   Collection Time: 03/07/19 12:42 AM   Specimen: Nasopharyngeal Swab  Result Value Ref Range Status   SARS Coronavirus 2  POSITIVE (A) NEGATIVE Final    Comment: RESULT CALLED TO, READ BACK BY AND VERIFIED WITH: H LONG,RN @0149  03/07/19 MKELLY (NOTE) If result is NEGATIVE SARS-CoV-2 target nucleic acids are NOT DETECTED. The SARS-CoV-2 RNA is generally detectable in upper and lower  respiratory specimens during the acute phase of infection. The lowest  concentration of SARS-CoV-2 viral copies this assay can detect is 250  copies / mL. A negative result does not preclude SARS-CoV-2 infection  and should not be used as the sole basis for treatment or other  patient management decisions.  A negative result may occur with  improper specimen collection / handling, submission of specimen other  than nasopharyngeal swab, presence of viral mutation(s) within the  areas targeted by this assay, and inadequate number of viral copies  (<250 copies / mL). A negative result must be combined with clinical  observations, patient history, and epidemiological information. If result is POSITIVE SARS-CoV-2 target nucleic acids are DETECTED. The S ARS-CoV-2 RNA is generally detectable in upper and lower  respiratory specimens during the acute phase of infection.  Positive  results are indicative of active infection with SARS-CoV-2.  Clinical  correlation with patient history and other diagnostic information  is  necessary to determine patient infection status.  Positive results do  not rule out bacterial infection or co-infection with other viruses. If result is PRESUMPTIVE POSTIVE SARS-CoV-2 nucleic acids MAY BE PRESENT.   A presumptive positive result was obtained on the submitted specimen  and confirmed on repeat testing.  While 2019 novel coronavirus  (SARS-CoV-2) nucleic acids may be present in the submitted sample  additional confirmatory testing may be necessary for epidemiological  and / or clinical management purposes  to differentiate between  SARS-CoV-2 and other Sarbecovirus currently known to infect humans.  If  clinically indicated additional testing with an alternate test  methodology [phone]) is adv ised. The SARS-CoV-2 RNA is generally  detectable in upper and lower respiratory specimens during the acute  phase of infection. The expected result is Negative. Fact Sheet for Patients:  StrictlyIdeas.no Fact Sheet for Healthcare Providers: BankingDealers.co.za This test is not yet approved or cleared by the [place] FDA and has been authorized for detection and/or diagnosis of SARS-CoV-2 by FDA under an Emergency Use Authorization (EUA).  This EUA will remain in effect (meaning this test can be used) for the duration of the COVID-19 declaration under Section 564(b)(1) of the Act, 21 U.S.C. section 360bbb-3(b)(1), unless the authorization is terminated or revoked sooner. Performed at [facility], [address] [place], [place] [address]   Blood Culture (routine x 2)     Status: None (Preliminary result)   Collection Time: 03/07/19  3:08 AM   Specimen: Vein; Blood  Result Value Ref Range Status   Specimen Description BLOOD LEFT ANTECUBITAL  Final   Special Requests   Final    BOTTLES DRAWN AEROBIC AND ANAEROBIC Blood Culture adequate volume   Culture   Final    NO GROWTH < 12 HOURS Performed at [facility], [address] [place], [place] [address]    Report Status PENDING  Incomplete  Blood Culture (routine x 2)     Status: None (Preliminary result)   Collection Time: 03/07/19  3:11 AM   Specimen: Vein; Blood  Result Value Ref Range Status   Specimen Description BLOOD RIGHT ANTECUBITAL  Final   Special Requests   Final    BOTTLES DRAWN AEROBIC AND ANAEROBIC Blood Culture adequate volume   Culture   Final    NO GROWTH < 12 HOURS Performed at [facility], [address] [place], [place] [address]    Report Status PENDING  Incomplete     Scheduled Meds: . ketorolac  30 mg Intravenous Once   Continuous Infusions: . sodium  chloride 75 mL/hr at 03/07/19 0451  . [START ON 03/08/2019] azithromycin    . [START ON 03/08/2019] cefTRIAXone (ROCEPHIN)  IV      Procedures/Studies: Dg Chest Portable 1 View  Result Date: 03/07/2019 CLINICAL DATA:  Chest pain, shortness of breath EXAM: PORTABLE CHEST 1 VIEW COMPARISON:  02/12/2019 FINDINGS: Low lung volumes. Heart is borderline in size. Again noted are bilateral pulmonary infiltrates. Some improvement in lung bases since prior study. No effusions. IMPRESSION: Low lung volumes. Bilateral pulmonary infiltrates with improvement in the lung bases since prior study. Electronically Signed   By: Rolm Baptise M.D.   On: 03/07/2019 00:05   Dg Chest Port 1 View  Result Date: 02/13/2019 CLINICAL DATA:  Pneumonia due to COVID-19 EXAM: PORTABLE CHEST 1 VIEW COMPARISON:  02/12/2019 FINDINGS: Very low lung volumes. Patchy bilateral airspace disease is similar to prior study. Heart is normal size. No effusions or acute bony abnormality.  IMPRESSION: Very low lung volumes. Continued patchy bilateral airspace disease, unchanged. Electronically Signed   By: Rolm Baptise M.D.   On: 02/13/2019 07:58   Dg Chest Port 1 View  Result Date: 02/12/2019 CLINICAL DATA:  COVID-19 pneumonia. EXAM: PORTABLE CHEST 1 VIEW COMPARISON:  02/11/2019 FINDINGS: The heart size and mediastinal contours are within normal limits. Similar appearance of bilateral pulmonary infiltrates in a peripheral and lower zone predominance with potentially slight worsening at the left lung base. No visualized pleural effusions or pneumothorax. The visualized skeletal structures are unremarkable. IMPRESSION: Similar bilateral pulmonary infiltrates with potential slight worsening at the left lung base. Electronically Signed   By: Aletta Edouard M.D.   On: 02/12/2019 07:51   Dg Chest Port 1 View  Result Date: 02/11/2019 CLINICAL DATA:  Shortness of breath.  COVID-19 positive. EXAM: PORTABLE CHEST 1 VIEW COMPARISON:  Radiograph 02/03/2015  FINDINGS: Low volumes assessment. Patchy bilateral peripheral predominant lung opacities. Grossly normal heart size for technique. No large pleural effusion or pneumothorax. IMPRESSION: Very low lung volumes with patchy bilateral airspace opacities consistent with COVID-19 pneumonia. Electronically Signed   By: Keith Rake M.D.   On: 02/11/2019 03:51    Orson Eva, DO  [facility] Pager [phone]  If 7PM-7AM, please contact night-coverage [email or web address] Password [email or web address] 03/07/2019, 8:23 AM   LOS: 0 days

## 2019-03-07 NOTE — ED Notes (Signed)
CRITICAL VALUE ALERT  Critical Value:  COVID POSITIVE   Date & Time Notied:  03/07/2019 0157  Provider Notified: Rancour, EDP  Orders Received/Actions taken: No orders at this time

## 2019-03-07 NOTE — ED Notes (Signed)
CRITICAL VALUE ALERT  Critical Value:  Lactic acid  Date & Time Notied:  03/07/2019 0341  Provider Notified: Dr Wyvonnia Dusky  Orders Received/Actions taken: see chart

## 2019-03-07 NOTE — ED Notes (Signed)
[facility] in to transfer 

## 2019-03-07 NOTE — H&P (Signed)
TRH H&P    Patient Demographics:    Paula King, is a 38 y.o. female  MRN: [id number]  DOB - 1980-12-12  Admit Date - 03/06/2019  Referring MD/NP/PA: Dr. Wyvonnia Dusky  Outpatient Primary MD for the patient is [facility], Modena Nunnery, MD  Patient coming from: Home  Chief complaint-shortness of breath   HPI:    Paula King  is a 38 y.o. female, with history of hypertension, hypothyroidism, and recent hospitalization for COVID-19.  Patient was recently hospitalized for COVID-19 from August 16 August 24 at [facility].  She was sent home without home oxygen.  She has a pulse ox at home is been monitoring her home oxygen and has been in the 90s up until today.  Today she noticed that her oxygen was dropping into the 80s.  She used her rescue inhaler several times and it did not help.  She then started to have chest pain and pressure.  When she came into the ED she was saturating on room air at 85% she improved to 95% on 2 L nasal cannula chest x-ray was done that shows atelectasis.  D-dimer was normal Trope normal.  Patient was started on Zithromax and ceftriaxone in the ER.  She was given Zofran for nausea.  She reports that the chest pressure is intermittent.  She reports that it is unpredictable.  EKG and troponin were done in the ED that were stable.  COVID test was done which is again positive.  Patient is being admitted to [facility] for supportive care with COVID-19.    Review of systems:    In addition to the HPI above,  No Fever-chills, Endorses headache, No changes with Vision or hearing, No problems swallowing food or Liquids, Endorses chest pain, dry cough and shortness of Breath, No Abdominal pain, No Nausea or Vomiting, bowel movements are chronically loose, No Blood in stool or Urine, No dysuria, No new skin rashes or bruises, No new joints pains-aches,  No new weakness, tingling, numbness in any  extremity, No recent weight gain or loss, No polyuria, polydypsia or polyphagia, No significant Mental Stressors.  All other systems reviewed and are negative.    Past History of the following :    Past Medical History:  Diagnosis Date  . 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 Diverticulitis   . Hypertension   . Preeclampsia       Past Surgical History:  Procedure Laterality Date  . CESAREAN SECTION    . CHOLECYSTECTOMY    . WISDOM TOOTH EXTRACTION        Social History:      Social History   Tobacco Use  . Smoking status: Never Smoker  . Smokeless tobacco: Never Used  Substance Use Topics  . Alcohol use: No       Family History :     Family History  Problem Relation Age of Onset  . Hypertension Father   . Diabetes Father   . Anxiety disorder Mother   . Depression Mother   . Cancer Sister  throat   . Hypertension Paternal Grandmother   . Hypertension Maternal Grandmother   . Asthma Daughter       Home Medications:   Prior to Admission medications   Medication Sig Start Date End Date Taking? Authorizing Provider  albuterol (VENTOLIN HFA) 108 (90 Base) MCG/ACT inhaler Inhale 2 puffs into the lungs every 4 (four) hours as needed for wheezing or shortness of breath. 02/21/19   Alycia Rossetti, MD  ARMOUR THYROID 30 MG tablet  01/08/19   [provider]  cetirizine (ZYRTEC) 10 MG tablet Take 10 mg by mouth daily.     [provider]  cholecalciferol (VITAMIN D) 1000 units tablet Take 1,000 Units by mouth daily.  12/26/17   [provider]  Cyanocobalamin (B-12) 1000 MCG TABS Take 1 tablet by mouth daily.  12/26/17   [provider]  dexamethasone (DECADRON) 2 MG tablet Take 6mg  x 2 days, then 4mg  x 2 days, then 2 mg x 2 days, 02/21/19   Alycia Rossetti, MD  ondansetron (ZOFRAN) 4 MG tablet Take 1 tablet (4 mg total) by mouth every 8 (eight) hours as needed for nausea or vomiting. 02/09/19   Hassell Done, Mary-Margaret, FNP   phentermine (ADIPEX-P) 37.5 MG tablet Take 37.5 mg by mouth every morning. 12/16/18   [provider]     Allergies:     Allergies  Allergen Reactions  . Ciprofloxacin     IV Only-Patient started sweating and becoming clammy when was infused with IV cipro on 08/09/14. Can tolerate PO Ciprofloxacin      Physical Exam:   Vitals  Blood pressure (!) 131/94, pulse (!) 102, temperature 99 F (37.2 C), resp. rate (!) 29, height 5\' 4"  (1.626 m), weight 115.2 kg, SpO2 100 %.  1.  General: Lying supine in bed in no acute distress with 2 L nasal cannula on  2. Psychiatric: Alert and oriented x3 pleasant and cooperative with exam  3. Neurologic: Cranial nerves II through XII are grossly intact, no asymmetrical weakness in any of the extremities  4. HEENMT:  Head is atraumatic normocephalic pupils are reactive to light trachea is midline  5. Respiratory : Lungs are clear to auscultation bilaterally  6. Cardiovascular : Heart rate is tachycardic rhythm is regular no murmur  7. Gastrointestinal:  Abdomen is obese, soft, nondistended, nontender  8. Skin:  No skin changes on limited skin exam  9.Musculoskeletal:  No peripheral edema    Data Review:    CBC Recent Labs  Lab 03/06/19 2132  WBC 6.9  HGB 15.7*  HCT 48.3*  PLT 186  MCV 94.5  MCH 30.7  MCHC 32.5  RDW 13.6  LYMPHSABS 1.6  MONOABS 0.6  EOSABS 0.1  BASOSABS 0.0   ------------------------------------------------------------------------------------------------------------------  Results for orders placed or performed during the hospital encounter of 03/06/19 (from the past 48 hour(s))  CBC with Differential     Status: Abnormal   Collection Time: 03/06/19  9:32 PM  Result Value Ref Range   WBC 6.9 4.0 - 10.5 K/uL   RBC 5.11 3.87 - 5.11 MIL/uL   Hemoglobin 15.7 (H) 12.0 - 15.0 g/dL   HCT 48.3 (H) 36.0 - 46.0 %   MCV 94.5 80.0 - 100.0 fL   MCH 30.7 26.0 - 34.0 pg   MCHC 32.5 30.0 - 36.0 g/dL    RDW 13.6 11.5 - 15.5 %   Platelets 186 150 - 400 K/uL   nRBC 0.0 0.0 - 0.2 %   Neutrophils  Relative % 64 %   Neutro Abs 4.5 1.7 - 7.7 K/uL   Lymphocytes Relative 23 %   Lymphs Abs 1.6 0.7 - 4.0 K/uL   Monocytes Relative 9 %   Monocytes Absolute 0.6 0.1 - 1.0 K/uL   Eosinophils Relative 2 %   Eosinophils Absolute 0.1 0.0 - 0.5 K/uL   Basophils Relative 1 %   Basophils Absolute 0.0 0.0 - 0.1 K/uL   Immature Granulocytes 1 %   Abs Immature Granulocytes 0.04 0.00 - 0.07 K/uL    Comment: Performed at [facility], [address], [place], [place] [id number]  Basic metabolic panel     Status: Abnormal   Collection Time: 03/06/19  9:32 PM  Result Value Ref Range   Sodium 141 135 - 145 mmol/L   Potassium 3.8 3.5 - 5.1 mmol/L   Chloride 106 98 - 111 mmol/L   CO2 25 22 - 32 mmol/L   Glucose, Bld 123 (H) 70 - 99 mg/dL   BUN 10 6 - 20 mg/dL   Creatinine, Ser 0.96 0.44 - 1.00 mg/dL   Calcium 10.5 (H) 8.9 - 10.3 mg/dL   GFR calc non Af Amer >60 >60 mL/min   GFR calc Af Amer >60 >60 mL/min   Anion gap 10 5 - 15    Comment: Performed at [facility], [address] [place], [place] [address]  Troponin I (High Sensitivity)     Status: None   Collection Time: 03/06/19  9:32 PM  Result Value Ref Range   Troponin I (High Sensitivity) <2.0 <18 ng/L    Comment: Performed at [facility], [address], [place], [place] [address]  D-dimer, quantitative (not at [facility])     Status: None   Collection Time: 03/06/19  9:32 PM  Result Value Ref Range   D-Dimer, Quant 0.45 0.00 - 0.50 ug/mL-FEU    Comment: (NOTE) At the manufacturer cut-off of 0.50 ug/mL FEU, this assay has been documented to exclude PE with a sensitivity and negative predictive value of 97 to 99%.  At this time, this assay has not been approved by the FDA to exclude DVT/VTE. Results should be correlated with clinical presentation. Performed at [facility], [address] [place], [place] [address]   Lactic acid, plasma     Status:  Abnormal   Collection Time: 03/06/19  9:32 PM  Result Value Ref Range   Lactic Acid, Venous 2.4 (HH) 0.5 - 1.9 mmol/L    Comment: CRITICAL RESULT CALLED TO, READ BACK BY AND VERIFIED WITH: Lynnea Ferrier @[id number]  03/07/19 [facility] Performed at [facility], [address], [place],  [address]   Procalcitonin     Status: None   Collection Time: 03/06/19  9:32 PM  Result Value Ref Range   Procalcitonin <0.10 ng/mL    Comment:        Interpretation: PCT (Procalcitonin) <= 0.5 ng/mL: Systemic infection (sepsis) is not likely. Local bacterial infection is possible. (NOTE)       Sepsis PCT Algorithm           Lower Respiratory Tract                                      Infection PCT Algorithm    ----------------------------     ----------------------------         PCT < 0.25 ng/mL  PCT < 0.10 ng/mL         Strongly encourage             Strongly discourage   discontinuation of antibiotics    initiation of antibiotics    ----------------------------     -----------------------------       PCT 0.25 - 0.50 ng/mL            PCT 0.10 - 0.25 ng/mL               OR       >80% decrease in PCT            Discourage initiation of                                            antibiotics      Encourage discontinuation           of antibiotics    ----------------------------     -----------------------------         PCT >= 0.50 ng/mL              PCT 0.26 - 0.50 ng/mL               AND        <80% decrease in PCT             Encourage initiation of                                             antibiotics       Encourage continuation           of antibiotics    ----------------------------     -----------------------------        PCT >= 0.50 ng/mL                  PCT > 0.50 ng/mL               AND         increase in PCT                  Strongly encourage                                      initiation of antibiotics    Strongly encourage escalation           of antibiotics                                      -----------------------------                                           PCT <= 0.25 ng/mL                                                 OR                                        >  80% decrease in PCT                                     Discontinue / Do not initiate                                             antibiotics Performed at [facility], [address] [place], [place] [address]   Lactate dehydrogenase     Status: Abnormal   Collection Time: 03/06/19  9:32 PM  Result Value Ref Range   LDH 219 (H) 98 - 192 U/L    Comment: Performed at [facility], [address], [place], [place] [address]  Ferritin     Status: None   Collection Time: 03/06/19  9:32 PM  Result Value Ref Range   Ferritin 158 11 - 307 ng/mL    Comment: Performed at [facility], [address], [place], [place] [address]  Triglycerides     Status: None   Collection Time: 03/06/19  9:32 PM  Result Value Ref Range   Triglycerides 134 <150 mg/dL    Comment: Performed at [facility], [address], [place], [place] [address]  Fibrinogen     Status: None   Collection Time: 03/06/19  9:32 PM  Result Value Ref Range   Fibrinogen 272 210 - 475 mg/dL    Comment: Performed at [facility], [address], [place], [place] [address]  Troponin I (High Sensitivity)     Status: None   Collection Time: 03/06/19 11:32 PM  Result Value Ref Range   Troponin I (High Sensitivity) <2.0 <18 ng/L    Comment: Performed at [facility], [address], [place], [place] [address]  SARS Coronavirus 2 [facility] order, Performed in [facility] hospital lab) Nasopharyngeal Nasopharyngeal Swab     Status: Abnormal   Collection Time: 03/07/19 12:42 AM   Specimen: Nasopharyngeal Swab  Result Value Ref Range   SARS Coronavirus 2 POSITIVE (A) NEGATIVE    Comment: RESULT CALLED TO, READ BACK BY AND VERIFIED WITH: H LONG,RN @0149  03/07/19 M[facility] (NOTE) If result is NEGATIVE SARS-CoV-2 target nucleic acids are  NOT DETECTED. The SARS-CoV-2 RNA is generally detectable in upper and lower  respiratory specimens during the acute phase of infection. The lowest  concentration of SARS-CoV-2 viral copies this assay can detect is 250  copies / mL. A negative result does not preclude SARS-CoV-2 infection  and should not be used as the sole basis for treatment or other  patient management decisions.  A negative result may occur with  improper specimen collection / handling, submission of specimen other  than nasopharyngeal swab, presence of viral mutation(s) within the  areas targeted by this assay, and inadequate number of viral copies  (<250 copies / mL). A negative result must be combined with clinical  observations, patient history, and epidemiological information. If result is POSITIVE SARS-CoV-2 target nucleic acids are DETECTED. The S ARS-CoV-2 RNA is generally detectable in upper and lower  respiratory specimens during the acute phase of infection.  Positive  results are indicative of active infection with SARS-CoV-2.  Clinical  correlation with patient history and other diagnostic information is  necessary to determine patient infection status.  Positive results do  not rule out bacterial infection or co-infection with other viruses. If  result is PRESUMPTIVE POSTIVE SARS-CoV-2 nucleic acids MAY BE PRESENT.   A presumptive positive result was obtained on the submitted specimen  and confirmed on repeat testing.  While 2019 novel coronavirus  (SARS-CoV-2) nucleic acids may be present in the submitted sample  additional confirmatory testing may be necessary for epidemiological  and / or clinical management purposes  to differentiate between  SARS-CoV-2 and other Sarbecovirus currently known to infect humans.  If clinically indicated additional testing with an alternate test  methodology [phone]) is adv ised. The SARS-CoV-2 RNA is generally  detectable in upper and lower respiratory specimens  during the acute  phase of infection. The expected result is Negative. Fact Sheet for Patients:  StrictlyIdeas.no Fact Sheet for Healthcare Providers: BankingDealers.co.za This test is not yet approved or cleared by the [place] FDA and has been authorized for detection and/or diagnosis of SARS-CoV-2 by FDA under an Emergency Use Authorization (EUA).  This EUA will remain in effect (meaning this test can be used) for the duration of the COVID-19 declaration under Section 564(b)(1) of the Act, 21 U.S.C. section 360bbb-3(b)(1), unless the authorization is terminated or revoked sooner. Performed at [facility], [address] [place], [place] [address]   Lactic acid, plasma     Status: None   Collection Time: 03/07/19  3:08 AM  Result Value Ref Range   Lactic Acid, Venous 1.5 0.5 - 1.9 mmol/L    Comment: Performed at [facility], [address], [place], [place] [address]    Chemistries  Recent Labs  Lab 03/06/19 2132  NA 141  K 3.8  CL 106  CO2 25  GLUCOSE 123*  BUN 10  CREATININE 0.96  CALCIUM 10.5*   ------------------------------------------------------------------------------------------------------------------  ------------------------------------------------------------------------------------------------------------------ GFR: Estimated Creatinine Clearance: 99 mL/min (by C-G formula based on SCr of 0.96 mg/dL). Liver Function Tests: No results for input(s): AST, ALT, ALKPHOS, BILITOT, PROT, ALBUMIN in the last 168 hours. No results for input(s): LIPASE, AMYLASE in the last 168 hours. No results for input(s): AMMONIA in the last 168 hours. Coagulation Profile: No results for input(s): INR, PROTIME in the last 168 hours. Cardiac Enzymes: No results for input(s): CKTOTAL, CKMB, CKMBINDEX, TROPONINI in the last 168 hours. BNP (last 3 results) No results for input(s): PROBNP in the last 8760 hours. HbA1C: No  results for input(s): HGBA1C in the last 72 hours. CBG: No results for input(s): GLUCAP in the last 168 hours. Lipid Profile: Recent Labs    03/06/19 2132  TRIG 134   Thyroid Function Tests: No results for input(s): TSH, T4TOTAL, FREET4, T3FREE, THYROIDAB in the last 72 hours. Anemia Panel: Recent Labs    03/06/19 2132  FERRITIN 158    --------------------------------------------------------------------------------------------------------------- Urine analysis:    Component Value Date/Time   COLORURINE YELLOW 08/09/2014 1405   APPEARANCEUR CLOUDY (A) 08/09/2014 1405   LABSPEC 1.025 04/18/2017 0939   PHURINE 5.0 04/18/2017 0939   GLUCOSEU NEGATIVE 04/18/2017 0939   HGBUR NEGATIVE 04/18/2017 0939   BILIRUBINUR Negative 08/21/2017 [place] 04/18/2017 0939   PROTEINUR Negative 08/21/2017 1227   PROTEINUR NEGATIVE 04/18/2017 0939   UROBILINOGEN 0.2 08/21/2017 1227   UROBILINOGEN 0.2 04/18/2017 0939   NITRITE Negative 08/21/2017 1227   NITRITE NEGATIVE 04/18/2017 0939   LEUKOCYTESUR Negative 08/21/2017 1227      Imaging Results:    Dg Chest Portable 1 View  Result Date: 03/07/2019 CLINICAL DATA:  Chest pain, shortness of breath EXAM: PORTABLE CHEST 1 VIEW COMPARISON:  02/12/2019 FINDINGS: Low lung  volumes. Heart is borderline in size. Again noted are bilateral pulmonary infiltrates. Some improvement in lung bases since prior study. No effusions. IMPRESSION: Low lung volumes. Bilateral pulmonary infiltrates with improvement in the lung bases since prior study. Electronically Signed   By: Rolm Baptise M.D.   On: 03/07/2019 00:05    My personal review of EKG: Rhythm sinus tachycardia, Rate 102 /min, QTc422,no Acute ST changes   Assessment & Plan:    Active Problems:   Coronavirus infection   1. Coronavirus infection 1. Admit to [facility] 2. Continue supportive care 3. Continue oxygen supplementation 2. Abnormal chest x-ray 1. Atelectasis versus  infection 2. Ceftriaxone and Zithromax started in the ED 3. Continue ceftriaxone and Zithromax 3. Lactic acidosis 1. Improved with fluid hydration   DVT Prophylaxis-   Lovenox - SCDs   AM Labs Ordered, also please review Full Orders  Family Communication: Admission, patients condition and plan of care including tests being ordered have been discussed with the patient . Code Status:  FULL Admission status: Inpatient: Based on patients clinical presentation and evaluation of above clinical data, I have made determination that patient meets Inpatient criteria at this time.  Time spent in minutes : 65   Rolla Plate M.D on 03/07/2019 at 4:23 AM

## 2019-03-07 NOTE — Progress Notes (Signed)
  Echocardiogram 2D Echocardiogram limited has been performed.  Darlina Sicilian M 03/07/2019, 3:09 PM

## 2019-03-07 NOTE — ED Notes (Addendum)
Pt reports left sided chest pain that has increased since earlier.Pt very anxious Pt HR noted to get as high as 150 then decreasing back into the 120's. Paged hospitalist EDP reviewed EKG

## 2019-03-08 DIAGNOSIS — I2699 Other pulmonary embolism without acute cor pulmonale: Secondary | ICD-10-CM

## 2019-03-08 DIAGNOSIS — U071 COVID-19: Principal | ICD-10-CM

## 2019-03-08 LAB — CBC WITH DIFFERENTIAL/PLATELET
Abs Immature Granulocytes: 0.12 10*3/uL — ABNORMAL HIGH (ref 0.00–0.07)
Basophils Absolute: 0 10*3/uL (ref 0.0–0.1)
Basophils Relative: 0 %
Eosinophils Absolute: 0.1 10*3/uL (ref 0.0–0.5)
Eosinophils Relative: 1 %
HCT: 41.5 % (ref 36.0–46.0)
Hemoglobin: 13.3 g/dL (ref 12.0–15.0)
Immature Granulocytes: 1 %
Lymphocytes Relative: 21 %
Lymphs Abs: 2.8 10*3/uL (ref 0.7–4.0)
MCH: 30.6 pg (ref 26.0–34.0)
MCHC: 32 g/dL (ref 30.0–36.0)
MCV: 95.6 fL (ref 80.0–100.0)
Monocytes Absolute: 1.4 10*3/uL — ABNORMAL HIGH (ref 0.1–1.0)
Monocytes Relative: 10 %
Neutro Abs: 9.3 10*3/uL — ABNORMAL HIGH (ref 1.7–7.7)
Neutrophils Relative %: 67 %
Platelets: 165 10*3/uL (ref 150–400)
RBC: 4.34 MIL/uL (ref 3.87–5.11)
RDW: 14.3 % (ref 11.5–15.5)
WBC: 13.8 10*3/uL — ABNORMAL HIGH (ref 4.0–10.5)
nRBC: 0 % (ref 0.0–0.2)

## 2019-03-08 LAB — MISC LABCORP TEST (SEND OUT): Labcorp test code: 164068

## 2019-03-08 LAB — COMPREHENSIVE METABOLIC PANEL
ALT: 55 U/L — ABNORMAL HIGH (ref 0–44)
AST: 37 U/L (ref 15–41)
Albumin: 3.6 g/dL (ref 3.5–5.0)
Alkaline Phosphatase: 45 U/L (ref 38–126)
Anion gap: 13 (ref 5–15)
BUN: 19 mg/dL (ref 6–20)
CO2: 19 mmol/L — ABNORMAL LOW (ref 22–32)
Calcium: 8.1 mg/dL — ABNORMAL LOW (ref 8.9–10.3)
Chloride: 105 mmol/L (ref 98–111)
Creatinine, Ser: 1.38 mg/dL — ABNORMAL HIGH (ref 0.44–1.00)
GFR calc Af Amer: 56 mL/min — ABNORMAL LOW (ref >60)
GFR calc non Af Amer: 48 mL/min — ABNORMAL LOW (ref >60)
Glucose, Bld: 103 mg/dL — ABNORMAL HIGH (ref 70–99)
Potassium: 4.6 mmol/L (ref 3.5–5.1)
Sodium: 137 mmol/L (ref 135–145)
Total Bilirubin: 1 mg/dL (ref 0.3–1.2)
Total Protein: 6.3 g/dL — ABNORMAL LOW (ref 6.5–8.1)

## 2019-03-08 LAB — PROCALCITONIN: Procalcitonin: <0.1 ng/mL

## 2019-03-08 LAB — MAGNESIUM: Magnesium: 2.2 mg/dL (ref 1.7–2.4)

## 2019-03-08 LAB — PHOSPHORUS: Phosphorus: 4.9 mg/dL — ABNORMAL HIGH (ref 2.5–4.6)

## 2019-03-08 MED ORDER — RIVAROXABAN 15 MG PO TABS
15.0000 mg | ORAL_TABLET | Freq: Two times a day (BID) | ORAL | Status: DC
  Filled 2019-03-08 (×3): qty 1

## 2019-03-08 MED ORDER — SODIUM CHLORIDE 0.9 % IV SOLN
INTRAVENOUS | Status: AC
  Administered 2019-03-08: 13:00:00 via INTRAVENOUS

## 2019-03-08 NOTE — TOC Progression Note (Signed)
Transition of Care [facility]) - Progression Note    Patient Details  Name: Paula King MRN: [id number] Date of Birth: [birth year]  Transition of Care [facility]) CM/SW Contact  Loletha Grayer Beverely Pace, RN Phone Number: [phone] (working remotely) 03/08/2019, 4:18 PM  Clinical Narrative:   Case manager is contacting [place] for assistance in locating a company that can accept patient's Insurance coverage for oxygen. It appears that patient's coverage is very limited. Will continue to work on this.        Expected Discharge Plan and Services           Expected Discharge Date: 03/09/19                                     Social Determinants of Health (SDOH) Interventions    Readmission Risk Interventions No flowsheet data found.

## 2019-03-08 NOTE — Progress Notes (Signed)
PROGRESS NOTE    Paula King  [id number] DOB: 02/05/1981 DOA: 03/06/2019 PCP: Alycia Rossetti, MD      Brief Narrative:  [name] is a 38 y.o. F with obesity, Cushing's syndrome,    Assessment & Plan:  Coronavirus pneumonitis with acute hypoxic respiratory failure In setting of ongoing 2020 COVID-19 pandemic. Resolving at time of discharge.  Abs positive, resolved infection, no active infection at this time. Was discharged with O2, couldn't obtain for insurance reasons.  No role for remdesivir or steroids at this point. -CM consult for O2   Acute pulmonary embolism CTA yesteday obtained and showed segmental PE.  Hcg negative.  Echo showed normal RV function.  PESI low risk 78, class II.  Procal negative, suspect leukocytes are stress reaction to PE. -Hold antibiotics and monitor fever curve, procal -Continue supplemental O2 -Continue Lovenox until tomorrow morning -Start Xarelto tomorrow night -CM consult for Xarelto card   Possible Cushing's syndrome Adrenal incidentaloma in 2016, RIGHT adrenal.  Repeat CT 2018 no growth, HU density favors adenoma.  Subsequently metanephrines normal but first 24-hour urine free cortisol elevated.    Dexamethasone suppression test however suggested this was NOT ACTH dependent, and repeat 24-urine cortisol was normal.  Currently, Endo (Dr. Dorris Fetch) is following this clinically, and would like to repeat her dex-suppression test.  I do not think this is pertinent to her current admission, other than theoretically Cushing's is a pro-coagulable state.   Sinus tachycardia Long-standing problem. Basleine 100s. Treated at home intermittently with atenolol. HR normalized overnight  AKI Creatinine baseline 0.9-1.0 mg/dL, today up to 1.38.  BNP normal.  Lactate normalized.  Procal negative. -Gentle IV fluids and monitor Cr       MDM and disposition Plan:  The below labs and imaging reports reviewed and summarized above.   Medication management as above.  The patient was admitted with acute PE.  We will transition her Lovenox to Xarelto tomorrow.  She is had no right heart strain, and overall this is a low risk PE, likely home tomorrow.      DVT prophylaxis: Lovenox Code Status: FULL Family Communication: Father     Antimicrobials:   Ceftriaxone x1 03/07/2019  Azithromycin x1 9/9    Culture data:   9/9 blood culture x2 NGTD  COVID repeat positive 9/9, cycle threshold 31 for E probe, 34.7 for N2 probe       Subjective: No chest pain, no dyspnea, no confusion, no sputum, no hemoptysis, no syncope.        Objective: Vitals:   03/08/19 0400 03/08/19 0800 03/08/19 1201 03/08/19 1534  BP: 112/63 118/86 105/68 126/68  Pulse: 75 77    Resp: 19 19    Temp: 98.4 F (36.9 C)  98 F (36.7 C) 98.5 F (36.9 C)  TempSrc: Oral  Oral Oral  SpO2: 90% 92%    Weight:      Height:        Intake/Output Summary (Last 24 hours) at 03/08/2019 1705 Last data filed at 03/07/2019 1844 Gross per 24 hour  Intake 240 ml  Output --  Net 240 ml   Filed Weights   03/06/19 2116  Weight: 115.2 kg    Examination: General appearance: obese adult female, lying in bed, attentive   HEENT: Anicteric, conjunctival pink, lids and lashes normal.  No nasal deformity, discharge, or epistaxis.  Lips moist.   Skin: Dry, no suspicious rashes or lesions. Cardiac: Regular rate and rhythm, no murmurs, JVP  not visible due to body habitus, no lower extremity edema Respiratory: Normal respiratory rate and rhythm, lungs clear without rales or wheezes Abdomen: Abdomen soft, no tenderness palpation, no ascites, distention, or hepatosplenomegaly. MSK: No deformities or effusions large joints of the upper lower extremities bilaterally Neuro: Awake and alert, extraocular movements intact, moves all extremities, speech fluent.    Psych: Sensorium intact responding to questions, attention normal, affect normal, judgment insight  appear normal.      Data Reviewed: I have personally reviewed following labs and imaging studies:  CBC: Recent Labs  Lab 03/06/19 2132 03/07/19 1122 03/08/19 0210  WBC 6.9 9.1 13.8*  NEUTROABS 4.5  --  9.3*  HGB 15.7* 15.5* 13.3  HCT 48.3* 45.8 41.5  MCV 94.5 93.7 95.6  PLT 186 187 [id number]   Basic Metabolic Panel: Recent Labs  Lab 03/06/19 2132 03/07/19 1122 03/08/19 0210  NA 141  --  137  K 3.8  --  4.6  CL 106  --  105  CO2 25  --  19*  GLUCOSE 123*  --  103*  BUN 10  --  19  CREATININE 0.96 1.06* 1.38*  CALCIUM 10.5*  --  8.1*  MG  --   --  2.2  PHOS  --   --  4.9*   GFR: Estimated Creatinine Clearance: 68.8 mL/min (A) (by C-G formula based on SCr of 1.38 mg/dL (H)). Liver Function Tests: Recent Labs  Lab 03/08/19 0210  AST 37  ALT 55*  ALKPHOS 45  BILITOT 1.0  PROT 6.3*  ALBUMIN 3.6   No results for input(s): LIPASE, AMYLASE in the last 168 hours. No results for input(s): AMMONIA in the last 168 hours. Coagulation Profile: Recent Labs  Lab 03/07/19 1700  INR 1.1   Cardiac Enzymes: No results for input(s): CKTOTAL, CKMB, CKMBINDEX, TROPONINI in the last 168 hours. BNP (last 3 results) No results for input(s): PROBNP in the last 8760 hours. HbA1C: No results for input(s): HGBA1C in the last 72 hours. CBG: No results for input(s): GLUCAP in the last 168 hours. Lipid Profile: Recent Labs    03/06/19 2132  TRIG 134   Thyroid Function Tests: No results for input(s): TSH, T4TOTAL, FREET4, T3FREE, THYROIDAB in the last 72 hours. Anemia Panel: Recent Labs    03/06/19 2132  FERRITIN 158   Urine analysis:    Component Value Date/Time   COLORURINE YELLOW 08/09/2014 1405   APPEARANCEUR CLOUDY (A) 08/09/2014 1405   LABSPEC 1.025 04/18/2017 0939   PHURINE 5.0 04/18/2017 0939   GLUCOSEU NEGATIVE 04/18/2017 0939   HGBUR NEGATIVE 04/18/2017 0939   BILIRUBINUR Negative 08/21/2017 [place] 04/18/2017 0939   PROTEINUR Negative  08/21/2017 1227   PROTEINUR NEGATIVE 04/18/2017 0939   UROBILINOGEN 0.2 08/21/2017 1227   UROBILINOGEN 0.2 04/18/2017 0939   NITRITE Negative 08/21/2017 1227   NITRITE NEGATIVE 04/18/2017 0939   LEUKOCYTESUR Negative 08/21/2017 1227   Sepsis Labs: @LABRCNTIP (procalcitonin:4,lacticacidven:4)  ) Recent Results (from the past 240 hour(s))  SARS Coronavirus 2 [facility] order, Performed in [facility] hospital lab) Nasopharyngeal Nasopharyngeal Swab     Status: Abnormal   Collection Time: 03/07/19 12:42 AM   Specimen: Nasopharyngeal Swab  Result Value Ref Range Status   SARS Coronavirus 2 POSITIVE (A) NEGATIVE Final    Comment: RESULT CALLED TO, READ BACK BY AND VERIFIED WITH: H LONG,RN @0149  03/07/19 MKELLY (NOTE) If result is NEGATIVE SARS-CoV-2 target nucleic acids are NOT DETECTED. The SARS-CoV-2 RNA is generally detectable in  upper and lower  respiratory specimens during the acute phase of infection. The lowest  concentration of SARS-CoV-2 viral copies this assay can detect is 250  copies / mL. A negative result does not preclude SARS-CoV-2 infection  and should not be used as the sole basis for treatment or other  patient management decisions.  A negative result may occur with  improper specimen collection / handling, submission of specimen other  than nasopharyngeal swab, presence of viral mutation(s) within the  areas targeted by this assay, and inadequate number of viral copies  (<250 copies / mL). A negative result must be combined with clinical  observations, patient history, and epidemiological information. If result is POSITIVE SARS-CoV-2 target nucleic acids are DETECTED. The S ARS-CoV-2 RNA is generally detectable in upper and lower  respiratory specimens during the acute phase of infection.  Positive  results are indicative of active infection with SARS-CoV-2.  Clinical  correlation with patient history and other diagnostic information is  necessary to determine  patient infection status.  Positive results do  not rule out bacterial infection or co-infection with other viruses. If result is PRESUMPTIVE POSTIVE SARS-CoV-2 nucleic acids MAY BE PRESENT.   A presumptive positive result was obtained on the submitted specimen  and confirmed on repeat testing.  While 2019 novel coronavirus  (SARS-CoV-2) nucleic acids may be present in the submitted sample  additional confirmatory testing may be necessary for epidemiological  and / or clinical management purposes  to differentiate between  SARS-CoV-2 and other Sarbecovirus currently known to infect humans.  If clinically indicated additional testing with an alternate test  methodology [phone]) is adv ised. The SARS-CoV-2 RNA is generally  detectable in upper and lower respiratory specimens during the acute  phase of infection. The expected result is Negative. Fact Sheet for Patients:  StrictlyIdeas.no Fact Sheet for Healthcare Providers: BankingDealers.co.za This test is not yet approved or cleared by the [place] FDA and has been authorized for detection and/or diagnosis of SARS-CoV-2 by FDA under an Emergency Use Authorization (EUA).  This EUA will remain in effect (meaning this test can be used) for the duration of the COVID-19 declaration under Section 564(b)(1) of the Act, 21 U.S.C. section 360bbb-3(b)(1), unless the authorization is terminated or revoked sooner. Performed at [facility], [address] [place], [place] [address]   Blood Culture (routine x 2)     Status: None (Preliminary result)   Collection Time: 03/07/19  3:08 AM   Specimen: BLOOD  Result Value Ref Range Status   Specimen Description BLOOD LEFT ANTECUBITAL  Final   Special Requests   Final    BOTTLES DRAWN AEROBIC AND ANAEROBIC Blood Culture adequate volume   Culture   Final    NO GROWTH 1 DAY Performed at [facility], [address], [place], [place] [address]     Report Status PENDING  Incomplete  Blood Culture (routine x 2)     Status: None (Preliminary result)   Collection Time: 03/07/19  3:11 AM   Specimen: BLOOD  Result Value Ref Range Status   Specimen Description BLOOD RIGHT ANTECUBITAL  Final   Special Requests   Final    BOTTLES DRAWN AEROBIC AND ANAEROBIC Blood Culture adequate volume   Culture   Final    NO GROWTH 1 DAY Performed at [facility], [address] [place], [place] [address]    Report Status PENDING  Incomplete         Radiology Studies: Ct Angio Chest Pe W  Or Wo Contrast  Addendum Date: 03/07/2019   ADDENDUM REPORT: 03/07/2019 16:47 ADDENDUM: Critical Value/emergent results were called by telephone at the time of interpretation on 03/07/2019 at 4:46 pm to providerDr. Myrene Buddy , who verbally acknowledged these results. Electronically Signed   By: Kerby Moors M.D.   On: 03/07/2019 16:47   Result Date: 03/07/2019 CLINICAL DATA:  Evaluate for PE.  Dyspnea. EXAM: CT ANGIOGRAPHY CHEST WITH CONTRAST TECHNIQUE: Multidetector CT imaging of the chest was performed using the standard protocol during bolus administration of intravenous contrast. Multiplanar CT image reconstructions and MIPs were obtained to evaluate the vascular anatomy. CONTRAST:  173mL OMNIPAQUE IOHEXOL 350 MG/ML SOLN COMPARISON:  None FINDINGS: Cardiovascular: There is posterior right lower lobe segmental and subsegmental pulmonary artery filling defect compatible with acute pulmonary embolus, image 79/5. Normal heart size.  No pericardial effusion. Mediastinum/Nodes: Normal appearance of the thyroid gland. Normal appearance of the thyroid gland. The trachea appears patent and is midline. No enlarged mediastinal or hilar lymph nodes. Lungs/Pleura: No pleural effusion. Diffuse bilateral interstitial opacities are identified within the upper and lower lung zones compatible with either viral pneumonia or inflammation. No lobar consolidation. Upper Abdomen:  Unchanged 2.9 cm right adrenal gland adenoma. Hepatic steatosis. No acute abnormality. Musculoskeletal: No chest wall abnormality. No acute or significant osseous findings. Review of the MIP images confirms the above findings. IMPRESSION: 1. Exam positive for segmental and subsegmental pulmonary artery filling defects to the posterior right lower lobe compatible with acute pulmonary embolus. 2. Diffuse bilateral upper and lower lung zone interstitial opacities compatible with atypical infection. 3. Hepatic steatosis 4. Unchanged right adrenal gland adenoma. Electronically Signed: By: Kerby Moors M.D. On: 03/07/2019 16:40   Dg Chest Portable 1 View  Result Date: 03/07/2019 CLINICAL DATA:  Chest pain, shortness of breath EXAM: PORTABLE CHEST 1 VIEW COMPARISON:  02/12/2019 FINDINGS: Low lung volumes. Heart is borderline in size. Again noted are bilateral pulmonary infiltrates. Some improvement in lung bases since prior study. No effusions. IMPRESSION: Low lung volumes. Bilateral pulmonary infiltrates with improvement in the lung bases since prior study. Electronically Signed   By: Rolm Baptise M.D.   On: 03/07/2019 00:05        Scheduled Meds:  cholecalciferol  1,000 Units Oral Daily   enoxaparin (LOVENOX) injection  120 mg Subcutaneous Q12H   [START ON 03/09/2019] Rivaroxaban  15 mg Oral BID WC   thyroid  30 mg Oral QAC breakfast   vitamin B-12  1,000 mcg Oral Daily   Continuous Infusions:    LOS: 1 day    Time spent: 35 minutes    Edwin Dada, MD [facility] 03/08/2019, 5:05 PM     Please page through [place]:  [email or web address] Password [email or web address] If 7PM-7AM, please contact night-coverage

## 2019-03-09 DIAGNOSIS — J9601 Acute respiratory failure with hypoxia: Secondary | ICD-10-CM

## 2019-03-09 LAB — COMPREHENSIVE METABOLIC PANEL
ALT: 49 U/L — ABNORMAL HIGH (ref 0–44)
AST: 26 U/L (ref 15–41)
Albumin: 3.5 g/dL (ref 3.5–5.0)
Alkaline Phosphatase: 38 U/L (ref 38–126)
Anion gap: 8 (ref 5–15)
BUN: 24 mg/dL — ABNORMAL HIGH (ref 6–20)
CO2: 24 mmol/L (ref 22–32)
Calcium: 8.3 mg/dL — ABNORMAL LOW (ref 8.9–10.3)
Chloride: 109 mmol/L (ref 98–111)
Creatinine, Ser: 1.38 mg/dL — ABNORMAL HIGH (ref 0.44–1.00)
GFR calc Af Amer: 56 mL/min — ABNORMAL LOW (ref >60)
GFR calc non Af Amer: 48 mL/min — ABNORMAL LOW (ref >60)
Glucose, Bld: 98 mg/dL (ref 70–99)
Potassium: 3.4 mmol/L — ABNORMAL LOW (ref 3.5–5.1)
Sodium: 141 mmol/L (ref 135–145)
Total Bilirubin: 0.6 mg/dL (ref 0.3–1.2)
Total Protein: 5.8 g/dL — ABNORMAL LOW (ref 6.5–8.1)

## 2019-03-09 LAB — CBC WITH DIFFERENTIAL/PLATELET
Abs Immature Granulocytes: 0.07 10*3/uL (ref 0.00–0.07)
Basophils Absolute: 0 10*3/uL (ref 0.0–0.1)
Basophils Relative: 1 %
Eosinophils Absolute: 0.2 10*3/uL (ref 0.0–0.5)
Eosinophils Relative: 4 %
HCT: 39.3 % (ref 36.0–46.0)
Hemoglobin: 12.9 g/dL (ref 12.0–15.0)
Immature Granulocytes: 1 %
Lymphocytes Relative: 41 %
Lymphs Abs: 2.9 10*3/uL (ref 0.7–4.0)
MCH: 31.2 pg (ref 26.0–34.0)
MCHC: 32.8 g/dL (ref 30.0–36.0)
MCV: 94.9 fL (ref 80.0–100.0)
Monocytes Absolute: 0.8 10*3/uL (ref 0.1–1.0)
Monocytes Relative: 12 %
Neutro Abs: 2.8 10*3/uL (ref 1.7–7.7)
Neutrophils Relative %: 41 %
Platelets: 139 10*3/uL — ABNORMAL LOW (ref 150–400)
RBC: 4.14 MIL/uL (ref 3.87–5.11)
RDW: 14.1 % (ref 11.5–15.5)
WBC: 6.8 10*3/uL (ref 4.0–10.5)
nRBC: 0 % (ref 0.0–0.2)

## 2019-03-09 LAB — MAGNESIUM: Magnesium: 2.1 mg/dL (ref 1.7–2.4)

## 2019-03-09 LAB — PHOSPHORUS: Phosphorus: 4.9 mg/dL — ABNORMAL HIGH (ref 2.5–4.6)

## 2019-03-09 LAB — PROCALCITONIN: Procalcitonin: <0.1 ng/mL

## 2019-03-09 MED ORDER — RIVAROXABAN 15 MG PO TABS: 15.0000 mg | ORAL_TABLET | Freq: Two times a day (BID) | ORAL | 0 refills | Status: DC

## 2019-03-09 MED ORDER — ONDANSETRON HCL 4 MG PO TABS: 4.0000 mg | ORAL_TABLET | Freq: Four times a day (QID) | ORAL | 0 refills | Status: DC | PRN

## 2019-03-09 MED ORDER — APIXABAN 5 MG PO TABS: 10.0000 mg | ORAL_TABLET | Freq: Two times a day (BID) | ORAL | 0 refills | Status: DC

## 2019-03-09 MED ORDER — RIVAROXABAN 20 MG PO TABS: 20.0000 mg | ORAL_TABLET | Freq: Every day | ORAL | 2 refills | Status: DC

## 2019-03-09 MED ORDER — APIXABAN 5 MG PO TABS: 5.0000 mg | ORAL_TABLET | Freq: Two times a day (BID) | ORAL | 0 refills | Status: DC

## 2019-03-09 MED FILL — ONDANSETRON HCL 4 MG TABLET: 4 | 5 days supply | Qty: 20 | Fill #0

## 2019-03-09 MED FILL — ELIQUIS STARTER PACK 5 MG T: 5 | 30 days supply | Qty: 74 | Fill #0

## 2019-03-09 NOTE — Discharge Summary (Signed)
Physician Discharge Summary  Paula King [id number] DOB: 07-19-1980 DOA: 03/06/2019  PCP: Alycia Rossetti, MD  Admit date: 03/06/2019 Discharge date: 03/09/2019  Admitted From: Home  Disposition:  Home   Recommendations for Outpatient Follow-up:  1. Follow up with PCP in 1 week 2. Please continue oral anticoagulation for 3 months then re-evaluate 3. Please repeat BMP in 1-2 weeks to monitor serum Cr 4. Please wean oxygen as able     Home Health: None  Equipment/Devices: None  Discharge Condition: Fair  CODE STATUS: FULL Diet recommendation: Regular  Brief/Interim Summary: Paula King is a 38 y.o. F with hypothyroidism, ADHD not currently on meds, possible Cushing's syndrome, and chronic sinus tachycardia who presented with chest discomfort and dyspnea.  Patient had recently been admitted for typical COVID.  Was treated with remdesivir, steroids, weaned down to 1-2L O2 and discharged with oxygen, which her insurance didn't cover.  She was nonetheless, rehabbing well at home until a few days before admission when she had worsening dyspnea, chest discomfort and tachycardia.  She presented to the ER, where she was admitted with a working diagnosis of "coronavirus infection"        [place] DIAGNOSIS: Acute pulmonary embolism    Discharge Diagnoses:   Acute segmental pulmonary embolism VTE is a known complication of COVID.  This patient presented with new chest discomfort, tachycardia, and worsening dyspnea, afebrile, and with unchanged CXR.  CTA chest was obtained that showed a posterior right lower lobe segmental and subsegmental pulmonary embolism.  She was started on Lovenox.  Follow up echo showed normal LV function.  She was given Lovenox 2 days then transitioned to Eliquis.  BMI is elevated, but weight is <120 kg.  This is a first time provoked PE.   -Recommend at least 3 months anticoagulation, then likely discontinuation.    Resolved  coronavirus infection The patient was diagnosed over a month ago.  She has evidence of SARS-CoV-2 antibodies and no new URI symptoms.  Her return presentation does not indicate recurrent infection, although her principal diagnosis was a complication of recent COVID.  Possible Cushing's syndrome Adrenal incidentaloma in 2016, RIGHT adrenal.  Repeat CT 2018 no growth, HU density favored adenoma.  Subsequently metanephrines normal but 24-hour urine free cortisol elevated.  Dexamethasone suppression test suggested this was NOT ACTH dependent, and repeat 24-urine cortisol was normal, however.  Currently, Endo (Dr. Dorris Fetch) is following this clinically given her inconsistent testing, and would like to repeat her dex-suppression test.  I do not think this is pertinent to her current admission, other than theoretically Cushing's is a pro-coagulable state. -Please encourage Endocrinology follow up  Sinus tachycardia Long-standing problem. Baseline 100s per prior Cardiology note.  Here resolved to normal.  AKI, possible Creatinine baseline 0.9-1.0 mg/dL, increased slightly from that to 1.38 mg/dL, stable even after fluids.  BNP normal.  Lactate normalized.  Procal negative. -Repeat BMP in 1-2 weeks         Discharge Instructions  Discharge Instructions    Discharge instructions   Complete by: As directed    From Dr. Loleta Books: You were admitted for a blood clot in the lungs (also known as a "pulmonary embolism" or "PE") You were started on a blood thinner here. You should continue this in pill form (Eliquis) for 3 months  Take apixaban/Eliquis 10 mg (2 tabs) twice daily starting tonight Take apixaban/Eliquis 10 mg (2 tabs) twice daily for **7** days, then starting the evening of Sep 18th Take apixaban/Eliquis 5  mg (1 tab) twice daily for 3 months After three months, stop unless directed otherwise by Dr. Buelah Manis  Monitor stools for blood or "black and tarry" stools  Resume all your  other home medicines  Follow up with Dr. Buelah Manis in 1 week   Increase activity slowly   Complete by: As directed      Allergies as of 03/09/2019      Reactions   Ciprofloxacin    IV Only-Patient started sweating and becoming clammy when was infused with IV cipro on 08/09/14. Can tolerate PO Ciprofloxacin       Medication List    TAKE these medications   albuterol 108 (90 Base) MCG/ACT inhaler Commonly known as: VENTOLIN HFA Inhale 2 puffs into the lungs every 4 (four) hours as needed for wheezing or shortness of breath.   apixaban 5 MG Tabs tablet Commonly known as: Eliquis Take 2 tablets (10 mg total) by mouth 2 (two) times daily for 7 days.   apixaban 5 MG Tabs tablet Commonly known as: Eliquis Take 1 tablet (5 mg total) by mouth 2 (two) times daily. Start taking on: March 16, 2019   B-12 1000 MCG Tabs Take 1 tablet by mouth daily.   cetirizine 10 MG tablet Commonly known as: ZYRTEC Take 10 mg by mouth daily.   cholecalciferol 1000 units tablet Commonly known as: VITAMIN D Take 1,000 Units by mouth daily.   ondansetron 4 MG tablet Commonly known as: Zofran Take 1 tablet (4 mg total) by mouth every 8 (eight) hours as needed for nausea or vomiting. What changed: Another medication with the same name was added. Make sure you understand how and when to take each.   ondansetron 4 MG tablet Commonly known as: ZOFRAN Take 1 tablet (4 mg total) by mouth every 6 (six) hours as needed for nausea. What changed: You were already taking a medication with the same name, and this prescription was added. Make sure you understand how and when to take each.            Durable Medical Equipment  (From admission, onward)         Start     Ordered   03/09/19 1029  DME Oxygen  Once    Question Answer Comment  Length of Need 6 Months   Mode or (Route) Nasal cannula   Liters per Minute 2   Frequency Continuous (stationary and portable oxygen unit needed)   Oxygen  delivery system Gas      03/09/19 1031         Follow-up Information    Boyd, Modena Nunnery, MD Follow up.   Specialty: Family Medicine Why: Call for an appointment in 1 week Contact information: [place] [address] [phone]        Herminio Commons, MD .   Specialty: Cardiology Contact information: [place] [address] [phone]          Allergies  Allergen Reactions  . Ciprofloxacin     IV Only-Patient started sweating and becoming clammy when was infused with IV cipro on 08/09/14. Can tolerate PO Ciprofloxacin     Consultations:  None   Procedures/Studies: Ct Angio Chest Pe W Or Wo Contrast  Addendum Date: 03/07/2019   ADDENDUM REPORT: 03/07/2019 16:47 ADDENDUM: Critical Value/emergent results were called by telephone at the time of interpretation on 03/07/2019 at 4:46 pm to providerDr. Myrene Buddy , who verbally acknowledged these results. Electronically Signed  By: Kerby Moors M.D.   On: 03/07/2019 16:47   Result Date: 03/07/2019 CLINICAL DATA:  Evaluate for PE.  Dyspnea. EXAM: CT ANGIOGRAPHY CHEST WITH CONTRAST TECHNIQUE: Multidetector CT imaging of the chest was performed using the standard protocol during bolus administration of intravenous contrast. Multiplanar CT image reconstructions and MIPs were obtained to evaluate the vascular anatomy. CONTRAST:  131mL OMNIPAQUE IOHEXOL 350 MG/ML SOLN COMPARISON:  None FINDINGS: Cardiovascular: There is posterior right lower lobe segmental and subsegmental pulmonary artery filling defect compatible with acute pulmonary embolus, image 79/5. Normal heart size.  No pericardial effusion. Mediastinum/Nodes: Normal appearance of the thyroid gland. Normal appearance of the thyroid gland. The trachea appears patent and is midline. No enlarged mediastinal or hilar lymph nodes. Lungs/Pleura: No pleural effusion. Diffuse bilateral interstitial opacities are identified within the  upper and lower lung zones compatible with either viral pneumonia or inflammation. No lobar consolidation. Upper Abdomen: Unchanged 2.9 cm right adrenal gland adenoma. Hepatic steatosis. No acute abnormality. Musculoskeletal: No chest wall abnormality. No acute or significant osseous findings. Review of the MIP images confirms the above findings. IMPRESSION: 1. Exam positive for segmental and subsegmental pulmonary artery filling defects to the posterior right lower lobe compatible with acute pulmonary embolus. 2. Diffuse bilateral upper and lower lung zone interstitial opacities compatible with atypical infection. 3. Hepatic steatosis 4. Unchanged right adrenal gland adenoma. Electronically Signed: By: Kerby Moors M.D. On: 03/07/2019 16:40   Dg Chest Portable 1 View  Result Date: 03/07/2019 CLINICAL DATA:  Chest pain, shortness of breath EXAM: PORTABLE CHEST 1 VIEW COMPARISON:  02/12/2019 FINDINGS: Low lung volumes. Heart is borderline in size. Again noted are bilateral pulmonary infiltrates. Some improvement in lung bases since prior study. No effusions. IMPRESSION: Low lung volumes. Bilateral pulmonary infiltrates with improvement in the lung bases since prior study. Electronically Signed   By: Rolm Baptise M.D.   On: 03/07/2019 00:05   Dg Chest Port 1 View  Result Date: 02/13/2019 CLINICAL DATA:  Pneumonia due to COVID-19 EXAM: PORTABLE CHEST 1 VIEW COMPARISON:  02/12/2019 FINDINGS: Very low lung volumes. Patchy bilateral airspace disease is similar to prior study. Heart is normal size. No effusions or acute bony abnormality. IMPRESSION: Very low lung volumes. Continued patchy bilateral airspace disease, unchanged. Electronically Signed   By: Rolm Baptise M.D.   On: 02/13/2019 07:58   Dg Chest Port 1 View  Result Date: 02/12/2019 CLINICAL DATA:  COVID-19 pneumonia. EXAM: PORTABLE CHEST 1 VIEW COMPARISON:  02/11/2019 FINDINGS: The heart size and mediastinal contours are within normal limits. Similar  appearance of bilateral pulmonary infiltrates in a peripheral and lower zone predominance with potentially slight worsening at the left lung base. No visualized pleural effusions or pneumothorax. The visualized skeletal structures are unremarkable. IMPRESSION: Similar bilateral pulmonary infiltrates with potential slight worsening at the left lung base. Electronically Signed   By: Aletta Edouard M.D.   On: 02/12/2019 07:51   Dg Chest Port 1 View  Result Date: 02/11/2019 CLINICAL DATA:  Shortness of breath.  COVID-19 positive. EXAM: PORTABLE CHEST 1 VIEW COMPARISON:  Radiograph 02/03/2015 FINDINGS: Low volumes assessment. Patchy bilateral peripheral predominant lung opacities. Grossly normal heart size for technique. No large pleural effusion or pneumothorax. IMPRESSION: Very low lung volumes with patchy bilateral airspace opacities consistent with COVID-19 pneumonia. Electronically Signed   By: Keith Rake M.D.   On: 02/11/2019 03:51      Subjective: Feeling well.  No chest pain, no vomiting.  Some nausea, but no dyspnea,  confusion, sputum, hemoptysis, melena.  Discharge Exam: Vitals:   03/09/19 0400 03/09/19 0839  BP: 118/69 (!) 130/92  Pulse: 62 69  Resp: 19 17  Temp: 98.4 F (36.9 C) 97.8 F (36.6 C)  SpO2: 94% 95%   Vitals:   03/09/19 0000 03/09/19 0340 03/09/19 0400 03/09/19 0839  BP: 128/79 128/71 118/69 (!) 130/92  Pulse: 74 65 62 69  Resp: (!) 21 18 19 17   Temp: 97.9 F (36.6 C) 98 F (36.7 C) 98.4 F (36.9 C) 97.8 F (36.6 C)  TempSrc: Oral Oral Oral Oral  SpO2: 93% 95% 94% 95%  Weight:      Height:        General: Pt is alert, awake, not in acute distress Cardiovascular: RRR, nl S1-S2, no murmurs appreciated.   No LE edema.   Respiratory: Normal respiratory rate and rhythm.  CTAB without rales or wheezes. Abdominal: Abdomen soft and non-tender.  No distension or HSM.   Neuro/Psych: Strength symmetric in upper and lower extremities.  Judgment and insight  appear normal.   The results of significant diagnostics from this hospitalization (including imaging, microbiology, ancillary and laboratory) are listed below for reference.     Microbiology: Recent Results (from the past 240 hour(s))  SARS Coronavirus 2 [facility] order, Performed in [facility] hospital lab) Nasopharyngeal Nasopharyngeal Swab     Status: Abnormal   Collection Time: 03/07/19 12:42 AM   Specimen: Nasopharyngeal Swab  Result Value Ref Range Status   SARS Coronavirus 2 POSITIVE (A) NEGATIVE Final    Comment: RESULT CALLED TO, READ BACK BY AND VERIFIED WITH: H LONG,RN @0149  03/07/19 MKELLY (NOTE) If result is NEGATIVE SARS-CoV-2 target nucleic acids are NOT DETECTED. The SARS-CoV-2 RNA is generally detectable in upper and lower  respiratory specimens during the acute phase of infection. The lowest  concentration of SARS-CoV-2 viral copies this assay can detect is 250  copies / mL. A negative result does not preclude SARS-CoV-2 infection  and should not be used as the sole basis for treatment or other  patient management decisions.  A negative result may occur with  improper specimen collection / handling, submission of specimen other  than nasopharyngeal swab, presence of viral mutation(s) within the  areas targeted by this assay, and inadequate number of viral copies  (<250 copies / mL). A negative result must be combined with clinical  observations, patient history, and epidemiological information. If result is POSITIVE SARS-CoV-2 target nucleic acids are DETECTED. The S ARS-CoV-2 RNA is generally detectable in upper and lower  respiratory specimens during the acute phase of infection.  Positive  results are indicative of active infection with SARS-CoV-2.  Clinical  correlation with patient history and other diagnostic information is  necessary to determine patient infection status.  Positive results do  not rule out bacterial infection or co-infection with other  viruses. If result is PRESUMPTIVE POSTIVE SARS-CoV-2 nucleic acids MAY BE PRESENT.   A presumptive positive result was obtained on the submitted specimen  and confirmed on repeat testing.  While 2019 novel coronavirus  (SARS-CoV-2) nucleic acids may be present in the submitted sample  additional confirmatory testing may be necessary for epidemiological  and / or clinical management purposes  to differentiate between  SARS-CoV-2 and other Sarbecovirus currently known to infect humans.  If clinically indicated additional testing with an alternate test  methodology [phone]) is adv ised. The SARS-CoV-2 RNA is generally  detectable in upper and lower respiratory specimens during the acute  phase of  infection. The expected result is Negative. Fact Sheet for Patients:  StrictlyIdeas.no Fact Sheet for Healthcare Providers: BankingDealers.co.za This test is not yet approved or cleared by the [place] FDA and has been authorized for detection and/or diagnosis of SARS-CoV-2 by FDA under an Emergency Use Authorization (EUA).  This EUA will remain in effect (meaning this test can be used) for the duration of the COVID-19 declaration under Section 564(b)(1) of the Act, 21 U.S.C. section 360bbb-3(b)(1), unless the authorization is terminated or revoked sooner. Performed at [facility], [address], [place], [place] [address]   Blood Culture (routine x 2)     Status: None (Preliminary result)   Collection Time: 03/07/19  3:08 AM   Specimen: BLOOD  Result Value Ref Range Status   Specimen Description BLOOD LEFT ANTECUBITAL  Final   Special Requests   Final    BOTTLES DRAWN AEROBIC AND ANAEROBIC Blood Culture adequate volume   Culture   Final    NO GROWTH 2 DAYS Performed at [facility], [address], [place], [place] [address]    Report Status PENDING  Incomplete  Blood Culture (routine x 2)     Status: None (Preliminary result)    Collection Time: 03/07/19  3:11 AM   Specimen: BLOOD  Result Value Ref Range Status   Specimen Description BLOOD RIGHT ANTECUBITAL  Final   Special Requests   Final    BOTTLES DRAWN AEROBIC AND ANAEROBIC Blood Culture adequate volume   Culture   Final    NO GROWTH 2 DAYS Performed at [facility], [address] [place], [place] [address]    Report Status PENDING  Incomplete     Labs: BNP (last 3 results) Recent Labs    03/07/19 1122  BNP [id number]   Basic Metabolic Panel: Recent Labs  Lab 03/06/19 2132 03/07/19 1122 03/08/19 0210 03/09/19 0218  NA 141  --  137 141  K 3.8  --  4.6 3.4*  CL 106  --  105 109  CO2 25  --  19* 24  GLUCOSE 123*  --  103* 98  BUN 10  --  19 24*  CREATININE 0.96 1.06* 1.38* 1.38*  CALCIUM 10.5*  --  8.1* 8.3*  MG  --   --  2.2 2.1  PHOS  --   --  4.9* 4.9*   Liver Function Tests: Recent Labs  Lab 03/08/19 0210 03/09/19 0218  AST 37 26  ALT 55* 49*  ALKPHOS 45 38  BILITOT 1.0 0.6  PROT 6.3* 5.8*  ALBUMIN 3.6 3.5   No results for input(s): LIPASE, AMYLASE in the last 168 hours. No results for input(s): AMMONIA in the last 168 hours. CBC: Recent Labs  Lab 03/06/19 2132 03/07/19 1122 03/08/19 0210 03/09/19 0218  WBC 6.9 9.1 13.8* 6.8  NEUTROABS 4.5  --  9.3* 2.8  HGB 15.7* 15.5* 13.3 12.9  HCT 48.3* 45.8 41.5 39.3  MCV 94.5 93.7 95.6 94.9  PLT 186 187 165 139*   Cardiac Enzymes: No results for input(s): CKTOTAL, CKMB, CKMBINDEX, TROPONINI in the last 168 hours. BNP: Invalid input(s): POCBNP CBG: No results for input(s): GLUCAP in the last 168 hours. D-Dimer Recent Labs    03/06/19 2132  DDIMER 0.45   Hgb A1c No results for input(s): HGBA1C in the last 72 hours. Lipid Profile Recent Labs    03/06/19 2132  TRIG 134   Thyroid function studies No results for input(s): TSH, T4TOTAL, T3FREE, THYROIDAB in the last 72 hours.  Invalid input(s): FREET3 Anemia work up Recent Labs    03/06/19 2132  FERRITIN 158    Urinalysis    Component Value Date/Time   COLORURINE YELLOW 08/09/2014 1405   APPEARANCEUR CLOUDY (A) 08/09/2014 1405   LABSPEC 1.025 04/18/2017 0939   PHURINE 5.0 04/18/2017 0939   GLUCOSEU NEGATIVE 04/18/2017 0939   HGBUR NEGATIVE 04/18/2017 0939   BILIRUBINUR Negative 08/21/2017 1227   KETONESUR NEGATIVE 04/18/2017 0939   PROTEINUR Negative 08/21/2017 1227   PROTEINUR NEGATIVE 04/18/2017 0939   UROBILINOGEN 0.2 08/21/2017 1227   UROBILINOGEN 0.2 04/18/2017 0939   NITRITE Negative 08/21/2017 1227   NITRITE NEGATIVE 04/18/2017 0939   LEUKOCYTESUR Negative 08/21/2017 1227   Sepsis Labs Invalid input(s): PROCALCITONIN,  WBC,  LACTICIDVEN Microbiology Recent Results (from the past 240 hour(s))  SARS Coronavirus 2 [facility] order, Performed in [facility] hospital lab) Nasopharyngeal Nasopharyngeal Swab     Status: Abnormal   Collection Time: 03/07/19 12:42 AM   Specimen: Nasopharyngeal Swab  Result Value Ref Range Status   SARS Coronavirus 2 POSITIVE (A) NEGATIVE Final    Comment: RESULT CALLED TO, READ BACK BY AND VERIFIED WITH: H LONG,RN @0149  03/07/19 MKELLY (NOTE) If result is NEGATIVE SARS-CoV-2 target nucleic acids are NOT DETECTED. The SARS-CoV-2 RNA is generally detectable in upper and lower  respiratory specimens during the acute phase of infection. The lowest  concentration of SARS-CoV-2 viral copies this assay can detect is 250  copies / mL. A negative result does not preclude SARS-CoV-2 infection  and should not be used as the sole basis for treatment or other  patient management decisions.  A negative result may occur with  improper specimen collection / handling, submission of specimen other  than nasopharyngeal swab, presence of viral mutation(s) within the  areas targeted by this assay, and inadequate number of viral copies  (<250 copies / mL). A negative result must be combined with clinical  observations, patient history, and epidemiological  information. If result is POSITIVE SARS-CoV-2 target nucleic acids are DETECTED. The S ARS-CoV-2 RNA is generally detectable in upper and lower  respiratory specimens during the acute phase of infection.  Positive  results are indicative of active infection with SARS-CoV-2.  Clinical  correlation with patient history and other diagnostic information is  necessary to determine patient infection status.  Positive results do  not rule out bacterial infection or co-infection with other viruses. If result is PRESUMPTIVE POSTIVE SARS-CoV-2 nucleic acids MAY BE PRESENT.   A presumptive positive result was obtained on the submitted specimen  and confirmed on repeat testing.  While 2019 novel coronavirus  (SARS-CoV-2) nucleic acids may be present in the submitted sample  additional confirmatory testing may be necessary for epidemiological  and / or clinical management purposes  to differentiate between  SARS-CoV-2 and other Sarbecovirus currently known to infect humans.  If clinically indicated additional testing with an alternate test  methodology [phone]) is adv ised. The SARS-CoV-2 RNA is generally  detectable in upper and lower respiratory specimens during the acute  phase of infection. The expected result is Negative. Fact Sheet for Patients:  StrictlyIdeas.no Fact Sheet for Healthcare Providers: BankingDealers.co.za This test is not yet approved or cleared by the [place] FDA and has been authorized for detection and/or diagnosis of SARS-CoV-2 by FDA under an Emergency Use Authorization (EUA).  This EUA will remain in effect (meaning this test can be used) for the duration of the COVID-19 declaration under Section 564(b)(1) of the Act, 21 U.S.C. section  360bbb-3(b)(1), unless the authorization is terminated or revoked sooner. Performed at [facility], [address], [place], [place] [address]   Blood Culture (routine x 2)      Status: None (Preliminary result)   Collection Time: 03/07/19  3:08 AM   Specimen: BLOOD  Result Value Ref Range Status   Specimen Description BLOOD LEFT ANTECUBITAL  Final   Special Requests   Final    BOTTLES DRAWN AEROBIC AND ANAEROBIC Blood Culture adequate volume   Culture   Final    NO GROWTH 2 DAYS Performed at [facility], [address], [place], [place] [address]    Report Status PENDING  Incomplete  Blood Culture (routine x 2)     Status: None (Preliminary result)   Collection Time: 03/07/19  3:11 AM   Specimen: BLOOD  Result Value Ref Range Status   Specimen Description BLOOD RIGHT ANTECUBITAL  Final   Special Requests   Final    BOTTLES DRAWN AEROBIC AND ANAEROBIC Blood Culture adequate volume   Culture   Final    NO GROWTH 2 DAYS Performed at [facility], [address] [place], [place] [address]    Report Status PENDING  Incomplete     Time coordinating discharge: 40 minutes      SIGNED:   Edwin Dada, MD  [facility] 03/09/2019, 10:33 AM

## 2019-03-09 NOTE — TOC Benefit Eligibility Note (Signed)
Transition of Care [facility]) Benefit Eligibility Note    Patient Details  Name: AILANA GANSKE MRN: [id number] Date of Birth: 03-31-1981   Medication/Dose: Eliquis        Prescription Coverage Preferred Pharmacy: [place] with Person/Company/Phone Number:: [place]  Co-Pay: WITH Coupon- 1st month $0/ after that WITH coupon $10             Delorse Lek Phone Number: 03/09/2019, 10:48 AM

## 2019-03-09 NOTE — TOC Progression Note (Addendum)
Transition of Care [facility]) - Progression Note    Patient Details  Name: Paula King MRN: [id number] Date of Birth: 1981/01/14  Transition of Care [facility]) CM/SW Contact  Loletha Grayer Beverely Pace, RN Phone Number: 03/09/2019, 9:44 AM  Clinical Narrative: 38 yr old female readmitted with COVID related symptoms. Has pneumonia and PE. Will likely go home on Eliquis, CM will call information for 30 day free card to [facility] and will provide her with $10.00 copay card as well..Called   Case manager has reached out to Leadership for assistance with getting assistance for  patient's oxygen for home. Case Manager is contacting [employer] fast streamer, Estill Bamberg to see if they will accept a a single case agreement for payment for patient with [employer] coverage. Estill Bamberg is speaking with [employer] for May and reaching out to the [place] office . Case manager has spoken with patient concerning her oxygen.   10:15am: CM called [facility] outpatient [facility] with Eliquis information: BIN [id number] Group [id number] PCN [id number] ID [id number]. Case manager printed $10.00 copay card to the unit for RN to give patient. CM also contacted patient via telephone and explained about medication. CM also explained that we are working on arranging oxygen.        Expected Discharge Plan and Services           Expected Discharge Date: 03/09/19                                     Social Determinants of Health (SDOH) Interventions    Readmission Risk Interventions No flowsheet data found.

## 2019-03-12 ENCOUNTER — Other Ambulatory Visit: Payer: Self-pay

## 2019-03-12 ENCOUNTER — Other Ambulatory Visit: Payer: Self-pay | Admitting: *Deleted

## 2019-03-12 ENCOUNTER — Encounter: Payer: Self-pay | Admitting: *Deleted

## 2019-03-12 LAB — CULTURE, BLOOD (ROUTINE X 2)
Culture: NO GROWTH
Culture: NO GROWTH
Special Requests: ADEQUATE
Special Requests: ADEQUATE

## 2019-03-12 NOTE — Patient Outreach (Signed)
[facility]) Care Management  03/12/2019  Paula King 23-Dec-1980 [id number]  Transition of care call/case closure   Referral received: 03/08/19 Initial outreach: 03/12/19 Insurance: [place] [employer]   Subjective: Initial successful telephone call to patient's home/mobile number in order to complete transition of care assessment; 2 HIPAA identifiers verified. Explained purpose of call and completed transition of care assessment.  Paula King states she is doing "much better" says she uses her O2 prn usually with activity as her oxygen saturations will drop to 88-90 with exertion.  She denies any ongoing health issues and says she does not need a referral to one of the [place] chronic disease management programs.  She says he/she uses a [facility] outpatient pharmacy.  She denies educational needs related to staying safe during the COVID 19 pandemic.    Objective:  Paula King was hospitalized at [facility] from 9/9-9/11 for shortness of breath and chest pain. She was diagnosed with a posterior right lower lobe segmental and subsegmental pulmonary embolism.  Comorbidities include: positive COVID 19 infection with pneumonia , HTN, prediabetes, diverticulitis, adrenal adenoma, Cushing Syndrome, goiter, attention deficit disorder, morbid obesity, and abnormal liver function She was discharged to home on 03/09/19 without the need for home health services and with home and portable oxygen provided by [employer].   Assessment:  Patient voices good understanding of all discharge instructions.  See transition of care flowsheet for assessment details.   Plan:  Reviewed hospital discharge diagnosis of pulmonary embolism and treatment plan using hospital discharge instructions, assessing medication adherence, reviewing problems requiring provider notification, and discussing the importance of follow up with primary care provider and/or specialists as  directed. No ongoing care management needs identified so will close case to [place] Management services. Will not route successful outreach letter with [place] Management pamphlet and 24 Hour Nurse Line Magnet to [place] Management clinical pool since patient received a letter with the materials last month, on 02/20/19, after her hospital stay of 8/16-8/24.   Barrington Ellison RN,CCM,CDE [place] Management Coordinator Office Phone [phone] Office Fax [phone]

## 2019-03-13 ENCOUNTER — Encounter: Payer: Self-pay | Admitting: Family Medicine

## 2019-03-13 ENCOUNTER — Ambulatory Visit (INDEPENDENT_AMBULATORY_CARE_PROVIDER_SITE_OTHER): Payer: No Typology Code available for payment source | Admitting: Family Medicine

## 2019-03-13 VITALS — BP 136/82 | HR 100 | Temp 98.5°F | Resp 20 | Ht 64.0 in | Wt 265.0 lb

## 2019-03-13 DIAGNOSIS — K219 Gastro-esophageal reflux disease without esophagitis: Secondary | ICD-10-CM

## 2019-03-13 DIAGNOSIS — I2699 Other pulmonary embolism without acute cor pulmonale: Secondary | ICD-10-CM | POA: Diagnosis not present

## 2019-03-13 DIAGNOSIS — J9601 Acute respiratory failure with hypoxia: Secondary | ICD-10-CM

## 2019-03-13 DIAGNOSIS — U071 COVID-19: Secondary | ICD-10-CM

## 2019-03-13 MED ORDER — PANTOPRAZOLE SODIUM 40 MG PO TBEC: 40.0000 mg | DELAYED_RELEASE_TABLET | Freq: Every day | ORAL | 3 refills | Status: DC

## 2019-03-13 MED ORDER — ATENOLOL 25 MG PO TABS: 12.5000 mg | ORAL_TABLET | Freq: Every day | ORAL | 3 refills | Status: DC

## 2019-03-13 MED ORDER — TRAMADOL HCL 50 MG PO TABS: 50.0000 mg | ORAL_TABLET | Freq: Three times a day (TID) | ORAL | 0 refills | Status: AC | PRN

## 2019-03-13 NOTE — Patient Instructions (Addendum)
Okay to use atenolol 12.5mg  for tachycardia, hypertension GERD try the protonix once a day  F/U 2 weeks

## 2019-03-13 NOTE — Progress Notes (Signed)
Subjective:    Patient ID: Paula King, female    DOB: 14-Oct-1980, 38 y.o.   MRN: [id number]  Patient presents for Hospital F/U (COVID+) Here for hospital follow-up.  See previous notation she is diagnosed with COVID 19 back in August she was admitted to [facility] secondary to moaning associated.  She was treated with antiviral drugs as well as prednisone.  When she got the hospital she was still having difficulty breathing however they were unable to arrange oxygen therapy for her initially.  I did give her a course of steroids which helped temporarily however over the next week or so her breathing continued to worsen.  We did have a telephone visit on 9 4 as she had missed her appointment that day she states that she just felt so fatigued.  Her breathing worsened and she went back to the emergency room she was found to have a pulmonary embolism and was readmitted to [facility].  He was started on Lovenox and then transition to Eliquis which needs to be treated for 3 months as this is a complication related to previous coronavirus infection.  Is also concerned about Cushing's syndrome she does follow with endocrinology already for her cortisol levels concern at this could have also increase her risk of coagulopathy.   She is currently on 2 L of oxygen with activity only she does get winded if she goes to the mailbox gets up to get a shower.  She does use her rescue inhaler as needed.  She is not having difficulty sleeping as she often sleeps on her abdomen anyway at rest her oxygen is typically 93 to 96%    HR has been up and down, upward 110 BP has been 130s to 140s over 80s and 90s.  She does have atenolol on hand but has not taken recently.  She has an appointment of note for follow-up with her endocrinologist on October 2.    Hospital notes reviewed.  Review Of Systems:  GEN- denies fatigue, fever, weight loss,weakness, recent illness HEENT- denies eye  drainage, change in vision, nasal discharge, CVS- denies chest pain, palpitations RESP- denies SOB, cough, wheeze ABD- denies N/V, change in stools, abd pain GU- denies dysuria, hematuria, dribbling, incontinence MSK- denies joint pain, muscle aches, injury Neuro- denies headache, dizziness, syncope, seizure activity       Objective:    BP 136/82   Pulse 100   Temp 98.5 F (36.9 C) (Oral)   Resp 20   Ht 5\' 4"  (1.626 m)   Wt 265 lb (120.2 kg)   LMP  (LMP Unknown)   SpO2 96% Comment: 2L/min via [place]  BMI 45.49 kg/m  GEN- NAD, alert and oriented x3 HEENT- PERRL, EOMI, non injected sclera, pink conjunctiva, MMM, oropharynx clear Neck- Supple, CVS-tachycardia, no murmur RESP-CTAB, wearing 2 L of oxygen no bronchospasm ABD-NABS,soft,NT,ND, bruising on abdomen from Lovenox injections  EXT- No edema Pulses- Radial, DP- 2+        Assessment & Plan:      Problem List Items Addressed This Visit      Unprioritized   Acute respiratory failure with hypoxia ([place])   COVID-19 virus infection - Primary   GERD (gastroesophageal reflux disease)   Relevant Medications   pantoprazole (PROTONIX) 40 MG tablet   Pulmonary embolism (HCC)    Pulmonary embolism thought to be associated with her recent COVID-19 infection.  She does not have active COVID-19 does have antibodies against the coronavirus.  She is doing well with her oxygen throughout the day I think that she will be able to come off of this fairly quickly as she is not having hypoxia at night and increasing is improving daily with her activities.  He did ask for something she could use for her acid reflux they took her off of the famotidine will give her Protonix this should not interfere with her Eliquis.  Recommend treatment for 3 months for the pulmonary embolism as we do have a cause.   Note we did discuss red flags and when she was should return to the emergency room if she does have hemoptysis worsening shortness of breath  fever chills.  She is going to use her incentive spirometer  Check her in 2 weeks decide on returning to work For her tachycardia episodes mild elevated hypertension she has had this on and off in the setting of her Cushing's syndrome which is still being worked up by endocrinology.  I think she is okay to use atenolol 12.5 mg for the tachycardia this is likely multifactorial at this status she also has underlying pulmonary embolism.  For chest discomfort in the setting of blood clot muscular pain I have given her tramadol she did tolerate this in the emergency room        Relevant Medications   atenolol (TENORMIN) 25 MG tablet   Other Relevant Orders   CBC with Differential/Platelet   Comprehensive metabolic panel      Note: This dictation was prepared with Dragon dictation along with smaller phrase technology. Any transcriptional errors that result from this process are unintentional.

## 2019-03-13 NOTE — Assessment & Plan Note (Addendum)
Pulmonary embolism thought to be associated with her recent COVID-19 infection.  She does not have active COVID-19 does have antibodies against the coronavirus.  She is doing well with her oxygen throughout the day I think that she will be able to come off of this fairly quickly as she is not having hypoxia at night and increasing is improving daily with her activities.  He did ask for something she could use for her acid reflux they took her off of the famotidine will give her Protonix this should not interfere with her Eliquis.  Recommend treatment for 3 months for the pulmonary embolism as we do have a cause.   Note we did discuss red flags and when she was should return to the emergency room if she does have hemoptysis worsening shortness of breath fever chills.  She is going to use her incentive spirometer  Check her in 2 weeks decide on returning to work For her tachycardia episodes mild elevated hypertension she has had this on and off in the setting of her Cushing's syndrome which is still being worked up by endocrinology.  I think she is okay to use atenolol 12.5 mg for the tachycardia this is likely multifactorial at this status she also has underlying pulmonary embolism.  For chest discomfort in the setting of blood clot muscular pain I have given her tramadol she did tolerate this in the emergency room

## 2019-03-14 LAB — CBC WITH DIFFERENTIAL/PLATELET
Absolute Monocytes: 883 cells/uL (ref 200–950)
Basophils Absolute: 49 cells/uL (ref 0–200)
Basophils Relative: 0.6 %
Eosinophils Absolute: 340 cells/uL (ref 15–500)
Eosinophils Relative: 4.2 %
HCT: 43.1 % (ref 35.0–45.0)
Hemoglobin: 15.1 g/dL (ref 11.7–15.5)
Lymphs Abs: 2762 cells/uL (ref 850–3900)
MCH: 31.4 pg (ref 27.0–33.0)
MCHC: 35 g/dL (ref 32.0–36.0)
MCV: 89.6 fL (ref 80.0–100.0)
MPV: 9.8 fL (ref 7.5–12.5)
Monocytes Relative: 10.9 %
Neutro Abs: 4066 cells/uL (ref 1500–7800)
Neutrophils Relative %: 50.2 %
Platelets: 255 10*3/uL (ref 140–400)
RBC: 4.81 10*6/uL (ref 3.80–5.10)
RDW: 14 % (ref 11.0–15.0)
Total Lymphocyte: 34.1 %
WBC: 8.1 10*3/uL (ref 3.8–10.8)

## 2019-03-14 LAB — COMPREHENSIVE METABOLIC PANEL
AG Ratio: 1.7 (calc) (ref 1.0–2.5)
ALT: 64 U/L — ABNORMAL HIGH (ref 6–29)
AST: 29 U/L (ref 10–30)
Albumin: 4.3 g/dL (ref 3.6–5.1)
Alkaline phosphatase (APISO): 48 U/L (ref 31–125)
BUN/Creatinine Ratio: 12 (calc) (ref 6–22)
BUN: 13 mg/dL (ref 7–25)
CO2: 23 mmol/L (ref 20–32)
Calcium: 9.7 mg/dL (ref 8.6–10.2)
Chloride: 106 mmol/L (ref 98–110)
Creat: 1.11 mg/dL — ABNORMAL HIGH (ref 0.50–1.10)
Globulin: 2.5 g/dL (calc) (ref 1.9–3.7)
Glucose, Bld: 102 mg/dL — ABNORMAL HIGH (ref 65–99)
Potassium: 3.5 mmol/L (ref 3.5–5.3)
Sodium: 141 mmol/L (ref 135–146)
Total Bilirubin: 0.5 mg/dL (ref 0.2–1.2)
Total Protein: 6.8 g/dL (ref 6.1–8.1)

## 2019-03-23 ENCOUNTER — Encounter (HOSPITAL_COMMUNITY): Payer: Self-pay

## 2019-03-23 ENCOUNTER — Emergency Department (HOSPITAL_COMMUNITY): Payer: No Typology Code available for payment source

## 2019-03-23 ENCOUNTER — Emergency Department (HOSPITAL_COMMUNITY)
Admission: EM | Admit: 2019-03-23 | Discharge: 2019-03-24 | Disposition: A | Discharge status: Home / Self Care | Payer: No Typology Code available for payment source | Attending: Emergency Medicine | Admitting: Emergency Medicine

## 2019-03-23 ENCOUNTER — Other Ambulatory Visit: Payer: Self-pay

## 2019-03-23 DIAGNOSIS — U071 COVID-19: Secondary | ICD-10-CM | POA: Insufficient documentation

## 2019-03-23 DIAGNOSIS — I10 Essential (primary) hypertension: Secondary | ICD-10-CM | POA: Diagnosis not present

## 2019-03-23 DIAGNOSIS — Z8616 Personal history of COVID-19: Secondary | ICD-10-CM

## 2019-03-23 DIAGNOSIS — R0602 Shortness of breath: Secondary | ICD-10-CM

## 2019-03-23 DIAGNOSIS — Z79899 Other long term (current) drug therapy: Secondary | ICD-10-CM | POA: Insufficient documentation

## 2019-03-23 DIAGNOSIS — I2699 Other pulmonary embolism without acute cor pulmonale: Secondary | ICD-10-CM | POA: Insufficient documentation

## 2019-03-23 DIAGNOSIS — Z8619 Personal history of other infectious and parasitic diseases: Secondary | ICD-10-CM

## 2019-03-23 DIAGNOSIS — Z7901 Long term (current) use of anticoagulants: Secondary | ICD-10-CM | POA: Insufficient documentation

## 2019-03-23 LAB — CBC WITH DIFFERENTIAL/PLATELET
Abs Immature Granulocytes: 0.11 10*3/uL — ABNORMAL HIGH (ref 0.00–0.07)
Basophils Absolute: 0.1 10*3/uL (ref 0.0–0.1)
Basophils Relative: 1 %
Eosinophils Absolute: 0.1 10*3/uL (ref 0.0–0.5)
Eosinophils Relative: 1 %
HCT: 41.4 % (ref 36.0–46.0)
Hemoglobin: 13.7 g/dL (ref 12.0–15.0)
Immature Granulocytes: 1 %
Lymphocytes Relative: 23 %
Lymphs Abs: 2.4 10*3/uL (ref 0.7–4.0)
MCH: 30.8 pg (ref 26.0–34.0)
MCHC: 33.1 g/dL (ref 30.0–36.0)
MCV: 93 fL (ref 80.0–100.0)
Monocytes Absolute: 0.9 10*3/uL (ref 0.1–1.0)
Monocytes Relative: 9 %
Neutro Abs: 7 10*3/uL (ref 1.7–7.7)
Neutrophils Relative %: 65 %
Platelets: 353 10*3/uL (ref 150–400)
RBC: 4.45 MIL/uL (ref 3.87–5.11)
RDW: 14.3 % (ref 11.5–15.5)
WBC: 10.6 10*3/uL — ABNORMAL HIGH (ref 4.0–10.5)
nRBC: 0 % (ref 0.0–0.2)

## 2019-03-23 LAB — COMPREHENSIVE METABOLIC PANEL
ALT: 43 U/L (ref 0–44)
AST: 25 U/L (ref 15–41)
Albumin: 3.9 g/dL (ref 3.5–5.0)
Alkaline Phosphatase: 48 U/L (ref 38–126)
Anion gap: 12 (ref 5–15)
BUN: 12 mg/dL (ref 6–20)
CO2: 20 mmol/L — ABNORMAL LOW (ref 22–32)
Calcium: 9.3 mg/dL (ref 8.9–10.3)
Chloride: 107 mmol/L (ref 98–111)
Creatinine, Ser: 0.84 mg/dL (ref 0.44–1.00)
GFR calc Af Amer: >60 mL/min (ref >60)
GFR calc non Af Amer: >60 mL/min (ref >60)
Glucose, Bld: 147 mg/dL — ABNORMAL HIGH (ref 70–99)
Potassium: 3.6 mmol/L (ref 3.5–5.1)
Sodium: 139 mmol/L (ref 135–145)
Total Bilirubin: 0.6 mg/dL (ref 0.3–1.2)
Total Protein: 7 g/dL (ref 6.5–8.1)

## 2019-03-23 LAB — TROPONIN I (HIGH SENSITIVITY): Troponin I (High Sensitivity): <2 ng/L (ref <18)

## 2019-03-23 LAB — SARS CORONAVIRUS 2 BY RT PCR (HOSPITAL ORDER, PERFORMED IN ~~LOC~~ HOSPITAL LAB): SARS Coronavirus 2: POSITIVE — AB

## 2019-03-23 IMAGING — CT CT ANGIO CHEST
2 of 6 series · 17 of 46 positions shown · IV contrast (omnipaque)
Comparison: Prior CT from [DATE].

CLINICAL DATA: Initial evaluation for persistent dyspnea. Recent
diagnosis of COVID as well as PE.

EXAM:
CT ANGIOGRAPHY CHEST WITH CONTRAST
TECHNIQUE: Multidetector CT imaging of the chest was performed using the
standard protocol during bolus administration of intravenous
contrast. Multiplanar CT image reconstructions and MIPs were
obtained to evaluate the vascular anatomy.
CONTRAST:  100mL OMNIPAQUE IOHEXOL 350 MG/ML SOLN

[Series 5: pe axial thins · axial · 0.89mm/px · z∈[-587,-346]mm · 14 of 265 slices shown]
[im 12/265  lung]
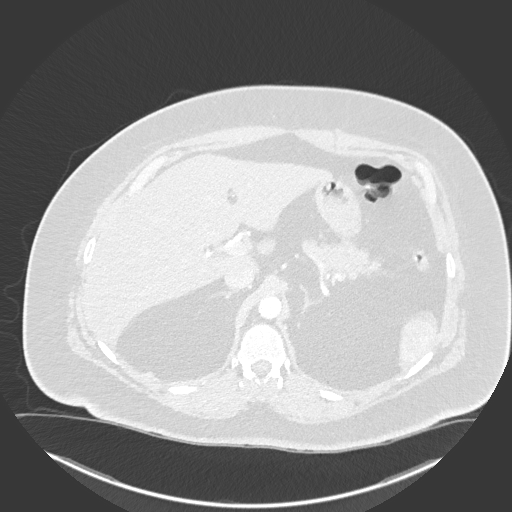
[im 35/265  soft-tissue]
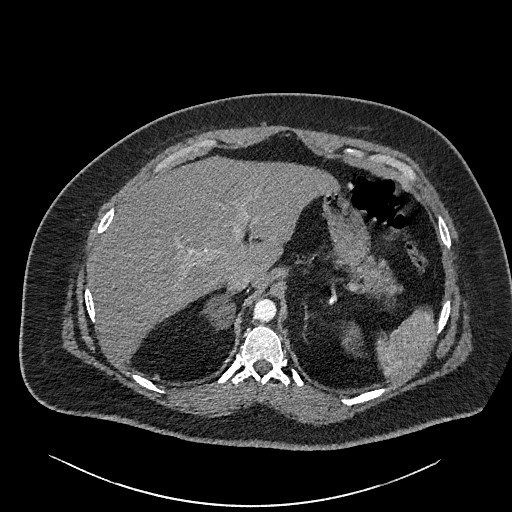
[im 46/265  lung]
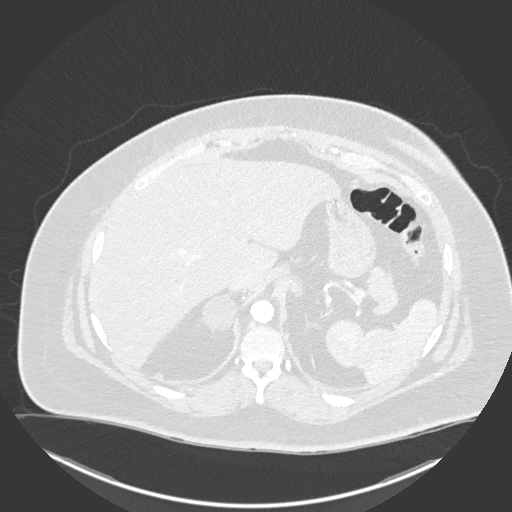
[im 69/265  soft-tissue]
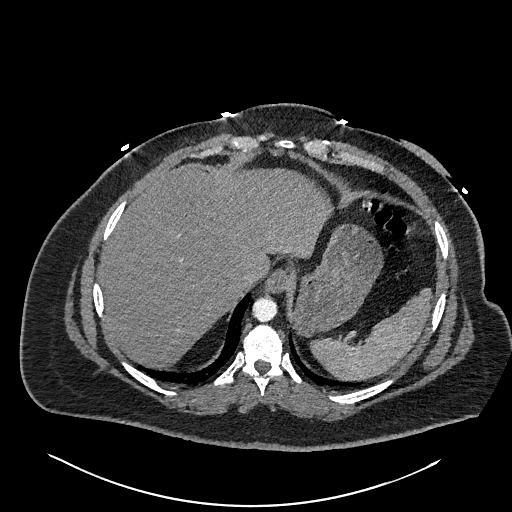
[im 92/265  lung]
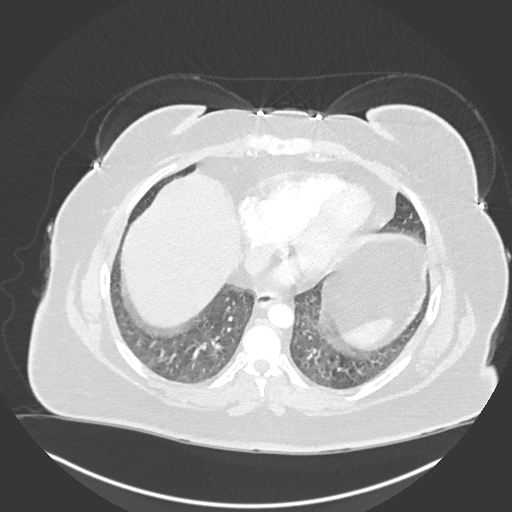
[im 104/265  soft-tissue]
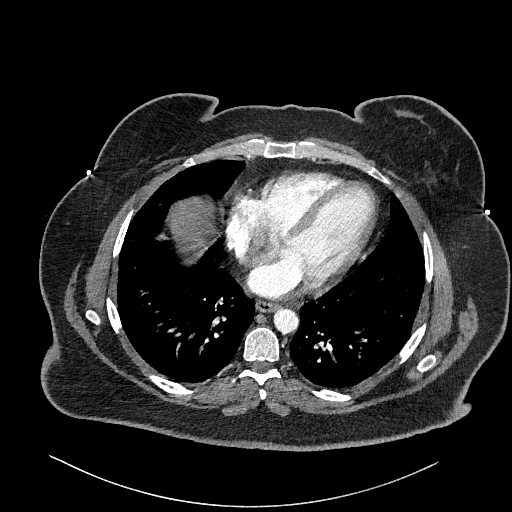
[im 127/265  lung]
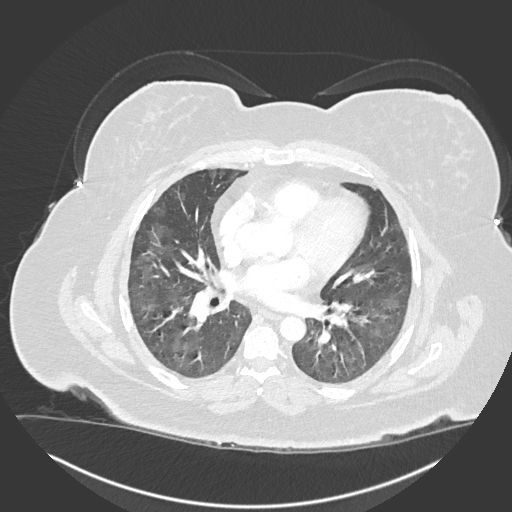
[im 138/265  soft-tissue]
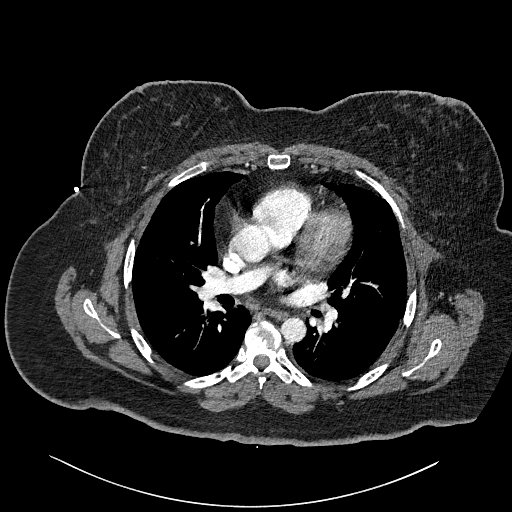
[im 161/265  lung]
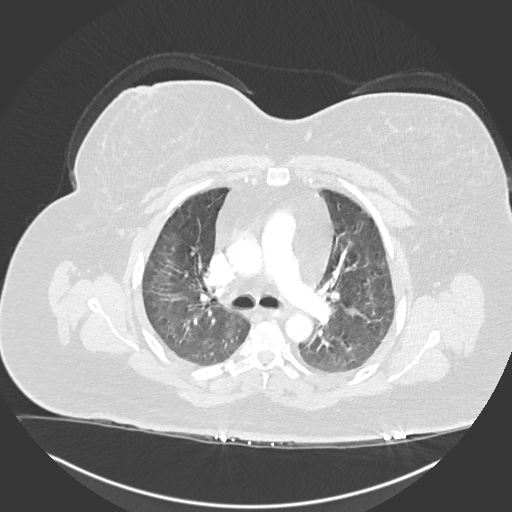
[im 173/265  soft-tissue]
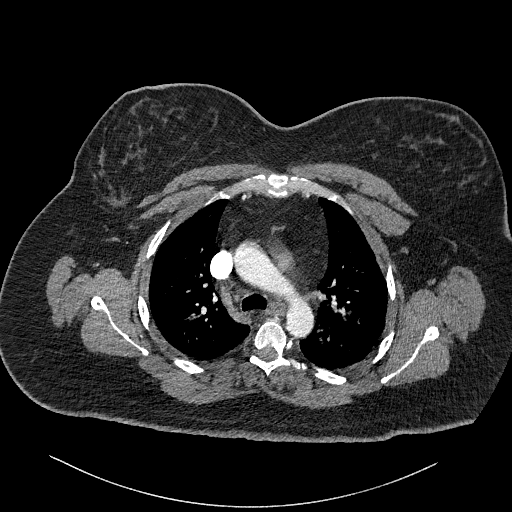
[im 196/265  lung]
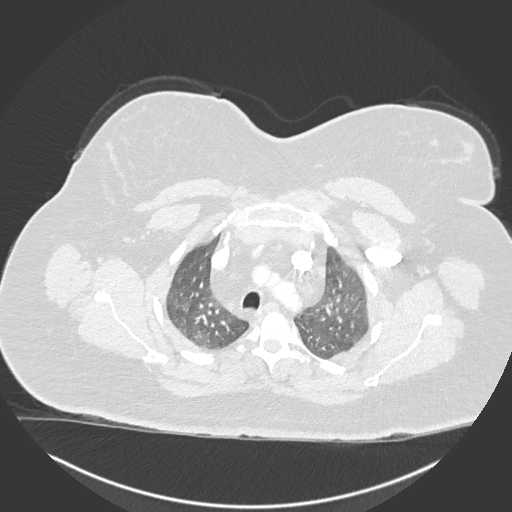
[im 219/265  soft-tissue]
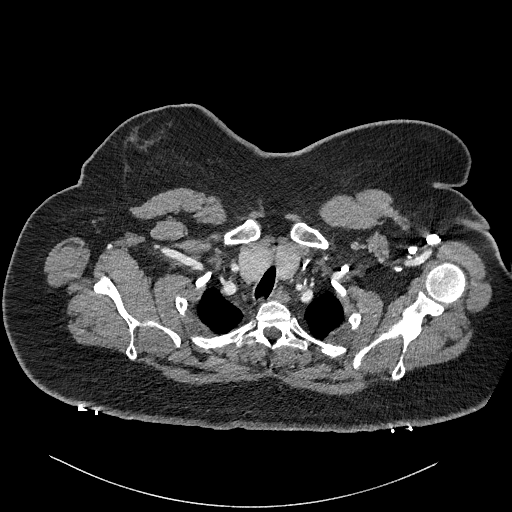
[im 230/265  lung]
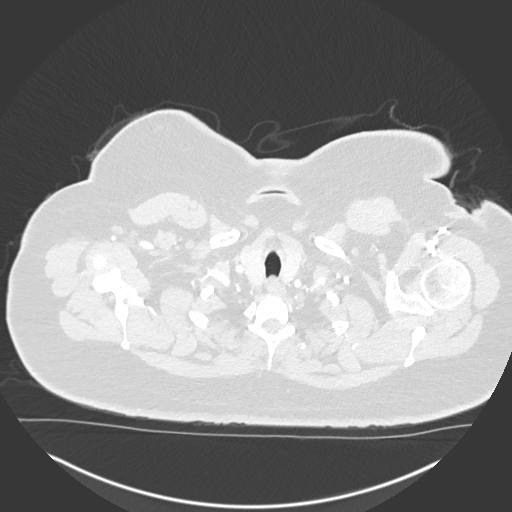
[im 253/265  soft-tissue]
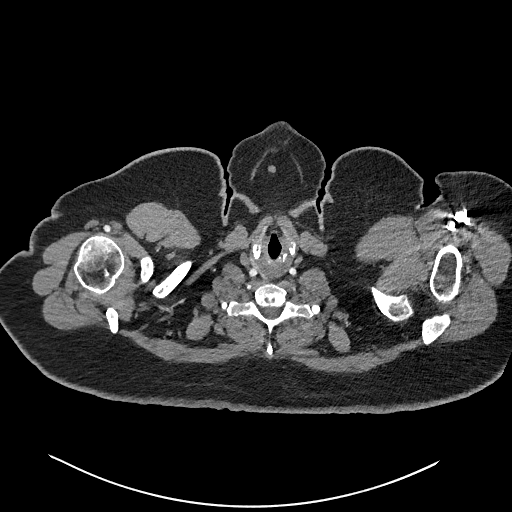

[Series 7: cor soft · coronal · 0.56mm/px · 3 of 166 slices shown]
[im 42/166  soft-tissue]
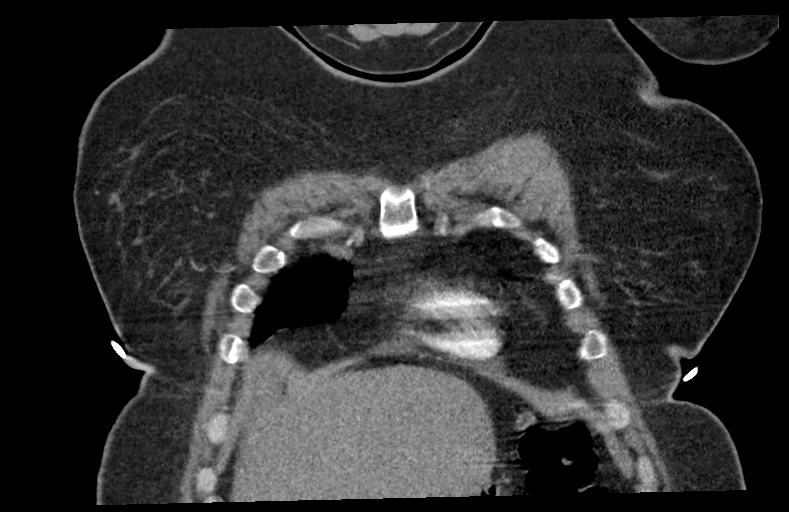
[im 83/166  soft-tissue]
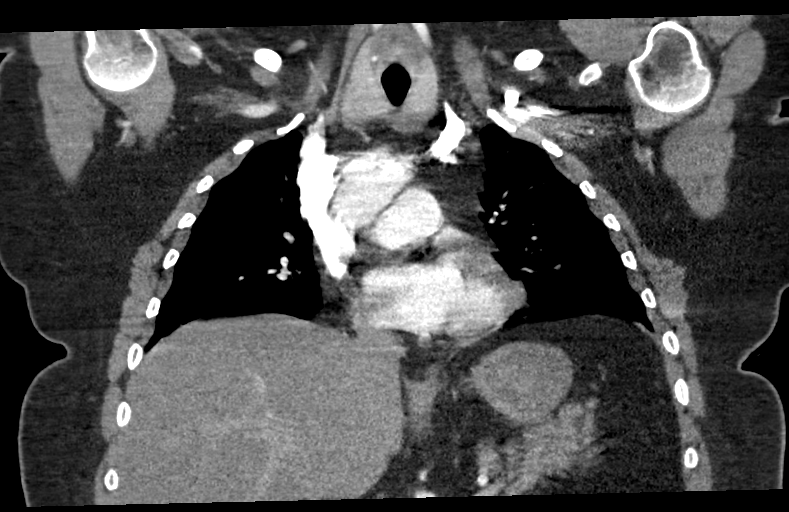
[im 124/166  soft-tissue]
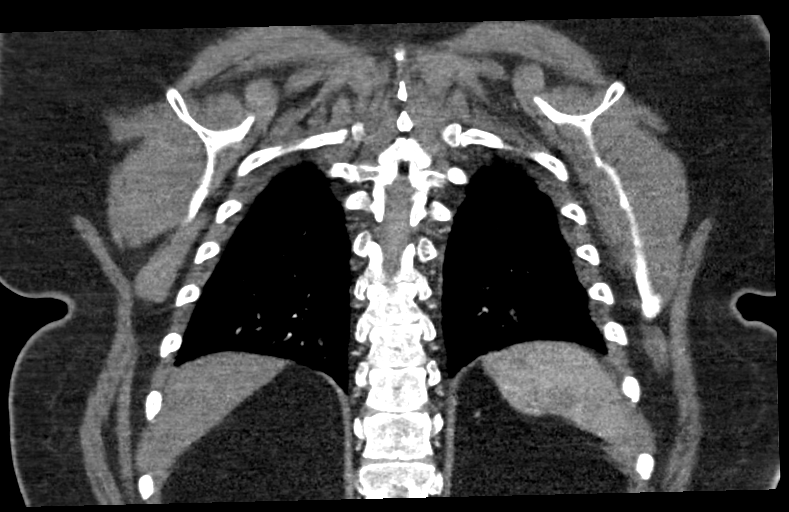

[17 of 46 positions shown; findings below may reference images not displayed]

FINDINGS: Cardiovascular: Intrathoracic aorta of normal caliber without
aneurysm or other acute abnormality. Partially visualized great
vessels within normal limits. Heart size normal. No pericardial
effusion.

Evaluation of the pulmonary arterial tree mildly limited by
respiratory motion artifact. Previously seen small filling defects
within the right lower lobe are no longer clearly visualized. No
convincing or definite filling defects seen on today's exam to
suggest acute or recurrent pulmonary embolism. Re-formatted imaging
confirms these findings.

Mediastinum/Nodes: Thyroid within normal limits. No pathologically
enlarged mediastinal, hilar, or axillary lymph nodes. Normal
esophagus.

Lungs/Pleura: Tracheobronchial tree intact and patent. Lungs
normally inflated. Persistent heterogeneous multifocal ground-glass
opacity seen involving the bilateral lungs, improved as compared to
previous exam, compatible with improving and/or resolving viral
pneumonia. No new consolidative airspace disease. No pulmonary edema
or pleural effusion. No pneumothorax. No worrisome pulmonary nodule
or mass.

Upper Abdomen: Visualized upper abdomen demonstrates no acute
finding. Diffuse hypoattenuation of the liver suggestive of
steatosis. 3.2 cm right adrenal adenoma noted. Prior
cholecystectomy.

Musculoskeletal: No acute osseous abnormality. No discrete lytic or
blastic osseous lesions.

Review of the MIP images confirms the above findings.
IMPRESSION: 1. No CT evidence for acute or recurrent pulmonary embolism.
Previously seen small filling defects within the right lower lobe
are no longer definitely visualized.
2. Persistent heterogeneous multifocal ground-glass opacity
involving the bilateral lungs, improved as compared to previous,
compatible with improving and/or resolving viral pneumonia.
3. No other acute cardiopulmonary abnormality identified.
4. 3.2 cm right adrenal adenoma.
5. Hepatic steatosis.

## 2019-03-23 MED ORDER — IOHEXOL 350 MG/ML SOLN
100.0000 mL | Freq: Once | INTRAVENOUS | Status: AC | PRN
  Administered 2019-03-24: 100 mL via INTRAVENOUS

## 2019-03-23 MED ORDER — METHYLPREDNISOLONE SODIUM SUCC 125 MG IJ SOLR
125.0000 mg | Freq: Once | INTRAMUSCULAR | Status: AC
  Administered 2019-03-23: 125 mg via INTRAVENOUS
  Filled 2019-03-23: qty 2

## 2019-03-23 MED ORDER — MAGNESIUM SULFATE 2 GM/50ML IV SOLN
2.0000 g | Freq: Once | INTRAVENOUS | Status: AC
  Administered 2019-03-23: 2 g via INTRAVENOUS
  Filled 2019-03-23: qty 50

## 2019-03-23 NOTE — ED Provider Notes (Signed)
[facility] EMERGENCY DEPARTMENT Provider Note   CSN: [id number] Arrival date & time: 03/23/19  2001     History   Chief Complaint Chief Complaint  Patient presents with  . Shortness of Breath    HPI CAROLY PAGEL is a 37 y.o. female.     Patient complains of shortness of breath.  Patient has history of COVID infection and PE.  The history is provided by the patient. No language interpreter was used.  Shortness of Breath Severity:  Moderate Onset quality:  Sudden Timing:  Constant Progression:  Worsening Chronicity:  Recurrent Context: activity   Relieved by:  Nothing Worsened by:  Nothing Ineffective treatments:  None tried Associated symptoms: no abdominal pain, no chest pain, no cough, no headaches and no rash     Past Medical History:  Diagnosis Date  . 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[place])   . Diverticulitis   . Hypertension   . Preeclampsia     Patient Active Problem List   Diagnosis Date Noted  . GERD (gastroesophageal reflux disease) 03/13/2019  . Pulmonary embolism ([place]) 03/13/2019  . Coronavirus infection 03/07/2019  . COVID-19 virus infection 02/11/2019  . Acute respiratory failure with hypoxia ([place]) 02/11/2019  . Hypokalemia 02/11/2019  . Abnormal liver function 02/11/2019  . Pregnant 03/17/2018  . H/O severe pre-eclampsia 03/17/2018  . Previous cesarean section 03/17/2018  . Tachycardia 03/17/2018  . Chronic hypertension during pregnancy, antepartum 03/17/2018  . ADD (attention deficit disorder) 08/22/2017  . Cushings syndrome ([place]) 08/16/2017  . Goiter 07/19/2017  . Morbid obesity ([place]) 07/19/2017  . Diverticulitis of colon 08/09/2014  . Abdominal pain 08/09/2014  . Adrenal adenoma, right 08/09/2014  . Diverticulitis 08/09/2014  . Elevated transaminase level 08/09/2014  . Preeclampsia 10/17/2010  . History of cholecystectomy 12/27/2002    Past Surgical History:  Procedure Laterality Date  . CESAREAN SECTION    . CHOLECYSTECTOMY    . WISDOM  TOOTH EXTRACTION       OB History    Gravida  5   Para  1   Term      Preterm  1   AB  3   Living  1     SAB  1   TAB      Ectopic      Multiple      Live Births  1            Home Medications    Prior to Admission medications   Medication Sig Start Date End Date Taking? Authorizing Provider  albuterol (VENTOLIN HFA) 108 (90 Base) MCG/ACT inhaler Inhale 2 puffs into the lungs every 4 (four) hours as needed for wheezing or shortness of breath. 02/21/19  Yes [place], Modena Nunnery, MD  apixaban (ELIQUIS) 5 MG TABS tablet Take 2 tablets (10 mg total) by mouth 2 (two) times daily for 7 days. 03/09/19 03/23/19 Yes [place], Suann Larry, MD  atenolol (TENORMIN) 25 MG tablet Take 0.5 tablets (12.5 mg total) by mouth daily. 03/13/19  Yes [place], Modena Nunnery, MD  cetirizine (ZYRTEC) 10 MG tablet Take 10 mg by mouth daily.    Yes [provider]  cholecalciferol (VITAMIN D) 1000 units tablet Take 1,000 Units by mouth daily.  12/26/17  Yes [provider]  Cyanocobalamin (B-12) 1000 MCG TABS Take 1 tablet by mouth daily.  12/26/17  Yes [provider]  ondansetron (ZOFRAN) 4 MG tablet Take 1 tablet (4 mg total) by mouth every 8 (eight) hours as needed for nausea or vomiting.  02/09/19  Yes Martin, Mary-Margaret, FNP  pantoprazole (PROTONIX) 40 MG tablet Take 1 tablet (40 mg total) by mouth daily. 03/13/19  Yes [place], Modena Nunnery, MD    Family History Family History  Problem Relation Age of Onset  . Hypertension Father   . Diabetes Father   . Anxiety disorder Mother   . Depression Mother   . Cancer Sister        throat   . Hypertension Paternal Grandmother   . Hypertension Maternal Grandmother   . Asthma Daughter     Social History Social History   Tobacco Use  . Smoking status: Never Smoker  . Smokeless tobacco: Never Used  Substance Use Topics  . Alcohol use: No  . Drug use: No     Allergies   Ciprofloxacin   Review of Systems Review of  Systems  Constitutional: Negative for appetite change and fatigue.  HENT: Negative for congestion, ear discharge and sinus pressure.   Eyes: Negative for discharge.  Respiratory: Positive for shortness of breath. Negative for cough.   Cardiovascular: Negative for chest pain.  Gastrointestinal: Negative for abdominal pain and diarrhea.  Genitourinary: Negative for frequency and hematuria.  Musculoskeletal: Negative for back pain.  Skin: Negative for rash.  Neurological: Negative for seizures and headaches.  Psychiatric/Behavioral: Negative for hallucinations.     Physical Exam Updated Vital Signs BP (!) 177/139 (BP Location: Right Arm)   Pulse 83   Temp 98.8 F (37.1 C) (Oral)   Resp 20   Ht 5\' 4"  (1.626 m)   Wt 116.1 kg   SpO2 100%   BMI 43.94 kg/m   Physical Exam Vitals signs reviewed.  Constitutional:      Appearance: She is well-developed.  HENT:     Head: Normocephalic.     Nose: Nose normal.  Eyes:     General: No scleral icterus.    Conjunctiva/sclera: Conjunctivae normal.  Neck:     Musculoskeletal: Neck supple.     Thyroid: No thyromegaly.  Cardiovascular:     Rate and Rhythm: Normal rate and regular rhythm.     Heart sounds: No murmur. No friction rub. No gallop.   Pulmonary:     Breath sounds: No stridor. No wheezing or rales.  Chest:     Chest wall: No tenderness.  Abdominal:     General: There is no distension.     Tenderness: There is no abdominal tenderness. There is no rebound.  Musculoskeletal: Normal range of motion.  Lymphadenopathy:     Cervical: No cervical adenopathy.  Skin:    Findings: No erythema or rash.  Neurological:     Mental Status: She is oriented to person, place, and time.     Motor: No abnormal muscle tone.     Coordination: Coordination normal.  Psychiatric:        Behavior: Behavior normal.      ED Treatments / Results  Labs (all labs ordered are listed, but only abnormal results are displayed) Labs Reviewed   CBC WITH DIFFERENTIAL/PLATELET - Abnormal; Notable for the following components:      Result Value   WBC 10.6 (*)    Abs Immature Granulocytes 0.11 (*)    All other components within normal limits  COMPREHENSIVE METABOLIC PANEL - Abnormal; Notable for the following components:   CO2 20 (*)    Glucose, Bld 147 (*)    All other components within normal limits  SARS CORONAVIRUS 2 (HOSPITAL ORDER, [place])  TROPONIN I (HIGH SENSITIVITY)  TROPONIN I (HIGH SENSITIVITY)    EKG None  Radiology No results found.  Procedures Procedures (including critical care time)  Medications Ordered in ED Medications  iohexol (OMNIPAQUE) 350 MG/ML injection 100 mL (has no administration in time range)  methylPREDNISolone sodium succinate (SOLU-MEDROL) 125 mg/2 mL injection 125 mg (125 mg Intravenous Given 03/23/19 2137)  magnesium sulfate IVPB 2 g 50 mL (0 g Intravenous Stopped 03/23/19 2239)     Initial Impression / Assessment and Plan / ED Course  I have reviewed the triage vital signs and the nursing notes.  Pertinent labs & imaging results that were available during my care of the patient were reviewed by me and considered in my medical decision making (see chart for details).    WYLODEAN LOZEAU was evaluated in Emergency Department on 03/28/2019 for the symptoms described in the history of present illness. She was evaluated in the context of the global COVID-19 pandemic, which necessitated consideration that the patient might be at risk for infection with the SARS-CoV-2 virus that causes COVID-19. Institutional protocols and algorithms that pertain to the evaluation of patients at risk for COVID-19 are in a state of rapid change based on information released by regulatory bodies including the CDC and federal and state organizations. These policies and algorithms were followed during the patient's care in the ED.      Patient with covid infection and is being  treated for PE. Patient CT angios pending and disposition will be determined by Dr. Dina Rich Final Clinical Impressions(s) / ED Diagnoses   Final diagnoses:  None    ED Discharge Orders    None       Milton Ferguson, MD 03/28/19 1044

## 2019-03-23 NOTE — ED Triage Notes (Signed)
Pt was discharged from [facility] on 03/09/19 for a second time. Pt was initially tested positive for Covid on 02/11/19. This last admission on 9/9 pt was diagnosed with PE and started on Eliquis 2 times daily as directed. Pt reports continued and worsening shortness of breath. Pt also diagnosed with double PNA (viral) on 02/12/19. Pt on continuous 3 L oxygen.

## 2019-03-24 MED ORDER — PREDNISONE 20 MG PO TABS: 40.0000 mg | ORAL_TABLET | Freq: Every day | ORAL | 0 refills | Status: DC

## 2019-03-24 NOTE — Discharge Instructions (Signed)
You were seen today for persistent shortness of breath.  This is likely related to chronic effects from COVID-19.  Your CT scan shows slight improvement and no evidence of pulmonary embolus.  Follow-up with your doctor on Tuesday as scheduled.  You will be given several day course of steroids.

## 2019-03-24 NOTE — ED Provider Notes (Signed)
Patient signed out pending CT scan.  CT scan overall improved but continues to have groundglass opacities concerning for viral pneumonia.  No evidence of PE.  Patient has not been hypoxic.  She has oxygen at home as needed.  She has been ambulatory on her own.  I discussed the results with the patient.  She was given initially Solu-Medrol.  I will discharge her home with a short course of steroids.  I suspect she may have long-term or chronic sequelae of COVID-19.  Physical Exam  BP (!) 158/101 (BP Location: Right Arm)   Pulse 81   Temp 98.8 F (37.1 C) (Oral)   Resp 20   Ht 1.626 m (5\' 4" )   Wt 116.1 kg   SpO2 99%   BMI 43.94 kg/m    Problem List Items Addressed This Visit    None    Visit Diagnoses    Shortness of breath    -  Primary   History of 2019 novel coronavirus disease (COVID-19)               Ellean Firman, Barbette Hair, MD 03/24/19 [phone]

## 2019-03-24 NOTE — ED Notes (Signed)
Pt ambulatory to restroom with no difficulty or distress noted. Once back in patients room O2 sats 98% on 3L [place].

## 2019-03-27 ENCOUNTER — Encounter: Payer: Self-pay | Admitting: Family Medicine

## 2019-03-27 ENCOUNTER — Other Ambulatory Visit: Payer: Self-pay

## 2019-03-27 ENCOUNTER — Ambulatory Visit (INDEPENDENT_AMBULATORY_CARE_PROVIDER_SITE_OTHER): Payer: No Typology Code available for payment source | Admitting: Family Medicine

## 2019-03-27 VITALS — BP 130/78 | HR 116 | Temp 99.1°F | Resp 14 | Ht 64.0 in | Wt 265.0 lb

## 2019-03-27 DIAGNOSIS — I2699 Other pulmonary embolism without acute cor pulmonale: Secondary | ICD-10-CM

## 2019-03-27 DIAGNOSIS — J129 Viral pneumonia, unspecified: Secondary | ICD-10-CM

## 2019-03-27 DIAGNOSIS — U071 COVID-19: Secondary | ICD-10-CM | POA: Diagnosis not present

## 2019-03-27 MED ORDER — DEXAMETHASONE 2 MG PO TABS: ORAL_TABLET | ORAL | 0 refills | Status: DC

## 2019-03-27 NOTE — Patient Instructions (Addendum)
Referral to pulmonary  PLAN TO return to work on Oct 26th  F/U pending consultation

## 2019-03-27 NOTE — Progress Notes (Signed)
Subjective:    Patient ID: Paula King, female    DOB: 10-31-1980, 38 y.o.   MRN: [id number]  Patient presents for Follow-up [facility])  Patient initially here for her 2-week follow-up for COVID  complications.  She currently has pulmonary embolism which is been treated with Eliquis.  She is also on oxygen therapy.  She had to return to the emergency room on Friday secondary to increase shortness of breath.  CT of chest still showed bilateral groundglass opacities consistent with viral pneumonia as a sequela of COVID-19 infection.  She was given Solu-Medrol along with prednisone taper. She still has positive COVID-19 PCR but also has antibodies. intialy COVID positive was in August 16th   [facility] is currently out on medical leave until October 4 but we will need to extend this secondary to her further complications. She continues to have a hacky cough, minimal production, she continues to have episdoes of SOB and hypoxia, so she uses oxygen as needed, not able to really to exert herself without having SOB. She has been using mucinex, and her albuterol as needed but doesn't feel like much relief Prednisone from ER helped a little, but decadron works better for her, requested this  She is also still on treatment for her PE, no hemoptysis, no hematuria, no blood in stool     Review Of Systems:  GEN- denies fatigue, fever, weight loss,weakness, recent illness HEENT- denies eye drainage, change in vision, nasal discharge, CVS- denies chest pain, palpitations RESP- denies SOB, cough, wheeze ABD- denies N/V, change in stools, abd pain GU- denies dysuria, hematuria, dribbling, incontinence MSK- denies joint pain, muscle aches, injury Neuro- denies headache, dizziness, syncope, seizure activity       Objective:    BP 130/78   Pulse (!) 116   Temp 99.1 F (37.3 C) (Oral)   Resp 14   Ht 5\' 4"  (1.626 m)   Wt 265 lb (120.2 kg)   SpO2 97% Comment: RA  BMI 45.49 kg/m  GEN- NAD, alert  and oriented x3 HEENT- PERRL, EOMI, non injected sclera, pink conjunctiva, MMM, oropharynx clear Neck- Supple, no thyromegaly CVS- tachycardia, HR 100 , no murmur RESP-CTAB,occ hacky cough ABD-NABS,soft,NT,ND EXT- No edema Pulses- Radial, DP- 2+        Assessment & Plan:      Problem List Items Addressed This Visit      Unprioritized   History of COVID -19 INFECTION    Her CT scan does show resolved pulmonary embolism but recommend that she do complete the treatment for 3 months.  She also has improving pneumonitis though she continues to have episodes of shortness of breath I think that she may have some long-term sequela from her COVID-19 infection.  I will go ahead and put her on a Decadron taper if she does better with this.  She is using her oxygen as needed.  She can continue the Mucinex I do not think that this is hurting anything.  I think that she needs to be evaluated by pulmonary to see if there is anything more we can do to help her condition.  She is unable to work at this time and I will extend her work note until the end of October after she has been assessed by pulmonary.      Pulmonary embolism ([place])    Other Visit Diagnoses    Viral pneumonitis    -  Primary      Note: This dictation was prepared with Viviann Spare  dictation along with smaller phrase technology. Any transcriptional errors that result from this process are unintentional.

## 2019-03-27 NOTE — Assessment & Plan Note (Signed)
Her CT scan does show resolved pulmonary embolism but recommend that she do complete the treatment for 3 months.  She also has improving pneumonitis though she continues to have episodes of shortness of breath I think that she may have some long-term sequela from her COVID-19 infection.  I will go ahead and put her on a Decadron taper if she does better with this.  She is using her oxygen as needed.  She can continue the Mucinex I do not think that this is hurting anything.  I think that she needs to be evaluated by pulmonary to see if there is anything more we can do to help her condition.  She is unable to work at this time and I will extend her work note until the end of October after she has been assessed by pulmonary.

## 2019-03-30 ENCOUNTER — Ambulatory Visit: Payer: No Typology Code available for payment source | Admitting: "Endocrinology

## 2019-04-03 ENCOUNTER — Other Ambulatory Visit: Payer: Self-pay

## 2019-04-03 ENCOUNTER — Encounter: Payer: Self-pay | Admitting: Pulmonary Disease

## 2019-04-03 ENCOUNTER — Ambulatory Visit (INDEPENDENT_AMBULATORY_CARE_PROVIDER_SITE_OTHER): Payer: No Typology Code available for payment source | Admitting: Pulmonary Disease

## 2019-04-03 ENCOUNTER — Telehealth: Payer: Self-pay | Admitting: Pulmonary Disease

## 2019-04-03 DIAGNOSIS — U071 COVID-19: Secondary | ICD-10-CM | POA: Diagnosis not present

## 2019-04-03 DIAGNOSIS — I2694 Multiple subsegmental pulmonary emboli without acute cor pulmonale: Secondary | ICD-10-CM | POA: Diagnosis not present

## 2019-04-03 MED ORDER — APIXABAN 5 MG PO TABS: 5.0000 mg | ORAL_TABLET | Freq: Two times a day (BID) | ORAL | 0 refills | Status: DC

## 2019-04-03 MED ORDER — APIXABAN 5 MG PO TABS: 10.0000 mg | ORAL_TABLET | Freq: Two times a day (BID) | ORAL | 1 refills | Status: DC

## 2019-04-03 MED FILL — ELIQUIS 5 MG TABLET: 5 | 30 days supply | Qty: 60 | Fill #0

## 2019-04-03 NOTE — Assessment & Plan Note (Signed)
Covid pneumonia appears to be resolving -interstitial infiltrates appear much improved on CT scan in 9/25 on my review. I do expect her to recover fully from this illness Regarding persistent test +2 months out, this is related to viral shedding.  She does not appear to be infectious at this point

## 2019-04-03 NOTE — Telephone Encounter (Signed)
Called and spoke with the pharmacist at [facility]  She states that they received rx that we sent for Eliquis 5 mg 2 tablets bid  She states that it's typically taken 5 mg 1 bid and pt already had rx on file for this and she dispensed this for the pt She wanted to let us know to make sure Dr Elsworth Soho  I have updated [facility]

## 2019-04-03 NOTE — Patient Instructions (Signed)
You are cleared to return to work from a shortness of breath standpoint.   Health at work will provide clearance from [facility].    Refills on Eliquis x 2 -suggest total 6 months of anticoagulation until mid-March  Flu shot when able. Call us as needed

## 2019-04-03 NOTE — Telephone Encounter (Signed)
Agreed-prescription was intended for 5 mg- 1 tablet twice daily Thank you for the correction

## 2019-04-03 NOTE — Progress Notes (Signed)
Subjective:    Patient ID: Paula King, female    DOB: 1981/06/13, 38 y.o.   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  She was hospitalized 8/16 to 8/24 with COVID pneumonia and treated with oxygen, steroids and remdesevir.  By discharge, she was only mildly hypoxic and did not require oxygen. She was readmitted 9/8 to 9/7 for persistent dyspnea and found to have segmental and subsegmental right lower lobe pulmonary emboli.  I personally reviewed the CT scan and this also shows diffuse bilateral interstitial opacities.  She was placed on Eliquis  Due to persistent dyspnea she had another ED visit on 9/25 when CT chest was again obtained, pulmonary emboli had resolved and interstitial changes to my review appears improved. She was again given dexamethasone which she has completed 2 days ago. She reports persistent dyspnea on activity.  Although in the past few days she has felt improved.  Oxygen level is holding up okay and she wonders if she can return to work. She denies wheezing or coughing.  Her child has asthma and she wonders if she is going to develop asthma due to Emanuel. She is a lifetime never smoker. She has Cushing's disease .  She is tolerating apixaban quite well  She was upset on my first entering the room due to isolation precautions taken by staff.  Her last covid pcr test on 9/25 was positive   Past Medical History:  Diagnosis Date  . 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[place])   . Diverticulitis   . Hypertension   . Preeclampsia    Past Surgical History:  Procedure Laterality Date  . CESAREAN SECTION    . CHOLECYSTECTOMY    . WISDOM TOOTH EXTRACTION      Allergies  Allergen Reactions  . Ciprofloxacin     IV Only-Patient started sweating and becoming clammy when was infused with IV cipro on 08/09/14. Can tolerate PO Ciprofloxacin     Social History   Socioeconomic History  .  Marital status: Single    Spouse name: Not on file  . Number of children: 1  . Years of education: Not on file  . Highest education level: Some college, no degree  Occupational History    Employer: [employer]  Social Needs  . Financial resource strain: Not hard at all  . Food insecurity    Worry: Never true    Inability: Never true  . Transportation needs    Medical: No    Non-medical: No  Tobacco Use  . Smoking status: Never Smoker  . Smokeless tobacco: Never Used  Substance and Sexual Activity  . Alcohol use: No  . Drug use: No  . Sexual activity: Yes    Birth control/protection: None  Lifestyle  . Physical activity    Days per week: 2 days    Minutes per session: 30 min  . Stress: Only a little  Relationships  . Social connections    Talks on phone: More than three times a week    Gets together: More than three times a week    Attends religious service: Never    Active member of club or organization: No    Attends meetings of clubs or organizations: Never    Relationship status: Never married  . Intimate partner violence    Fear of current or ex partner: No    Emotionally abused: No    Physically abused: No  Forced sexual activity: No  Other Topics Concern  . Not on file  Social History Narrative   Lives in [place], [place].   Single, not dating.    Has one child, age 65, 24.   Has family in [place], [place].   Enjoys swimming.   Enjoys time with family.    Eats all foods.   Wears seatbelt.      Family History  Problem Relation Age of Onset  . Hypertension Father   . Diabetes Father   . Anxiety disorder Mother   . Depression Mother   . Cancer Sister        throat   . Hypertension Paternal Grandmother   . Hypertension Maternal Grandmother   . Asthma Daughter      Review of Systems   Constitutional: negative for anorexia, fevers and sweats  Eyes: negative for irritation, redness and visual disturbance  Ears, nose, mouth, throat, and face:  negative for earaches, epistaxis, nasal congestion and sore throat  Respiratory: negative for cough, , sputum and wheezing  Cardiovascular: negative for chest pain,lower extremity edema, orthopnea, palpitations and syncope  Gastrointestinal: negative for abdominal pain, constipation, diarrhea, melena, nausea and vomiting  Genitourinary:negative for dysuria, frequency and hematuria  Hematologic/lymphatic: negative for bleeding, easy bruising and lymphadenopathy  Musculoskeletal:negative for arthralgias, muscle weakness and stiff joints  Neurological: negative for coordination problems, gait problems, headaches and weakness  Endocrine: negative for diabetic symptoms including polydipsia, polyuria and weight loss     Objective:   Physical Exam  Gen. Pleasant, obese, in no distress, normal affect ENT - no pallor,icterus, no post nasal drip, class 2-3 airway Neck: No JVD, no thyromegaly, no carotid bruits Lungs: no use of accessory muscles, no dullness to percussion, decreased without rales or rhonchi  Cardiovascular: Rhythm regular, heart sounds  normal, no murmurs or gallops, no peripheral edema Abdomen: soft and non-tender, no hepatosplenomegaly, BS normal. Musculoskeletal: No deformities, no cyanosis or clubbing Neuro:  alert, non focal, no tremors       Assessment & Plan:

## 2019-04-03 NOTE — Assessment & Plan Note (Signed)
This appears to be provoked by covid infection.  Although 3 months of anticoagulation may be enough, due to lack of data regarding hypercoagulability post COVID would suggest continuing for 6 months  We will send in refills and Eliquis today

## 2019-04-05 ENCOUNTER — Telehealth: Payer: Self-pay | Admitting: Pulmonary Disease

## 2019-04-05 NOTE — Telephone Encounter (Signed)
Paula King I don't see what you wanted to discuss with pt. Please advise.

## 2019-04-06 ENCOUNTER — Telehealth: Payer: Self-pay | Admitting: Pulmonary Disease

## 2019-04-06 NOTE — Telephone Encounter (Signed)
Medication name and strength: Eliquis 5mg  Provider: Dr. Elsworth Soho Pharmacy: [facility] out patient pharmacy Patient insurance ID: [id number] Phone: [phone]   Was the PA started on CMM?  Yes If yes, please enter the Key: [place]: [id number] - PA Case ID: [id number] - Rx #: [id number] Need help? Call us at [phone]  Status  Sent to Plan today 04/06/19 Drug Eliquis 5MG  tablets  [employer] ePA Form  Original Claim Info9G Quantity Dispensed Exceeds Maximum Allowed  Message routed to dana, CMA to follow up

## 2019-04-09 NOTE — Telephone Encounter (Signed)
Per covermymeds, it still has status of having been sent to plan. Decision is still pending.

## 2019-04-09 NOTE — Telephone Encounter (Signed)
Called and spoke with patient. Nothing further needed.

## 2019-04-10 NOTE — Telephone Encounter (Signed)
PA is still in process per [email or web address]

## 2019-04-23 NOTE — Telephone Encounter (Signed)
Checked covermymeds and the status showed PA was submitted to plan on 04/06/19.No additional status updates were available.  Was directed to call [employer] [phone]. I called and and rep stated they contacted covermymeds regarding the PA submitted and covermymeds told them to send fax to [phone] on 04/16/19. I advised that is not our fax number and to please resend to [phone]. She stated it will take 1 - 2 hours for the fax to be sent to our office.

## 2019-04-26 MED ORDER — APIXABAN 5 MG PO TABS: 5.0000 mg | ORAL_TABLET | Freq: Two times a day (BID) | ORAL | 2 refills | Status: DC

## 2019-04-26 NOTE — Telephone Encounter (Signed)
[employer] sent a fax stating the requested quantity of 60 tablets per 15 days needs to be verified. The recommended dose for pulmonary embolism is 10mg  for the first 7 days of therapy, then 5mg  twice daily therafter. Is this the requested dose for the patient? If not they we need to provide more information and fax to [phone].   The form was placed in the initiated PA folder.

## 2019-04-26 NOTE — Telephone Encounter (Signed)
Updated the order and sent to outpatient pharmacy.

## 2019-04-26 NOTE — Telephone Encounter (Signed)
She is already on eliquis  - so needs 5mg  bid x 60 tabs with 2 refills

## 2019-04-27 MED FILL — ELIQUIS 5 MG TABLET: 5 | 30 days supply | Qty: 60 | Fill #0

## 2019-04-30 NOTE — Telephone Encounter (Signed)
Thanks

## 2019-05-03 ENCOUNTER — Other Ambulatory Visit: Payer: Self-pay

## 2019-05-03 MED ORDER — DEXAMETHASONE 1 MG PO TABS: 1.0000 mg | ORAL_TABLET | Freq: Once | ORAL | 0 refills | Status: AC

## 2019-05-10 ENCOUNTER — Ambulatory Visit: Payer: No Typology Code available for payment source | Admitting: "Endocrinology

## 2019-05-14 LAB — CORTISOL-AM, BLOOD: Cortisol - AM: 3.4 ug/dL — ABNORMAL LOW

## 2019-05-17 ENCOUNTER — Other Ambulatory Visit: Payer: Self-pay

## 2019-05-17 ENCOUNTER — Ambulatory Visit (INDEPENDENT_AMBULATORY_CARE_PROVIDER_SITE_OTHER): Payer: No Typology Code available for payment source | Admitting: "Endocrinology

## 2019-05-17 ENCOUNTER — Encounter: Payer: Self-pay | Admitting: "Endocrinology

## 2019-05-17 VITALS — BP 135/94 | HR 79 | Ht 64.0 in | Wt 253.0 lb

## 2019-05-17 DIAGNOSIS — D3501 Benign neoplasm of right adrenal gland: Secondary | ICD-10-CM | POA: Diagnosis not present

## 2019-05-17 MED ORDER — SAXENDA 18 MG/3ML ~~LOC~~ SOPN: 1.2000 mg | PEN_INJECTOR | Freq: Every day | SUBCUTANEOUS | 2 refills | Status: DC

## 2019-05-17 NOTE — Progress Notes (Signed)
05/17/2019, 4:43 PM                                Endocrinology Telehealth Visit Follow up Note -During COVID -19 Pandemic  I connected with Paula King on 05/17/2019   by telephone and verified that I am speaking with the correct person using two identifiers. Meta Hatchet Shamoon, [birth year]. she has verbally consented to this visit. All issues noted in this document were discussed and addressed. The format was not optimal for physical exam.   Subjective:    Patient ID: Paula King, female    DOB: 22-Mar-1981, PCP Alycia Rossetti, MD   Past Medical History:  Diagnosis Date  . 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[place])   . Diverticulitis   . Hypertension   . Preeclampsia    Past Surgical History:  Procedure Laterality Date  . CESAREAN SECTION    . CHOLECYSTECTOMY    . WISDOM TOOTH EXTRACTION     Social History   Socioeconomic History  . Marital status: Single    Spouse name: Not on file  . Number of children: 1  . Years of education: Not on file  . Highest education level: Some college, no degree  Occupational History    Employer: [employer]  Social Needs  . Financial resource strain: Not hard at all  . Food insecurity    Worry: Never true    Inability: Never true  . Transportation needs    Medical: No    Non-medical: No  Tobacco Use  . Smoking status: Never Smoker  . Smokeless tobacco: Never Used  Substance and Sexual Activity  . Alcohol use: No  . Drug use: No  . Sexual activity: Yes    Birth control/protection: None  Lifestyle  . Physical activity    Days per week: 2 days    Minutes per session: 30 min  . Stress: Only a little  Relationships  . Social connections    Talks on phone: More than three times a week    Gets together: More than three times a week    Attends religious service: Never    Active member of club or organization: No    Attends meetings of clubs or organizations: Never    Relationship status:  Never married  Other Topics Concern  . Not on file  Social History Narrative   Lives in [place], [place].   Single, not dating.    Has one child, age 75, 70.   Has family in [place], [place].   Enjoys swimming.   Enjoys time with family.    Eats all foods.   Wears seatbelt.    Outpatient Encounter Medications as of 05/17/2019  Medication Sig  . Zinc 50 MG TABS Take by mouth daily.  Marland Kitchen apixaban (ELIQUIS) 5 MG TABS tablet Take 1 tablet (5 mg total) by mouth 2 (two) times daily for 180 doses.  Marland Kitchen atenolol (TENORMIN) 25 MG tablet Take 0.5 tablets (12.5 mg total) by mouth daily. (Patient taking differently: Take 12.5 mg by mouth daily as needed. )  . cetirizine (ZYRTEC) 10 MG tablet Take 10 mg by mouth daily.   . cholecalciferol (VITAMIN D) 1000  units tablet Take 1,000 Units by mouth daily.   . Cyanocobalamin (B-12) 1000 MCG TABS Take 1 tablet by mouth daily.   . Liraglutide -Weight Management (SAXENDA) 18 MG/3ML SOPN Inject 1.2 mg into the skin daily.  . [DISCONTINUED] pantoprazole (PROTONIX) 40 MG tablet Take 1 tablet (40 mg total) by mouth daily.   No facility-administered encounter medications on file as of 05/17/2019.    ALLERGIES: Allergies  Allergen Reactions  . Ciprofloxacin     IV Only-Patient started sweating and becoming clammy when was infused with IV cipro on 08/09/14. Can tolerate PO Ciprofloxacin     VACCINATION STATUS: There is no immunization history for the selected administration types on file for this patient.  HPI Paula King is 38 y.o. female who presents today with a medical history as above. she is being seen in follow up for right adrenal adenoma . - Her history starts in February 2016 when she underwent CT scan of her abdomen due to left lower quadrant pain which incidentally showed 2.7 cm mass  on her right adrenal gland which was reported to be consistent with adenoma. - Repeat CT abdomen in November 2018 revealed that the adenoma persisted and measured 3  cm ( HU 0) still favoring adenoma. -More recently on March 24, 2019, she underwent CT angiogram related to work-up in the hospital when she was admitted for COVID-19, the right adrenal adenoma was found to be measuring 3.2cm. -She was exposed to large amounts of steroids during the course of her illness including dexamethasone. -Previously ,she underwent hormonal work-up to determine functional status of this adenoma.  Findings have not been conclusive, except for one instance of 24-hour urine free cortisol at 59. -Her subsequent functional studies were within normal limits, did not require definitive intervention and was put on observation with plan to repeat plasma free metanephrines, A1c, and thyroid function tests.  These are all within normal limits.  -Before this visit, she underwent 1 mg overnight dexamethasone suppression test with the next morning serum cortisol being 3.4 ng per DL. -Despite her exposure to high-dose steroids, she has lost 12 pounds since last visit.  -She still complains of fatigue, likely related to her sleep apnea.  - She has history of prediabetes, hypertension which required 1 medication with atenolol 25 mg by mouth daily.  - She has 1 child, 24 years of age. - She reports that she has tried to lose weight by increasing her exercise and dietary modifications. -She is interested to explore other options of weight management, however not ready for bariatric surgery. - She denies diaphoresis, uncontrolled hypertension, headaches.  Review of Systems  Constitutional: + fluctuating weight , + fatigue, no subjective hyperthermia, no subjective hypothermia Eyes: no blurry vision, no xerophthalmia ENT: no sore throat, no nodules palpated in throat, no dysphagia/odynophagia, no hoarseness Cardiovascular: no Chest Pain, no Shortness of Breath, no palpitations, no leg swelling Respiratory: no cough, no SOB Gastrointestinal: no Nausea/Vomiting/Diarhhea Musculoskeletal:  no muscle/joint aches Skin: no rashes Neurological: no tremors, no numbness, no tingling, no dizziness Psychiatric: no depression, no anxiety   Objective:    BP (!) 135/94   Pulse 79   Ht 5\' 4"  (1.626 m)   Wt 253 lb (114.8 kg)   BMI 43.43 kg/m   Wt Readings from Last 3 Encounters:  05/17/19 253 lb (114.8 kg)  04/03/19 257 lb 6.4 oz (116.8 kg)  03/27/19 265 lb (120.2 kg)    Physical Exam   Physical Exam- Limited  Constitutional:  Body mass index is 43.43 kg/m. , not in acute distress, normal state of mind Eyes:  EOMI, no exophthalmos Neck: Supple Respiratory: Adequate breathing efforts Musculoskeletal: no gross deformities, strength intact in all four extremities, no gross restriction of joint movements Skin:  no rashes, no hyperemia, + non purplish striae on abdominal skin. Neurological: no tremor with outstretched hands.   CMP ( most recent) CMP     Component Value Date/Time   NA 139 03/23/2019 2120   K 3.6 03/23/2019 2120   CL 107 03/23/2019 2120   CO2 20 (L) 03/23/2019 2120   GLUCOSE 147 (H) 03/23/2019 2120   BUN 12 03/23/2019 2120   CREATININE 0.84 03/23/2019 2120   CREATININE 1.11 (H) 03/13/2019 1631   CALCIUM 9.3 03/23/2019 2120   PROT 7.0 03/23/2019 2120   ALBUMIN 3.9 03/23/2019 2120   AST 25 03/23/2019 2120   ALT 43 03/23/2019 2120   ALKPHOS 48 03/23/2019 2120   BILITOT 0.6 03/23/2019 2120   GFRNONAA >60 03/23/2019 2120   GFRNONAA 64 04/18/2017 1041   GFRAA >60 03/23/2019 2120   GFRAA 74 04/18/2017 1041     Diabetic Labs (most recent): Lab Results  Component Value Date   HGBA1C 5.9 (H) 02/16/2019   HGBA1C 5.6 12/13/2018   HGBA1C 5.4 11/18/2017     Lipid Panel ( most recent) Lipid Panel     Component Value Date/Time   CHOL 147 05/24/2018 1133   TRIG 134 03/06/2019 2132   HDL 43 (L) 05/24/2018 1133   CHOLHDL 3.4 05/24/2018 1133   LDLCALC 84 05/24/2018 1133       Lab Results  Component Value Date   TSH 2.620 12/13/2018   TSH  1.83 04/18/2017   FREET4 1.06 12/13/2018    Results for JOLIE, BRANDOW (MRN [id number]) as of 05/17/2019 17:11  Ref. Range 08/23/2017 09:04 12/13/2018 10:47 05/14/2019 07:57  ACTH Latest Ref Range: 7.2 - 63.3 pg/mL 11.9    Cortisol - AM Overnight Dex Supp Latest Units: mcg/dL 6.8  3.4 (L)  Metanephrine, Pl Latest Ref Range: 0.0 - 88.0 pg/mL  <10.0   Normetanephrine, Pl Latest Ref Range: 0.0 - 110.1 pg/mL  42.9     Most recent 24-hour urine free cortisol on November 28, 2017 was 14 improving from 59.  05/10/2017   CT abdomen  Radiology description of her right adrenal mass: Adrenal: Previously noted right adrenal mass is similar to the prior examination measuring 3.0 x 2.6 cm and is diffusely low-attenuation (0 HU), compatible with an adenoma. Left adrenal gland and bilateral kidneys are normal in appearance. No hydroureteronephrosis in the visualized portions of the abdomen.   March 24, 2019, 3.2 cm right adrenal adenoma redemonstrated.   Assessment & Plan:   1. Adrenal adenoma, right  -3.2cm slowly growing stable adenoma.  Radiologic features favoring benign adenoma. - a series of functional studies have been normal.  Patient has difficulty losing weight, see below.  Her most recent overnight dexamethasone suppression test did not exclude Cushing syndrome. -She may benefit from surgical excision of this adenoma at one point.  This would be best done while doing her bariatric surgery laparoscopically, needs coordination with bariatric and endocrine surgeries.  At this time however she is not considering surgery.  She will be put on observation, with plan to repeat 24-hour urine free cortisol.    2) morbid obesity -This is likely unrelated to her adrenal adenoma.  This is her major health concern and she wishes to address it.  She does not have diabetes/prediabetes at this time.    - she  admits there is a room for improvement in her diet and drink choices. -  Suggestion is made  for her to avoid simple carbohydrates  from her diet including Cakes, Sweet Desserts / Pastries, Ice Cream, Soda (diet and regular), Sweet Tea, Candies, Chips, Cookies, Sweet Pastries,  Store Bought Juices, Alcohol in Excess of  1-2 drinks a day, Artificial Sweeteners, Coffee Creamer, and "Sugar-free" Products. This will help patient to have stable blood glucose profile and potentially avoid unintended weight gain. -She has used Saxenda in the past, and would like to explore that treatment again.  I discussed initiated Saxenda 1.2 mg subcutaneously daily with plan to advance by 0.6 mg every third day to a maximum of 3 mg daily.   3. Goiter - She does not have family history of thyroid cancer.  She has significant thyromegaly with no discrete nodules, euthyroid based on her thyroid function tests, no intervention at this time.     - I advised patient to maintain close follow up with Alycia Rossetti, MD for primary care needs.   - Patient Care Time Today:  25 min, of which >50% was spent in  counseling and the rest reviewing her  current and  previous labs/studies, previous treatments, and medications' doses and developing a plan for long-term care based on the latest recommendations for standards of care.   Meta Hatchet Sereno participated in the discussions, expressed understanding, and voiced agreement with the above plans.  All questions were answered to her satisfaction. she is encouraged to contact clinic should she have any questions or concerns prior to her return visit.   Follow up plan: Return in about 3 months (around 08/17/2019) for 24 Hour Urine Free Cortisol and Creatinine.   Glade Lloyd, MD [facility] [address] [place], [place] [address] Phone: [phone]  Fax: [phone]     05/17/2019, 4:43 PM  This note was partially dictated with voice recognition software. Similar sounding words can be transcribed inadequately  or may not  be corrected upon review.

## 2019-05-21 ENCOUNTER — Telehealth: Payer: Self-pay | Admitting: "Endocrinology

## 2019-05-21 NOTE — Telephone Encounter (Signed)
Is she considering the bariatric surgery or adrenal surgery? If it is adrenal , we need wait for the next 24 hour urine studies. If she is considering Bariatric , even if she starts the process now, unlikely for them to operate in 3 months, it usually needs 4-6 months of prep.

## 2019-05-21 NOTE — Telephone Encounter (Signed)
Pt said her insurance is ending at the end of the year and wants to do her surgery asap and not in 3 months

## 2019-05-22 NOTE — Telephone Encounter (Signed)
Pt notified that this more than likely will not happen before the end of the year. She is going to go ahead and do the 24 hr urine studies.  Also notified pt that her Kirke Shaggy which was prescribed at her last visit needed a PA and that is in progress.

## 2019-05-22 NOTE — Telephone Encounter (Signed)
Left message for pt to call back  °

## 2019-06-01 ENCOUNTER — Encounter: Payer: No Typology Code available for payment source | Admitting: Family Medicine

## 2019-06-04 ENCOUNTER — Other Ambulatory Visit: Payer: Self-pay | Admitting: Family Medicine

## 2019-06-04 MED FILL — UNIFINE PENTIPS 8MM 31G: 31G X 8 MM | 90 days supply | Qty: 100 | Fill #0

## 2019-06-04 NOTE — Telephone Encounter (Signed)
Requested medication (s) are due for refill today: no  Requested medication (s) are on the active medication list: no  Last refill:  08/31/2017  Future visit scheduled: yes  Notes to clinic: order discontinued  Review for refill   Requested Prescriptions  Pending Prescriptions Disp Refills   UNIFINE PENTIPS 31G X 8 MM [place] [Pharmacy Med Name: UNIFINE PENTIPS 8MM 31G 31G X 8 MM MISC] 200 each 2    Sig: USE AS DIRECTED     There is no refill protocol information for this order

## 2019-06-05 ENCOUNTER — Encounter: Payer: No Typology Code available for payment source | Admitting: Family Medicine

## 2019-06-07 ENCOUNTER — Other Ambulatory Visit: Payer: Self-pay | Admitting: "Endocrinology

## 2019-06-07 LAB — CORTISOL, URINE, 24 HOUR
24 Hour urine volume (VMAHVA): 1000 mL
Cortisol (Ur), Free: 20.4 mcg/24 h (ref 4.0–50.0)
RESULTS RECEIVED: 1.62 g/(24.h) (ref 0.50–2.15)

## 2019-06-07 MED ORDER — SAXENDA 18 MG/3ML ~~LOC~~ SOPN: 3.0000 mg | PEN_INJECTOR | Freq: Every day | SUBCUTANEOUS | 2 refills | Status: DC

## 2019-06-07 MED FILL — SAXENDA 18 MG/3 ML PEN: 18 | 30 days supply | Qty: 15 | Fill #0

## 2019-06-18 ENCOUNTER — Telehealth: Payer: Self-pay | Admitting: "Endocrinology

## 2019-06-18 ENCOUNTER — Other Ambulatory Visit: Payer: Self-pay | Admitting: "Endocrinology

## 2019-06-18 NOTE — Telephone Encounter (Signed)
I put in a referral for bariatric surgery

## 2019-06-18 NOTE — Telephone Encounter (Signed)
[facility] called and said that the patient advised them we were suppose to send a referral over to them for her to have Baritaric Surgery. Please Advise. Fax number is [phone] and phone number is [phone]

## 2019-06-19 NOTE — Telephone Encounter (Signed)
Order placed by Dr Dorris Fetch. Referral faxed over to [facility].

## 2019-07-11 ENCOUNTER — Other Ambulatory Visit: Payer: Self-pay | Admitting: Otolaryngology

## 2019-07-12 ENCOUNTER — Encounter: Payer: Self-pay | Admitting: Family Medicine

## 2019-07-18 ENCOUNTER — Encounter: Payer: Self-pay | Admitting: Family Medicine

## 2019-07-18 ENCOUNTER — Other Ambulatory Visit: Payer: Self-pay

## 2019-07-18 ENCOUNTER — Ambulatory Visit (INDEPENDENT_AMBULATORY_CARE_PROVIDER_SITE_OTHER): Payer: No Typology Code available for payment source | Admitting: Family Medicine

## 2019-07-18 VITALS — BP 136/82 | HR 96 | Temp 98.1°F | Resp 14 | Ht 64.0 in | Wt 255.0 lb

## 2019-07-18 DIAGNOSIS — D3501 Benign neoplasm of right adrenal gland: Secondary | ICD-10-CM | POA: Diagnosis not present

## 2019-07-18 DIAGNOSIS — E7439 Other disorders of intestinal carbohydrate absorption: Secondary | ICD-10-CM | POA: Diagnosis not present

## 2019-07-18 DIAGNOSIS — Z8616 Personal history of COVID-19: Secondary | ICD-10-CM

## 2019-07-18 DIAGNOSIS — E049 Nontoxic goiter, unspecified: Secondary | ICD-10-CM

## 2019-07-18 MED ORDER — ALBUTEROL SULFATE HFA 108 (90 BASE) MCG/ACT IN AERS: 2.0000 | INHALATION_SPRAY | Freq: Four times a day (QID) | RESPIRATORY_TRACT | 1 refills | Status: DC | PRN

## 2019-07-18 MED ORDER — NALTREXONE-BUPROPION HCL ER 8-90 MG PO TB12: ORAL_TABLET | ORAL | 1 refills | Status: DC

## 2019-07-18 NOTE — Assessment & Plan Note (Signed)
Status post COVID-19 infection but still has some chronic fatigue associated.  No longer on blood thinners that she completed her course for the pulmonary embolism.  She is on the fence about getting Covid vaccine.  She wants to see if she still has antibody immunity.  I will obtain a immunoassay today.

## 2019-07-18 NOTE — Assessment & Plan Note (Addendum)
Class III obesity.  She would benefit from weight loss surgery.  Unfortunately she does have the hormonal issues that are going against her.  She also has early glucose intolerance.  I am going to recheck an A1c.  I think after she has her septoplasty she can try Contrave that this is covered by her insurance.  She cannot be on any narcotics for at least 2 weeks before starting this medication.   She has failed low-carb diet as well as sustained weight loss after use of phentermine or Saxenda

## 2019-07-18 NOTE — Assessment & Plan Note (Signed)
Thyroid function studies have been at goal this is just being monitored.  Her last ultrasound was done in 2019.

## 2019-07-18 NOTE — Progress Notes (Signed)
Subjective:    Patient ID: Paula King, female    DOB: 04-19-1981, 39 y.o.   MRN: [id number]  Patient presents for Surgical Clearance  Patient here to follow-up chronic medical problems.  She had a very prolonged course with [id number] and complications including pulmonary embolism. She was seen by pulmonary. She has stopped eliquis after 3 months of treatment   but has her albuterol prn She does notice significant fatigue at the end of her 12 hour shifts   Also followed by endocrinology secondary to conditions and adrenal adenoma.  After discussion with endocrinologist they would like for her to think about possible bariatric weight loss surgery in order to help her weight and her underlying Cushings syndrome/adrenal adenoma disease. She was started back on Saxenda by endocrinology for weight management but had to discontinue after 2 weeks  Due to GI upset. She had been on in the past as well as phentermine (unable to use secondary to her tachycardia) She did have consultation with general surgery this morning.  She has been referred to one of the weight loss surgeons.  Unfortunately the size of her adrenal adenoma with indeterminate labs does not meet criteria for surgical excision at this time.  They recommend that she have her labs repeated next month which is already scheduled if they are abnormal at that time then they would recommend removal of the adenoma otherwise it would need to be greater than 4 cm before removal.  She is scheduled for nasal septoplasty with Dr. Benjamine Mola  at the end of January she has a spur that also needs to be fixed at the same time.  Chronic tachycardia she is still on atenolol   ADD she is no longer on Adderall or any stimulant medication.  Thyromegaly  her last thyroid function studies were normal.  She does have family history of thyroid cancer   Review Of Systems:  GEN- denies fatigue, fever, weight loss,weakness, recent illness HEENT- denies eye  drainage, change in vision, nasal discharge, CVS- denies chest pain, palpitations RESP- denies SOB, cough, wheeze ABD- denies N/V, change in stools, abd pain GU- denies dysuria, hematuria, dribbling, incontinence MSK- denies joint pain, muscle aches, injury Neuro- denies headache, dizziness, syncope, seizure activity       Objective:    BP 136/82   Pulse 96   Temp 98.1 F (36.7 C) (Temporal)   Resp 14   Ht 5\' 4"  (1.626 m)   Wt 255 lb (115.7 kg)   SpO2 95%   BMI 43.77 kg/m  GEN- NAD, alert and oriented x3 HEENT- PERRL, EOMI, non injected sclera, pink conjunctiva, Neck- Supple, + thyromegaly CVS- RRR, no murmur RESP-CTAB EXT- No edema Pulses- Radial, DP- 2+        Assessment & Plan:      Problem List Items Addressed This Visit      Unprioritized   Adrenal adenoma, right    Reviewed endocrinology note.  As well as the surgeon's note.  She will have labs done next month with endocrinology to see if she meets criteria to have this removed.      Goiter - Primary    Thyroid function studies have been at goal this is just being monitored.  Her last ultrasound was done in 2019.      Relevant Orders   CBC with Differential/Platelet   Comprehensive metabolic panel   History of COVID-19    Status post COVID-19 infection but still has some chronic fatigue associated.  No  longer on blood thinners that she completed her course for the pulmonary embolism.  She is on the fence about getting Covid vaccine.  She wants to see if she still has antibody immunity.  I will obtain a immunoassay today.      Relevant Orders   SARS CoV2 Serology(COVID19) AB(IgG,IgM),Immunoassay   CBC with Differential/Platelet   Comprehensive metabolic panel   Morbid obesity ([place])    Class III obesity.  She would benefit from weight loss surgery.  Unfortunately she does have the hormonal issues that are going against her.  She also has early glucose intolerance.  I am going to recheck an A1c.  I  think after she has her septoplasty she can try Contrave that this is covered by her insurance.  She cannot be on any narcotics for at least 2 weeks before starting this medication.   She has failed low-carb diet as well as sustained weight loss after use of phentermine or Saxenda      Relevant Medications   Naltrexone-buPROPion HCl ER 8-90 MG TB12    Other Visit Diagnoses    Glucose intolerance       Relevant Orders   Hemoglobin A1c      Note: This dictation was prepared with Dragon dictation along with smaller phrase technology. Any transcriptional errors that result from this process are unintentional.

## 2019-07-18 NOTE — Assessment & Plan Note (Signed)
Reviewed endocrinology note.  As well as the surgeon's note.  She will have labs done next month with endocrinology to see if she meets criteria to have this removed.

## 2019-07-18 NOTE — Patient Instructions (Signed)
F/u as previous 

## 2019-07-19 ENCOUNTER — Telehealth: Payer: Self-pay | Admitting: *Deleted

## 2019-07-19 ENCOUNTER — Encounter: Payer: Self-pay | Admitting: Family Medicine

## 2019-07-19 LAB — SARS COV-2 SEROLOGY(COVID-19)AB(IGG,IGM),IMMUNOASSAY
SARS CoV-2 AB IgG: POSITIVE — AB
SARS CoV-2 IgM: NEGATIVE

## 2019-07-19 LAB — CBC WITH DIFFERENTIAL/PLATELET
Absolute Monocytes: 647 cells/uL (ref 200–950)
Basophils Absolute: 88 cells/uL (ref 0–200)
Basophils Relative: 0.9 %
Eosinophils Absolute: 363 cells/uL (ref 15–500)
Eosinophils Relative: 3.7 %
HCT: 46.4 % — ABNORMAL HIGH (ref 35.0–45.0)
Hemoglobin: 16.2 g/dL — ABNORMAL HIGH (ref 11.7–15.5)
Lymphs Abs: 2685 cells/uL (ref 850–3900)
MCH: 31.4 pg (ref 27.0–33.0)
MCHC: 34.9 g/dL (ref 32.0–36.0)
MCV: 89.9 fL (ref 80.0–100.0)
MPV: 9.3 fL (ref 7.5–12.5)
Monocytes Relative: 6.6 %
Neutro Abs: 6017 cells/uL (ref 1500–7800)
Neutrophils Relative %: 61.4 %
Platelets: 365 10*3/uL (ref 140–400)
RBC: 5.16 10*6/uL — ABNORMAL HIGH (ref 3.80–5.10)
RDW: 12.3 % (ref 11.0–15.0)
Total Lymphocyte: 27.4 %
WBC: 9.8 10*3/uL (ref 3.8–10.8)

## 2019-07-19 LAB — COMPREHENSIVE METABOLIC PANEL
AG Ratio: 1.7 (calc) (ref 1.0–2.5)
ALT: 23 U/L (ref 6–29)
AST: 19 U/L (ref 10–30)
Albumin: 4.4 g/dL (ref 3.6–5.1)
Alkaline phosphatase (APISO): 64 U/L (ref 31–125)
BUN: 15 mg/dL (ref 7–25)
CO2: 27 mmol/L (ref 20–32)
Calcium: 10.4 mg/dL — ABNORMAL HIGH (ref 8.6–10.2)
Chloride: 104 mmol/L (ref 98–110)
Creat: 0.98 mg/dL (ref 0.50–1.10)
Globulin: 2.6 g/dL (calc) (ref 1.9–3.7)
Glucose, Bld: 138 mg/dL — ABNORMAL HIGH (ref 65–99)
Potassium: 4 mmol/L (ref 3.5–5.3)
Sodium: 141 mmol/L (ref 135–146)
Total Bilirubin: 0.3 mg/dL (ref 0.2–1.2)
Total Protein: 7 g/dL (ref 6.1–8.1)

## 2019-07-19 LAB — HEMOGLOBIN A1C
Hgb A1c MFr Bld: 5.4 % of total Hgb (ref <5.7)
Mean Plasma Glucose: 108 (calc)
eAG (mmol/L): 6 (calc)

## 2019-07-19 NOTE — Telephone Encounter (Signed)
Received request from pharmacy for PA on Contrave.   PA submitted.   Dx: E66.09- obesity.

## 2019-07-19 NOTE — Telephone Encounter (Signed)
[employer] is reviewing your PA request. You may close this dialog, return to your dashboard, and perform other tasks.  To check for an update later, open this request again from your dashboard. If [employer] has not replied within 24 hours for urgent requests or within 48 hours for standard requests, please contact [employer] at [phone].

## 2019-07-20 ENCOUNTER — Other Ambulatory Visit: Payer: Self-pay

## 2019-07-20 ENCOUNTER — Encounter (HOSPITAL_BASED_OUTPATIENT_CLINIC_OR_DEPARTMENT_OTHER): Payer: Self-pay | Admitting: Otolaryngology

## 2019-07-23 ENCOUNTER — Other Ambulatory Visit: Payer: No Typology Code available for payment source

## 2019-07-23 NOTE — Progress Notes (Signed)
Pt's past covid history reviewed with IP. Pt will need to retest prior to this surgery on 07/24/19. If Negative swab, will proceed with surgery as planned. If positive, will need to postpone surgery and  pt will need to quarantine for 10 days, and then no retest will be needed at that point. Pt called and aware that she will need to go for testing tomorrow.

## 2019-07-24 ENCOUNTER — Other Ambulatory Visit (HOSPITAL_COMMUNITY)
Admission: RE | Admit: 2019-07-24 | Discharge: 2019-07-24 | Disposition: A | Discharge status: Home / Self Care | Payer: No Typology Code available for payment source | Source: Ambulatory Visit | Attending: Otolaryngology | Admitting: Otolaryngology

## 2019-07-24 ENCOUNTER — Other Ambulatory Visit: Payer: Self-pay

## 2019-07-24 ENCOUNTER — Other Ambulatory Visit (HOSPITAL_COMMUNITY): Payer: No Typology Code available for payment source

## 2019-07-24 DIAGNOSIS — Z01812 Encounter for preprocedural laboratory examination: Secondary | ICD-10-CM | POA: Diagnosis present

## 2019-07-24 DIAGNOSIS — Z20822 Contact with and (suspected) exposure to covid-19: Secondary | ICD-10-CM | POA: Diagnosis not present

## 2019-07-24 LAB — SARS CORONAVIRUS 2 (TAT 6-24 HRS): SARS Coronavirus 2: NEGATIVE

## 2019-07-24 MED FILL — CONTRAVE ER 8-90 MG TABLET: 8-90 | 30 days supply | Qty: 70 | Fill #0

## 2019-07-24 NOTE — Telephone Encounter (Signed)
Received PA determination.   PA [id number] approved from 07/18/2019- 11/14/2019.  Renewal for Contrave requires the patient has lost at least 5% of baseline body weight after 3 months of treatment at the maintenance dose.  Pharmacy made aware.

## 2019-07-26 NOTE — Anesthesia Preprocedure Evaluation (Addendum)
Anesthesia Evaluation  Patient identified by MRN, date of birth, ID band Patient awake    Reviewed: Allergy & Precautions, NPO status , Patient's Chart, lab work & pertinent test results  Airway Mallampati: III  TM Distance: >3 FB Neck ROM: Full    Dental no notable dental hx. (+) Teeth Intact, Dental Advisory Given   Pulmonary  S?P covid 19 8/20 Had a PE at tha time Currently assymptomatic   Pulmonary exam normal breath sounds clear to auscultation       Cardiovascular hypertension, Pt. on medications Normal cardiovascular exam Rhythm:Regular Rate:Normal     Neuro/Psych negative neurological ROS     GI/Hepatic Neg liver ROS, GERD  ,  Endo/Other  Morbid obesityHx of Cushings Dz  Renal/GU K+ 4.0     Musculoskeletal   Abdominal (+) + obese,   Peds  Hematology Hg 16.2   Anesthesia Other Findings   Reproductive/Obstetrics                            Anesthesia Physical Anesthesia Plan  ASA: III  Anesthesia Plan: General   Post-op Pain Management:    Induction: Intravenous  PONV Risk Score and Plan: 4 or greater and Treatment may vary due to age or medical condition, Ondansetron, Dexamethasone and Midazolam  Airway Management Planned: Oral ETT  Additional Equipment: None  Intra-op Plan:   Post-operative Plan: Extubation in OR  Informed Consent: I have reviewed the patients History and Physical, chart, labs and discussed the procedure including the risks, benefits and alternatives for the proposed anesthesia with the patient or authorized representative who has indicated his/her understanding and acceptance.     Dental advisory given  Plan Discussed with: CRNA  Anesthesia Plan Comments:        Anesthesia Quick Evaluation

## 2019-07-27 ENCOUNTER — Ambulatory Visit (HOSPITAL_BASED_OUTPATIENT_CLINIC_OR_DEPARTMENT_OTHER)
Admission: RE | Admit: 2019-07-27 | Discharge: 2019-07-27 | Disposition: A | Discharge status: Home / Self Care | Payer: No Typology Code available for payment source | Attending: Otolaryngology | Admitting: Otolaryngology

## 2019-07-27 ENCOUNTER — Encounter (HOSPITAL_BASED_OUTPATIENT_CLINIC_OR_DEPARTMENT_OTHER): Payer: Self-pay | Admitting: Otolaryngology

## 2019-07-27 ENCOUNTER — Ambulatory Visit (HOSPITAL_BASED_OUTPATIENT_CLINIC_OR_DEPARTMENT_OTHER): Payer: No Typology Code available for payment source | Admitting: Certified Registered"

## 2019-07-27 ENCOUNTER — Other Ambulatory Visit: Payer: Self-pay

## 2019-07-27 ENCOUNTER — Encounter (HOSPITAL_BASED_OUTPATIENT_CLINIC_OR_DEPARTMENT_OTHER)
Admission: RE | Disposition: A | Discharge status: Home / Self Care | Payer: Self-pay | Source: Home / Self Care | Attending: Otolaryngology

## 2019-07-27 DIAGNOSIS — Z8616 Personal history of COVID-19: Secondary | ICD-10-CM | POA: Diagnosis not present

## 2019-07-27 DIAGNOSIS — Z86711 Personal history of pulmonary embolism: Secondary | ICD-10-CM | POA: Diagnosis not present

## 2019-07-27 DIAGNOSIS — J342 Deviated nasal septum: Secondary | ICD-10-CM | POA: Insufficient documentation

## 2019-07-27 DIAGNOSIS — H6982 Other specified disorders of Eustachian tube, left ear: Secondary | ICD-10-CM | POA: Diagnosis not present

## 2019-07-27 DIAGNOSIS — J343 Hypertrophy of nasal turbinates: Secondary | ICD-10-CM | POA: Insufficient documentation

## 2019-07-27 DIAGNOSIS — Z9629 Presence of other otological and audiological implants: Secondary | ICD-10-CM | POA: Insufficient documentation

## 2019-07-27 DIAGNOSIS — E249 Cushing's syndrome, unspecified: Secondary | ICD-10-CM | POA: Insufficient documentation

## 2019-07-27 DIAGNOSIS — I10 Essential (primary) hypertension: Secondary | ICD-10-CM | POA: Insufficient documentation

## 2019-07-27 DIAGNOSIS — Z6841 Body Mass Index (BMI) 40.0 and over, adult: Secondary | ICD-10-CM | POA: Insufficient documentation

## 2019-07-27 DIAGNOSIS — J3489 Other specified disorders of nose and nasal sinuses: Secondary | ICD-10-CM | POA: Insufficient documentation

## 2019-07-27 HISTORY — PX: NASAL SEPTOPLASTY W/ TURBINOPLASTY: SHX2070

## 2019-07-27 HISTORY — DX: Other specified abnormal immunological findings in serum: R76.8

## 2019-07-27 LAB — POCT PREGNANCY, URINE: Preg Test, Ur: NEGATIVE

## 2019-07-27 SURGERY — SEPTOPLASTY, NOSE, WITH NASAL TURBINATE REDUCTION
Anesthesia: General | Site: Nose

## 2019-07-27 MED ORDER — MIDAZOLAM HCL 2 MG/2ML IJ SOLN
INTRAMUSCULAR | Status: AC
  Filled 2019-07-27: qty 2

## 2019-07-27 MED ORDER — FENTANYL CITRATE (PF) 100 MCG/2ML IJ SOLN: 25.0000 ug | INTRAMUSCULAR | Status: DC | PRN

## 2019-07-27 MED ORDER — LACTATED RINGERS IV SOLN: INTRAVENOUS | Status: DC

## 2019-07-27 MED ORDER — LIDOCAINE 2% (20 MG/ML) 5 ML SYRINGE
INTRAMUSCULAR | Status: DC | PRN
  Administered 2019-07-27: 100 mg via INTRAVENOUS

## 2019-07-27 MED ORDER — DEXMEDETOMIDINE HCL IN NACL 200 MCG/50ML IV SOLN
INTRAVENOUS | Status: DC | PRN
  Administered 2019-07-27: 8 ug via INTRAVENOUS

## 2019-07-27 MED ORDER — DEXAMETHASONE SODIUM PHOSPHATE 10 MG/ML IJ SOLN
INTRAMUSCULAR | Status: AC
  Filled 2019-07-27: qty 1

## 2019-07-27 MED ORDER — CEFAZOLIN SODIUM 1 G IJ SOLR
INTRAMUSCULAR | Status: AC
  Filled 2019-07-27: qty 20

## 2019-07-27 MED ORDER — DEXAMETHASONE SODIUM PHOSPHATE 10 MG/ML IJ SOLN
INTRAMUSCULAR | Status: DC | PRN
  Administered 2019-07-27: 10 mg via INTRAVENOUS

## 2019-07-27 MED ORDER — ESMOLOL HCL 100 MG/10ML IV SOLN
INTRAVENOUS | Status: AC
  Filled 2019-07-27: qty 10

## 2019-07-27 MED ORDER — PROPOFOL 10 MG/ML IV BOLUS
INTRAVENOUS | Status: DC | PRN
  Administered 2019-07-27: 200 mg via INTRAVENOUS

## 2019-07-27 MED ORDER — FENTANYL CITRATE (PF) 100 MCG/2ML IJ SOLN
INTRAMUSCULAR | Status: DC | PRN
  Administered 2019-07-27: 50 ug via INTRAVENOUS
  Administered 2019-07-27: 100 ug via INTRAVENOUS
  Administered 2019-07-27: 50 ug via INTRAVENOUS

## 2019-07-27 MED ORDER — AMOXICILLIN 875 MG PO TABS: 875.0000 mg | ORAL_TABLET | Freq: Two times a day (BID) | ORAL | 0 refills | Status: AC

## 2019-07-27 MED ORDER — SUGAMMADEX SODIUM 200 MG/2ML IV SOLN
INTRAVENOUS | Status: DC | PRN
  Administered 2019-07-27: 250 mg via INTRAVENOUS

## 2019-07-27 MED ORDER — ROCURONIUM BROMIDE 50 MG/5ML IV SOSY
PREFILLED_SYRINGE | INTRAVENOUS | Status: DC | PRN
  Administered 2019-07-27: 10 mg via INTRAVENOUS
  Administered 2019-07-27: 30 mg via INTRAVENOUS

## 2019-07-27 MED ORDER — CEFAZOLIN SODIUM-DEXTROSE 2-3 GM-%(50ML) IV SOLR
INTRAVENOUS | Status: DC | PRN
  Administered 2019-07-27: 2 g via INTRAVENOUS

## 2019-07-27 MED ORDER — LIDOCAINE-EPINEPHRINE 1 %-1:100000 IJ SOLN
INTRAMUSCULAR | Status: DC | PRN
  Administered 2019-07-27: 3 mL

## 2019-07-27 MED ORDER — PROPOFOL 500 MG/50ML IV EMUL
INTRAVENOUS | Status: AC
  Filled 2019-07-27: qty 50

## 2019-07-27 MED ORDER — OXYMETAZOLINE HCL 0.05 % NA SOLN
NASAL | Status: AC
  Filled 2019-07-27: qty 30

## 2019-07-27 MED ORDER — OXYCODONE-ACETAMINOPHEN 5-325 MG PO TABS: 1.0000 | ORAL_TABLET | ORAL | 0 refills | Status: AC | PRN

## 2019-07-27 MED ORDER — MUPIROCIN 2 % EX OINT
TOPICAL_OINTMENT | CUTANEOUS | Status: DC | PRN
  Administered 2019-07-27: 1 via NASAL

## 2019-07-27 MED ORDER — SUCCINYLCHOLINE CHLORIDE 200 MG/10ML IV SOSY
PREFILLED_SYRINGE | INTRAVENOUS | Status: DC | PRN
  Administered 2019-07-27: 120 mg via INTRAVENOUS

## 2019-07-27 MED ORDER — SUCCINYLCHOLINE CHLORIDE 200 MG/10ML IV SOSY
PREFILLED_SYRINGE | INTRAVENOUS | Status: AC
  Filled 2019-07-27: qty 10

## 2019-07-27 MED ORDER — LIDOCAINE-EPINEPHRINE 1 %-1:100000 IJ SOLN
INTRAMUSCULAR | Status: AC
  Filled 2019-07-27: qty 1

## 2019-07-27 MED ORDER — OXYCODONE HCL 5 MG PO TABS: 5.0000 mg | ORAL_TABLET | Freq: Once | ORAL | Status: DC | PRN

## 2019-07-27 MED ORDER — ONDANSETRON HCL 4 MG/2ML IJ SOLN
INTRAMUSCULAR | Status: DC | PRN
  Administered 2019-07-27: 4 mg via INTRAVENOUS

## 2019-07-27 MED ORDER — ONDANSETRON HCL 4 MG/2ML IJ SOLN: 4.0000 mg | Freq: Once | INTRAMUSCULAR | Status: DC | PRN

## 2019-07-27 MED ORDER — ROCURONIUM BROMIDE 10 MG/ML (PF) SYRINGE
PREFILLED_SYRINGE | INTRAVENOUS | Status: AC
  Filled 2019-07-27: qty 10

## 2019-07-27 MED ORDER — PHENYLEPHRINE 40 MCG/ML (10ML) SYRINGE FOR IV PUSH (FOR BLOOD PRESSURE SUPPORT)
PREFILLED_SYRINGE | INTRAVENOUS | Status: AC
  Filled 2019-07-27: qty 10

## 2019-07-27 MED ORDER — ONDANSETRON HCL 4 MG/2ML IJ SOLN
INTRAMUSCULAR | Status: AC
  Filled 2019-07-27: qty 2

## 2019-07-27 MED ORDER — SUGAMMADEX SODIUM 500 MG/5ML IV SOLN
INTRAVENOUS | Status: AC
  Filled 2019-07-27: qty 5

## 2019-07-27 MED ORDER — PHENYLEPHRINE 40 MCG/ML (10ML) SYRINGE FOR IV PUSH (FOR BLOOD PRESSURE SUPPORT)
PREFILLED_SYRINGE | INTRAVENOUS | Status: DC | PRN
  Administered 2019-07-27: 80 ug via INTRAVENOUS

## 2019-07-27 MED ORDER — OXYCODONE HCL 5 MG/5ML PO SOLN: 5.0000 mg | Freq: Once | ORAL | Status: DC | PRN

## 2019-07-27 MED ORDER — ESMOLOL HCL 100 MG/10ML IV SOLN
INTRAVENOUS | Status: DC | PRN
  Administered 2019-07-27: 30 ug via INTRAVENOUS

## 2019-07-27 MED ORDER — LIDOCAINE 2% (20 MG/ML) 5 ML SYRINGE
INTRAMUSCULAR | Status: AC
  Filled 2019-07-27: qty 5

## 2019-07-27 MED ORDER — ALBUTEROL SULFATE HFA 108 (90 BASE) MCG/ACT IN AERS
INHALATION_SPRAY | RESPIRATORY_TRACT | Status: DC | PRN
  Administered 2019-07-27: 2 via RESPIRATORY_TRACT

## 2019-07-27 MED ORDER — OXYMETAZOLINE HCL 0.05 % NA SOLN
NASAL | Status: DC | PRN
  Administered 2019-07-27: 1 via TOPICAL

## 2019-07-27 MED ORDER — FENTANYL CITRATE (PF) 100 MCG/2ML IJ SOLN
INTRAMUSCULAR | Status: AC
  Filled 2019-07-27: qty 2

## 2019-07-27 MED ORDER — MIDAZOLAM HCL 2 MG/2ML IJ SOLN
INTRAMUSCULAR | Status: DC | PRN
  Administered 2019-07-27: 2 mg via INTRAVENOUS

## 2019-07-27 MED ORDER — ALBUTEROL SULFATE HFA 108 (90 BASE) MCG/ACT IN AERS
INHALATION_SPRAY | RESPIRATORY_TRACT | Status: AC
  Filled 2019-07-27: qty 6.7

## 2019-07-27 MED ORDER — MUPIROCIN 2 % EX OINT
TOPICAL_OINTMENT | CUTANEOUS | Status: AC
  Filled 2019-07-27: qty 22

## 2019-07-27 MED FILL — OXYCODONE-ACETAMINOPHEN 5-3: 5-325 | 3 days supply | Qty: 18 | Fill #0

## 2019-07-27 MED FILL — AMOXICILLIN 875 MG TABS: 875 | 3 days supply | Qty: 6 | Fill #0

## 2019-07-27 SURGICAL SUPPLY — 36 items
ATTRACTOMAT 16X20 MAGNETIC DRP (DRAPES) IMPLANT
CANISTER SUCT 1200ML W/VALVE (MISCELLANEOUS) ×2 IMPLANT
COAGULATOR SUCT 8FR VV (MISCELLANEOUS) ×2 IMPLANT
COVER WAND RF STERILE (DRAPES) IMPLANT
DECANTER SPIKE VIAL GLASS SM (MISCELLANEOUS) ×2 IMPLANT
DRSG NASOPORE 8CM (GAUZE/BANDAGES/DRESSINGS) IMPLANT
DRSG TELFA 3X8 NADH (GAUZE/BANDAGES/DRESSINGS) IMPLANT
ELECT REM PT RETURN 9FT ADLT (ELECTROSURGICAL) ×2
ELECTRODE REM PT RTRN 9FT ADLT (ELECTROSURGICAL) ×1 IMPLANT
GLOVE BIO SURGEON STRL SZ7.5 (GLOVE) ×2 IMPLANT
GLOVE BIOGEL PI IND STRL 6.5 (GLOVE) ×1 IMPLANT
GLOVE BIOGEL PI IND STRL 7.0 (GLOVE) ×1 IMPLANT
GLOVE BIOGEL PI INDICATOR 6.5 (GLOVE) ×1
GLOVE BIOGEL PI INDICATOR 7.0 (GLOVE) ×1
GLOVE SURG SS PI 6.0 STRL IVOR (GLOVE) ×4 IMPLANT
GLOVE SURG SS PI 7.0 STRL IVOR (GLOVE) ×2 IMPLANT
GOWN STRL REUS W/ TWL LRG LVL3 (GOWN DISPOSABLE) ×3 IMPLANT
GOWN STRL REUS W/TWL LRG LVL3 (GOWN DISPOSABLE) ×3
NEEDLE HYPO 25X1 1.5 SAFETY (NEEDLE) ×2 IMPLANT
NS IRRIG 1000ML POUR BTL (IV SOLUTION) ×2 IMPLANT
PACK BASIN DAY SURGERY FS (CUSTOM PROCEDURE TRAY) ×2 IMPLANT
PACK ENT DAY SURGERY (CUSTOM PROCEDURE TRAY) ×2 IMPLANT
SLEEVE SCD COMPRESS KNEE MED (MISCELLANEOUS) ×2 IMPLANT
SOLUTION BUTLER CLEAR DIP (MISCELLANEOUS) ×2 IMPLANT
SPLINT NASAL AIRWAY SILICONE (MISCELLANEOUS) ×2 IMPLANT
SPONGE GAUZE 2X2 8PLY STRL LF (GAUZE/BANDAGES/DRESSINGS) ×2 IMPLANT
SPONGE NEURO XRAY DETECT 1X3 (DISPOSABLE) ×2 IMPLANT
SUT CHROMIC 4 0 P 3 18 (SUTURE) ×2 IMPLANT
SUT PLAIN 4 0 ~~LOC~~ 1 (SUTURE) ×2 IMPLANT
SUT PROLENE 3 0 PS 2 (SUTURE) ×2 IMPLANT
SUT VIC AB 4-0 P-3 18XBRD (SUTURE) IMPLANT
SUT VIC AB 4-0 P3 18 (SUTURE)
TOWEL GREEN STERILE FF (TOWEL DISPOSABLE) ×2 IMPLANT
TUBE SALEM SUMP 12R W/ARV (TUBING) IMPLANT
TUBE SALEM SUMP 16 FR W/ARV (TUBING) ×2 IMPLANT
YANKAUER SUCT BULB TIP NO VENT (SUCTIONS) ×2 IMPLANT

## 2019-07-27 NOTE — H&P (Signed)
Cc: Chronic nasal obstruction  HPI: The patient is a 39 year old female who returns today for follow up evaluation of chronic nasal congestion and eustachian tube dysfunction. The patient was last seen 4 weeks ago. She underwent left myringotomy with tube placement. She continued on daily steroid nasal spray for her congestion. The patient states the fullness and pressure in her ear is better but she is still not able to breathe well through her nose. The patient is currently using Nasacort daily with minimal relief. Benadryl does give her some short term relief of her congestion. Previous sinus CT showed no evidence of sinus disease. She was noted to have a septal deviation and bilateral inferior turbinate hypertrophy. No other ENT, GI, or respiratory issue noted since the last visit.   Exam: General: Communicates without difficulty, well nourished, no acute distress. Head: Normocephalic, no evidence injury, no tenderness, facial buttresses intact without stepoff. Eyes: PERRL, EOMI. No scleral icterus, conjunctivae clear. Neuro: CN II exam reveals vision grossly intact. No nystagmus at any point of gaze. Ears: Auricles well formed without lesions. Ear canals are intact without mass or lesion. No erythema or edema is appreciated. The right TM is intact. The left ventilating tube is in place and patent. No drainage is noted. Nose: External evaluation reveals normal support and skin without lesions. Dorsum is intact. Anterior rhinoscopy reveals congested and edematous mucosa over anterior aspect of the inferior turbinates and nasal septum. No purulence is noted. Middle meatus is not well visualized. Oral:  Oral cavity and oropharynx are intact, symmetric, without erythema or edema. Neck: Full range of motion without pain. There is no significant lymphadenopathy. No masses palpable. Thyroid bed within normal limits to palpation. Parotid glands and submandibular glands equal bilaterally without mass. Trachea is  midline. Neuro:  CN 2-12 grossly intact. Gait normal. Vestibular: No nystagmus at any point of gaze.   Procedure:  Flexible Nasal Endoscopy: Risks, benefits, and alternatives of flexible endoscopy were explained to the patient. Specific mention was made of the risk of throat numbness with difficulty swallowing, possible bleeding from the nose and mouth, and pain from the procedure. The patient gave oral consent to proceed. The nasal cavities were decongested and anesthetised with a combination of oxymetazoline and 4% lidocaine solution. The flexible scope was inserted into the right nasal cavity. NSD and spur. Endoscopy of the inferior and middle meatus was performed. The edematous mucosa was as described above. No polyp, mass, or lesion was appreciated. Olfactory cleft was clear. Nasopharynx was clear. Turbinates were hypertrophied but without mass. Incomplete response to decongestion. The procedure was repeated on the contralateral side with similar findings. The patient tolerated the procedure well. Instructions were given to avoid eating or drinking for 2 hours.   AUDIOMETRIC TESTING:  I have read and reviewed the audiometric test, which shows borderline normal hearing bilaterally. The speech reception threshold is 5dB AD and 10dB AS. The discrimination score is 96% AD and 100% AS. The tympanogram is normal on the right.   Assessment 1. The left ventilating tube is in place and patent.  2. Left eustachian tube dysfunction.  3. Borderline normal hearing is noted bilaterally. 4. Chronic nasal mucosal congestion with right septal deviation, spur, and bilateral inferior turbinate reduction. No purulent drainage, polyps, or other suspicious mass or lesion is noted on today's nasal endoscopy.  Plan  1. The physical exam, hearing test, and nasal endoscopy findings are reviewed with the patient.  2. Left dry ear precautions.  3. Treatment options for  her congestion include conservative management with  daily steroid nasal spray versus septoplasty and bilateral inferior turbinate reduction. The risks, benefits, alternatives, and details of the procedure are reviewed with the patient. Questions are invited and answered. 4. The patient interested in proceeding with the procedures.  We will schedule the procedure in accordance with the patient's schedule.

## 2019-07-27 NOTE — Op Note (Signed)
DATE OF PROCEDURE: 07/27/2019  OPERATIVE REPORT   SURGEON: Leta Baptist, MD   PREOPERATIVE DIAGNOSES:  1. Nasal septal deviation.  2. Bilateral inferior turbinate hypertrophy.  3. Chronic nasal obstruction.  POSTOPERATIVE DIAGNOSES:  1. Nasal septal deviation.  2. Bilateral inferior turbinate hypertrophy.  3. Chronic nasal obstruction.  PROCEDURE PERFORMED:  1. Septoplasty.  2. Bilateral partial inferior turbinate resection.   ANESTHESIA: General endotracheal tube anesthesia.   COMPLICATIONS: None.   ESTIMATED BLOOD LOSS: 100 mL.   INDICATION FOR PROCEDURE: Paula King is a 39 y.o. female with a history of chronic nasal obstruction. The patient was treated with antihistamine, decongestant, and steroid nasal sprays. However, the patient continued to be symptomatic. On examination, the patient was noted to have bilateral severe inferior turbinate hypertrophy and significant nasal septal deviation, causing significant nasal obstruction. Based on the above findings, the decision was made for the patient to undergo the above-stated procedures. The risks, benefits, alternatives, and details of the procedures were discussed with the patient. Questions were invited and answered. Informed consent was obtained.   DESCRIPTION OF PROCEDURE: The patient was taken to the operating room and placed supine on the operating table. General endotracheal tube anesthesia was administered by the anesthesiologist. The patient was positioned, and prepped and draped in the standard fashion for nasal surgery. Pledgets soaked with Afrin were placed in both nasal cavities for decongestion. The pledgets were subsequently removed.   Examination of the nasal cavity revealed a severe nasal septal deviation. 1% lidocaine with 1:100,000 epinephrine was injected onto the nasal septum bilaterally. A hemitransfixion incision was made on the left side. The mucosal flap was carefully elevated on the left side. A cartilaginous  incision was made 1 cm superior to the caudal margin of the nasal septum. Mucosal flap was also elevated on the right side in the similar fashion. It should be noted that due to the severe septal deviation, the deviated portion of the cartilaginous and bony septum had to be removed in piecemeal fashion. Once the deviated portions were removed, a straight midline septum was achieved. The septum was then quilted with 4-0 plain gut sutures. The hemitransfixion incision was closed with interrupted 4-0 chromic sutures.   The inferior one half of both hypertrophied inferior turbinate was crossclamped with a Kelly clamp. The inferior one half of each inferior turbinate was then resected with a pair of cross cutting scissors. Hemostasis was achieved with a suction cautery device. Doyle splints were applied to the nasal septum.  The care of the patient was turned over to the anesthesiologist. The patient was awakened from anesthesia without difficulty. The patient was extubated and transferred to the recovery room in good condition.   OPERATIVE FINDINGS: Nasal septal deviation and bilateral inferior turbinate hypertrophy.   SPECIMEN: None.   FOLLOWUP CARE: The patient be discharged home once she is awake and alert. The patient will be placed on Percocet 1 tablets p.o. q.4 hours p.r.n. pain, and amoxicillin 875 mg p.o. b.i.d. for 3 days. The patient will follow up in my office next week for splint removal.   Alletta Mattos Raynelle Bring, MD

## 2019-07-27 NOTE — Anesthesia Procedure Notes (Signed)
Procedure Name: Intubation Date/Time: 07/27/2019 7:40 AM Performed by: Genelle Bal, CRNA Pre-anesthesia Checklist: Patient identified, Emergency Drugs available, Suction available and Patient being monitored Patient Re-evaluated:Patient Re-evaluated prior to induction Oxygen Delivery Method: Circle system utilized Preoxygenation: Pre-oxygenation with 100% oxygen Induction Type: IV induction Ventilation: Mask ventilation without difficulty Laryngoscope Size: Miller and 2 Grade View: Grade I Tube type: Oral Tube size: 7.0 mm Number of attempts: 1 Airway Equipment and Method: Stylet and Oral airway Placement Confirmation: ETT inserted through vocal cords under direct vision,  positive ETCO2 and breath sounds checked- equal and bilateral Secured at: 21 cm Tube secured with: Tape Dental Injury: Teeth and Oropharynx as per pre-operative assessment

## 2019-07-27 NOTE — Transfer of Care (Signed)
Immediate Anesthesia Transfer of Care Note  Patient: Paula King  Procedure(s) Performed: NASAL SEPTOPLASTY WITH TURBINATE REDUCTION (N/A Nose)  Patient Location: PACU  Anesthesia Type:General  Level of Consciousness: awake, alert  and oriented  Airway & Oxygen Therapy: Patient Spontanous Breathing and Patient connected to face mask oxygen  Post-op Assessment: Report given to RN and Post -op Vital signs reviewed and stable  Post vital signs: Reviewed and stable  Last Vitals:  Vitals Value Taken Time  BP 124/97 07/27/19 0845  Temp    Pulse 104 07/27/19 0849  Resp 24 07/27/19 0849  SpO2 98 % 07/27/19 0849  Vitals shown include unvalidated device data.  Last Pain:  Vitals:   07/27/19 0650  TempSrc: Tympanic  PainSc: 0-No pain      Patients Stated Pain Goal: 4 ([id number])  Complications: No apparent anesthesia complications

## 2019-07-27 NOTE — Anesthesia Postprocedure Evaluation (Signed)
Anesthesia Post Note  Patient: Paula King  Procedure(s) Performed: NASAL SEPTOPLASTY WITH TURBINATE REDUCTION (N/A Nose)     Patient location during evaluation: PACU Anesthesia Type: General Level of consciousness: awake and alert Pain management: pain level controlled Vital Signs Assessment: post-procedure vital signs reviewed and stable Respiratory status: spontaneous breathing, nonlabored ventilation, respiratory function stable and patient connected to nasal cannula oxygen Cardiovascular status: blood pressure returned to baseline and stable Postop Assessment: no apparent nausea or vomiting Anesthetic complications: no    Last Vitals:  Vitals:   07/27/19 0928 07/27/19 1003  BP: (!) 134/95 (!) 133/97  Pulse: 96 93  Resp: 20 16  Temp:  37.1 C  SpO2: 97% 95%    Last Pain:  Vitals:   07/27/19 1003  TempSrc: Oral  PainSc: 0-No pain                 Barnet Glasgow

## 2019-07-27 NOTE — Discharge Instructions (Addendum)

## 2019-07-30 ENCOUNTER — Encounter: Payer: Self-pay | Admitting: *Deleted

## 2019-08-06 MED FILL — ELIQUIS 5 MG TABLET: 5 | 30 days supply | Qty: 60 | Fill #1

## 2019-08-21 ENCOUNTER — Telehealth: Payer: Self-pay | Admitting: "Endocrinology

## 2019-08-21 ENCOUNTER — Encounter: Payer: Self-pay | Admitting: Family Medicine

## 2019-08-21 NOTE — Telephone Encounter (Signed)
CAN YOU RELEASE THE ORDER FOR THE 24 URINE

## 2019-08-22 ENCOUNTER — Other Ambulatory Visit: Payer: Self-pay

## 2019-08-22 ENCOUNTER — Ambulatory Visit: Payer: No Typology Code available for payment source | Admitting: "Endocrinology

## 2019-08-22 DIAGNOSIS — D3501 Benign neoplasm of right adrenal gland: Secondary | ICD-10-CM

## 2019-08-22 NOTE — Telephone Encounter (Signed)
Order released.

## 2019-08-24 ENCOUNTER — Ambulatory Visit: Payer: No Typology Code available for payment source | Admitting: Family Medicine

## 2019-08-28 ENCOUNTER — Ambulatory Visit (INDEPENDENT_AMBULATORY_CARE_PROVIDER_SITE_OTHER): Payer: No Typology Code available for payment source | Admitting: Family Medicine

## 2019-08-28 ENCOUNTER — Encounter: Payer: Self-pay | Admitting: Family Medicine

## 2019-08-28 ENCOUNTER — Other Ambulatory Visit: Payer: Self-pay

## 2019-08-28 VITALS — BP 122/66 | HR 90 | Temp 97.9°F | Resp 14 | Ht 64.0 in | Wt 260.0 lb

## 2019-08-28 DIAGNOSIS — E249 Cushing's syndrome, unspecified: Secondary | ICD-10-CM | POA: Diagnosis not present

## 2019-08-28 DIAGNOSIS — R000 Tachycardia, unspecified: Secondary | ICD-10-CM

## 2019-08-28 DIAGNOSIS — E669 Obesity, unspecified: Secondary | ICD-10-CM | POA: Diagnosis not present

## 2019-08-28 NOTE — Patient Instructions (Signed)
Change CPE to Friday 26th 2pm

## 2019-08-28 NOTE — Progress Notes (Signed)
   Subjective:    Patient ID: Paula King, female    DOB: 1980-07-16, 39 y.o.   MRN: [id number]  Patient presents for Discuss Weight Loss ([place] surgery ) Patient here to discuss weight loss surgery.  She has tried multiple modalities to help her lose weight throughout her adulthood.   her highest weight as an adult was 265 pounds.  She is currently on Contrave she has been on this for 4 weeks now after being on Saxenda with no significant weight loss.  Her weight is actually up 5 pounds 1 Contrave.  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  She does have history of preeclampsia with her last pregnancy which was approximately 8 years ago she also had borderline diabetes at that time.  She has tried weight watchers, alli system, low-carb low fat diets with minimal weight loss.  She also follows with [facility].   She did at lose 30 pounds with these have been to remain in her 50s but did come back.  Her lowest weight after phentermine was 197 pounds.  Unfortunately now due to her comorbidities she is unable to go on a stimulant medication.    Review Of Systems:  GEN- denies fatigue, fever, weight loss,weakness, recent illness HEENT- denies eye drainage, change in vision, nasal discharge, CVS- denies chest pain, palpitations RESP- denies SOB, cough, wheeze ABD- denies N/V, change in stools, abd pain GU- denies dysuria, hematuria, dribbling, incontinence MSK- denies joint pain, muscle aches, injury Neuro- denies headache, dizziness, syncope, seizure activity       Objective:    BP 122/66   Pulse 90   Temp 97.9 F (36.6 C) (Temporal)   Resp 14   Ht 5\' 4"  (1.626 m)   Wt 260 lb (117.9 kg)   SpO2 100%   BMI 44.63 kg/m  GEN- NAD, alert and oriented x3 HEENT- PERRL, EOMI, non injected sclera, pink conjunctiva, MMM, oropharynx clear Neck- Supple, + thyromegaly CVS- RRR, no murmur RESP-CTAB ABD-NABS,soft,NT,ND Psych normal  affect and mood EXT- No edema Pulses- Radial, DP- 2+        Assessment & Plan:      Problem List Items Addressed This Visit      Unprioritized   Class 3 obesity - Primary    Class III obesity with comorbidities including 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  She has a goiter on examination.  She has history of reflux but does not require daily medication.  She has history of borderline diabetes mellitus as well as preeclampsia I think that she would be a good candidate for weight loss surgery after trying multiple other lifestyle modifications and medications.      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 Any transcriptional errors that result from this process are unintentional.

## 2019-08-29 ENCOUNTER — Encounter: Payer: Self-pay | Admitting: Family Medicine

## 2019-08-29 NOTE — Assessment & Plan Note (Signed)
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  She has a goiter on examination.  She has history of reflux but does not require daily medication.  She has history of borderline diabetes mellitus as well as preeclampsia I think that she would be a good candidate for weight loss surgery after trying multiple other lifestyle modifications and medications.

## 2019-08-31 LAB — CREATININE, URINE, 24 HOUR: Creatinine, 24H Ur: 2.03 g/(24.h) (ref 0.50–2.15)

## 2019-09-06 ENCOUNTER — Other Ambulatory Visit (HOSPITAL_COMMUNITY): Payer: Self-pay | Admitting: Surgery

## 2019-09-06 DIAGNOSIS — Z01818 Encounter for other preprocedural examination: Secondary | ICD-10-CM

## 2019-09-06 LAB — PROTIME-INR: INR: 1 (ref 0.9–1.1)

## 2019-09-07 ENCOUNTER — Other Ambulatory Visit: Payer: Self-pay

## 2019-09-07 ENCOUNTER — Encounter: Payer: Self-pay | Admitting: "Endocrinology

## 2019-09-07 ENCOUNTER — Ambulatory Visit (INDEPENDENT_AMBULATORY_CARE_PROVIDER_SITE_OTHER): Payer: No Typology Code available for payment source | Admitting: "Endocrinology

## 2019-09-07 ENCOUNTER — Ambulatory Visit (HOSPITAL_COMMUNITY)
Admission: RE | Admit: 2019-09-07 | Discharge: 2019-09-07 | Disposition: A | Discharge status: Home / Self Care | Payer: No Typology Code available for payment source | Source: Ambulatory Visit | Attending: Surgery | Admitting: Surgery

## 2019-09-07 DIAGNOSIS — E049 Nontoxic goiter, unspecified: Secondary | ICD-10-CM | POA: Diagnosis not present

## 2019-09-07 DIAGNOSIS — Z01818 Encounter for other preprocedural examination: Secondary | ICD-10-CM

## 2019-09-07 DIAGNOSIS — D3501 Benign neoplasm of right adrenal gland: Secondary | ICD-10-CM

## 2019-09-07 NOTE — Progress Notes (Signed)
Bilateral lower extremity venous duplex has been completed. Preliminary results can be found in CV Proc through chart review.  Results were given to April at Dr. Ron Parker office.  09/07/19 4:02 PM Paula King RVT

## 2019-09-07 NOTE — Progress Notes (Signed)
09/07/2019, 12:42 PM                                Endocrinology Telehealth Visit Follow up Note -During COVID -19 Pandemic  I connected with Paula King on 09/07/2019   by telephone and verified that I am speaking with the correct person using two identifiers. Paula King, 08/27/1980. she has verbally consented to this visit. All issues noted in this document were discussed and addressed. The format was not optimal for physical exam.   Subjective:    Patient ID: Paula King, female    DOB: Oct 10, 1980, PCP Paula Rossetti, MD   Past Medical History:  Diagnosis Date  . COVID-19 virus IgG antibody detected   . 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[place])   . Diverticulitis   . Hypertension   . Preeclampsia    Past Surgical History:  Procedure Laterality Date  . CESAREAN SECTION    . CHOLECYSTECTOMY    . NASAL SEPTOPLASTY W/ TURBINOPLASTY N/A 07/27/2019   Procedure: NASAL SEPTOPLASTY WITH TURBINATE REDUCTION;  Surgeon: Paula Baptist, MD;  Location: [facility];  Service: ENT;  Laterality: N/A;  . WISDOM TOOTH EXTRACTION     Social History   Socioeconomic History  . Marital status: Single    Spouse name: Not on file  . Number of children: 1  . Years of education: Not on file  . Highest education level: Some college, no degree  Occupational History    Employer: [employer]  Tobacco Use  . Smoking status: Never Smoker  . Smokeless tobacco: Never Used  Substance and Sexual Activity  . Alcohol use: No  . Drug use: No  . Sexual activity: Yes    Birth control/protection: None  Other Topics Concern  . Not on file  Social History Narrative   Lives in [place], [place].   Single, not dating.    Has one child, age 44, 66.   Has family in [place], [place].   Enjoys swimming.   Enjoys time with family.    Eats all foods.   Wears seatbelt.    Social Determinants of Health   Financial Resource Strain:   . Difficulty of Paying  Living Expenses:   Food Insecurity:   . Worried About Charity fundraiser in the Last Year:   . Arboriculturist in the Last Year:   Transportation Needs:   . Film/video editor (Medical):   Marland Kitchen Lack of Transportation (Non-Medical):   Physical Activity:   . Days of Exercise per Week:   . Minutes of Exercise per Session:   Stress:   . Feeling of Stress :   Social Connections:   . Frequency of Communication with Friends and Family:   . Frequency of Social Gatherings with Friends and Family:   . Attends Religious Services:   . Active Member of Clubs or Organizations:   . Attends Archivist Meetings:   Marland Kitchen Marital Status:    Outpatient Encounter Medications as of 09/07/2019  Medication Sig  . albuterol (VENTOLIN HFA) 108 (90 Base) MCG/ACT inhaler Inhale 2 puffs into the lungs every 6 (six) hours as needed for wheezing  or shortness of breath.  Marland Kitchen aspirin EC 81 MG tablet Take 81 mg by mouth daily.  Marland Kitchen atenolol (TENORMIN) 25 MG tablet Take 0.5 tablets (12.5 mg total) by mouth daily. (Patient taking differently: Take 12.5 mg by mouth daily as needed. )  . cetirizine (ZYRTEC) 10 MG tablet Take 10 mg by mouth daily.   . cholecalciferol (VITAMIN D) 1000 units tablet Take 1,000 Units by mouth daily.   . Cyanocobalamin (B-12) 1000 MCG TABS Take 1 tablet by mouth daily.   . Naltrexone-buPROPion HCl ER 8-90 MG TB12 Start 1 tablet every morning for 7 days, then 1 tablet twice daily for 7 days, then 2 tablets every morning and one in the evening   No facility-administered encounter medications on file as of 09/07/2019.   ALLERGIES: Allergies  Allergen Reactions  . Ciprofloxacin     IV Only-Patient started sweating and becoming clammy when was infused with IV cipro on 08/09/14. Can tolerate PO Ciprofloxacin     VACCINATION STATUS: Immunization History  Administered Date(s) Administered  . Influenza,inj,Quad PF,6+ Mos 04/26/2019    HPI Paula King is 39 y.o. female who presents  today with a medical history as above. she is being seen in follow up for right adrenal adenoma . - Her history starts in February 2016 when she underwent CT scan of her abdomen due to left lower quadrant pain which incidentally showed 2.7 cm mass  on her right adrenal gland which was reported to be consistent with adenoma. - Repeat CT abdomen in November 2018 revealed that the adenoma persisted and measured 3 cm ( HU 0) still favoring adenoma. -More recently on March 24, 2019, she underwent CT angiogram related to work-up in the hospital when she was admitted for COVID-19, the right adrenal adenoma was found to be measuring 3.2cm. -She was exposed to large amounts of steroids during the course of her illness including dexamethasone. -Previously ,she underwent hormonal work-up to determine functional status of this adenoma.  Findings have not been conclusive, except for one instance of 24-hour urine free cortisol at 59. -Her subsequent functional studies were within normal limits, did not require definitive intervention and was put on observation with plan to repeat plasma free metanephrines, A1c, and thyroid function tests.  These are all within normal limits.  -Before this visit, she underwent 1 mg overnight dexamethasone suppression test with the next morning serum cortisol being 3.4 ng per DL. -She reports that she has gained approximately 10 pounds since her last visit here, due to the fact that she has stopped the Contrave.  She is currently following with her bariatric team in [place], weight loss surgery surgery tentatively in August 2021. She has no new complaints today.  - She has history of prediabetes, hypertension which required 1 medication with atenolol 25 mg by mouth daily.  - She has 1 child, 5 years of age. - She denies diaphoresis, uncontrolled hypertension, headaches.  Review of Systems  Limited as above.  Objective:    There were no vitals taken for this visit.   Wt Readings from Last 3 Encounters:  08/28/19 260 lb (117.9 kg)  07/27/19 256 lb 9.9 oz (116.4 kg)  07/18/19 255 lb (115.7 kg)    Physical Exam   CMP ( most recent) CMP     Component Value Date/Time   NA 141 07/18/2019 1505   K 4.0 07/18/2019 1505   CL 104 07/18/2019 1505   CO2 27 07/18/2019 1505   GLUCOSE 138 (H) 07/18/2019  1505   BUN 15 07/18/2019 1505   CREATININE 0.98 07/18/2019 1505   CALCIUM 10.4 (H) 07/18/2019 1505   PROT 7.0 07/18/2019 1505   ALBUMIN 3.9 03/23/2019 2120   AST 19 07/18/2019 1505   ALT 23 07/18/2019 1505   ALKPHOS 48 03/23/2019 2120   BILITOT 0.3 07/18/2019 1505   GFRNONAA >60 03/23/2019 2120   GFRNONAA 64 04/18/2017 1041   GFRAA >60 03/23/2019 2120   GFRAA 74 04/18/2017 1041     Diabetic Labs (most recent): Lab Results  Component Value Date   HGBA1C 5.4 07/18/2019   HGBA1C 5.9 (H) 02/16/2019   HGBA1C 5.6 12/13/2018     Lipid Panel ( most recent) Lipid Panel     Component Value Date/Time   CHOL 147 05/24/2018 1133   TRIG 134 03/06/2019 2132   HDL 43 (L) 05/24/2018 1133   CHOLHDL 3.4 05/24/2018 1133   LDLCALC 84 05/24/2018 1133       Lab Results  Component Value Date   TSH 2.620 12/13/2018   TSH 1.83 04/18/2017   FREET4 1.06 12/13/2018    Results for ANONDA, BLANCHET (MRN [id number]) as of 05/17/2019 17:11  Ref. Range 08/23/2017 09:04 12/13/2018 10:47 05/14/2019 07:57  ACTH Latest Ref Range: 7.2 - 63.3 pg/mL 11.9    Cortisol - AM Overnight Dex Supp Latest Units: mcg/dL 6.8  3.4 (L)  Metanephrine, Pl Latest Ref Range: 0.0 - 88.0 pg/mL  <10.0   Normetanephrine, Pl Latest Ref Range: 0.0 - 110.1 pg/mL  42.9     Most recent 24-hour urine free cortisol on November 28, 2017 was 14 improving from 59.  05/10/2017   CT abdomen  Radiology description of her right adrenal mass: Adrenal: Previously noted right adrenal mass is similar to the prior examination measuring 3.0 x 2.6 cm and is diffusely low-attenuation (0 HU), compatible with  an adenoma. Left adrenal gland and bilateral kidneys are normal in appearance. No hydroureteronephrosis in the visualized portions of the abdomen.   March 24, 2019, 3.2 cm right adrenal adenoma redemonstrated.   Assessment & Plan:   1. Adrenal adenoma, right  -3.2cm slowly growing stable adenoma.  Radiologic features favoring benign adenoma. - a series of functional studies have been normal-unremarkable.  Patient has difficulty losing weight, see below.  Her most recent overnight dexamethasone suppression test did not exclude Cushing syndrome.  She did have normal plasma metanephrines in 2019.    Her immediate concern is her weight problem, see below.  After her possible bariatric surgery for weight loss, she will be considered for repeat work-up and surgical excision of this adrenal adenoma if necessary.  2) morbid obesity -This is likely unrelated to her adrenal adenoma.  This is her major health concern and she wishes to address it.  She did have A1c of 5.9 in August 2020 consistent with prediabetes, subsequently improved to 5.4%.  She is not on any medication intervention for this.   -She is being cleared for bariatric surgery, tentatively in August 2021 according to her report.  She is encouraged to maintain close follow-up with your bariatric team. - she  admits there is a room for improvement in her diet and drink choices. -  Suggestion is made for her to avoid simple carbohydrates  from her diet including Cakes, Sweet Desserts / Pastries, Ice Cream, Soda (diet and regular), Sweet Tea, Candies, Chips, Cookies, Sweet Pastries,  Store Bought Juices, Alcohol in Excess of  1-2 drinks a day, Artificial Sweeteners, Coffee Creamer, and "Sugar-free"  Products. This will help patient to have stable blood glucose profile and potentially avoid unintended weight gain.   3. Goiter - She does not have family history of thyroid cancer.  She has significant thyromegaly with no discrete nodules,  euthyroid based on her thyroid function tests, no intervention at this time.     - I advised patient to maintain close follow up with Paula Rossetti, MD for primary care needs.     - Time spent on this patient care encounter:  20 minutes of which 50% was spent in  counseling and the rest reviewing  her current and  previous labs / studies and medications  doses and developing a plan for long term care. Paula King  participated in the discussions, expressed understanding, and voiced agreement with the above plans.  All questions were answered to her satisfaction. she is encouraged to contact clinic should she have any questions or concerns prior to her return visit.   Follow up plan: Return in about 4 months (around 01/07/2020) for Follow up with no Labs.   Glade Lloyd, MD [facility] [address] [place], [place] [address] Phone: [phone]  Fax: [phone]     09/07/2019, 12:42 PM  This note was partially dictated with voice recognition software. Similar sounding words can be transcribed inadequately or may not  be corrected upon review.

## 2019-09-10 ENCOUNTER — Other Ambulatory Visit: Payer: Self-pay | Admitting: Surgery

## 2019-09-10 ENCOUNTER — Other Ambulatory Visit (HOSPITAL_COMMUNITY): Payer: Self-pay | Admitting: Surgery

## 2019-09-11 ENCOUNTER — Encounter (HOSPITAL_COMMUNITY): Payer: No Typology Code available for payment source

## 2019-09-12 ENCOUNTER — Encounter: Payer: No Typology Code available for payment source | Admitting: Family Medicine

## 2019-09-18 ENCOUNTER — Encounter: Payer: Self-pay | Admitting: Family Medicine

## 2019-09-18 MED FILL — ELIQUIS 5 MG TABLET: 5 | 30 days supply | Qty: 60 | Fill #2

## 2019-09-21 ENCOUNTER — Encounter: Payer: Self-pay | Admitting: Family Medicine

## 2019-09-21 ENCOUNTER — Ambulatory Visit (INDEPENDENT_AMBULATORY_CARE_PROVIDER_SITE_OTHER): Payer: No Typology Code available for payment source | Admitting: Family Medicine

## 2019-09-21 ENCOUNTER — Other Ambulatory Visit: Payer: Self-pay

## 2019-09-21 VITALS — BP 136/88 | HR 90 | Temp 98.0°F | Resp 14 | Ht 64.0 in | Wt 260.0 lb

## 2019-09-21 DIAGNOSIS — Z0001 Encounter for general adult medical examination with abnormal findings: Secondary | ICD-10-CM

## 2019-09-21 DIAGNOSIS — F909 Attention-deficit hyperactivity disorder, unspecified type: Secondary | ICD-10-CM | POA: Diagnosis not present

## 2019-09-21 DIAGNOSIS — Z124 Encounter for screening for malignant neoplasm of cervix: Secondary | ICD-10-CM | POA: Diagnosis not present

## 2019-09-21 DIAGNOSIS — Z0000 Encounter for general adult medical examination without abnormal findings: Secondary | ICD-10-CM

## 2019-09-21 MED ORDER — ATOMOXETINE HCL 40 MG PO CAPS: 40.0000 mg | ORAL_CAPSULE | Freq: Every day | ORAL | 2 refills | Status: DC

## 2019-09-21 MED FILL — ATOMOXETINE HCL 40 MG CAPS: 40 | 30 days supply | Qty: 30 | Fill #0

## 2019-09-21 NOTE — Patient Instructions (Signed)
F/U  3 months 

## 2019-09-21 NOTE — Assessment & Plan Note (Signed)
Start straterra 40mg  once a day  F/U 3 months

## 2019-09-21 NOTE — Progress Notes (Addendum)
   Subjective:    Patient ID: Paula King, female    DOB: [birth year], 39 y.o.   MRN: [id number]  Patient presents for Gynecologic Exam (is fasting)  Patient here for complete physical exam.  Medications reviewed. History reviewed  Immunizations flu shot up-to-date, pt declines TDAP   Previous GYN [facility] but overdue for PAP   Follows with endocrinology for Cushings like syndrome and general surgery- planning for weight loss surgery She weaned off the contrave did not have any weight loss with the med  She had labs done 3 weeks ago with surgeon, including Lipids   She did ask about going back on nonstimulant medication to help with her ADD.  She is in a new job and finds herself overwhelmed with keeping things straight and focusing.  She was on Strattera in the past before she went to stimulant medication.  Of course we cannot use any stimulants based on her chronic medical problems.  LMP- 1 week ago   She is not sexually active   Follows with eye doctor and dentist    Still gets some SOB when exerting herself, uses inhaler as needed.   Review Of Systems:  GEN- denies fatigue, fever, weight loss,weakness, recent illness HEENT- denies eye drainage, change in vision, nasal discharge, CVS- denies chest pain, palpitations RESP-+SOB with exertion , cough, wheeze ABD- denies N/V, change in stools, abd pain GU- denies dysuria, hematuria, dribbling, incontinence MSK- denies joint pain, muscle aches, injury Neuro- denies headache, dizziness, syncope, seizure activity       Objective:    BP 136/88   Pulse 90   Temp 98 F (36.7 C) (Temporal)   Resp 14   Ht 5\' 4"  (1.626 m)   Wt 260 lb (117.9 kg)   SpO2 97%   BMI 44.63 kg/m  GEN- NAD, alert and oriented x3 HEENT- PERRL, EOMI, non injected sclera, pink conjunctiva, MMM, oropharynx clear Neck- Supple, +  thyromegaly CVS- RRR, no murmur RESP-CTAB GU- normal external genitalia, vaginal mucosa pink and moist, cervix  visualized no growth, mild blood form os, minimal thin clear discharge, no CMT, no ovarian masses, uterus normal size EXT- No edema Pulses- Radial, DP- 2+        Assessment & Plan:      Problem List Items Addressed This Visit      Unprioritized   ADD (attention deficit disorder)    Start straterra 40mg  once a day  F/U 3 months        Other Visit Diagnoses    Routine general medical examination at a health care facility    -  Primary   CPE done, PAP with HPV, since miscarriage she has avoided GYN exam, if neg can repeat in 3  YEAR. MAT AUNT with breast cancer, no first degree relative, Mammo age 59    Cervical cancer screening       Relevant Orders   PAP, Thin Prep w/HPV rflx HPV Type 16/18 ([facility])      Note: This dictation was prepared with Dragon dictation along with smaller phrase technology. Any transcriptional errors that result from this process are unintentional.

## 2019-09-24 LAB — PAP, TP IMAGING W/ HPV RNA, RFLX HPV TYPE 16,18/45: HPV DNA High Risk: NOT DETECTED

## 2019-09-25 ENCOUNTER — Other Ambulatory Visit: Payer: Self-pay | Admitting: Family Medicine

## 2019-09-25 ENCOUNTER — Encounter: Payer: Self-pay | Admitting: Family Medicine

## 2019-09-25 MED ORDER — METRONIDAZOLE 500 MG PO TABS: 500.0000 mg | ORAL_TABLET | Freq: Two times a day (BID) | ORAL | 0 refills | Status: DC

## 2019-10-02 ENCOUNTER — Encounter: Payer: Self-pay | Admitting: Family Medicine

## 2019-10-22 ENCOUNTER — Encounter: Payer: No Typology Code available for payment source | Attending: Surgery | Admitting: Dietician

## 2019-10-22 ENCOUNTER — Other Ambulatory Visit: Payer: Self-pay

## 2019-10-22 DIAGNOSIS — E669 Obesity, unspecified: Secondary | ICD-10-CM | POA: Diagnosis not present

## 2019-10-22 NOTE — Progress Notes (Signed)
Nutrition Assessment for Bariatric Surgery Medical Nutrition Therapy   Patient was seen on 10/22/2019 for Pre-Operative Nutrition Assessment. Letter of approval faxed to [facility] bariatric surgery program coordinator on 10/23/2019.   Referral stated Supervised Weight Loss (SWL) visits needed: 0  Planned surgery: Sleeve Gastrectomy  Pt expectation of surgery: to be healthier, come off medications, be able to breathe better and move more   NUTRITION ASSESSMENT   Anthropometrics  Start weight at [facility]: 261 lbs (date: 10/22/19) Height: 63.5 in BMI: 45.5 kg/m2     Lifestyle & Dietary Hx Works as a Marine scientist for GI doctor, formerly worked in the ED. States she has struggled with hx of Cushing syndrome and had COVID-19 last year, so now it is more difficult to do intense/extended activity. States her endocrinologist told her not to snack but to eat only 3 meals per day. States she tries to stick to this but finds herself hungry in between meals followed by overeating at her 3 meals. 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   24-Hr Dietary Recall First Meal: 2 boiled eggs + apple  Snack: - Second Meal: salad (or chicken english muffin)  Snack: - Third Meal: tacos (made with beef or [place]) Snack: - Beverages: water (64 oz/day) , Dr. Malachi Bonds (20 oz about 3 days/week)    NUTRITION DIAGNOSIS  Overweight/obesity ([place]-3.3) related to past poor dietary habits and physical inactivity as evidenced by patient w/ planned Sleeve Gastrectomy surgery following dietary guidelines for continued weight loss.    NUTRITION INTERVENTION  Nutrition counseling (C-1) and education (E-2) to facilitate bariatric surgery goals.  Pre-Op Goals Reviewed with the Patient . Track food and beverage intake (pen and paper, MyFitness Pal, Baritastic app, etc.) . Make healthy food choices while monitoring portion sizes . Consume 3 meals per day or try  to eat every 3-5 hours . Avoid concentrated sugars and fried foods . Keep sugar & fat in the single digits per serving on food labels . Practice CHEWING your food (aim for applesauce consistency) . Practice not drinking 15 minutes before, during, and 30 minutes after each meal and snack . Avoid all carbonated beverages (ex: soda, sparkling beverages)  . Limit caffeinated beverages (ex: coffee, tea, energy drinks) . Avoid all sugar-sweetened beverages (ex: regular soda, sports drinks)  . Avoid alcohol  . Aim for 64-100 ounces of FLUID daily (with at least half of fluid intake being plain water)  . Aim for at least 60-80 grams of PROTEIN daily . Look for a liquid protein source that contains ?15 g protein and ?5 g carbohydrate (ex: shakes, drinks, shots) . Make a list of non-food related activities . Physical activity is an important part of a healthy lifestyle so keep it moving! The goal is to reach 150 minutes of exercise per week, including cardiovascular and weight baring activity.  Handouts Provided Include  . Bariatric Surgery handouts (Nutrition Visits, Pre-Op Goals, Protein Shakes, Vitamins & Minerals)  Learning Style & Readiness for Change Teaching method utilized: Visual & Auditory  Demonstrated degree of understanding via: Teach Back  Barriers to learning/adherence to lifestyle change: None Identified   MONITORING & EVALUATION Dietary intake, weekly physical activity, body weight, and pre-op goals reached at next nutrition visit.   Next Steps Patient is to follow up at [facility] for Pre-Op Class (>2 weeks before surgery) for further nutrition education.

## 2019-10-23 ENCOUNTER — Encounter: Payer: Self-pay | Admitting: Dietician

## 2019-10-24 ENCOUNTER — Telehealth: Payer: No Typology Code available for payment source | Admitting: Family

## 2019-10-24 ENCOUNTER — Encounter: Payer: Self-pay | Admitting: Family Medicine

## 2019-10-24 DIAGNOSIS — R399 Unspecified symptoms and signs involving the genitourinary system: Secondary | ICD-10-CM

## 2019-10-24 DIAGNOSIS — R109 Unspecified abdominal pain: Secondary | ICD-10-CM

## 2019-10-24 MED ORDER — NITROFURANTOIN MONOHYD MACRO 100 MG PO CAPS: 100.0000 mg | ORAL_CAPSULE | Freq: Two times a day (BID) | ORAL | 0 refills | Status: DC

## 2019-10-24 MED ORDER — FLUCONAZOLE 150 MG PO TABS: 150.0000 mg | ORAL_TABLET | Freq: Once | ORAL | 0 refills | Status: AC

## 2019-10-24 MED FILL — ATOMOXETINE HCL 40 MG CAPS: 40 | 30 days supply | Qty: 30 | Fill #1

## 2019-10-24 NOTE — Progress Notes (Signed)
Based on what you shared with me, I feel your condition warrants further evaluation and I recommend that you be seen for a face to face office visit.  Given you are having UTI symptoms and back pain you need to be seen face to face for a urine sample to rule out a more serious infection.    NOTE: If you entered your credit card information for this eVisit, you will not be charged. You may see a "hold" on your card for the $35 but that hold will drop off and you will not have a charge processed.   If you are having a true medical emergency please call 911.      For an urgent face to face visit, [facility] has five urgent care centers for your convenience:      NEW:  [facility] [place] [facility] [place] Get Driving Directions [id number] [place], [place] [address] . 10 am - 6pm Monday - Friday    [place] [facility]) Get Driving Directions [id number] [address] [place], [place] [address] . 10 am to 8 pm Monday-Friday . 12 pm to 8 pm [facility] Get Driving Directions [id number] [place], [place], [place] [address] . 8 am to 8 pm Monday-Friday . 9 am to 6 pm Saturday . 11 am to 6 pm Sunday     [facility] at [facility] Get Driving Directions  [id number] [address] [place], [place] [address] . 8 am to 8 pm Monday-Friday . 8 am to 4 pm [facility] [place] Get Driving Directions [id number] [place], [place] [address] . 12 pm to 6 pm Monday-Friday      Your e-visit answers were reviewed by a board certified advanced clinical practitioner to complete your personal care plan.  Thank you for using e-Visits.

## 2019-11-02 ENCOUNTER — Ambulatory Visit (HOSPITAL_COMMUNITY): Payer: No Typology Code available for payment source

## 2019-11-02 ENCOUNTER — Other Ambulatory Visit (HOSPITAL_COMMUNITY): Payer: No Typology Code available for payment source

## 2019-11-12 ENCOUNTER — Ambulatory Visit (HOSPITAL_COMMUNITY)
Admission: RE | Admit: 2019-11-12 | Discharge: 2019-11-12 | Disposition: A | Discharge status: Home / Self Care | Payer: No Typology Code available for payment source | Source: Ambulatory Visit | Attending: Surgery | Admitting: Surgery

## 2019-11-12 ENCOUNTER — Other Ambulatory Visit (HOSPITAL_COMMUNITY): Payer: Self-pay | Admitting: Surgery

## 2019-11-12 ENCOUNTER — Other Ambulatory Visit: Payer: Self-pay

## 2019-11-12 IMAGING — DX DG CHEST 2V
2 series · 2 of 2 positions shown · non-contrast
Comparison: [DATE]

CLINICAL DATA: Preoperative evaluation for bariatric surgery

EXAM:
CHEST - 2 VIEW

[chest pa]
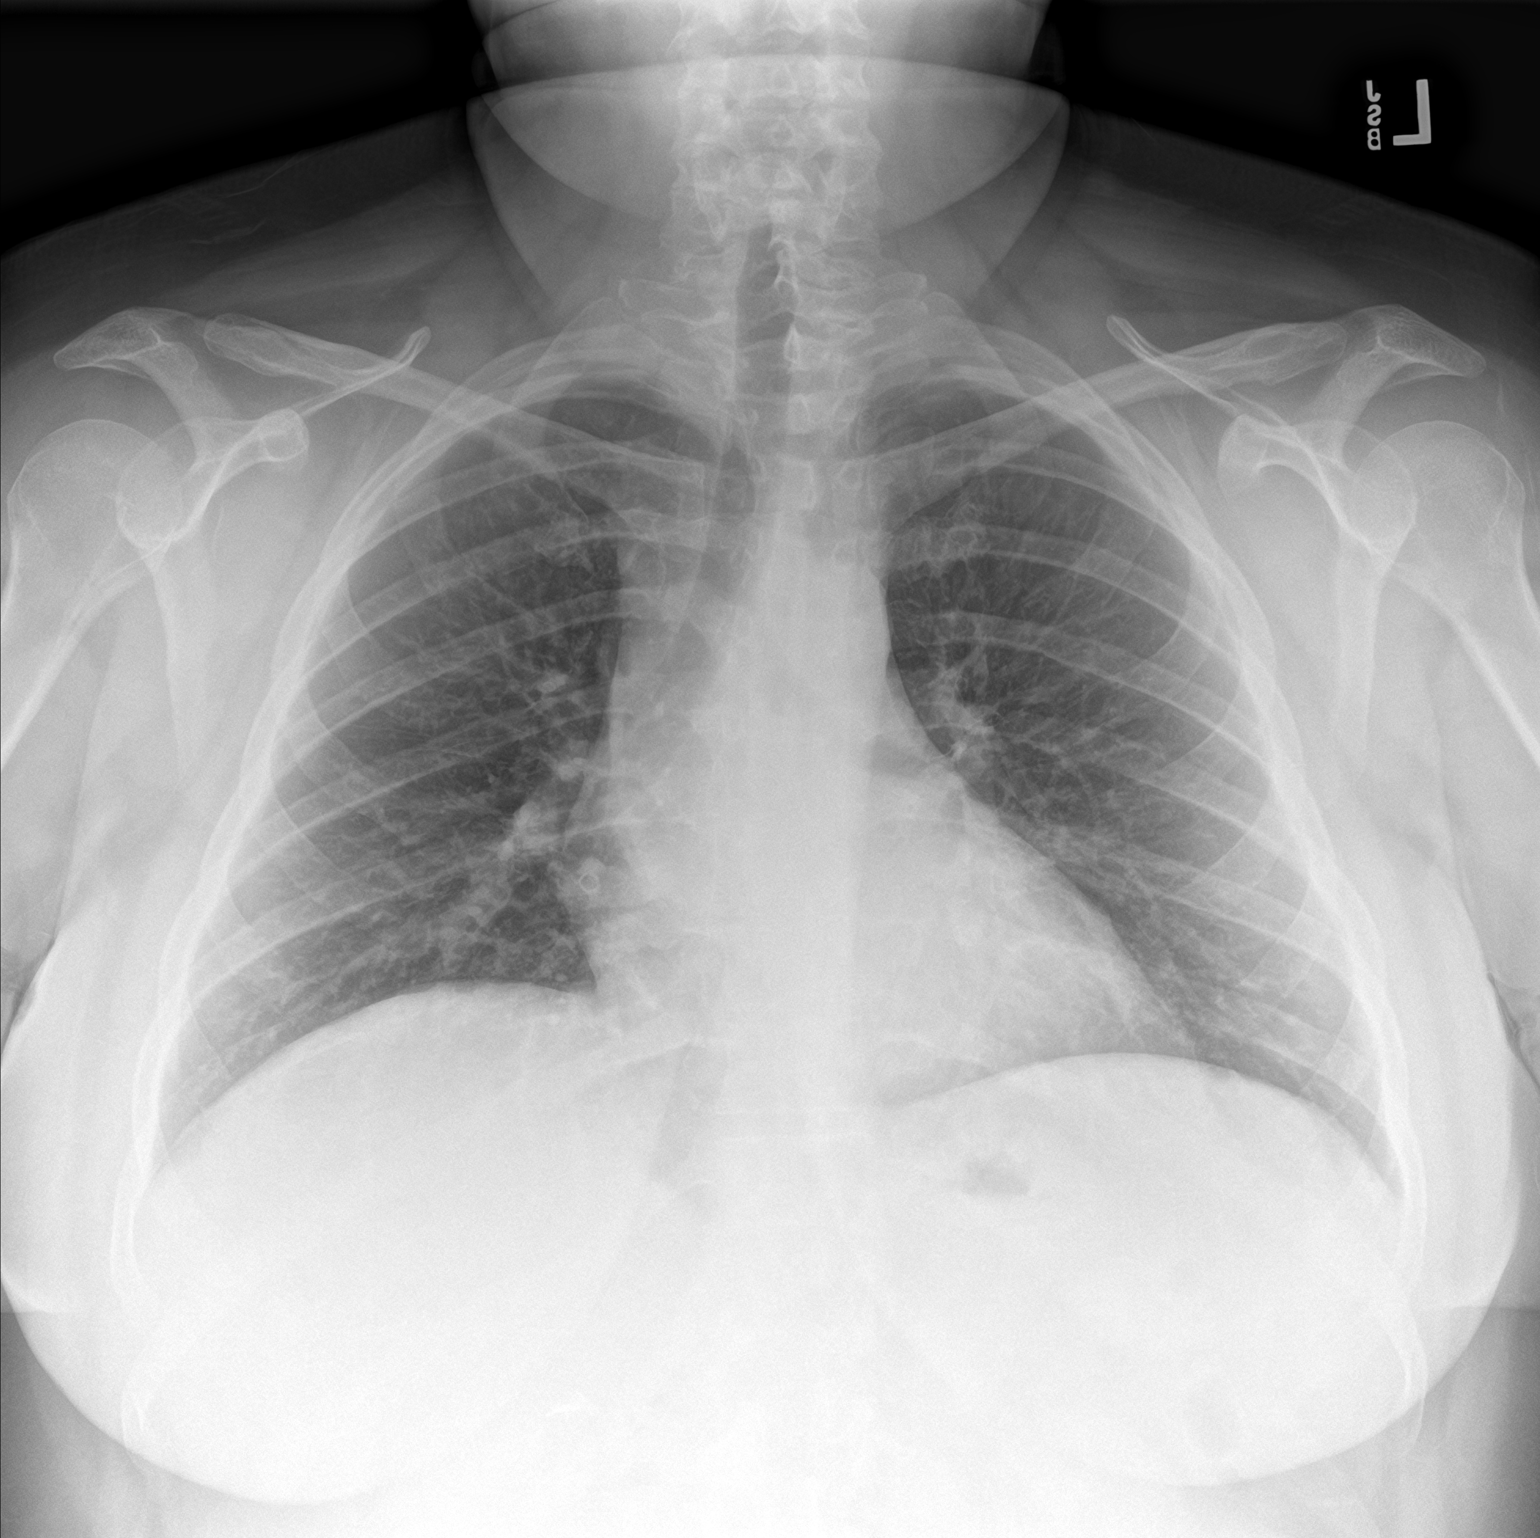

[chest lat]
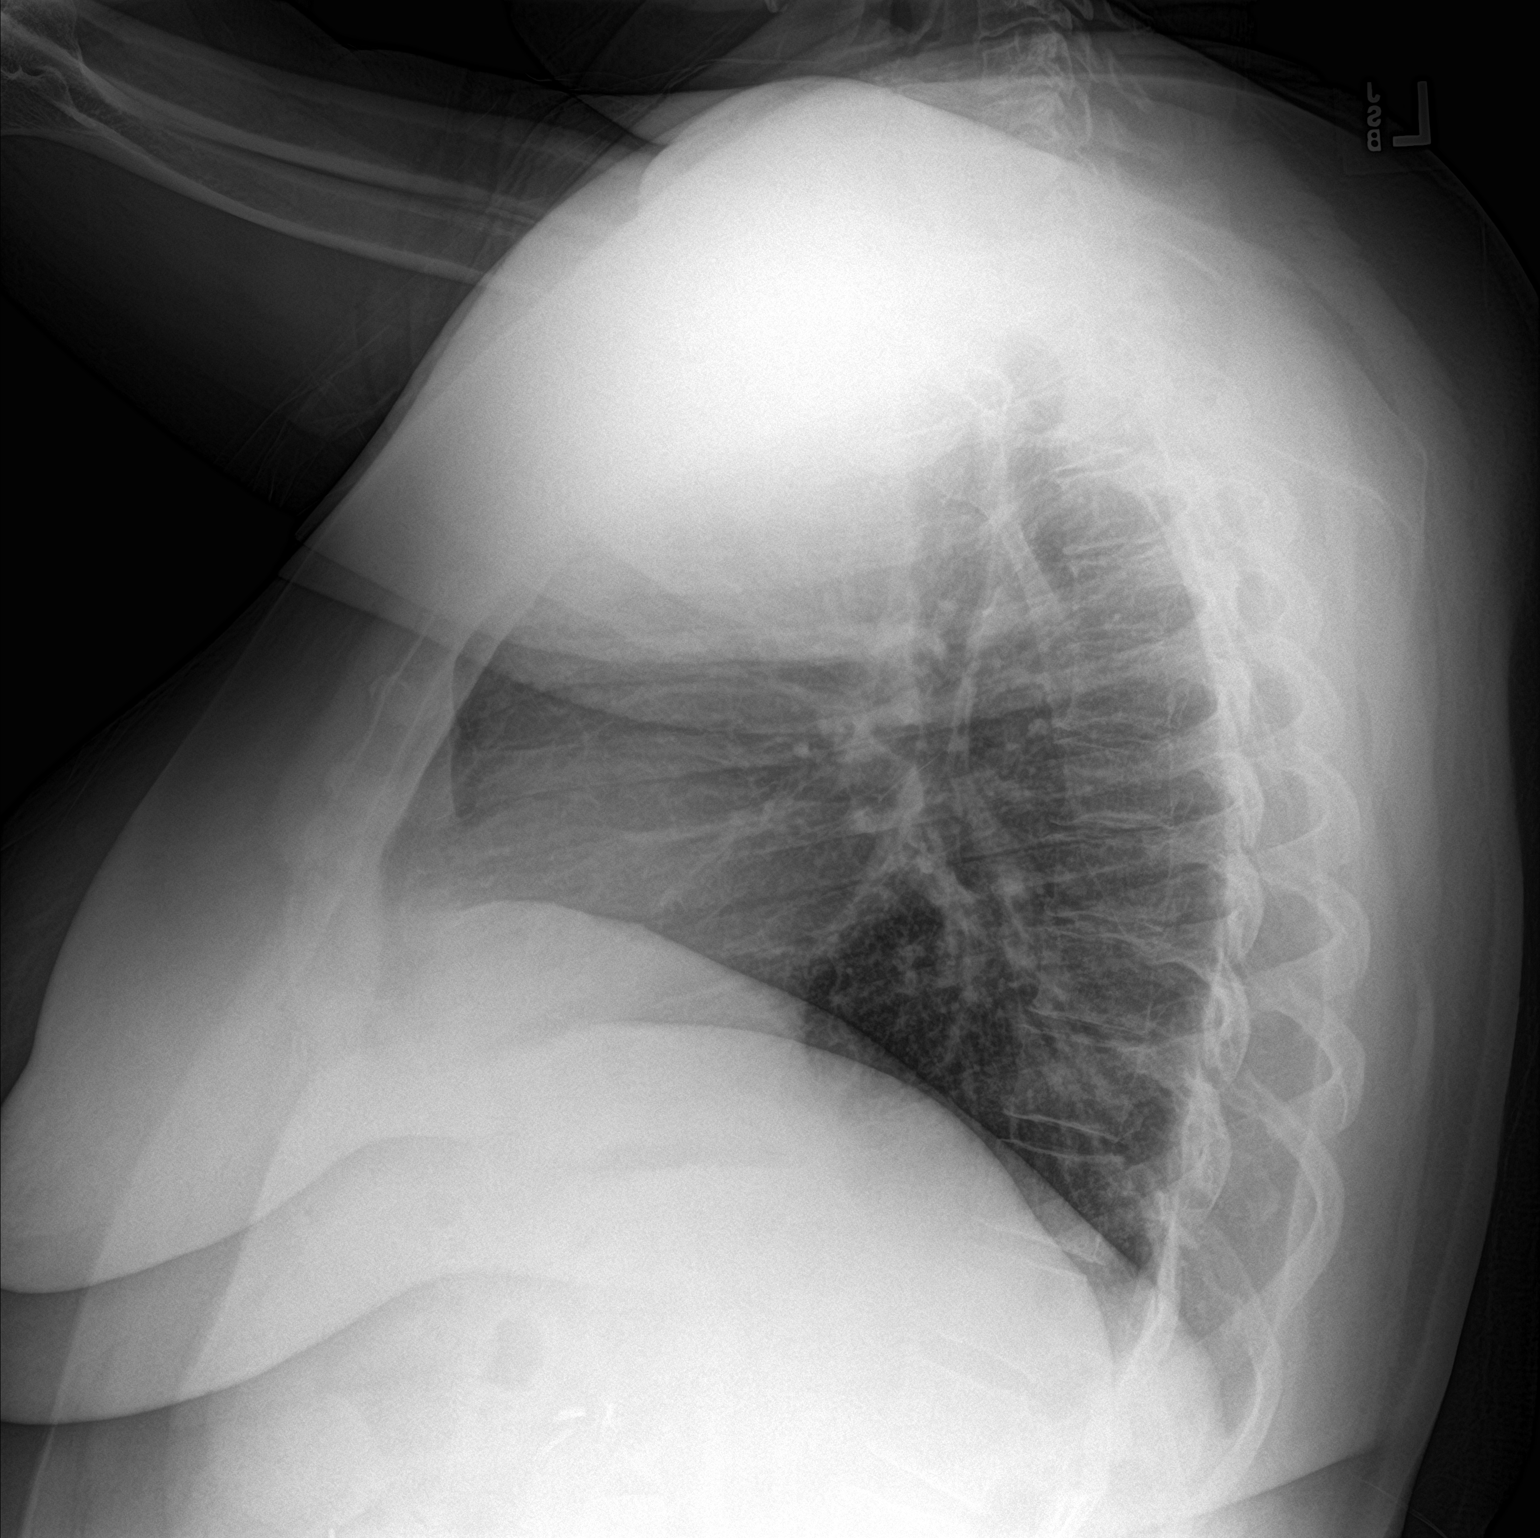

[2 of 2 positions shown; findings below may reference images not displayed]

FINDINGS: Normal heart size, mediastinal contours, and pulmonary vascularity.

Lungs clear.

No pleural effusion or pneumothorax.

Bones unremarkable.
IMPRESSION: Normal exam.

## 2019-11-12 IMAGING — RF DG UGI W SINGLE CM
12 of 19 series · 15 of 24 positions shown · non-contrast
Comparison: CT abdomen [DATE]

CLINICAL DATA: Morbid obesity, pre bariatric surgery

EXAM:
UPPER GI SERIES WITH KUB
TECHNIQUE: After obtaining a scout radiograph a routine upper GI series was
performed using thin barium
FLUOROSCOPY TIME:  Fluoroscopy Time:  2 minutes 36 seconds
Radiation Exposure Index (if provided by the fluoroscopic device):
110.6 mGy
Number of Acquired Spot Images: 2 plus multiple fluoroscopic screen
captures

[Series 1: t ap supine · 0.15mm/px · 1 of 1 slices shown]
[im 1/1]
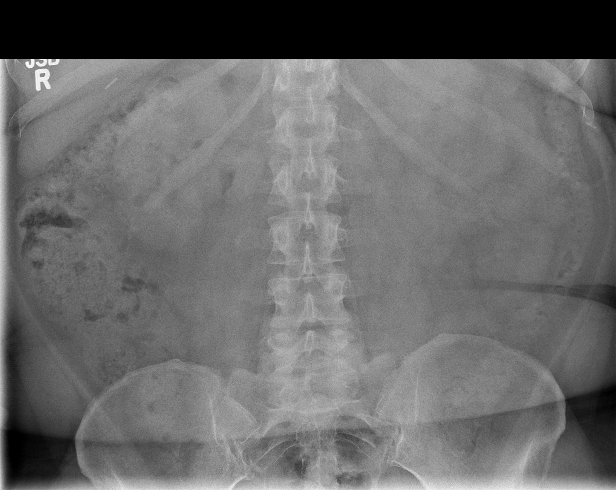

[Series 3: cp_standard · 0.18mm/px · 2 of 106 frames shown (1 of 11)]
[frame 16/106]
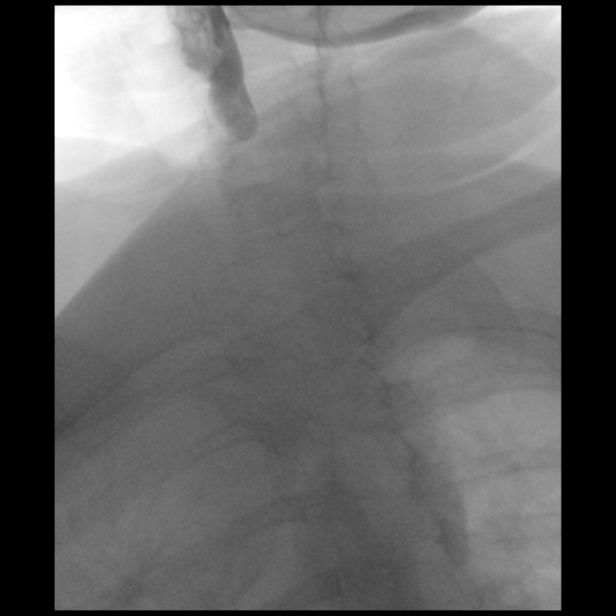
[frame 91/106]
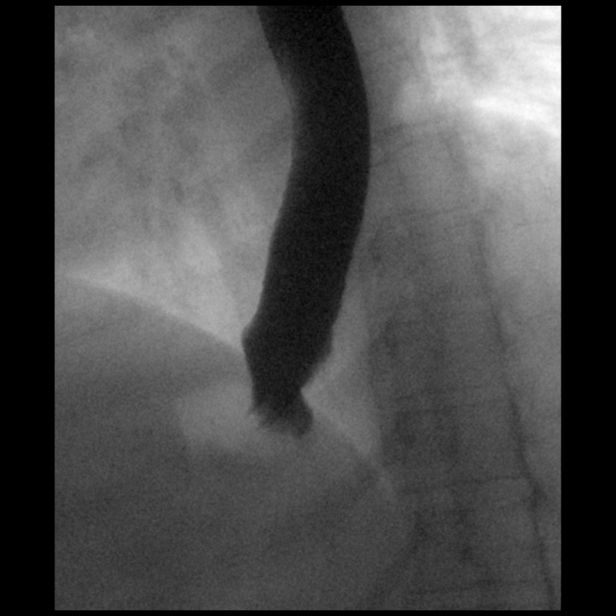

[Series 4: cp_standard · 0.18mm/px · 3 of 90 frames shown (2 of 11)]
[frame 1/90]
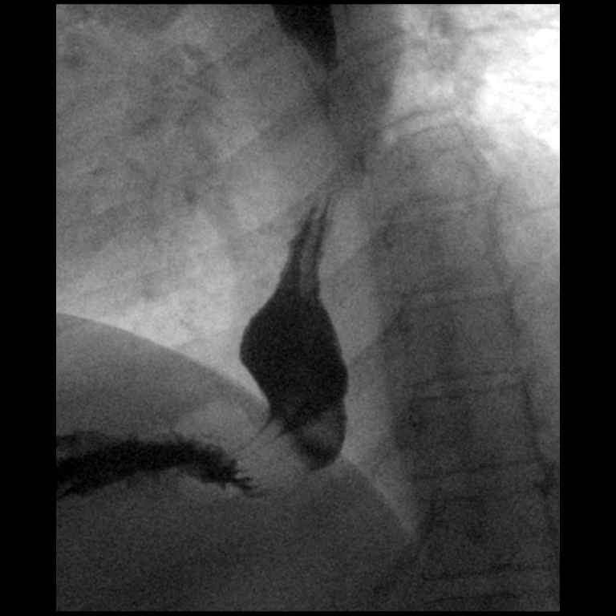
[frame 46/90]
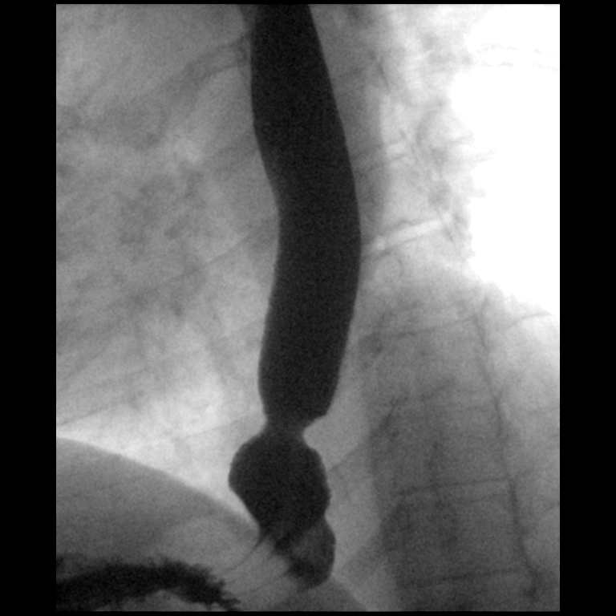
[frame 77/90]
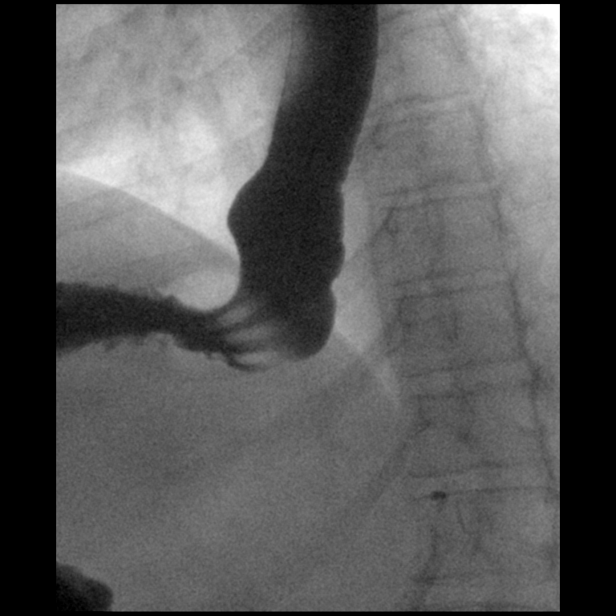

[Series 6: cp_standard · 0.18mm/px · 1 of 1 slices shown (3 of 11)]
[im 1/1]
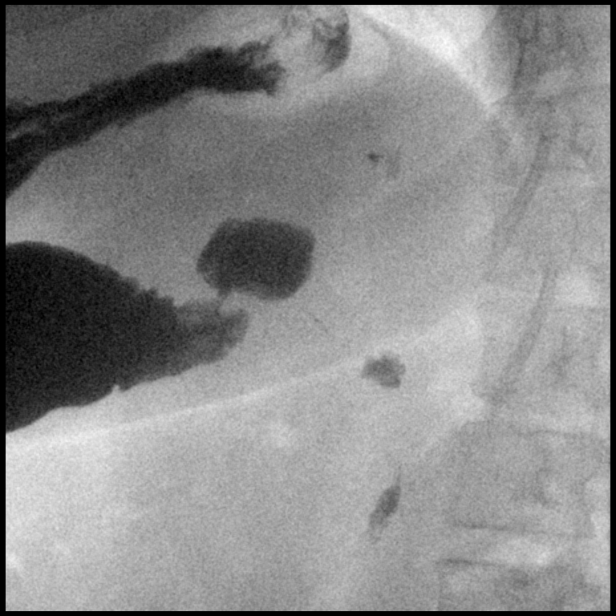

[Series 9: cp_standard · 0.18mm/px · 1 of 1 slices shown (4 of 11)]
[im 1/1]
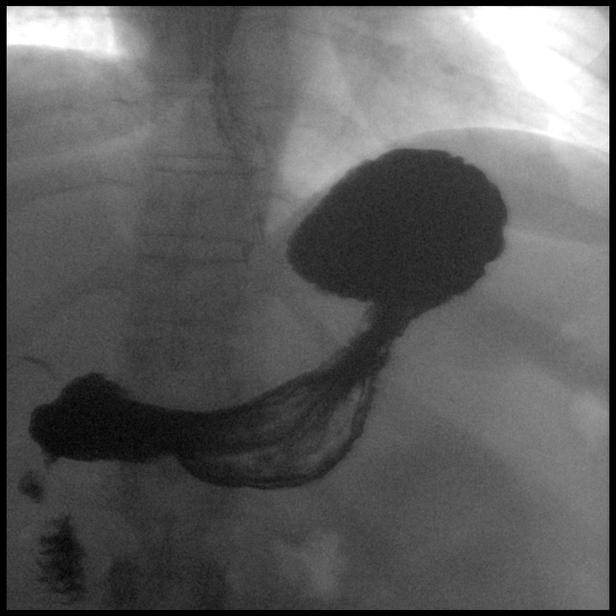

[Series 10: cp_standard · 0.19mm/px · 1 of 1 slices shown (5 of 11)]
[im 1/1]
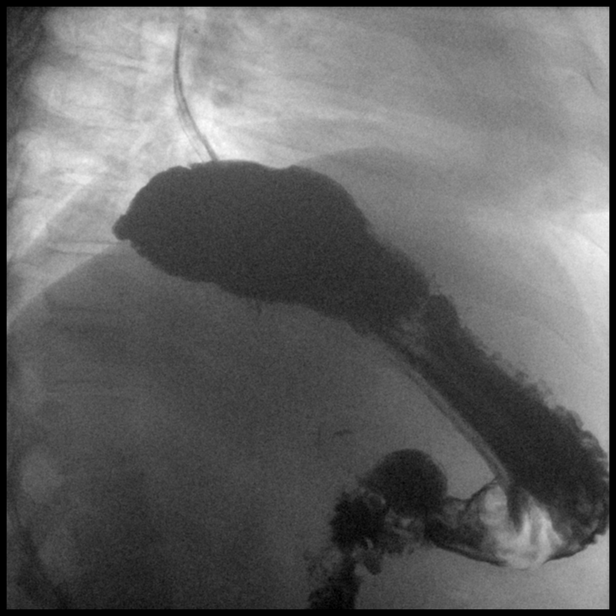

[Series 12: cp_standard · 0.19mm/px · 1 of 1 slices shown (6 of 11)]
[im 1/1]
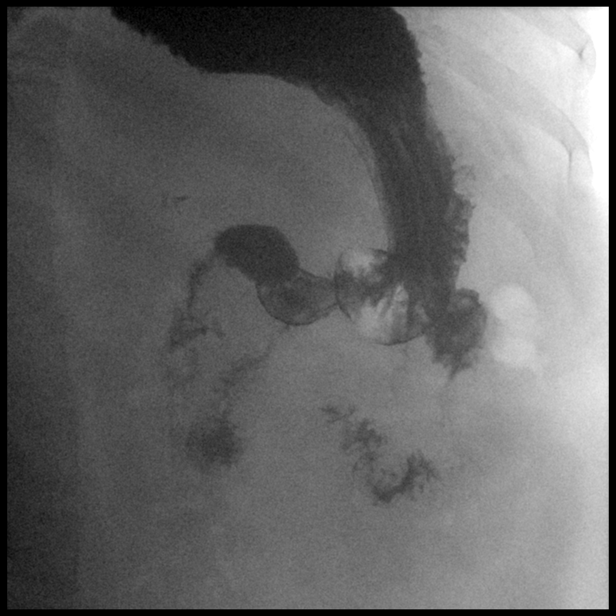

[Series 13: cp_standard · 0.20mm/px · 1 of 1 slices shown (7 of 11)]
[im 1/1]
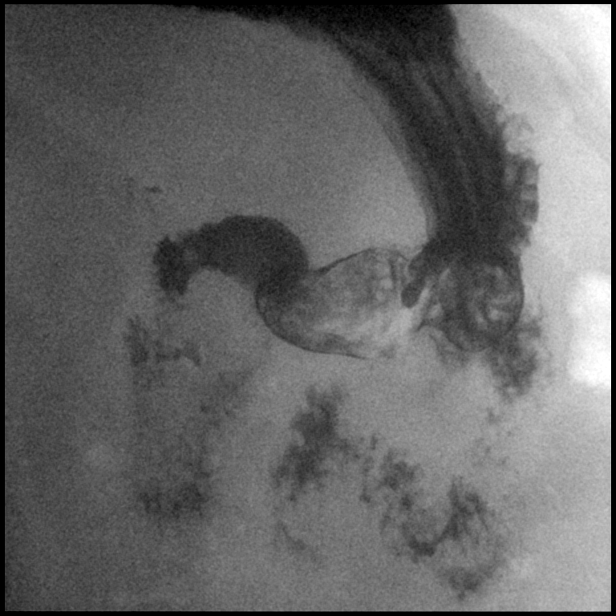

[Series 15: cp_standard · 0.20mm/px · 1 of 1 slices shown (8 of 11)]
[im 1/1]
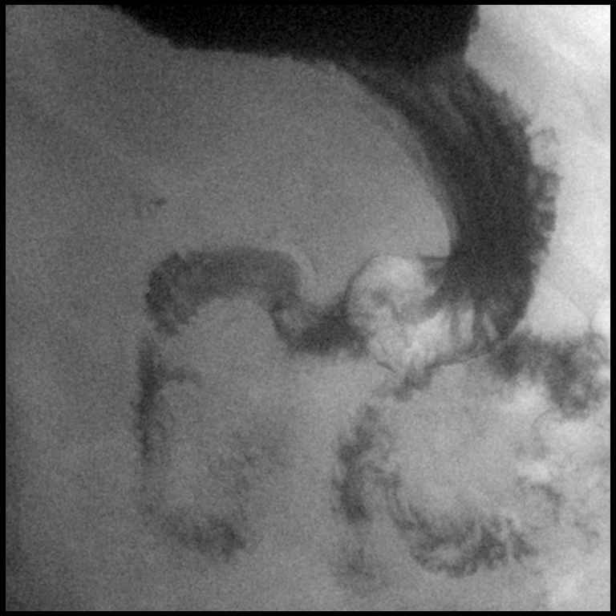

[Series 18: cp_standard · 0.19mm/px · 1 of 1 slices shown (9 of 11)]
[im 1/1]
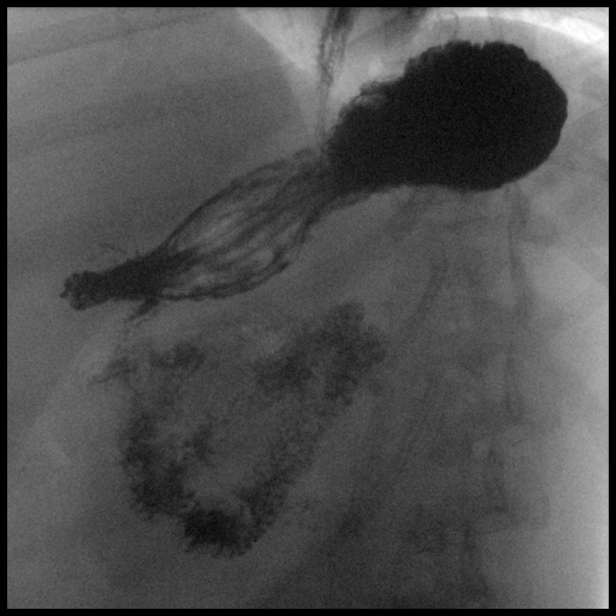

[Series 19: cp_standard · 0.18mm/px · 1 of 1 slices shown (10 of 11)]
[im 1/1]
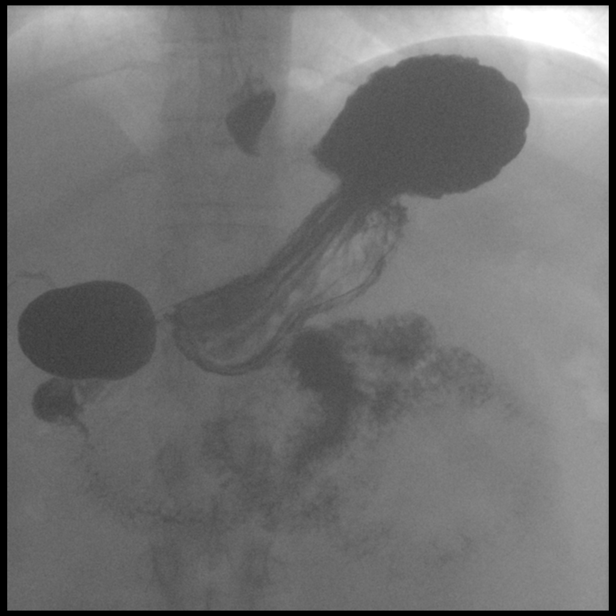

[Series 21: cp_standard · 0.28mm/px · 1 of 1 slices shown (11 of 11)]
[im 1/1]
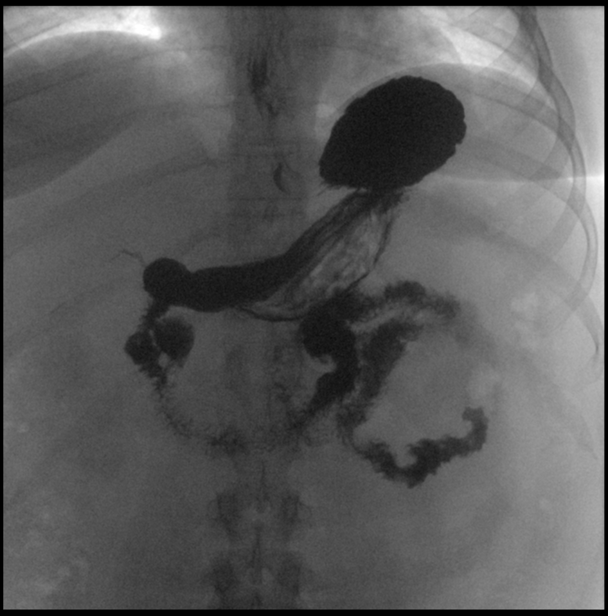

[15 of 24 positions shown; findings below may reference images not displayed]

FINDINGS: Normal esophageal distention and motility.

No esophageal mass or stricture.

No hiatal hernia.

Normal gastric anatomy in position.

Normal rugal fold pattern without gastric mass, ulceration or outlet
obstruction.

No reflux witnessed during exam.

Duodenal bulb and sweep normal morphology.

Incidentally noted duodenal diverticulum at LEFT border of C-loop.

Ligament of Treitz normal position.

Jejunal loops unremarkable.
IMPRESSION: Normal upper GI exam with incidentally noted duodenal diverticulum.

## 2019-11-19 ENCOUNTER — Ambulatory Visit: Payer: No Typology Code available for payment source | Admitting: Nutrition

## 2019-11-19 ENCOUNTER — Ambulatory Visit (HOSPITAL_COMMUNITY)
Admission: RE | Admit: 2019-11-19 | Discharge: 2019-11-19 | Disposition: A | Discharge status: Home / Self Care | Payer: No Typology Code available for payment source | Source: Ambulatory Visit | Attending: Surgery | Admitting: Surgery

## 2019-11-19 ENCOUNTER — Other Ambulatory Visit: Payer: Self-pay

## 2019-11-19 DIAGNOSIS — Z6841 Body Mass Index (BMI) 40.0 and over, adult: Secondary | ICD-10-CM | POA: Insufficient documentation

## 2019-12-19 ENCOUNTER — Ambulatory Visit (INDEPENDENT_AMBULATORY_CARE_PROVIDER_SITE_OTHER): Payer: No Typology Code available for payment source | Admitting: Psychology

## 2019-12-19 DIAGNOSIS — F509 Eating disorder, unspecified: Secondary | ICD-10-CM

## 2019-12-21 ENCOUNTER — Other Ambulatory Visit: Payer: Self-pay

## 2019-12-21 ENCOUNTER — Ambulatory Visit (INDEPENDENT_AMBULATORY_CARE_PROVIDER_SITE_OTHER): Payer: No Typology Code available for payment source | Admitting: Family Medicine

## 2019-12-21 ENCOUNTER — Encounter: Payer: Self-pay | Admitting: Family Medicine

## 2019-12-21 VITALS — BP 126/82 | HR 82 | Temp 98.0°F | Resp 14 | Ht 64.0 in | Wt 269.0 lb

## 2019-12-21 DIAGNOSIS — F418 Other specified anxiety disorders: Secondary | ICD-10-CM | POA: Diagnosis not present

## 2019-12-21 DIAGNOSIS — R609 Edema, unspecified: Secondary | ICD-10-CM

## 2019-12-21 DIAGNOSIS — R600 Localized edema: Secondary | ICD-10-CM

## 2019-12-21 DIAGNOSIS — Z6841 Body Mass Index (BMI) 40.0 and over, adult: Secondary | ICD-10-CM

## 2019-12-21 DIAGNOSIS — F909 Attention-deficit hyperactivity disorder, unspecified type: Secondary | ICD-10-CM

## 2019-12-21 MED ORDER — BUSPIRONE HCL 7.5 MG PO TABS: 7.5000 mg | ORAL_TABLET | Freq: Two times a day (BID) | ORAL | 2 refills | Status: DC

## 2019-12-21 MED FILL — busPIRone HCL 7.5 MG TABS: 7.5 | 30 days supply | Qty: 60 | Fill #0

## 2019-12-21 NOTE — Progress Notes (Signed)
Subjective:    Patient ID: Paula King, female    DOB: January 30, 1981, 39 y.o.   MRN: [id number]  Patient presents for Follow-up (ADHD meds) Patient here to follow-up medications.  She is currently in the process of going through bariatric weight loss surgery.  She was seen by the psychologist and she scored very high on her PHQ-9 and GAD-7.  At that time she was having significant difficulties at work and had just had an intervention at work which made her anxiety very high at that appointment.  It was recommended that she contact me to discuss her mood before she would be cleared for surgical intervention for her weight loss surgery.  She admits to stressors at work she is trying to find a better work environment for herself at this time. She admits that she does have some anxiety and depressed mood that is situational because of the work situation.  In the past she was on Zoloft she took this for about a month or 2 in her postpartum state has not been on medication otherwise.  Her sleep is good.  She just feels mostly on edge especially during the work day  Still taking her Strattera which helps with her concentration the setting of her ADHD.  She does have to take this in the evening because it makes her sleepy.  Gets some swelling in ankles being up on feet working, requeted compression hose  No SOB associated, it does go down with elevation   Buena Irish, [place]  Fax [phone]  Review Of Systems:  GEN- denies fatigue, fever, weight loss,weakness, recent illness HEENT- denies eye drainage, change in vision, nasal discharge, CVS- denies chest pain, palpitations RESP- denies SOB, cough, wheeze ABD- denies N/V, change in stools, abd pain GU- denies dysuria, hematuria, dribbling, incontinence MSK- denies joint pain, muscle aches, injury Neuro- denies headache, dizziness, syncope, seizure activity       Objective:    BP 126/82   Pulse 82   Temp 98 F (36.7 C) (Temporal)    Resp 14   Ht 5\' 4"  (1.626 m)   Wt 269 lb (122 kg)   SpO2 97%   BMI 46.17 kg/m  GEN- NAD, alert and oriented x3 HEENT- PERRL, EOMI, non injected sclera, pink conjunctiva, MMM, oropharynx clear Neck- Supple, no thyromegaly CVS- RRR, no murmur RESP-CTAB Psych- normal affect and mood, well groomed, normal speech, good eye contact, no SI  EXT- trace ankle  edema Pulses- Radial2+   GAD7 score 1, pHQ9 score 1      Assessment & Plan:      Problem List Items Addressed This Visit      Unprioritized   ADD (attention deficit disorder)    Continue straterra to help control ADD symptoms at work  Situational anxiety at work. Will start buspar 7.5mg  BID to help with anxiety during the day. I think she can be medically cleared to have weight loss surgery She is trying to find a better position for work, but meds will help in meantime  Compression hose script written for mild edema due to standing most of day       Obesity - Primary    Other Visit Diagnoses    Situational anxiety       Relevant Medications   busPIRone (BUSPAR) 7.5 MG tablet   Peripheral edema          Note: This dictation was prepared with Dragon dictation along with smaller phrase technology. Any transcriptional errors  that result from this process are unintentional.

## 2019-12-21 NOTE — Patient Instructions (Signed)
F/U 3 weeks for meds Stat buspar

## 2019-12-23 ENCOUNTER — Encounter: Payer: Self-pay | Admitting: Family Medicine

## 2019-12-23 NOTE — Assessment & Plan Note (Signed)
Continue straterra to help control ADD symptoms at work  Situational anxiety at work. Will start buspar 7.5mg  BID to help with anxiety during the day. I think she can be medically cleared to have weight loss surgery She is trying to find a better position for work, but meds will help in meantime  Compression hose script written for mild edema due to standing most of day

## 2019-12-27 ENCOUNTER — Ambulatory Visit (INDEPENDENT_AMBULATORY_CARE_PROVIDER_SITE_OTHER): Payer: No Typology Code available for payment source | Admitting: Psychology

## 2019-12-27 DIAGNOSIS — F509 Eating disorder, unspecified: Secondary | ICD-10-CM | POA: Diagnosis not present

## 2020-01-07 ENCOUNTER — Ambulatory Visit: Payer: No Typology Code available for payment source | Admitting: "Endocrinology

## 2020-01-11 ENCOUNTER — Other Ambulatory Visit: Payer: Self-pay

## 2020-01-11 ENCOUNTER — Encounter: Payer: Self-pay | Admitting: Family Medicine

## 2020-01-11 ENCOUNTER — Ambulatory Visit: Payer: Self-pay | Admitting: Surgery

## 2020-01-11 ENCOUNTER — Ambulatory Visit (INDEPENDENT_AMBULATORY_CARE_PROVIDER_SITE_OTHER): Payer: No Typology Code available for payment source | Admitting: Family Medicine

## 2020-01-11 VITALS — BP 128/74 | HR 80 | Temp 97.9°F | Resp 14 | Ht 64.0 in | Wt 269.0 lb

## 2020-01-11 DIAGNOSIS — F418 Other specified anxiety disorders: Secondary | ICD-10-CM | POA: Diagnosis not present

## 2020-01-11 MED ORDER — BUSPIRONE HCL 7.5 MG PO TABS: 7.5000 mg | ORAL_TABLET | Freq: Two times a day (BID) | ORAL | 3 refills | Status: DC

## 2020-01-11 NOTE — Patient Instructions (Signed)
F/U  3 months 

## 2020-01-11 NOTE — Progress Notes (Signed)
   Subjective:    Patient ID: Paula King, female    DOB: 10/22/1980, 39 y.o.   MRN: [id number]  Patient presents for Follow-up Patient here for interim follow-up on medications.  Again approximately 3 weeks ago.  At that time she was having significant stressors due to some issues for work.  She needed to be medically cleared for her weight loss surgery in the setting of her situational anxiety.  She also has underlying ADD which is controlled with Strattera.  She was started on BuSpar 7.5 mg twice daily.  Today buspar has worked well for her.Initially felt a little sleepy on the med, but now adjusted and feels anxiety is controlled She was cleared for her surgery from psychology as well She has Pre-op appt this afternoon  Review Of Systems:  GEN- denies fatigue, fever, weight loss,weakness, recent illness HEENT- denies eye drainage, change in vision, nasal discharge, CVS- denies chest pain, palpitations RESP- denies SOB, cough, wheeze ABD- denies N/V, change in stools, abd pain Neuro- denies headache, dizziness, syncope, seizure activity       Objective:    BP 128/74   Pulse 80   Temp 97.9 F (36.6 C) (Temporal)   Resp 14   Ht 5\' 4"  (1.626 m)   Wt 269 lb (122 kg)   SpO2 96%   BMI 46.17 kg/m  GEN- NAD, alert and oriented x3        Assessment & Plan:      Problem List Items Addressed This Visit      Unprioritized   Situational anxiety - Primary    Improved mood and anxiety in a stressful work enviroment No change to [employer]  as well If meds need to be adjusted due to weight loss surgery she will let me know F/U 3 months       Relevant Medications   busPIRone (BUSPAR) 7.5 MG tablet      Note: This dictation was prepared with Dragon dictation along with smaller phrase technology. Any transcriptional errors that result from this process are unintentional.

## 2020-01-11 NOTE — H&P (Signed)
Surgical Evaluation   HPI: Returns for follow up discussion of surgical management of severe obesity. Has several insightful questions today. Completed the pre-bariatric surgery pathway with no barriers identified.  DieticianGaylan Gerold 10/22/19: approved, behavioral issues require continued work Psych: Buena Irish 12/19/19: approved CXR/ UGI 11/12/19: negative, no HH, incidental duodenal diverticulum Labs: elevated triglycerides, high B12, otherwise unremarkable  Initial visit 09/06/19:  this is a very pleasant 39 year old woman who presents today to discuss two issues, primarily surgical management of severe obesity but additionally a right adrenal mass.    She has been struggling with obesity for the last 3-4 years.  Prior to this she did have issues with her weight but was able to lose 20-30 pounds with dieting and exercise although ultimately regaining it.  In the last few years she has struggled to lose even 5 after 2 months of continual effort.  She has tried several medications, diet and exercise programs, and follows with the noom program.  On general she endorses a well-balanced diet she does admit that she has issues with emotional eating and so her compliance with her intended lifestyle is inconsistent.  High carb or high-fat foods are difficult for her to avoid at times.  She is very active, she'll swim for 30-45 minutes or walk and this happens several times per week.  She has an 49-year-old at home.  She is a Chartered certified accountant, had been working in the emergency department until recently and just took a job working with [employer] fields in the outpatient setting.  She was diagnosed with Covid in August of last year and was admitted to the hospital for just over a week.  She did not have to be intubated.  Her course was complicated by developing a PE which required another several days in the hospital she has been back and forth the emergency room with various issues.  She was treated with  anticoagulation for 3 months but this has been stopped.  She has not had a duplex.   She does not recall any specific life events or changes that led her difficulty losing weight, although she does state that she had a couple of early miscarriages around the time that it began between difficult for her to lose weight.  As far as the adrenal mass, she was evaluated by Dr. Harlow Asa 2 months ago and her endocrinologist is Dr. Dorris Fetch, who referred her for consideration of surgery as well as evaluation of the right adrenal adenoma.  The adrenal adenoma was an incidental finding on a CT scan performed during an episode of diverticulitis, and measured 3 cm in 2018.  It has been followed with sequential CT scans most recent of which was in September 2020 and demonstrated the mass to measure 3.2 cm.  Radiology favors this to be a benign adenoma.  Laboratory studies favor that this is nonfunctional although patient states that the cortisol test was "a gray area ".  Of note, her recent bout with Covid did require treatment with 2-3 months of oral steroids.  She was supposed to have had another cortisol measurement in February although the only recent lab that she has from Dr. Liliane Channel office is a 24 hour urine creatinine.    Previous abdominal surgery includes laparoscopic cholecystectomy and cesarean section.  There is no family history of endocrine disease.  257lb/ BMI 46.26  OBESITY, MORBID, BMI 40.0-49.9 (E66.01) Story: She is a good candidate for sleeve gastrectomy. We discussed the surgery including technical aspects, the risks  of bleeding, infection, pain, scarring, injury to intra-abdominal structures, staple line leak or abscess, chronic abdominal pain or nausea, new onset or worsened GERD, DVT/PE, pneumonia, heart attack, stroke, death, failure to reach weight loss goals and weight regain, hernia. Discussed the typical pre-, peri-, and postoperative course. Discussed the importance of lifelong behavioral changes  to combat the chronic and relapsing disease which is obesity. Questions were welcomed and answered and we will initiate the bariatric pathway. I have sent an epigastric message to her endocrinologist so that we can confer about the plan. She will also need to have bilateral lower extremity duplexes done to ensure that there is no residual DVT prior to bariatric surgery. Although at this point it seems that the adrenal adenoma is nonfunctioning and in all likelihood is a benign adenoma with minimal growth in the last 2-1/2 years, I'll discuss further with Dr. Dorris Fetch. I do not think that this is driving her weight, and I would not recommend coupling her bariatric surgery with an adrenalectomy given the compounded risk involved- if adrenalectomy becomes indicated I would recommend a robotic/laparoscopic approach at a later date.   Allergies  Allergen Reactions   Ciprofloxacin     IV Only-Patient started sweating and becoming clammy when was infused with IV cipro on 08/09/14. 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[place])    Diverticulitis    Hypertension    Obese    Preeclampsia     Past Surgical History:  Procedure Laterality Date   CESAREAN SECTION     CHOLECYSTECTOMY     NASAL SEPTOPLASTY W/ TURBINOPLASTY N/A 07/27/2019   Procedure: NASAL SEPTOPLASTY WITH TURBINATE REDUCTION;  Surgeon: Leta Baptist, MD;  Location: [facility];  Service: ENT;  Laterality: N/A;   WISDOM TOOTH EXTRACTION      Family History  Problem Relation Age of Onset   Hypertension Father    Diabetes Father    Anxiety disorder Mother    Depression Mother    Cancer Sister        throat    Hypertension Paternal Grandmother    Hypertension Maternal Grandmother    Asthma Daughter    Cancer Maternal Aunt        Breast cancer    Social History   Socioeconomic History   Marital status: Single    Spouse name:  Not on file   Number of children: 1   Years of education: Not on file   Highest education level: Some college, no degree  Occupational History    Employer: [employer]  Tobacco Use   Smoking status: Never Smoker   Smokeless tobacco: Never Used  Scientific laboratory technician Use: Never used  Substance and Sexual Activity   Alcohol use: No   Drug use: No   Sexual activity: Yes    Birth control/protection: None  Other Topics Concern   Not on file  Social History Narrative   Lives in [place], [place].   Single, not dating.    Has one child, age 37, 50.   Has family in [place], [place].   Enjoys swimming.   Enjoys time with family.    Eats all foods.   Wears seatbelt.    Social Determinants of Health   Financial Resource Strain:    Difficulty of Paying Living Expenses:   Food Insecurity:    Worried About Charity fundraiser  in the Last Year:    Arboriculturist in the Last Year:   Transportation Needs:    Film/video editor (Medical):    Lack of Transportation (Non-Medical):   Physical Activity:    Days of Exercise per Week:    Minutes of Exercise per Session:   Stress:    Feeling of Stress :   Social Connections:    Frequency of Communication with Friends and Family:    Frequency of Social Gatherings with Friends and Family:    Attends Religious Services:    Active Member of Clubs or Organizations:    Attends Music therapist:    Marital Status:     Current Outpatient Medications on File Prior to Visit  Medication Sig Dispense Refill   albuterol (VENTOLIN HFA) 108 (90 Base) MCG/ACT inhaler Inhale 2 puffs into the lungs every 6 (six) hours as needed for wheezing or shortness of breath. 18 g 1   aspirin EC 81 MG tablet Take 81 mg by mouth daily.     atenolol (TENORMIN) 25 MG tablet Take 0.5 tablets (12.5 mg total) by mouth daily. (Patient taking differently: Take 12.5 mg by mouth daily as needed. ) 30 tablet 3   atomoxetine (STRATTERA) 40 MG capsule Take 1 capsule  (40 mg total) by mouth daily. (Patient not taking: Reported on 01/11/2020) 30 capsule 2   busPIRone (BUSPAR) 7.5 MG tablet Take 1 tablet (7.5 mg total) by mouth 2 (two) times daily. 60 tablet 3   cholecalciferol (VITAMIN D) 1000 units tablet Take 1,000 Units by mouth daily.      Cyanocobalamin (B-12) 1000 MCG TABS Take 1 tablet by mouth daily.      No current facility-administered medications on file prior to visit.    Review of Systems: a complete, 10pt review of systems was completed with pertinent positives and negatives as documented in the HPI  Physical Exam: Vitals Weight: 267.5 lb   Height: 62.5 in  Body Surface Area: 2.18 m   Body Mass Index: 48.15 kg/m   Temp.: 97.9 F    Pulse: 100 (Regular)    BP: 126/84(Sitting, Left Arm, Standard)  Alert and well appearing Unlabored respirations   CBC Latest Ref Rng & Units 07/18/2019 03/23/2019 03/13/2019  WBC 3.8 - 10.8 Thousand/uL 9.8 10.6(H) 8.1  Hemoglobin 11.7 - 15.5 g/dL 16.2(H) 13.7 15.1  Hematocrit 35 - 45 % 46.4(H) 41.4 43.1  Platelets 140 - 400 Thousand/uL 365 353 255    CMP Latest Ref Rng & Units 07/18/2019 03/23/2019 03/13/2019  Glucose 65 - 99 mg/dL 138(H) 147(H) 102(H)  BUN 7 - 25 mg/dL 15 12 13   Creatinine 0.50 - 1.10 mg/dL 0.98 0.84 1.11(H)  Sodium 135 - 146 mmol/L 141 139 141  Potassium 3.5 - 5.3 mmol/L 4.0 3.6 3.5  Chloride 98 - 110 mmol/L 104 107 106  CO2 20 - 32 mmol/L 27 20(L) 23  Calcium 8.6 - 10.2 mg/dL 10.4(H) 9.3 9.7  Total Protein 6.1 - 8.1 g/dL 7.0 7.0 6.8  Total Bilirubin 0.2 - 1.2 mg/dL 0.3 0.6 0.5  Alkaline Phos 38 - 126 U/L - 48 -  AST 10 - 30 U/L 19 25 29   ALT 6 - 29 U/L 23 43 64(H)    Lab Results  Component Value Date   INR 1.0 09/06/2019   INR 1.1 03/07/2019   INR 1.07 08/09/2014    Imaging: No results found.   A/P: OBESITY, MORBID, BMI 40.0-49.9 (E66.01) Story: She remains a good  candidate for sleeve gastrectomy. We have previously discussed the surgery including technical aspects,  the risks of bleeding, infection, pain, scarring, injury to intra-abdominal structures, staple line leak or abscess, chronic abdominal pain or nausea, new onset or worsened GERD, DVT/PE, pneumonia, heart attack, stroke, death, failure to reach weight loss goals and weight regain, hernia. Discussed the typical peri-, and postoperative course. Discussed the importance of lifelong behavioral changes to combat the chronic and relapsing disease which is obesity. Questions were welcomed and answered and she has completed the bariatric pathway with no barriers identified. Ready to proceed with surgery pending insurance and date. I communicated via EPIC with her endocrinologist Dr. Dorris Fetch who agrees with the plan and agrees adrenalectomy is not indicated at this time. At this point it seems that the adrenal adenoma is nonfunctioning and in all likelihood is a benign adenoma with minimal growth in the last 2-1/2 years. She did have BLE duplex negative for DVT as part of her bariatric workup.    Patient Active Problem List   Diagnosis Date Noted   Situational anxiety 01/11/2020   GERD (gastroesophageal reflux disease) 03/13/2019   Pulmonary embolism ([place]) 03/13/2019   History of COVID-19 03/07/2019   History of COVID -19 INFECTION 02/11/2019   Hypokalemia 02/11/2019   Abnormal liver function 02/11/2019   Pregnant 03/17/2018   H/O severe pre-eclampsia 03/17/2018   Previous cesarean section 03/17/2018   Tachycardia 03/17/2018   Chronic hypertension during pregnancy, antepartum 03/17/2018   ADD (attention deficit disorder) 08/22/2017   Cushings syndrome ([place]) 08/16/2017   Goiter 07/19/2017   Obesity 07/19/2017   Diverticulitis of colon 08/09/2014   Abdominal pain 08/09/2014   Adrenal adenoma, right 08/09/2014   Diverticulitis 08/09/2014   Elevated transaminase level 08/09/2014   Preeclampsia 10/17/2010   History of cholecystectomy 12/27/2002       Romana Juniper, MD [facility]  [place]  See AMION to contact appropriate on-call provider

## 2020-01-11 NOTE — Assessment & Plan Note (Signed)
Improved mood and anxiety in a stressful work enviroment No change to [employer]  as well If meds need to be adjusted due to weight loss surgery she will let me know F/U 3 months

## 2020-01-11 NOTE — H&P (View-Only) (Signed)
Surgical Evaluation   HPI: Returns for follow up discussion of surgical management of severe obesity. Has several insightful questions today. Completed the pre-bariatric surgery pathway with no barriers identified.  DieticianGaylan Gerold 10/22/19: approved, behavioral issues require continued work Psych: Buena Irish 12/19/19: approved CXR/ UGI 11/12/19: negative, no HH, incidental duodenal diverticulum Labs: elevated triglycerides, high B12, otherwise unremarkable  Initial visit 09/06/19:  this is a very pleasant 39 year old woman who presents today to discuss two issues, primarily surgical management of severe obesity but additionally a right adrenal mass.    She has been struggling with obesity for the last 3-4 years.  Prior to this she did have issues with her weight but was able to lose 20-30 pounds with dieting and exercise although ultimately regaining it.  In the last few years she has struggled to lose even 5 after 2 months of continual effort.  She has tried several medications, diet and exercise programs, and follows with the [facility] program.  On general she endorses a well-balanced diet she does admit that she has issues with emotional eating and so her compliance with her intended lifestyle is inconsistent.  High carb or high-fat foods are difficult for her to avoid at times.  She is very active, she'll swim for 30-45 minutes or walk and this happens several times per week.  She has an 93-year-old at home.  She is a Chartered certified accountant, had been working in the emergency department until recently and just took a job working with [employer] fields in the outpatient setting.  She was diagnosed with Covid in August of last year and was admitted to the hospital for just over a week.  She did not have to be intubated.  Her course was complicated by developing a PE which required another several days in the hospital she has been back and forth the emergency room with various issues.  She was treated with  anticoagulation for 3 months but this has been stopped.  She has not had a duplex.   She does not recall any specific life events or changes that led her difficulty losing weight, although she does state that she had a couple of early miscarriages around the time that it began between difficult for her to lose weight.  As far as the adrenal mass, she was evaluated by Dr. Harlow Asa 2 months ago and her endocrinologist is Dr. Dorris Fetch, who referred her for consideration of surgery as well as evaluation of the right adrenal adenoma.  The adrenal adenoma was an incidental finding on a CT scan performed during an episode of diverticulitis, and measured 3 cm in 2018.  It has been followed with sequential CT scans most recent of which was in September 2020 and demonstrated the mass to measure 3.2 cm.  Radiology favors this to be a benign adenoma.  Laboratory studies favor that this is nonfunctional although patient states that the cortisol test was "a gray area ".  Of note, her recent bout with Covid did require treatment with 2-3 months of oral steroids.  She was supposed to have had another cortisol measurement in February although the only recent lab that she has from Dr. Liliane Channel office is a 24 hour urine creatinine.    Previous abdominal surgery includes laparoscopic cholecystectomy and cesarean section.  There is no family history of endocrine disease.  257lb/ BMI 46.26  OBESITY, MORBID, BMI 40.0-49.9 (E66.01) Story: She is a good candidate for sleeve gastrectomy. We discussed the surgery including technical aspects, the risks  of bleeding, infection, pain, scarring, injury to intra-abdominal structures, staple line leak or abscess, chronic abdominal pain or nausea, new onset or worsened GERD, DVT/PE, pneumonia, heart attack, stroke, death, failure to reach weight loss goals and weight regain, hernia. Discussed the typical pre-, peri-, and postoperative course. Discussed the importance of lifelong behavioral changes  to combat the chronic and relapsing disease which is obesity. Questions were welcomed and answered and we will initiate the bariatric pathway. I have sent an epigastric message to her endocrinologist so that we can confer about the plan. She will also need to have bilateral lower extremity duplexes done to ensure that there is no residual DVT prior to bariatric surgery. Although at this point it seems that the adrenal adenoma is nonfunctioning and in all likelihood is a benign adenoma with minimal growth in the last 2-1/2 years, I'll discuss further with Dr. Dorris Fetch. I do not think that this is driving her weight, and I would not recommend coupling her bariatric surgery with an adrenalectomy given the compounded risk involved- if adrenalectomy becomes indicated I would recommend a robotic/laparoscopic approach at a later date.   Allergies  Allergen Reactions   Ciprofloxacin     IV Only-Patient started sweating and becoming clammy when was infused with IV cipro on 08/09/14. 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[place])    Diverticulitis    Hypertension    Obese    Preeclampsia     Past Surgical History:  Procedure Laterality Date   CESAREAN SECTION     CHOLECYSTECTOMY     NASAL SEPTOPLASTY W/ TURBINOPLASTY N/A 07/27/2019   Procedure: NASAL SEPTOPLASTY WITH TURBINATE REDUCTION;  Surgeon: Leta Baptist, MD;  Location: [facility];  Service: ENT;  Laterality: N/A;   WISDOM TOOTH EXTRACTION      Family History  Problem Relation Age of Onset   Hypertension Father    Diabetes Father    Anxiety disorder Mother    Depression Mother    Cancer Sister        throat    Hypertension Paternal Grandmother    Hypertension Maternal Grandmother    Asthma Daughter    Cancer Maternal Aunt        Breast cancer    Social History   Socioeconomic History   Marital status: Single    Spouse name:  Not on file   Number of children: 1   Years of education: Not on file   Highest education level: Some college, no degree  Occupational History    Employer: [employer]  Tobacco Use   Smoking status: Never Smoker   Smokeless tobacco: Never Used  Scientific laboratory technician Use: Never used  Substance and Sexual Activity   Alcohol use: No   Drug use: No   Sexual activity: Yes    Birth control/protection: None  Other Topics Concern   Not on file  Social History Narrative   Lives in [place], [place].   Single, not dating.    Has one child, age 39, 71.   Has family in [place], [place].   Enjoys swimming.   Enjoys time with family.    Eats all foods.   Wears seatbelt.    Social Determinants of Health   Financial Resource Strain:    Difficulty of Paying Living Expenses:   Food Insecurity:    Worried About Charity fundraiser  in the Last Year:    Arboriculturist in the Last Year:   Transportation Needs:    Film/video editor (Medical):    Lack of Transportation (Non-Medical):   Physical Activity:    Days of Exercise per Week:    Minutes of Exercise per Session:   Stress:    Feeling of Stress :   Social Connections:    Frequency of Communication with Friends and Family:    Frequency of Social Gatherings with Friends and Family:    Attends Religious Services:    Active Member of Clubs or Organizations:    Attends Music therapist:    Marital Status:     Current Outpatient Medications on File Prior to Visit  Medication Sig Dispense Refill   albuterol (VENTOLIN HFA) 108 (90 Base) MCG/ACT inhaler Inhale 2 puffs into the lungs every 6 (six) hours as needed for wheezing or shortness of breath. 18 g 1   aspirin EC 81 MG tablet Take 81 mg by mouth daily.     atenolol (TENORMIN) 25 MG tablet Take 0.5 tablets (12.5 mg total) by mouth daily. (Patient taking differently: Take 12.5 mg by mouth daily as needed. ) 30 tablet 3   atomoxetine (STRATTERA) 40 MG capsule Take 1 capsule  (40 mg total) by mouth daily. (Patient not taking: Reported on 01/11/2020) 30 capsule 2   busPIRone (BUSPAR) 7.5 MG tablet Take 1 tablet (7.5 mg total) by mouth 2 (two) times daily. 60 tablet 3   cholecalciferol (VITAMIN D) 1000 units tablet Take 1,000 Units by mouth daily.      Cyanocobalamin (B-12) 1000 MCG TABS Take 1 tablet by mouth daily.      No current facility-administered medications on file prior to visit.    Review of Systems: a complete, 10pt review of systems was completed with pertinent positives and negatives as documented in the HPI  Physical Exam: Vitals Weight: 267.5 lb   Height: 62.5 in  Body Surface Area: 2.18 m   Body Mass Index: 48.15 kg/m   Temp.: 97.9 F    Pulse: 100 (Regular)    BP: 126/84(Sitting, Left Arm, Standard)  Alert and well appearing Unlabored respirations   CBC Latest Ref Rng & Units 07/18/2019 03/23/2019 03/13/2019  WBC 3.8 - 10.8 Thousand/uL 9.8 10.6(H) 8.1  Hemoglobin 11.7 - 15.5 g/dL 16.2(H) 13.7 15.1  Hematocrit 35 - 45 % 46.4(H) 41.4 43.1  Platelets 140 - 400 Thousand/uL 365 353 255    CMP Latest Ref Rng & Units 07/18/2019 03/23/2019 03/13/2019  Glucose 65 - 99 mg/dL 138(H) 147(H) 102(H)  BUN 7 - 25 mg/dL 15 12 13   Creatinine 0.50 - 1.10 mg/dL 0.98 0.84 1.11(H)  Sodium 135 - 146 mmol/L 141 139 141  Potassium 3.5 - 5.3 mmol/L 4.0 3.6 3.5  Chloride 98 - 110 mmol/L 104 107 106  CO2 20 - 32 mmol/L 27 20(L) 23  Calcium 8.6 - 10.2 mg/dL 10.4(H) 9.3 9.7  Total Protein 6.1 - 8.1 g/dL 7.0 7.0 6.8  Total Bilirubin 0.2 - 1.2 mg/dL 0.3 0.6 0.5  Alkaline Phos 38 - 126 U/L - 48 -  AST 10 - 30 U/L 19 25 29   ALT 6 - 29 U/L 23 43 64(H)    Lab Results  Component Value Date   INR 1.0 09/06/2019   INR 1.1 03/07/2019   INR 1.07 08/09/2014    Imaging: No results found.   A/P: OBESITY, MORBID, BMI 40.0-49.9 (E66.01) Story: She remains a good  candidate for sleeve gastrectomy. We have previously discussed the surgery including technical aspects,  the risks of bleeding, infection, pain, scarring, injury to intra-abdominal structures, staple line leak or abscess, chronic abdominal pain or nausea, new onset or worsened GERD, DVT/PE, pneumonia, heart attack, stroke, death, failure to reach weight loss goals and weight regain, hernia. Discussed the typical peri-, and postoperative course. Discussed the importance of lifelong behavioral changes to combat the chronic and relapsing disease which is obesity. Questions were welcomed and answered and she has completed the bariatric pathway with no barriers identified. Ready to proceed with surgery pending insurance and date. I communicated via EPIC with her endocrinologist Dr. Dorris Fetch who agrees with the plan and agrees adrenalectomy is not indicated at this time. At this point it seems that the adrenal adenoma is nonfunctioning and in all likelihood is a benign adenoma with minimal growth in the last 2-1/2 years. She did have BLE duplex negative for DVT as part of her bariatric workup.    Patient Active Problem List   Diagnosis Date Noted   Situational anxiety 01/11/2020   GERD (gastroesophageal reflux disease) 03/13/2019   Pulmonary embolism ([place]) 03/13/2019   History of COVID-19 03/07/2019   History of COVID -19 INFECTION 02/11/2019   Hypokalemia 02/11/2019   Abnormal liver function 02/11/2019   Pregnant 03/17/2018   H/O severe pre-eclampsia 03/17/2018   Previous cesarean section 03/17/2018   Tachycardia 03/17/2018   Chronic hypertension during pregnancy, antepartum 03/17/2018   ADD (attention deficit disorder) 08/22/2017   Cushings syndrome ([place]) 08/16/2017   Goiter 07/19/2017   Obesity 07/19/2017   Diverticulitis of colon 08/09/2014   Abdominal pain 08/09/2014   Adrenal adenoma, right 08/09/2014   Diverticulitis 08/09/2014   Elevated transaminase level 08/09/2014   Preeclampsia 10/17/2010   History of cholecystectomy 12/27/2002       Romana Juniper, MD [facility]  [place]  See AMION to contact appropriate on-call provider

## 2020-01-14 ENCOUNTER — Encounter: Payer: No Typology Code available for payment source | Attending: Surgery | Admitting: Skilled Nursing Facility1

## 2020-01-14 ENCOUNTER — Other Ambulatory Visit: Payer: Self-pay

## 2020-01-14 DIAGNOSIS — E669 Obesity, unspecified: Secondary | ICD-10-CM | POA: Insufficient documentation

## 2020-01-14 MED FILL — busPIRone HCL 7.5 MG TABS: 7.5 | 30 days supply | Qty: 60 | Fill #0

## 2020-01-14 NOTE — Progress Notes (Signed)
Pre-Operative Nutrition Class:  Appt start time: 5207   End time:  1830.  Patient was seen on 01/14/2020 for Pre-Operative Bariatric Surgery Education at [facility].    Surgery date:  Surgery type: sleeve [facility] at [facility]: 261 Weight today: pt arrived too late   The following the learning objectives were met by the patient during this course:  Identify Pre-Op Dietary Goals and will begin 2 weeks pre-operatively  Identify appropriate sources of fluids and proteins   State protein recommendations and appropriate sources pre and post-operatively  Identify Post-Operative Dietary Goals and will follow for 2 weeks post-operatively  Identify appropriate multivitamin and calcium sources  Describe the need for physical activity post-operatively and will follow MD recommendations  State when to call healthcare provider regarding medication questions or post-operative complications  Handouts given during class include:  Pre-Op Bariatric Surgery Diet Handout  Protein Shake Handout  Post-Op Bariatric Surgery Nutrition Handout  [facility] Program Information Flyer  Support Group Information Flyer  [facility] Bariatric Supplements Price List  Follow-Up Plan: Patient will follow-up at [facility] 2 weeks post operatively for diet advancement per MD.

## 2020-01-17 ENCOUNTER — Other Ambulatory Visit: Payer: Self-pay

## 2020-01-17 ENCOUNTER — Encounter: Payer: Self-pay | Admitting: "Endocrinology

## 2020-01-17 ENCOUNTER — Ambulatory Visit (INDEPENDENT_AMBULATORY_CARE_PROVIDER_SITE_OTHER): Payer: No Typology Code available for payment source | Admitting: "Endocrinology

## 2020-01-17 VITALS — BP 120/83 | HR 80 | Ht 64.0 in | Wt 268.0 lb

## 2020-01-17 DIAGNOSIS — D3501 Benign neoplasm of right adrenal gland: Secondary | ICD-10-CM

## 2020-01-17 NOTE — Progress Notes (Signed)
01/17/2020, 5:04 PM               Endocrinology follow-up note   Subjective:    Patient ID: [name], female    DOB: May 07, 1981, PCP Alycia Rossetti, MD   Past Medical History:  Diagnosis Date  . COVID-19 virus IgG antibody detected   . 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 Diverticulitis   . Hypertension   . Obese   . Preeclampsia    Past Surgical History:  Procedure Laterality Date  . CESAREAN SECTION    . CHOLECYSTECTOMY    . NASAL SEPTOPLASTY W/ TURBINOPLASTY N/A 07/27/2019   Procedure: NASAL SEPTOPLASTY WITH TURBINATE REDUCTION;  Surgeon: Leta Baptist, MD;  Location: [facility];  Service: ENT;  Laterality: N/A;  . WISDOM TOOTH EXTRACTION     Social History   Socioeconomic History  . Marital status: Single    Spouse name: Not on file  . Number of children: 1  . Years of education: Not on file  . Highest education level: Some college, no degree  Occupational History    Employer: [employer]  Tobacco Use  . Smoking status: Never Smoker  . Smokeless tobacco: Never Used  Vaping Use  . Vaping Use: Never used  Substance and Sexual Activity  . Alcohol use: No  . Drug use: No  . Sexual activity: Yes    Birth control/protection: None  Other Topics Concern  . Not on file  Social History Narrative   Lives in [place], [place].   Single, not dating.    Has one child, age 73, 64.   Has family in [place], [place].   Enjoys swimming.   Enjoys time with family.    Eats all foods.   Wears seatbelt.    Social Determinants of Health   Financial Resource Strain:   . Difficulty of Paying Living Expenses:   Food Insecurity:   . Worried About Charity fundraiser in the Last Year:   . Arboriculturist in the Last Year:   Transportation Needs:   . Film/video editor (Medical):   Marland Kitchen Lack of Transportation (Non-Medical):   Physical Activity:   . Days of Exercise per Week:   . Minutes of Exercise per Session:    Stress:   . Feeling of Stress :   Social Connections:   . Frequency of Communication with Friends and Family:   . Frequency of Social Gatherings with Friends and Family:   . Attends Religious Services:   . Active Member of Clubs or Organizations:   . Attends Archivist Meetings:   Marland Kitchen Marital Status:    Outpatient Encounter Medications as of 01/17/2020  Medication Sig  . albuterol (VENTOLIN HFA) 108 (90 Base) MCG/ACT inhaler Inhale 2 puffs into the lungs every 6 (six) hours as needed for wheezing or shortness of breath.  Marland Kitchen aspirin EC 81 MG tablet Take 81 mg by mouth daily.  Marland Kitchen atenolol (TENORMIN) 25 MG tablet Take 0.5 tablets (12.5 mg total) by mouth daily. (Patient taking differently: Take 12.5 mg by mouth daily as needed. )  . atomoxetine (STRATTERA) 40 MG capsule Take 1 capsule (40 mg total) by mouth daily. (Patient not taking: Reported on  01/11/2020)  . busPIRone (BUSPAR) 7.5 MG tablet Take 1 tablet (7.5 mg total) by mouth 2 (two) times daily.  . cholecalciferol (VITAMIN D) 1000 units tablet Take 1,000 Units by mouth daily.   . Cyanocobalamin (B-12) 1000 MCG TABS Take 1 tablet by mouth daily.    No facility-administered encounter medications on file as of 01/17/2020.   ALLERGIES: Allergies  Allergen Reactions  . Ciprofloxacin     IV Only-Patient started sweating and becoming clammy when was infused with IV cipro on 08/09/14. Can tolerate PO Ciprofloxacin     VACCINATION STATUS: Immunization History  Administered Date(s) Administered  . Influenza,inj,Quad PF,6+ Mos 04/26/2019    HPI STARKEISHA VANWINKLE is 39 y.o. female who presents today with a medical history as above. she is being seen in follow up for right adrenal adenoma . - Her history starts in February 2016 when she underwent CT scan of her abdomen due to left lower quadrant pain which incidentally showed 2.7 cm mass  on her right adrenal gland which was reported to be consistent with adenoma. - Repeat CT abdomen  in November 2018 revealed that the adenoma persisted and measured 3 cm ( HU 0) still favoring adenoma. -More recently on March 24, 2019, she underwent CT angiogram related to work-up in the hospital when she was admitted for COVID-19, the right adrenal adenoma was found to be measuring 3.2cm. -She was exposed to large amounts of steroids during the course of her illness including dexamethasone. -Previously ,she underwent hormonal work-up to determine functional status of this adenoma.  Findings have not been conclusive, except for one instance of 24-hour urine free cortisol marginally elevated at 59. -Her subsequent functional studies were within normal limits, did not require definitive intervention and was put on observation with plan to repeat plasma free metanephrines, A1c, and thyroid function tests.  These are all within normal limits.  -Before this visit, she underwent 1 mg overnight dexamethasone suppression test with the next morning serum cortisol being 3.4 ng per DL. -Her ongoing major medical problem has been obesity.  She is in the process of clearance for sleeve gastrectomy.  She is awaiting for her surgery date, tentatively in August 2021.  She has no new complaints today.  - She has history of prediabetes, hypertension which required 1 medication with atenolol 25 mg by mouth daily.  - She has 1 child, 58 years of age. - She denies diaphoresis, uncontrolled hypertension, headaches.  Review of Systems  Limited as above.  Objective:    BP 120/83   Pulse 80   Ht 5\' 4"  (1.626 m)   Wt (!) 268 lb (121.6 kg)   BMI 46.00 kg/m   Wt Readings from Last 3 Encounters:  01/17/20 (!) 268 lb (121.6 kg)  01/11/20 269 lb (122 kg)  12/21/19 269 lb (122 kg)    Physical Exam   CMP ( most recent) CMP     Component Value Date/Time   NA 141 07/18/2019 1505   K 4.0 07/18/2019 1505   CL 104 07/18/2019 1505   CO2 27 07/18/2019 1505   GLUCOSE 138 (H) 07/18/2019 1505   BUN 15  07/18/2019 1505   CREATININE 0.98 07/18/2019 1505   CALCIUM 10.4 (H) 07/18/2019 1505   PROT 7.0 07/18/2019 1505   ALBUMIN 3.9 03/23/2019 2120   AST 19 07/18/2019 1505   ALT 23 07/18/2019 1505   ALKPHOS 48 03/23/2019 2120   BILITOT 0.3 07/18/2019 1505   GFRNONAA >60 03/23/2019 2120  GFRNONAA 64 04/18/2017 1041   GFRAA >60 03/23/2019 2120   GFRAA 74 04/18/2017 1041     Diabetic Labs (most recent): Lab Results  Component Value Date   HGBA1C 5.4 07/18/2019   HGBA1C 5.9 (H) 02/16/2019   HGBA1C 5.6 12/13/2018     Lipid Panel ( most recent) Lipid Panel     Component Value Date/Time   CHOL 147 05/24/2018 1133   TRIG 134 03/06/2019 2132   HDL 43 (L) 05/24/2018 1133   CHOLHDL 3.4 05/24/2018 1133   LDLCALC 84 05/24/2018 1133       Lab Results  Component Value Date   TSH 2.620 12/13/2018   TSH 1.83 04/18/2017   FREET4 1.06 12/13/2018    Results for AMMARA, RAJ (MRN [id number]) as of 05/17/2019 17:11  Ref. Range 08/23/2017 09:04 12/13/2018 10:47 05/14/2019 07:57  ACTH Latest Ref Range: 7.2 - 63.3 pg/mL 11.9    Cortisol - AM Overnight Dex Supp Latest Units: mcg/dL 6.8  3.4 (L)  Metanephrine, Pl Latest Ref Range: 0.0 - 88.0 pg/mL  <10.0   Normetanephrine, Pl Latest Ref Range: 0.0 - 110.1 pg/mL  42.9     Most recent 24-hour urine free cortisol on November 28, 2017 was 14 improving from 59.  05/10/2017   CT abdomen  Radiology description of her right adrenal mass: Adrenal: Previously noted right adrenal mass is similar to the prior examination measuring 3.0 x 2.6 cm and is diffusely low-attenuation (0 HU), compatible with an adenoma. Left adrenal gland and bilateral kidneys are normal in appearance. No hydroureteronephrosis in the visualized portions of the abdomen.   March 24, 2019, 3.2 cm right adrenal adenoma redemonstrated.   Assessment & Plan:   1. Adrenal adenoma, right  -3.2cm slowly growing stable adenoma.  Radiologic features favoring benign  adenoma. - a series of functional studies have been normal-unremarkable.  Patient has difficulty losing weight, see below.  Her most recent overnight dexamethasone suppression test did not exclude Cushing syndrome.  She did have normal plasma metanephrines in 2019.     Her immediate concern is her weight problem, see below.  After her possible bariatric surgery for weight loss, she will be considered for repeat work-up and surgical excision of this adrenal adenoma if necessary.  2) morbid obesity -This is likely unrelated to her adrenal adenoma.  This is her major health concern and she wishes to address it first.  She did have A1c of 5.9% in August 2020 consistent with prediabetes, subsequently improved to 5.4%.  She is not on any medication intervention for this.   -She is being cleared for bariatric surgery, tentatively in August 2021 according to her report.  She is encouraged to maintain close follow-up with your bariatric team.  - she  admits there is a room for improvement in her diet and drink choices. -  Suggestion is made for her to avoid simple carbohydrates  from her diet including Cakes, Sweet Desserts / Pastries, Ice Cream, Soda (diet and regular), Sweet Tea, Candies, Chips, Cookies, Sweet Pastries,  Store Bought Juices, Alcohol in Excess of  1-2 drinks a day, Artificial Sweeteners, Coffee Creamer, and "Sugar-free" Products. This will help patient to have stable blood glucose profile and potentially avoid unintended weight gain.    3. Goiter - She does not have family history of thyroid cancer.  She has significant thyromegaly with no discrete nodules, euthyroid based on her thyroid function tests, no intervention at this time.     - I advised patient  to maintain close follow up with Alycia Rossetti, MD for primary care needs.    - Time spent on this patient care encounter:  20 minutes of which 50% was spent in  counseling and the rest reviewing  her current and  previous labs  / studies and medications  doses and developing a plan for long term care. Meta Hatchet Osuch  participated in the discussions, expressed understanding, and voiced agreement with the above plans.  All questions were answered to her satisfaction. she is encouraged to contact clinic should she have any questions or concerns prior to her return visit.    Follow up plan: Return in about 3 months (around 04/18/2020) for F/U with no Labs.   Glade Lloyd, MD [facility] [address] [place], [place] [address] Phone: [phone]  Fax: [phone]     01/17/2020, 5:04 PM  This note was partially dictated with voice recognition software. Similar sounding words can be transcribed inadequately or may not  be corrected upon review.

## 2020-01-25 NOTE — Progress Notes (Addendum)
COVID Vaccine Completed:  No Date COVID Vaccine completed: COVID vaccine manufacturer: [employer]   PCP - Dr. Vic Blackbird Cardiologist - Dr. Kate Sable  Last seen 02-22-18  Chest x-ray - 11-12-19 & CT Chest 03-23-19 EKG - 11-19-19 Stress Test - N/A ECHO - 03-07-19 Cardiac Cath - N/A  Sleep Study - N/A CPAP -   Fasting Blood Sugar - N/A Checks Blood Sugar _____ times a day  Blood Thinner Instructions: Aspirin Instructions: ASA 81 mg.  Hold x7 days Last Dose: 01-27-20  Anesthesia review:  Hx of tachycardia, palpitations, PE r/t to Covid and hx of Covid pneumonia  Patient denies shortness of breath, fever, cough and chest pain at PAT appointment   Patient verbalized understanding of instructions that were given to them at the PAT appointment. Patient was also instructed that they will need to review over the PAT instructions again at home before surgery.

## 2020-01-25 NOTE — Patient Instructions (Addendum)
DUE TO COVID-19 ONLY ONE VISITOR ARE ALLOWED TO COME WITH YOU AND STAY IN THE WAITING ROOM ONLY DURING PRE OP AND PROCEDURE. THEN  ONE VISITORS MAY VISIT WITH YOU IN YOUR PRIVATE ROOM DURING VISITING HOURS ONLY!! (10AM-8PM)   COVID SWAB TESTING MUST BE COMPLETED ON:     Friday, 02-01-20 @ 2:35      [address] [place], [place] [address]  (Must self quarantine after testing. Follow instructions on handout.)        Your procedure is scheduled on:  Tuesday, 02-05-20   Report to [facility] Main  Entrance   Report to Short Stay at 5:30 AM   [facility])    Call this number if you have problems the morning of surgery [phone]   Do not eat food :After Midnight.   May have liquids until 4:30 AM   day of surgery   CLEAR LIQUID DIET  Foods Allowed                                                                     Foods Excluded  Water, Black Coffee and tea, regular and decaf            liquids that you cannot  Plain Jell-O in any flavor  (No red)                                  see through such as: Fruit ices (not with fruit pulp)                                      milk, soups, orange juice              Iced Popsicles (No red)                                      All solid food                                   Apple juices Sports drinks like Gatorade (No red) Lightly seasoned clear broth or consume(fat free) Sugar, honey syrup  Sample Menu Breakfast                                Lunch                                     Supper Cranberry juice                    Beef broth                            Chicken broth Jell-O  Grape juice                           Apple juice Coffee or tea                        Jell-O                                      Popsicle                                                  Coffee or tea                        Coffee or tea      Oral Hygiene is also important to reduce your risk of infection.                                     Remember - BRUSH YOUR TEETH THE MORNING OF SURGERY WITH YOUR REGULAR TOOTHPASTE   Do NOT smoke after Midnight   Take these medicines the morning of surgery with A SIP OF WATER:  Atenolol, Buspar, Zyrtec                                You may not have any metal on your body including hair pins, jewelry, and body piercings              Do not wear make-up, lotions, powders, perfumes or deodorant               Do not wear nail polish.  Do not shave  48 hours prior to surgery.             Do not bring valuables to the hospital. [place].   Contacts, dentures or bridgework may not be worn into surgery.   Bring small overnight bag day of surgery.    Special Instructions: Bring a copy of your healthcare power of attorney and living will documents the day of surgery if you haven't scanned them in before.              Please read over the following fact sheets you were given: IF YOU HAVE QUESTIONS ABOUT YOUR PRE OP INSTRUCTIONS PLEASE CALL [phone]   [facility] - Preparing for Surgery Before surgery, you can play an important role.  Because skin is not sterile, your skin needs to be as free of germs as possible.  You can reduce the number of germs on your skin by washing with CHG (chlorahexidine gluconate) soap before surgery.  CHG is an antiseptic cleaner which kills germs and bonds with the skin to continue killing germs even after washing. Please DO NOT use if you have an allergy to CHG or antibacterial soaps.  If your skin becomes reddened/irritated stop using the CHG and inform your nurse when you arrive at Short Stay. Do not shave (including legs and underarms) for at least 48 hours prior to the first CHG shower.  You may shave your face/neck.  Please follow these instructions carefully:  1.  Shower with CHG Soap the night before surgery and the  morning of surgery.  2.  If you choose to wash your hair, wash your  hair first as usual with your normal  shampoo.  3.  After you shampoo, rinse your hair and body thoroughly to remove the shampoo.                             4.  Use CHG as you would any other liquid soap.  You can apply chg directly to the skin and wash.  Gently with a scrungie or clean washcloth.  5.  Apply the CHG Soap to your body ONLY FROM THE NECK DOWN.   Do   not use on face/ open                           Wound or open sores. Avoid contact with eyes, ears mouth and   genitals (private parts).                       Wash face,  Genitals (private parts) with your normal soap.             6.  Wash thoroughly, paying special attention to the area where your    surgery  will be performed.  7.  Thoroughly rinse your body with warm water from the neck down.  8.  DO NOT shower/wash with your normal soap after using and rinsing off the CHG Soap.                9.  Pat yourself dry with a clean towel.            10.  Wear clean pajamas.            11.  Place clean sheets on your bed the night of your first shower and do not  sleep with pets. Day of Surgery : Do not apply any lotions/deodorants the morning of surgery.  Please wear clean clothes to the hospital/surgery center.  FAILURE TO FOLLOW THESE INSTRUCTIONS MAY RESULT IN THE CANCELLATION OF YOUR SURGERY  PATIENT SIGNATURE_________________________________  NURSE SIGNATURE__________________________________  ________________________________________________________________________

## 2020-02-01 ENCOUNTER — Other Ambulatory Visit (HOSPITAL_COMMUNITY)
Admission: RE | Admit: 2020-02-01 | Discharge: 2020-02-01 | Disposition: A | Discharge status: Home / Self Care | Payer: No Typology Code available for payment source | Source: Ambulatory Visit | Attending: Surgery | Admitting: Surgery

## 2020-02-01 ENCOUNTER — Other Ambulatory Visit (HOSPITAL_COMMUNITY): Payer: No Typology Code available for payment source

## 2020-02-01 ENCOUNTER — Other Ambulatory Visit: Payer: Self-pay

## 2020-02-01 ENCOUNTER — Encounter (HOSPITAL_COMMUNITY)
Admission: RE | Admit: 2020-02-01 | Discharge: 2020-02-01 | Disposition: A | Discharge status: Home / Self Care | Payer: No Typology Code available for payment source | Source: Ambulatory Visit | Attending: Surgery | Admitting: Surgery

## 2020-02-01 ENCOUNTER — Encounter (HOSPITAL_COMMUNITY): Payer: Self-pay

## 2020-02-01 DIAGNOSIS — Z20822 Contact with and (suspected) exposure to covid-19: Secondary | ICD-10-CM | POA: Insufficient documentation

## 2020-02-01 DIAGNOSIS — Z01812 Encounter for preprocedural laboratory examination: Secondary | ICD-10-CM | POA: Insufficient documentation

## 2020-02-01 HISTORY — DX: Gastro-esophageal reflux disease without esophagitis: K21.9

## 2020-02-01 HISTORY — DX: Other pulmonary embolism without acute cor pulmonale: I26.99

## 2020-02-01 HISTORY — DX: Pneumonia, unspecified organism: J18.9

## 2020-02-01 LAB — CBC WITH DIFFERENTIAL/PLATELET
Abs Immature Granulocytes: 0.07 10*3/uL (ref 0.00–0.07)
Basophils Absolute: 0.1 10*3/uL (ref 0.0–0.1)
Basophils Relative: 1 %
Eosinophils Absolute: 0.1 10*3/uL (ref 0.0–0.5)
Eosinophils Relative: 1 %
HCT: 43.6 % (ref 36.0–46.0)
Hemoglobin: 14.7 g/dL (ref 12.0–15.0)
Immature Granulocytes: 1 %
Lymphocytes Relative: 27 %
Lymphs Abs: 2.8 10*3/uL (ref 0.7–4.0)
MCH: 31.1 pg (ref 26.0–34.0)
MCHC: 33.7 g/dL (ref 30.0–36.0)
MCV: 92.4 fL (ref 80.0–100.0)
Monocytes Absolute: 0.7 10*3/uL (ref 0.1–1.0)
Monocytes Relative: 7 %
Neutro Abs: 6.6 10*3/uL (ref 1.7–7.7)
Neutrophils Relative %: 63 %
Platelets: 364 10*3/uL (ref 150–400)
RBC: 4.72 MIL/uL (ref 3.87–5.11)
RDW: 13.2 % (ref 11.5–15.5)
WBC: 10.3 10*3/uL (ref 4.0–10.5)
nRBC: 0 % (ref 0.0–0.2)

## 2020-02-01 LAB — COMPREHENSIVE METABOLIC PANEL
ALT: 35 U/L (ref 0–44)
AST: 26 U/L (ref 15–41)
Albumin: 4.2 g/dL (ref 3.5–5.0)
Alkaline Phosphatase: 59 U/L (ref 38–126)
Anion gap: 7 (ref 5–15)
BUN: 20 mg/dL (ref 6–20)
CO2: 26 mmol/L (ref 22–32)
Calcium: 9.4 mg/dL (ref 8.9–10.3)
Chloride: 107 mmol/L (ref 98–111)
Creatinine, Ser: 1.2 mg/dL — ABNORMAL HIGH (ref 0.44–1.00)
GFR calc Af Amer: >60 mL/min (ref >60)
GFR calc non Af Amer: 57 mL/min — ABNORMAL LOW (ref >60)
Glucose, Bld: 98 mg/dL (ref 70–99)
Potassium: 3.8 mmol/L (ref 3.5–5.1)
Sodium: 140 mmol/L (ref 135–145)
Total Bilirubin: 0.8 mg/dL (ref 0.3–1.2)
Total Protein: 7.5 g/dL (ref 6.5–8.1)

## 2020-02-01 LAB — SARS CORONAVIRUS 2 (TAT 6-24 HRS): SARS Coronavirus 2: NEGATIVE

## 2020-02-04 MED ORDER — BUPIVACAINE LIPOSOME 1.3 % IJ SUSP
20.0000 mL | Freq: Once | INTRAMUSCULAR | Status: DC
  Filled 2020-02-04: qty 20

## 2020-02-05 ENCOUNTER — Inpatient Hospital Stay (HOSPITAL_COMMUNITY): Payer: No Typology Code available for payment source | Admitting: Certified Registered Nurse Anesthetist

## 2020-02-05 ENCOUNTER — Encounter (HOSPITAL_COMMUNITY): Payer: Self-pay | Admitting: Surgery

## 2020-02-05 ENCOUNTER — Inpatient Hospital Stay (HOSPITAL_COMMUNITY)
Admission: RE | Admit: 2020-02-05 | Discharge: 2020-02-07 | DRG: 620 | Disposition: A | Discharge status: Home / Self Care | Payer: No Typology Code available for payment source | Attending: Surgery | Admitting: Surgery

## 2020-02-05 ENCOUNTER — Encounter (HOSPITAL_COMMUNITY)
Admission: RE | Disposition: A | Discharge status: Home / Self Care | Payer: Self-pay | Source: Home / Self Care | Attending: Surgery

## 2020-02-05 ENCOUNTER — Inpatient Hospital Stay (HOSPITAL_COMMUNITY): Payer: No Typology Code available for payment source | Admitting: Physician Assistant

## 2020-02-05 DIAGNOSIS — Z79899 Other long term (current) drug therapy: Secondary | ICD-10-CM | POA: Diagnosis not present

## 2020-02-05 DIAGNOSIS — I10 Essential (primary) hypertension: Secondary | ICD-10-CM | POA: Diagnosis present

## 2020-02-05 DIAGNOSIS — D3501 Benign neoplasm of right adrenal gland: Secondary | ICD-10-CM | POA: Diagnosis present

## 2020-02-05 DIAGNOSIS — Z8616 Personal history of COVID-19: Secondary | ICD-10-CM | POA: Diagnosis not present

## 2020-02-05 DIAGNOSIS — E249 Cushing's syndrome, unspecified: Secondary | ICD-10-CM | POA: Diagnosis present

## 2020-02-05 DIAGNOSIS — E669 Obesity, unspecified: Secondary | ICD-10-CM | POA: Diagnosis present

## 2020-02-05 DIAGNOSIS — E66812 Obesity, class 2: Secondary | ICD-10-CM | POA: Diagnosis present

## 2020-02-05 DIAGNOSIS — Z6841 Body Mass Index (BMI) 40.0 and over, adult: Secondary | ICD-10-CM

## 2020-02-05 DIAGNOSIS — Z881 Allergy status to other antibiotic agents status: Secondary | ICD-10-CM

## 2020-02-05 DIAGNOSIS — Z8249 Family history of ischemic heart disease and other diseases of the circulatory system: Secondary | ICD-10-CM | POA: Diagnosis not present

## 2020-02-05 DIAGNOSIS — Z7982 Long term (current) use of aspirin: Secondary | ICD-10-CM

## 2020-02-05 HISTORY — PX: LAPAROSCOPIC GASTRIC SLEEVE RESECTION: SHX5895

## 2020-02-05 HISTORY — PX: UPPER GI ENDOSCOPY: SHX6162

## 2020-02-05 LAB — TYPE AND SCREEN
ABO/RH(D): A POS
Antibody Screen: NEGATIVE

## 2020-02-05 LAB — PREGNANCY, URINE: Preg Test, Ur: NEGATIVE

## 2020-02-05 SURGERY — GASTRECTOMY, SLEEVE, LAPAROSCOPIC
Anesthesia: General | Site: Esophagus

## 2020-02-05 MED ORDER — LIDOCAINE 2% (20 MG/ML) 5 ML SYRINGE
INTRAMUSCULAR | Status: AC
  Filled 2020-02-05: qty 5

## 2020-02-05 MED ORDER — DOCUSATE SODIUM 100 MG PO CAPS
100.0000 mg | ORAL_CAPSULE | Freq: Two times a day (BID) | ORAL | Status: DC
  Administered 2020-02-05 – 2020-02-06 (×3): 100 mg via ORAL
  Filled 2020-02-05 (×3): qty 1

## 2020-02-05 MED ORDER — KETAMINE HCL 10 MG/ML IJ SOLN
INTRAMUSCULAR | Status: AC
  Filled 2020-02-05: qty 1

## 2020-02-05 MED ORDER — HYDROMORPHONE HCL 1 MG/ML IJ SOLN
INTRAMUSCULAR | Status: AC
  Filled 2020-02-05: qty 1

## 2020-02-05 MED ORDER — CHLORHEXIDINE GLUCONATE 0.12 % MT SOLN: 15.0000 mL | Freq: Once | OROMUCOSAL | Status: AC

## 2020-02-05 MED ORDER — HEPARIN SODIUM (PORCINE) 5000 UNIT/ML IJ SOLN
5000.0000 [IU] | INTRAMUSCULAR | Status: AC
  Administered 2020-02-05: 5000 [IU] via SUBCUTANEOUS
  Filled 2020-02-05: qty 1

## 2020-02-05 MED ORDER — SODIUM CHLORIDE 0.9 % IV SOLN
2.0000 g | INTRAVENOUS | Status: AC
  Administered 2020-02-05: 2 g via INTRAVENOUS
  Filled 2020-02-05: qty 2

## 2020-02-05 MED ORDER — GABAPENTIN 100 MG PO CAPS
200.0000 mg | ORAL_CAPSULE | Freq: Two times a day (BID) | ORAL | Status: DC
  Administered 2020-02-05 – 2020-02-06 (×3): 200 mg via ORAL
  Filled 2020-02-05 (×3): qty 2

## 2020-02-05 MED ORDER — ROCURONIUM BROMIDE 10 MG/ML (PF) SYRINGE
PREFILLED_SYRINGE | INTRAVENOUS | Status: AC
  Filled 2020-02-05: qty 10

## 2020-02-05 MED ORDER — DEXAMETHASONE SODIUM PHOSPHATE 10 MG/ML IJ SOLN
INTRAMUSCULAR | Status: DC | PRN
  Administered 2020-02-05: 5 mg via INTRAVENOUS

## 2020-02-05 MED ORDER — ATENOLOL 25 MG PO TABS
12.5000 mg | ORAL_TABLET | Freq: Every day | ORAL | Status: DC
  Filled 2020-02-05: qty 1

## 2020-02-05 MED ORDER — HYDROMORPHONE HCL 1 MG/ML IJ SOLN: 0.5000 mg | INTRAMUSCULAR | Status: DC | PRN

## 2020-02-05 MED ORDER — ONDANSETRON HCL 4 MG/2ML IJ SOLN
INTRAMUSCULAR | Status: AC
  Filled 2020-02-05: qty 2

## 2020-02-05 MED ORDER — SCOPOLAMINE 1 MG/3DAYS TD PT72
1.0000 | MEDICATED_PATCH | TRANSDERMAL | Status: DC
  Administered 2020-02-05: 1.5 mg via TRANSDERMAL
  Filled 2020-02-05: qty 1

## 2020-02-05 MED ORDER — EPHEDRINE 5 MG/ML INJ
INTRAVENOUS | Status: AC
  Filled 2020-02-05: qty 10

## 2020-02-05 MED ORDER — METHOCARBAMOL 1000 MG/10ML IJ SOLN
500.0000 mg | Freq: Four times a day (QID) | INTRAVENOUS | Status: DC | PRN
  Filled 2020-02-05 (×2): qty 5

## 2020-02-05 MED ORDER — METOCLOPRAMIDE HCL 5 MG/ML IJ SOLN
10.0000 mg | Freq: Four times a day (QID) | INTRAMUSCULAR | Status: DC
  Administered 2020-02-05 – 2020-02-07 (×7): 10 mg via INTRAVENOUS
  Filled 2020-02-05 (×7): qty 2

## 2020-02-05 MED ORDER — ENOXAPARIN (LOVENOX) PATIENT EDUCATION KIT
PACK | Freq: Once | Status: DC
  Filled 2020-02-05: qty 1

## 2020-02-05 MED ORDER — MIDAZOLAM HCL 2 MG/2ML IJ SOLN
INTRAMUSCULAR | Status: DC | PRN
  Administered 2020-02-05: 2 mg via INTRAVENOUS

## 2020-02-05 MED ORDER — KETOROLAC TROMETHAMINE 15 MG/ML IJ SOLN
15.0000 mg | Freq: Three times a day (TID) | INTRAMUSCULAR | Status: DC | PRN
  Administered 2020-02-06: 15 mg via INTRAVENOUS
  Filled 2020-02-05: qty 1

## 2020-02-05 MED ORDER — CHLORHEXIDINE GLUCONATE 4 % EX LIQD: 60.0000 mL | Freq: Once | CUTANEOUS | Status: DC

## 2020-02-05 MED ORDER — ONDANSETRON HCL 4 MG/2ML IJ SOLN
4.0000 mg | INTRAMUSCULAR | Status: DC | PRN
  Administered 2020-02-05 – 2020-02-06 (×3): 4 mg via INTRAVENOUS
  Filled 2020-02-05 (×4): qty 2

## 2020-02-05 MED ORDER — LACTATED RINGERS IR SOLN
Status: DC | PRN
  Administered 2020-02-05: 1000 mL

## 2020-02-05 MED ORDER — ORAL CARE MOUTH RINSE
15.0000 mL | Freq: Once | OROMUCOSAL | Status: AC
  Administered 2020-02-05: 15 mL via OROMUCOSAL

## 2020-02-05 MED ORDER — ENSURE MAX PROTEIN PO LIQD
2.0000 [oz_av] | ORAL | Status: DC
  Administered 2020-02-06 – 2020-02-07 (×10): 2 [oz_av] via ORAL

## 2020-02-05 MED ORDER — SIMETHICONE 80 MG PO CHEW: 80.0000 mg | CHEWABLE_TABLET | Freq: Four times a day (QID) | ORAL | Status: DC | PRN

## 2020-02-05 MED ORDER — BUSPIRONE HCL 5 MG PO TABS
7.5000 mg | ORAL_TABLET | Freq: Two times a day (BID) | ORAL | Status: DC
  Administered 2020-02-06: 7.5 mg via ORAL
  Filled 2020-02-05 (×3): qty 2

## 2020-02-05 MED ORDER — ACETAMINOPHEN 500 MG PO TABS: 1000.0000 mg | ORAL_TABLET | Freq: Three times a day (TID) | ORAL | Status: DC

## 2020-02-05 MED ORDER — PROPOFOL 10 MG/ML IV BOLUS
INTRAVENOUS | Status: AC
  Filled 2020-02-05: qty 40

## 2020-02-05 MED ORDER — ACETAMINOPHEN 160 MG/5ML PO SOLN
1000.0000 mg | Freq: Three times a day (TID) | ORAL | Status: DC
  Administered 2020-02-05 – 2020-02-07 (×3): 1000 mg via ORAL
  Filled 2020-02-05 (×4): qty 40.6

## 2020-02-05 MED ORDER — PROPOFOL 10 MG/ML IV BOLUS
INTRAVENOUS | Status: DC | PRN
  Administered 2020-02-05: 150 mg via INTRAVENOUS
  Administered 2020-02-05: 20 mg via INTRAVENOUS

## 2020-02-05 MED ORDER — TRAMADOL HCL 50 MG PO TABS: 50.0000 mg | ORAL_TABLET | Freq: Four times a day (QID) | ORAL | Status: DC | PRN

## 2020-02-05 MED ORDER — ACETAMINOPHEN 500 MG PO TABS
1000.0000 mg | ORAL_TABLET | ORAL | Status: AC
  Administered 2020-02-05: 1000 mg via ORAL
  Filled 2020-02-05: qty 2

## 2020-02-05 MED ORDER — BUPIVACAINE-EPINEPHRINE 0.25% -1:200000 IJ SOLN
INTRAMUSCULAR | Status: DC | PRN
  Administered 2020-02-05: 30 mL

## 2020-02-05 MED ORDER — PHENYLEPHRINE 40 MCG/ML (10ML) SYRINGE FOR IV PUSH (FOR BLOOD PRESSURE SUPPORT)
PREFILLED_SYRINGE | INTRAVENOUS | Status: DC | PRN
  Administered 2020-02-05: 40 ug via INTRAVENOUS

## 2020-02-05 MED ORDER — MIDAZOLAM HCL 2 MG/2ML IJ SOLN
INTRAMUSCULAR | Status: AC
  Filled 2020-02-05: qty 2

## 2020-02-05 MED ORDER — LIDOCAINE HCL 2 % IJ SOLN
INTRAMUSCULAR | Status: AC
  Filled 2020-02-05: qty 20

## 2020-02-05 MED ORDER — EPHEDRINE SULFATE-NACL 50-0.9 MG/10ML-% IV SOSY
PREFILLED_SYRINGE | INTRAVENOUS | Status: DC | PRN
  Administered 2020-02-05 (×2): 10 mg via INTRAVENOUS

## 2020-02-05 MED ORDER — LACTATED RINGERS IV SOLN: INTRAVENOUS | Status: DC

## 2020-02-05 MED ORDER — SCOPOLAMINE 1 MG/3DAYS TD PT72
MEDICATED_PATCH | TRANSDERMAL | Status: AC
  Filled 2020-02-05: qty 1

## 2020-02-05 MED ORDER — MEPERIDINE HCL 50 MG/ML IJ SOLN: 6.2500 mg | INTRAMUSCULAR | Status: DC | PRN

## 2020-02-05 MED ORDER — OXYCODONE HCL 5 MG/5ML PO SOLN
5.0000 mg | Freq: Four times a day (QID) | ORAL | Status: DC | PRN
  Administered 2020-02-05: 5 mg via ORAL
  Filled 2020-02-05 (×2): qty 5

## 2020-02-05 MED ORDER — ONDANSETRON HCL 4 MG/2ML IJ SOLN
INTRAMUSCULAR | Status: DC | PRN
  Administered 2020-02-05: 4 mg via INTRAVENOUS

## 2020-02-05 MED ORDER — METOPROLOL TARTRATE 5 MG/5ML IV SOLN: 5.0000 mg | Freq: Four times a day (QID) | INTRAVENOUS | Status: DC | PRN

## 2020-02-05 MED ORDER — PHENYLEPHRINE 40 MCG/ML (10ML) SYRINGE FOR IV PUSH (FOR BLOOD PRESSURE SUPPORT)
PREFILLED_SYRINGE | INTRAVENOUS | Status: AC
  Filled 2020-02-05: qty 10

## 2020-02-05 MED ORDER — DEXAMETHASONE SODIUM PHOSPHATE 10 MG/ML IJ SOLN
INTRAMUSCULAR | Status: AC
  Filled 2020-02-05: qty 1

## 2020-02-05 MED ORDER — LIDOCAINE 2% (20 MG/ML) 5 ML SYRINGE
INTRAMUSCULAR | Status: DC | PRN
  Administered 2020-02-05: 1.5 mg/kg/h via INTRAVENOUS

## 2020-02-05 MED ORDER — KETAMINE HCL 10 MG/ML IJ SOLN
INTRAMUSCULAR | Status: DC | PRN
  Administered 2020-02-05: 30 mg via INTRAVENOUS

## 2020-02-05 MED ORDER — GABAPENTIN 300 MG PO CAPS
300.0000 mg | ORAL_CAPSULE | ORAL | Status: AC
  Administered 2020-02-05: 300 mg via ORAL
  Filled 2020-02-05: qty 1

## 2020-02-05 MED ORDER — APREPITANT 40 MG PO CAPS
40.0000 mg | ORAL_CAPSULE | ORAL | Status: AC
  Administered 2020-02-05: 40 mg via ORAL
  Filled 2020-02-05: qty 1

## 2020-02-05 MED ORDER — SODIUM CHLORIDE 0.9 % IV SOLN: INTRAVENOUS | Status: DC

## 2020-02-05 MED ORDER — ONDANSETRON HCL 4 MG/2ML IJ SOLN
4.0000 mg | Freq: Once | INTRAMUSCULAR | Status: AC | PRN
  Administered 2020-02-05: 4 mg via INTRAVENOUS

## 2020-02-05 MED ORDER — ROCURONIUM BROMIDE 10 MG/ML (PF) SYRINGE
PREFILLED_SYRINGE | INTRAVENOUS | Status: DC | PRN
  Administered 2020-02-05: 100 mg via INTRAVENOUS

## 2020-02-05 MED ORDER — PROCHLORPERAZINE EDISYLATE 10 MG/2ML IJ SOLN
5.0000 mg | Freq: Four times a day (QID) | INTRAMUSCULAR | Status: DC | PRN
  Administered 2020-02-06: 5 mg via INTRAVENOUS
  Filled 2020-02-05: qty 2

## 2020-02-05 MED ORDER — BUPIVACAINE-EPINEPHRINE (PF) 0.25% -1:200000 IJ SOLN
INTRAMUSCULAR | Status: AC
  Filled 2020-02-05: qty 30

## 2020-02-05 MED ORDER — ALBUTEROL SULFATE HFA 108 (90 BASE) MCG/ACT IN AERS
2.0000 | INHALATION_SPRAY | Freq: Four times a day (QID) | RESPIRATORY_TRACT | Status: DC | PRN
  Filled 2020-02-05: qty 6.7

## 2020-02-05 MED ORDER — BUPIVACAINE LIPOSOME 1.3 % IJ SUSP
INTRAMUSCULAR | Status: DC | PRN
  Administered 2020-02-05: 20 mL

## 2020-02-05 MED ORDER — LIDOCAINE HCL (CARDIAC) PF 100 MG/5ML IV SOSY
PREFILLED_SYRINGE | INTRAVENOUS | Status: DC | PRN
  Administered 2020-02-05: 100 mg via INTRAVENOUS

## 2020-02-05 MED ORDER — FENTANYL CITRATE (PF) 250 MCG/5ML IJ SOLN
INTRAMUSCULAR | Status: DC | PRN
  Administered 2020-02-05 (×3): 50 ug via INTRAVENOUS
  Administered 2020-02-05: 100 ug via INTRAVENOUS

## 2020-02-05 MED ORDER — PANTOPRAZOLE SODIUM 40 MG IV SOLR
40.0000 mg | Freq: Every day | INTRAVENOUS | Status: DC
  Administered 2020-02-05 – 2020-02-06 (×2): 40 mg via INTRAVENOUS
  Filled 2020-02-05 (×2): qty 40

## 2020-02-05 MED ORDER — HYDROMORPHONE HCL 1 MG/ML IJ SOLN
0.2500 mg | INTRAMUSCULAR | Status: DC | PRN
  Administered 2020-02-05 (×3): 0.5 mg via INTRAVENOUS

## 2020-02-05 MED ORDER — ENOXAPARIN SODIUM 30 MG/0.3ML ~~LOC~~ SOLN
30.0000 mg | Freq: Two times a day (BID) | SUBCUTANEOUS | Status: DC
  Administered 2020-02-05 – 2020-02-07 (×4): 30 mg via SUBCUTANEOUS
  Filled 2020-02-05 (×4): qty 0.3

## 2020-02-05 MED ORDER — FENTANYL CITRATE (PF) 250 MCG/5ML IJ SOLN
INTRAMUSCULAR | Status: AC
  Filled 2020-02-05: qty 5

## 2020-02-05 MED ORDER — 0.9 % SODIUM CHLORIDE (POUR BTL) OPTIME
TOPICAL | Status: DC | PRN
  Administered 2020-02-05: 1000 mL

## 2020-02-05 MED ORDER — HYDRALAZINE HCL 20 MG/ML IJ SOLN: 10.0000 mg | INTRAMUSCULAR | Status: DC | PRN

## 2020-02-05 MED ORDER — SUGAMMADEX SODIUM 200 MG/2ML IV SOLN
INTRAVENOUS | Status: DC | PRN
  Administered 2020-02-05: 200 mg via INTRAVENOUS

## 2020-02-05 MED ORDER — ATENOLOL 25 MG PO TABS
12.5000 mg | ORAL_TABLET | Freq: Once | ORAL | Status: AC
  Administered 2020-02-05: 12.5 mg via ORAL
  Filled 2020-02-05: qty 0.5

## 2020-02-05 MED ORDER — STERILE WATER FOR IRRIGATION IR SOLN
Status: DC | PRN
  Administered 2020-02-05: 2000 mL

## 2020-02-05 SURGICAL SUPPLY — 65 items
APPLICATOR COTTON TIP 6 STRL (MISCELLANEOUS) IMPLANT
APPLICATOR COTTON TIP 6IN STRL (MISCELLANEOUS)
APPLIER CLIP ROT 10 11.4 M/L (STAPLE)
APPLIER CLIP ROT 13.4 12 LRG (CLIP)
BAG LAPAROSCOPIC 12 15 PORT 16 (BASKET) IMPLANT
BAG RETRIEVAL 12/15 (BASKET)
BENZOIN TINCTURE PRP APPL 2/3 (GAUZE/BANDAGES/DRESSINGS) ×3 IMPLANT
BLADE SURG SZ11 CARB STEEL (BLADE) ×3 IMPLANT
BNDG ADH 1X3 SHEER STRL LF (GAUZE/BANDAGES/DRESSINGS) ×3 IMPLANT
CABLE HIGH FREQUENCY MONO STRZ (ELECTRODE) ×3 IMPLANT
CHLORAPREP W/TINT 26 (MISCELLANEOUS) ×6 IMPLANT
CLIP APPLIE ROT 10 11.4 M/L (STAPLE) IMPLANT
CLIP APPLIE ROT 13.4 12 LRG (CLIP) IMPLANT
COVER SURGICAL LIGHT HANDLE (MISCELLANEOUS) ×3 IMPLANT
COVER WAND RF STERILE (DRAPES) IMPLANT
DECANTER SPIKE VIAL GLASS SM (MISCELLANEOUS) ×3 IMPLANT
DEVICE SUT QUICK LOAD TK 5 (STAPLE) IMPLANT
DEVICE SUT TI-KNOT TK 5X26 (MISCELLANEOUS) IMPLANT
DRAPE UTILITY XL STRL (DRAPES) ×6 IMPLANT
ELECT REM PT RETURN 15FT ADLT (MISCELLANEOUS) ×3 IMPLANT
GAUZE SPONGE 4X4 12PLY STRL (GAUZE/BANDAGES/DRESSINGS) IMPLANT
GLOVE BIO SURGEON STRL SZ 6 (GLOVE) ×3 IMPLANT
GLOVE INDICATOR 6.5 STRL GRN (GLOVE) ×3 IMPLANT
GOWN STRL REUS W/TWL LRG LVL3 (GOWN DISPOSABLE) ×3 IMPLANT
GOWN STRL REUS W/TWL XL LVL3 (GOWN DISPOSABLE) ×6 IMPLANT
GRASPER SUT TROCAR 14GX15 (MISCELLANEOUS) ×3 IMPLANT
HOVERMATT SINGLE USE (MISCELLANEOUS) ×3 IMPLANT
KIT BASIN OR (CUSTOM PROCEDURE TRAY) ×3 IMPLANT
KIT TURNOVER KIT A (KITS) IMPLANT
MARKER SKIN DUAL TIP RULER LAB (MISCELLANEOUS) ×3 IMPLANT
NEEDLE SPNL 22GX3.5 QUINCKE BK (NEEDLE) ×3 IMPLANT
PACK UNIVERSAL I (CUSTOM PROCEDURE TRAY) ×3 IMPLANT
PENCIL SMOKE EVACUATOR (MISCELLANEOUS) IMPLANT
RELOAD ENDO STITCH (ENDOMECHANICALS) IMPLANT
RELOAD STAPLER BLUE 60MM (STAPLE) ×8 IMPLANT
RELOAD STAPLER GOLD 60MM (STAPLE) ×2 IMPLANT
RELOAD STAPLER GREEN 60MM (STAPLE) ×2 IMPLANT
SCISSORS LAP 5X45 EPIX DISP (ENDOMECHANICALS) ×3 IMPLANT
SET IRRIG TUBING LAPAROSCOPIC (IRRIGATION / IRRIGATOR) ×3 IMPLANT
SET TUBE SMOKE EVAC HIGH FLOW (TUBING) ×3 IMPLANT
SHEARS HARMONIC ACE PLUS 45CM (MISCELLANEOUS) ×3 IMPLANT
SLEEVE ADV FIXATION 5X100MM (TROCAR) ×6 IMPLANT
SLEEVE GASTRECTOMY 40FR VISIGI (MISCELLANEOUS) ×3 IMPLANT
SOL ANTI FOG 6CC (MISCELLANEOUS) ×2 IMPLANT
SOLUTION ANTI FOG 6CC (MISCELLANEOUS) ×1
SPONGE LAP 18X18 RF (DISPOSABLE) ×3 IMPLANT
STAPLER ECHELON BIOABSB 60 FLE (MISCELLANEOUS) ×15 IMPLANT
STAPLER ECHELON LONG 60 440 (INSTRUMENTS) ×3 IMPLANT
STAPLER RELOAD BLUE 60MM (STAPLE) ×12
STAPLER RELOAD GOLD 60MM (STAPLE) ×3
STAPLER RELOAD GREEN 60MM (STAPLE) ×3
STRIP CLOSURE SKIN 1/2X4 (GAUZE/BANDAGES/DRESSINGS) ×3 IMPLANT
SUT MNCRL AB 4-0 PS2 18 (SUTURE) ×3 IMPLANT
SUT SURGIDAC NAB ES-9 0 48 120 (SUTURE) IMPLANT
SUT VICRYL 0 TIES 12 18 (SUTURE) ×3 IMPLANT
SYR 10ML ECCENTRIC (SYRINGE) ×3 IMPLANT
SYR 20ML LL LF (SYRINGE) ×3 IMPLANT
SYR 50ML LL SCALE MARK (SYRINGE) ×3 IMPLANT
TOWEL OR 17X26 10 PK STRL BLUE (TOWEL DISPOSABLE) ×3 IMPLANT
TOWEL OR NON WOVEN STRL DISP B (DISPOSABLE) ×3 IMPLANT
TROCAR ADV FIXATION 5X100MM (TROCAR) ×3 IMPLANT
TROCAR BLADELESS 15MM (ENDOMECHANICALS) ×3 IMPLANT
TROCAR BLADELESS OPT 5 100 (ENDOMECHANICALS) ×3 IMPLANT
TUBING CONNECTING 10 (TUBING) ×3 IMPLANT
TUBING ENDO SMARTCAP (MISCELLANEOUS) ×3 IMPLANT

## 2020-02-05 NOTE — Anesthesia Procedure Notes (Signed)
Procedure Name: Intubation Date/Time: 02/05/2020 7:42 AM Performed by: Raenette Rover, CRNA Pre-anesthesia Checklist: Patient identified, Emergency Drugs available, Suction available and Patient being monitored Patient Re-evaluated:Patient Re-evaluated prior to induction Oxygen Delivery Method: Circle system utilized Preoxygenation: Pre-oxygenation with 100% oxygen Induction Type: IV induction Ventilation: Mask ventilation without difficulty Laryngoscope Size: Mac and 3 Grade View: Grade I Tube type: Oral Tube size: 7.0 mm Number of attempts: 1 Airway Equipment and Method: Stylet Placement Confirmation: ETT inserted through vocal cords under direct vision,  positive ETCO2 and breath sounds checked- equal and bilateral Secured at: 22 cm Tube secured with: Tape Dental Injury: Teeth and Oropharynx as per pre-operative assessment

## 2020-02-05 NOTE — Interval H&P Note (Signed)
History and Physical Interval Note:  02/05/2020 7:05 AM  Paula King  has presented today for surgery, with the diagnosis of morbid obesity.  The various methods of treatment have been discussed with the patient and family. After consideration of risks, benefits and other options for treatment, the patient has consented to  Procedure(s): LAPAROSCOPIC SLEEVE GASTRECTOMY (N/A) UPPER GI ENDOSCOPY (N/A) as a surgical intervention.  The patient's history has been reviewed, patient examined, no change in status, stable for surgery.  I have reviewed the patient's chart and labs.  Questions were answered to the patient's satisfaction.     Willis Holquin Rich Brave

## 2020-02-05 NOTE — Progress Notes (Signed)
Discussed post op day goals with patient including ambulation, IS, diet progression, pain, and nausea control.  BSTOP education provided including BSTOP information guide, "Guide for Pain Management after your Bariatric Procedure".  Questions answered. 

## 2020-02-05 NOTE — Anesthesia Postprocedure Evaluation (Signed)
Anesthesia Post Note  Patient: Paula King  Procedure(s) Performed: LAPAROSCOPIC SLEEVE GASTRECTOMY (N/A Abdomen) UPPER GI ENDOSCOPY (N/A Esophagus)     Patient location during evaluation: PACU Anesthesia Type: General Level of consciousness: awake and alert Pain management: pain level controlled Vital Signs Assessment: post-procedure vital signs reviewed and stable Respiratory status: spontaneous breathing, nonlabored ventilation, respiratory function stable and patient connected to nasal cannula oxygen Cardiovascular status: blood pressure returned to baseline and stable Postop Assessment: no apparent nausea or vomiting Anesthetic complications: no   No complications documented.  Last Vitals:  Vitals:   02/05/20 1321 02/05/20 1421  BP: 133/86 (!) 146/88  Pulse: (!) 48 (!) 51  Resp: (!) 22 (!) 22  Temp: (!) 36.4 C   SpO2: 99% 98%    Last Pain:  Vitals:   02/05/20 1321  TempSrc: Oral  PainSc:                  Bhavin Monjaraz DAVID

## 2020-02-05 NOTE — Anesthesia Preprocedure Evaluation (Signed)
Anesthesia Evaluation  Patient identified by MRN, date of birth, ID band Patient awake    Reviewed: Allergy & Precautions, NPO status , Patient's Chart, lab work & pertinent test results  Airway Mallampati: II  TM Distance: >3 FB Neck ROM: Full    Dental   Pulmonary    Pulmonary exam normal        Cardiovascular hypertension, Pt. on medications Normal cardiovascular exam     Neuro/Psych Anxiety    GI/Hepatic GERD  Medicated and Controlled,  Endo/Other    Renal/GU      Musculoskeletal   Abdominal   Peds  Hematology   Anesthesia Other Findings   Reproductive/Obstetrics                             Anesthesia Physical Anesthesia Plan  ASA: III  Anesthesia Plan: General   Post-op Pain Management:    Induction: Intravenous  PONV Risk Score and Plan: 3 and Midazolam, Ondansetron and Treatment may vary due to age or medical condition  Airway Management Planned: Oral ETT  Additional Equipment:   Intra-op Plan:   Post-operative Plan: Extubation in OR  Informed Consent: I have reviewed the patients History and Physical, chart, labs and discussed the procedure including the risks, benefits and alternatives for the proposed anesthesia with the patient or authorized representative who has indicated his/her understanding and acceptance.       Plan Discussed with: CRNA and Surgeon  Anesthesia Plan Comments:         Anesthesia Quick Evaluation

## 2020-02-05 NOTE — Progress Notes (Signed)
Unable to start H2O as ordered, patient states that she is too nausea to drink anything,Scheduled and PRN meds given

## 2020-02-05 NOTE — Op Note (Signed)
Operative Note  YUKARI FLAX  [id number]  [id number]  02/05/2020   Surgeon: Romana Juniper MD   Assistant: Greer Pickerel MD   Procedure performed: laparoscopic sleeve gastrectomy, upper endoscopy   Preop diagnosis: Morbid obesity Body mass index is 44.66 kg/m. Post-op diagnosis/intraop findings: same   Specimens: fundus Retained items: none  EBL: minimal cc Complications: none   Description of procedure: After obtaining informed consent and administration of chemical DVT prophylaxis in holding, the patient was taken to the operating room and placed supine on operating room table where general endotracheal anesthesia was initiated, preoperative antibiotics were administered, SCDs applied, and a formal timeout was performed. The abdomen was prepped and draped in usual sterile fashion. Peritoneal access was gained using a Visiport technique in the left upper quadrant and insufflation to 15 mmHg ensued without issue. Gross inspection revealed no evidence of injury. Under direct visualization three more 5 mm trochars were placed in the right and left hemiabdomen and a 72mm trocar in the right paramedian upper abdomen. Bilateral laparoscopic assisted TAPS blocks were performed with Exparel diluted with 0.25 percent Marcaine with epinephrine. The patient was placed in steep Trendelenburg and the liver retractor was introduced through an incision in the upper midline and secured to the post externally to maintain the left lobe retracted anteriorly.  There was no hiatal hernia on direct inspection. Using the Harmonic scalpel, the greater curvature of the stomach was dissected away from the greater omentum and short gastric vessels were divided. This began 6 cm from the pylorus, and dissection proceeded until the left crus was clearly exposed. The 34 [place] VisiGi was then introduced and directed down towards the pylorus. This was placed to suction against the lesser curve. Serial fires of the linear  cutting stapler with seamguards were then employed to create our sleeve. The first fire used a green load and ensured adequate room at the angularis incisura. One gold load and then four blue loads were then employed to create a narrow tubular stomach up to the angle of His. The excised stomach was then removed through our 15 mm trocar site within an Endo Catch bag.  The visigi was taken off of suction and a few puffs of air were introduced, inflating the sleeve. No bubbles were observed in the irrigation fluid around the stomach and the shape was noted to be evenly tubular without any narrowing at the angularis. The visigi was then removed. Upper endoscopy was performed by the assistant surgeon and the sleeve was noted to be airtight, the staple line was hemostatic, with no undue angulation or narrowing at the incisura. The endoscope was removed. Small oozing points on the distal staple line were addressed with clips. The 15 mm trocar site fascia in the right upper abdomen was closed with 2 interrupted sutures of 0 Vicryl using the laparoscopic suture passer under direct visualization. The liver retractor was removed under direct visualization. The abdomen was then desufflated and all remaining trochars removed. The skin incisions were closed with subcuticular Monocryl; benzoin, Steri-Strips and Band-Aids were applied The patient was then awakened, extubated and taken to PACU in stable condition.     All counts were correct at the completion of the case.

## 2020-02-05 NOTE — Transfer of Care (Signed)
Immediate Anesthesia Transfer of Care Note  Patient: Paula King  Procedure(s) Performed: LAPAROSCOPIC SLEEVE GASTRECTOMY (N/A Abdomen) UPPER GI ENDOSCOPY (N/A Esophagus)  Patient Location: PACU  Anesthesia Type:General  Level of Consciousness: awake, alert , oriented, drowsy and patient cooperative  Airway & Oxygen Therapy: Patient Spontanous Breathing and Patient connected to face mask oxygen  Post-op Assessment: Report given to RN and Post -op Vital signs reviewed and stable  Post vital signs: Reviewed and stable  Last Vitals:  Vitals Value Taken Time  BP 143/98 02/05/20 0915  Temp    Pulse 75 02/05/20 0916  Resp 22 02/05/20 0916  SpO2 94 % 02/05/20 0916  Vitals shown include unvalidated device data.  Last Pain:  Vitals:   02/05/20 0624  TempSrc: Oral         Complications: No complications documented.

## 2020-02-05 NOTE — Op Note (Signed)
Paula King [id number] 1980-09-26 02/05/2020  Preoperative diagnosis: severe obesity  Postoperative diagnosis: Same   Procedure: upper endoscopy   Surgeon: Leighton Ruff. Jacy Howat M.D., FACS   Anesthesia: Gen.   Indications for procedure: 39 y.o. year old female undergoing Laparoscopic Gastric Sleeve Resection and an EGD was requested to evaluate the new gastric sleeve.   Description of procedure: After we have completed the sleeve resection, I scrubbed out and obtained the Olympus endoscope. I gently placed endoscope in the patient's oropharynx and gently glided it down the esophagus without any difficulty under direct visualization. Once I was in the gastric sleeve, I insufflated the stomach with air.patient had some residual food contents in her sleeve. I was able to cannulate and advanced the scope through the gastric sleeve. I was able to cannulate the duodenum with ease. Dr. Kae Heller had placed saline in the upper abdomen. Upon further insufflation of the gastric sleeve there was no evidence of bubbles. GE junction located at 36 cm.  Upon further inspection of the gastric sleeve, the mucosa appeared normal. There is no evidence of any mucosal abnormality. The sleeve was widely patent at the angularis. There was no evidence of bleeding. The gastric sleeve was decompressed. The scope was withdrawn. The patient tolerated this portion of the procedure well. Please see Dr Ron Parker operative note for details regarding the laparoscopic gastric sleeve resection.   Leighton Ruff. Redmond Pulling, MD, FACS  General, Bariatric, & Minimally Invasive Surgery  [facility], [place]

## 2020-02-05 NOTE — Progress Notes (Signed)
PHARMACY CONSULT FOR:  Risk Assessment for Post-Discharge VTE Following Bariatric Surgery  Post-Discharge VTE Risk Assessment: This patient's probability of 30-day post-discharge VTE is increased due to the factors marked:   Female    Age >/=60 years    BMI >/=50 kg/m2    CHF    Dyspnea at Rest    Paraplegia   X Non-gastric-band surgery    Operation Time >/=3 hr    Return to OR     Length of Stay >/= 3 d  X Hx of VTE   Hypercoagulable condition   Significant venous stasis   Predicted probability of 30-day post-discharge VTE: 0.16%  Other patient-specific factors to consider:  Pulmonary embolism 03/07/2019  Recommendation for Discharge: Enoxaparin 40 mg [place] q12h x 4 weeks post-discharge     Paula King is a 39 y.o. female who underwent LAPAROSCOPIC SLEEVE GASTRECTOMY on 02/05/2020.   CASE START Tue Feb 05, 2020 2035  CASE END Tue Feb 05, 2020 0908      Allergies  Allergen Reactions  . Ciprofloxacin     IV Only-Patient started sweating and becoming clammy when was infused with IV cipro on 08/09/14. Can tolerate PO Ciprofloxacin     Patient Measurements: Weight: 118 kg (260 lb 3.2 oz) Body mass index is 44.66 kg/m.  No results for input(s): WBC, HGB, HCT, PLT, APTT, CREATININE, LABCREA, CREATININE, CREAT24HRUR, MG, PHOS, ALBUMIN, PROT, ALBUMIN, AST, ALT, ALKPHOS, BILITOT, BILIDIR, IBILI in the last 72 hours. Estimated Creatinine Clearance: 79.5 mL/min (A) (by C-G formula based on SCr of 1.2 mg/dL (H)).    Past Medical History:  Diagnosis Date  . COVID-19 virus IgG antibody detected   . 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 Diverticulitis   . GERD (gastroesophageal reflux disease)   . Hypertension   . Obese   . Pneumonia   . Preeclampsia   . Pulmonary embolism (HCC)      Medications Prior to Admission  Medication Sig Dispense Refill Last Dose  . aspirin EC 81 MG tablet Take 81 mg by mouth daily.   Past Week at Unknown time  . atenolol (TENORMIN) 25 MG tablet Take  0.5 tablets (12.5 mg total) by mouth daily. (Patient taking differently: Take 12.5 mg by mouth daily as needed (hypertension.). ) 30 tablet 3 02/04/2020 at Unknown time  . busPIRone (BUSPAR) 7.5 MG tablet Take 1 tablet (7.5 mg total) by mouth 2 (two) times daily. 60 tablet 3 02/04/2020 at Unknown time  . cetirizine (ZYRTEC) 10 MG tablet Take 10 mg by mouth at bedtime.   02/04/2020 at Unknown time  . cholecalciferol (VITAMIN D) 1000 units tablet Take 1,000 Units by mouth.    02/04/2020 at Unknown time  . Cyanocobalamin (B-12) 1000 MCG TABS Take 1,000 mcg by mouth daily.    02/04/2020 at Unknown time  . albuterol (VENTOLIN HFA) 108 (90 Base) MCG/ACT inhaler Inhale 2 puffs into the lungs every 6 (six) hours as needed for wheezing or shortness of breath. 18 g 1 More than a month at Unknown time     Gretta Arab PharmD, BCPS Clinical Pharmacist [facility] main pharmacy [phone] 02/05/2020 11:48 AM

## 2020-02-06 ENCOUNTER — Encounter (HOSPITAL_COMMUNITY): Payer: Self-pay | Admitting: Surgery

## 2020-02-06 LAB — COMPREHENSIVE METABOLIC PANEL
ALT: 44 U/L (ref 0–44)
AST: 31 U/L (ref 15–41)
Albumin: 3.9 g/dL (ref 3.5–5.0)
Alkaline Phosphatase: 55 U/L (ref 38–126)
Anion gap: 10 (ref 5–15)
BUN: 9 mg/dL (ref 6–20)
CO2: 24 mmol/L (ref 22–32)
Calcium: 8.5 mg/dL — ABNORMAL LOW (ref 8.9–10.3)
Chloride: 104 mmol/L (ref 98–111)
Creatinine, Ser: 1.06 mg/dL — ABNORMAL HIGH (ref 0.44–1.00)
GFR calc Af Amer: >60 mL/min (ref >60)
GFR calc non Af Amer: >60 mL/min (ref >60)
Glucose, Bld: 119 mg/dL — ABNORMAL HIGH (ref 70–99)
Potassium: 3.9 mmol/L (ref 3.5–5.1)
Sodium: 138 mmol/L (ref 135–145)
Total Bilirubin: 0.8 mg/dL (ref 0.3–1.2)
Total Protein: 7.1 g/dL (ref 6.5–8.1)

## 2020-02-06 LAB — CBC WITH DIFFERENTIAL/PLATELET
Abs Immature Granulocytes: 0.07 10*3/uL (ref 0.00–0.07)
Basophils Absolute: 0 10*3/uL (ref 0.0–0.1)
Basophils Relative: 0 %
Eosinophils Absolute: 0 10*3/uL (ref 0.0–0.5)
Eosinophils Relative: 0 %
HCT: 42.5 % (ref 36.0–46.0)
Hemoglobin: 14.1 g/dL (ref 12.0–15.0)
Immature Granulocytes: 1 %
Lymphocytes Relative: 19 %
Lymphs Abs: 2.4 10*3/uL (ref 0.7–4.0)
MCH: 31.5 pg (ref 26.0–34.0)
MCHC: 33.2 g/dL (ref 30.0–36.0)
MCV: 94.9 fL (ref 80.0–100.0)
Monocytes Absolute: 1.1 10*3/uL — ABNORMAL HIGH (ref 0.1–1.0)
Monocytes Relative: 9 %
Neutro Abs: 8.9 10*3/uL — ABNORMAL HIGH (ref 1.7–7.7)
Neutrophils Relative %: 71 %
Platelets: 342 10*3/uL (ref 150–400)
RBC: 4.48 MIL/uL (ref 3.87–5.11)
RDW: 13.5 % (ref 11.5–15.5)
WBC: 12.5 10*3/uL — ABNORMAL HIGH (ref 4.0–10.5)
nRBC: 0 % (ref 0.0–0.2)

## 2020-02-06 LAB — MAGNESIUM: Magnesium: 2 mg/dL (ref 1.7–2.4)

## 2020-02-06 LAB — GLUCOSE, CAPILLARY: Glucose-Capillary: 90 mg/dL (ref 70–99)

## 2020-02-06 LAB — SURGICAL PATHOLOGY

## 2020-02-06 NOTE — Progress Notes (Signed)
Patient was able to start waters, but has had c/o nausea and has not been able to tolerate much by mouth, 3 waters completed and 4th poured, taking small sips. Administered PRN meds which provides relief for short about of time. Dawson Bills

## 2020-02-06 NOTE — Progress Notes (Signed)
Patient alert and oriented, Post op day 1.  Provided support and encouragement.  Encouraged pulmonary toilet, ambulation and small sips of liquids. Nausea overnight, more controlled at this time, working on bari clear fluid intake.  Completed 8 ounces of bari clear fluids.   All questions answered.  Will continue to monitor.

## 2020-02-06 NOTE — Progress Notes (Signed)
Patient alert and oriented, pain is controlled. Patient is tolerating fluids, advanced to protein shake today, patient is tolerating well.  Reviewed Gastric sleeve discharge instructions with patient and patient is able to articulate understanding.  Provided information on BELT program, Support Group, Lovenox and [facility] outpatient pharmacy. All questions answered, will continue to monitor.

## 2020-02-06 NOTE — Progress Notes (Signed)
S: Significant nausea yesterday and overnight, not much relief with zofran or reglan but compazine did help and she is feeling a bit better this AM. Some spasm pain in bilateral upper abdomen/costal margin region.  O: Vitals, labs, intake/output, and orders reviewed at this time. Tmax 98.4. HR 44-75, normo to hypertensive, sats 97% on nasal cannula- has not used IS PO 270, UOP 1950+ 3 occurrences CMP/Mg unremarkable; WBC 12.5 (10.3 preop), Hgb 14.1 (14.7 preop) Meds- sch gaba, apap, reglan; has rec'd zofran x 3, compazine x 1 this AM,  toradol x 1 this AM, oxycodone x 1 last night  Gen: A&Ox3, no distress, face is flush  H&N: EOMI, atraumatic, neck supple Chest: unlabored respirations, RRR Abd: soft, nontender, nondistended, incision(s) c/d/i with steris/bandaids, no cellulitis or hematoma Ext: warm, no edema Neuro: grossly normal  Lines/tubes/drains: PIV  A/P: POD 1 s/p sleeve gastrectomy, progressing slowly due to nausea -Work on IS/ wean off O2 -Ambulate in halls, SCDs while in bed, continue lovenox prophylaxis -Continue clears as tolerated and begin protein shakes when able -Minimize narcotics given nausea issues  Continue to monitor today for nausea control, PO tolerance.   Romana Juniper, MD [facility], [place]

## 2020-02-06 NOTE — Progress Notes (Signed)
Protein started by bedside RN.

## 2020-02-06 NOTE — Discharge Instructions (Signed)
Enoxaparin injection What is this medicine? ENOXAPARIN (ee nox a PA rin) is used after knee, hip, or abdominal surgeries to prevent blood clotting. It is also used to treat existing blood clots in the lungs or in the veins. This medicine may be used for other purposes; ask your health care provider or pharmacist if you have questions. COMMON BRAND NAME(S): Lovenox What should I tell my health care provider before I take this medicine? They need to know if you have any of these conditions:  bleeding disorders, hemorrhage, or hemophilia  infection of the heart or heart valves  kidney or liver disease  previous stroke  prosthetic heart valve  recent surgery or delivery of a baby  ulcer in the stomach or intestine, diverticulitis, or other bowel disease  an unusual or allergic reaction to enoxaparin, heparin, pork or pork products, other medicines, foods, dyes, or preservatives  pregnant or trying to get pregnant  breast-feeding How should I use this medicine? This medicine is for injection under the skin. It is usually given by a health-care professional. You or a family member may be trained on how to give the injections. If you are to give yourself injections, make sure you understand how to use the syringe, measure the dose if necessary, and give the injection. To avoid bruising, do not rub the site where this medicine has been injected. Do not take your medicine more often than directed. Do not stop taking except on the advice of your doctor or health care professional. Make sure you receive a puncture-resistant container to dispose of the needles and syringes once you have finished with them. Do not reuse these items. Return the container to your doctor or health care professional for proper disposal. Talk to your pediatrician regarding the use of this medicine in children. Special care may be needed. Overdosage: If you think you have taken too much of this medicine contact a poison  control center or emergency room at once. NOTE: This medicine is only for you. Do not share this medicine with others. What if I miss a dose? If you miss a dose, take it as soon as you can. If it is almost time for your next dose, take only that dose. Do not take double or extra doses. What may interact with this medicine?  aspirin and aspirin-like medicines  certain medicines that treat or prevent blood clots  dipyridamole  NSAIDs, medicines for pain and inflammation, like ibuprofen or naproxen This list may not describe all possible interactions. Give your health care provider a list of all the medicines, herbs, non-prescription drugs, or dietary supplements you use. Also tell them if you smoke, drink alcohol, or use illegal drugs. Some items may interact with your medicine. What should I watch for while using this medicine? Visit your healthcare professional for regular checks on your progress. You may need blood work done while you are taking this medicine. Your condition will be monitored carefully while you are receiving this medicine. It is important not to miss any appointments. If you are going to need surgery or other procedure, tell your healthcare professional that you are using this medicine. Using this medicine for a long time may weaken your bones and increase the risk of bone fractures. Avoid sports and activities that might cause injury while you are using this medicine. Severe falls or injuries can cause unseen bleeding. Be careful when using sharp tools or knives. Consider using an Copy. Take special care brushing or flossing your  teeth. Report any injuries, bruising, or red spots on the skin to your healthcare professional. Wear a medical ID bracelet or chain. Carry a card that describes your disease and details of your medicine and dosage times. What side effects may I notice from receiving this medicine? Side effects that you should report to your doctor or health  care professional as soon as possible:  allergic reactions like skin rash, itching or hives, swelling of the face, lips, or tongue  bone pain  signs and symptoms of bleeding such as bloody or black, tarry stools; red or dark-brown urine; spitting up blood or brown material that looks like coffee grounds; red spots on the skin; unusual bruising or bleeding from the eye, gums, or nose  signs and symptoms of a blood clot such as chest pain; shortness of breath; pain, swelling, or warmth in the leg  signs and symptoms of a stroke such as changes in vision; confusion; trouble speaking or understanding; severe headaches; sudden numbness or weakness of the face, arm or leg; trouble walking; dizziness; loss of coordination Side effects that usually do not require medical attention (report to your doctor or health care professional if they continue or are bothersome):  hair loss  pain, redness, or irritation at site where injected This list may not describe all possible side effects. Call your doctor for medical advice about side effects. You may report side effects to FDA at 1-800-FDA-1088. Where should I keep my medicine? Keep out of the reach of children. Store at room temperature between 15 and 30 degrees C (59 and 86 degrees F). Do not freeze. If your injections have been specially prepared, you may need to store them in the refrigerator. Ask your pharmacist. Throw away any unused medicine after the expiration date. NOTE: This sheet is a summary. It may not cover all possible information. If you have questions about this medicine, talk to your doctor, pharmacist, or health care provider.  2020 Elsevier/Gold Standard (2017-06-09 11:25:34)     GASTRIC BYPASS/SLEEVE  Home Care Instructions   These instructions are to help you care for yourself when you go home.  Call: If you have any problems. . Call [phone] and ask for the surgeon on call . If you need immediate help, come to the ER  at [facility].  . Tell the ER staff that you are a new post-op gastric bypass or gastric sleeve patient   Signs and symptoms to report: . Severe vomiting or nausea o If you cannot keep down clear liquids for longer than 1 day, call your surgeon  . Abdominal pain that does not get better after taking your pain medication . Fever over 100.4 F with chills . Heart beating over 100 beats a minute . Shortness of breath at rest . Chest pain .  Redness, swelling, drainage, or foul odor at incision (surgical) sites .  If your incisions open or pull apart . Swelling or pain in calf (lower leg) . Diarrhea (Loose bowel movements that happen often), frequent watery, uncontrolled bowel movements . Constipation, (no bowel movements for 3 days) if this happens: Pick one o Milk of Magnesia, 2 tablespoons by mouth, 3 times a day for 2 days if needed o Stop taking Milk of Magnesia once you have a bowel movement o Call your doctor if constipation continues Or o Miralax  (instead of Milk of Magnesia) following the label instructions o Stop taking Miralax once you have a bowel movement o Call your doctor if constipation  continues . Anything you think is not normal   Normal side effects after surgery: . Unable to sleep at night or unable to focus . Irritability or moody . Being tearful (crying) or depressed These are common complaints, possibly related to your anesthesia medications that put you to sleep, stress of surgery, and change in lifestyle.  This usually goes away a few weeks after surgery.  If these feelings continue, call your primary care doctor.   Wound Care: You may have surgical glue, steri-strips, or staples over your incisions after surgery . Surgical glue:  Looks like a clear film over your incisions and will wear off a little at a time . Steri-strips: Strips of tape over your incisions. You may notice a yellowish color on the skin under the steri-strips. This is used to make the    steri-strips stick better. Do not pull the steri-strips off - let them fall off . Staples: Jodell Cipro may be removed before you leave the hospital o If you go home with staples, call [facility], [phone]) [phone] at for an appointment with your surgeon's nurse to have staples removed 10 days after surgery. . Showering: You may shower two (2) days after your surgery unless your surgeon tells you differently o Wash gently around incisions with warm soapy water, rinse well, and gently pat dry  o No tub baths until staples are removed, steri-strips fall off or glue is gone.    Medications: Marland Kitchen Medications should be liquid or crushed if larger than the size of a dime . Extended release pills (medication that release a little bit at a time through the day) should NOT be crushed or cut. (examples include XL, ER, DR, SR) . Depending on the size and number of medications you take, you may need to space (take a few throughout the day)/change the time you take your medications so that you do not over-fill your pouch (smaller stomach) . Make sure you follow-up with your primary care doctor to make medication changes needed during rapid weight loss and life-style changes . If you have diabetes, follow up with the doctor that orders your diabetes medication(s) within one week after surgery and check your blood sugar regularly. . Do not drive while taking prescription pain medication  . It is ok to take Tylenol by the bottle instructions with your pain medicine or instead of your pain medicine as needed.  DO NOT TAKE NSAIDS (EXAMPLES OF NSAIDS:  IBUPROFREN/ NAPROXEN)  Diet:                    First 2 Weeks  You will see the dietician t about two (2) weeks after your surgery. The dietician will increase the types of foods you can eat if you are handling liquids well: Marland Kitchen If you have severe vomiting or nausea and cannot keep down clear liquids lasting longer than 1 day, call your surgeon @ [phone]) Protein  Shake . Drink at least 2 ounces of shake 5-6 times per day . Each serving of protein shakes (usually 8 - 12 ounces) should have: o 15 grams of protein  o And no more than 5 grams of carbohydrate  . Goal for protein each day: o Men = 80 grams per day o Women = 60 grams per day . Protein powder may be added to fluids such as non-fat milk or Lactaid milk or unsweetened Soy/Almond milk (limit to 35 grams added protein powder per serving)  Hydration . Slowly increase the  amount of water and other clear liquids as tolerated (See Acceptable Fluids) . Slowly increase the amount of protein shake as tolerated  .  Sip fluids slowly and throughout the day.  Do not use straws. . May use sugar substitutes in small amounts (no more than 6 - 8 packets per day; i.e. Splenda)  Fluid Goal . The first goal is to drink at least 8 ounces of protein shake/drink per day (or as directed by the nutritionist); some examples of protein shakes are Johnson [employer] Johnson, AMR Corporation, EAS Edge HP, and Unjury. See handout from pre-op Bariatric Education Class: o Slowly increase the amount of protein shake you drink as tolerated o You may find it easier to slowly sip shakes throughout the day o It is important to get your proteins in first . Your fluid goal is to drink 64 - 100 ounces of fluid daily o It may take a few weeks to build up to this . 32 oz (or more) should be clear liquids  And  . 32 oz (or more) should be full liquids (see below for examples) . Liquids should not contain sugar, caffeine, or carbonation  Clear Liquids: . Water or Sugar-free flavored water (i.e. Fruit H2O, Propel) . Decaffeinated coffee or tea (sugar-free) . [employer], [employer], Minute [facility] . Sugar-free Jell-O . Bouillon or broth . Sugar-free Popsicle:   *Less than 20 calories each; Limit 1 per day  Full Liquids: Protein Shakes/Drinks + 2 choices per day of other full liquids . Full liquids must be: o No More Than 15  grams of Carbs per serving  o No More Than 3 grams of Fat per serving . Strained low-fat cream soup (except Cream of Potato or Tomato) . Non-Fat milk . Fat-free Lactaid Milk . Unsweetened Soy Or Unsweetened Almond Milk . Low Sugar yogurt (Dannon Lite & Fit, [place] yogurt; Oikos Triple Zero; Chobani Simply 100; Yoplait 100 calorie [place] - No Fruit on the Bottom)    Vitamins and Minerals . Start 1 day after surgery unless otherwise directed by your surgeon . Chewable Bariatric Specific Multivitamin / Multimineral Supplement with iron (Example: Bariatric Advantage Multi EA) . Chewable Calcium with Vitamin D-3 (Example: 3 Chewable Calcium Plus 600 with Vitamin D-3) o Take 500 mg three (3) times a day for a total of 1500 mg each day o Do not take all 3 doses of calcium at one time as it may cause constipation, and you can only absorb 500 mg  at a time  o Do not mix multivitamins containing iron with calcium supplements; take 2 hours apart . Menstruating women and those with a history of anemia (a blood disease that causes weakness) may need extra iron o Talk with your doctor to see if you need more iron . Do not stop taking or change any vitamins or minerals until you talk to your dietitian or surgeon . Your Dietitian and/or surgeon must approve all vitamin and mineral supplements   Activity and Exercise: Limit your physical activity as instructed by your doctor.  It is important to continue walking at home.  During this time, use these guidelines: . Do not lift anything greater than ten (10) pounds for at least two (2) weeks . Do not go back to work or drive until Engineer, production says you can . You may have sex when you feel comfortable  o It is VERY important for female patients to use a reliable birth control method; fertility often increases after surgery  o All hormonal birth control will be ineffective for 30 days after surgery due to medications given during surgery a barrier method must be  used. o Do not get pregnant for at least 18 months . Start exercising as soon as your doctor tells you that you can o Make sure your doctor approves any physical activity . Start with a simple walking program . Walk 5-15 minutes each day, 7 days per week.  . Slowly increase until you are walking 30-45 minutes per day Consider joining our [place] program. [phone] or email [email or web address]   Special Instructions Things to remember: . Use your CPAP when sleeping if this applies to you  . [facility] has two free Bariatric Surgery Support Groups that meet monthly o The 3rd Thursday of each month, 6 pm o The 2nd Friday of each month, 11:30 . It is very important to keep all follow up appointments with your surgeon, dietitian, primary care physician, and behavioral health practitioner . Routine follow up schedule with your surgeon include appointments at 2-3 weeks, 6-8 weeks, 6 months, and 1 year at a minimum.  Your surgeon may request to see you more often.   . After the first year, please follow up with your bariatric surgeon and dietitian at least once a year in order to maintain best weight loss results   [facility]: [place]: [phone] Bariatric Nurse Coordinator: [phone]      Reviewed and Endorsed  by [facility] Patient Education Committee, June, 2016 Edits Approved: Aug, 2018

## 2020-02-07 LAB — CBC WITH DIFFERENTIAL/PLATELET
Abs Immature Granulocytes: 0.05 10*3/uL (ref 0.00–0.07)
Basophils Absolute: 0 10*3/uL (ref 0.0–0.1)
Basophils Relative: 1 %
Eosinophils Absolute: 0.1 10*3/uL (ref 0.0–0.5)
Eosinophils Relative: 1 %
HCT: 41.2 % (ref 36.0–46.0)
Hemoglobin: 13.6 g/dL (ref 12.0–15.0)
Immature Granulocytes: 1 %
Lymphocytes Relative: 30 %
Lymphs Abs: 2.6 10*3/uL (ref 0.7–4.0)
MCH: 30.8 pg (ref 26.0–34.0)
MCHC: 33 g/dL (ref 30.0–36.0)
MCV: 93.2 fL (ref 80.0–100.0)
Monocytes Absolute: 0.7 10*3/uL (ref 0.1–1.0)
Monocytes Relative: 9 %
Neutro Abs: 5.2 10*3/uL (ref 1.7–7.7)
Neutrophils Relative %: 58 %
Platelets: 301 10*3/uL (ref 150–400)
RBC: 4.42 MIL/uL (ref 3.87–5.11)
RDW: 13.4 % (ref 11.5–15.5)
WBC: 8.7 10*3/uL (ref 4.0–10.5)
nRBC: 0 % (ref 0.0–0.2)

## 2020-02-07 LAB — BASIC METABOLIC PANEL
Anion gap: 8 (ref 5–15)
BUN: 10 mg/dL (ref 6–20)
CO2: 24 mmol/L (ref 22–32)
Calcium: 8.6 mg/dL — ABNORMAL LOW (ref 8.9–10.3)
Chloride: 106 mmol/L (ref 98–111)
Creatinine, Ser: 0.93 mg/dL (ref 0.44–1.00)
GFR calc Af Amer: >60 mL/min (ref >60)
GFR calc non Af Amer: >60 mL/min (ref >60)
Glucose, Bld: 98 mg/dL (ref 70–99)
Potassium: 3.4 mmol/L — ABNORMAL LOW (ref 3.5–5.1)
Sodium: 138 mmol/L (ref 135–145)

## 2020-02-07 LAB — MAGNESIUM: Magnesium: 2.1 mg/dL (ref 1.7–2.4)

## 2020-02-07 MED ORDER — PANTOPRAZOLE SODIUM 40 MG PO TBEC
40.0000 mg | DELAYED_RELEASE_TABLET | Freq: Every day | ORAL | 0 refills | Status: DC
Start: 2020-02-07 — End: 2020-04-16

## 2020-02-07 MED ORDER — TRAMADOL HCL 50 MG PO TABS
50.0000 mg | ORAL_TABLET | Freq: Four times a day (QID) | ORAL | 0 refills | Status: DC | PRN
Start: 2020-02-07 — End: 2020-04-16

## 2020-02-07 MED ORDER — POTASSIUM CHLORIDE CRYS ER 20 MEQ PO TBCR: 40.0000 meq | EXTENDED_RELEASE_TABLET | Freq: Once | ORAL | Status: DC

## 2020-02-07 MED ORDER — POTASSIUM CHLORIDE 10 MEQ/100ML IV SOLN: 10.0000 meq | INTRAVENOUS | Status: DC

## 2020-02-07 MED ORDER — ACETAMINOPHEN 500 MG PO TABS: 1000.0000 mg | ORAL_TABLET | Freq: Three times a day (TID) | ORAL | 0 refills | Status: AC

## 2020-02-07 MED ORDER — DOCUSATE SODIUM 100 MG PO CAPS: 100.0000 mg | ORAL_CAPSULE | Freq: Two times a day (BID) | ORAL | 0 refills | Status: DC

## 2020-02-07 MED ORDER — PROCHLORPERAZINE MALEATE 5 MG PO TABS
5.0000 mg | ORAL_TABLET | Freq: Four times a day (QID) | ORAL | 0 refills | Status: DC | PRN
Start: 2020-02-07 — End: 2020-04-16

## 2020-02-07 MED ORDER — ENOXAPARIN SODIUM 40 MG/0.4ML ~~LOC~~ SOLN
40.0000 mg | Freq: Two times a day (BID) | SUBCUTANEOUS | 0 refills | Status: DC
Start: 2020-02-07 — End: 2020-04-16

## 2020-02-07 MED ORDER — ONDANSETRON 4 MG PO TBDP
4.0000 mg | ORAL_TABLET | Freq: Four times a day (QID) | ORAL | 0 refills | Status: DC | PRN
Start: 2020-02-07 — End: 2020-04-16

## 2020-02-07 MED ORDER — GABAPENTIN 100 MG PO CAPS: 200.0000 mg | ORAL_CAPSULE | Freq: Two times a day (BID) | ORAL | 0 refills | Status: DC

## 2020-02-07 MED FILL — GABAPENTIN 100 MG CAPSULE: 100 | 5 days supply | Qty: 20 | Fill #0

## 2020-02-07 MED FILL — PANTOPRAZOLE SOD DR 40 MG T: 40 | 90 days supply | Qty: 90 | Fill #0

## 2020-02-07 MED FILL — ENOXAPARIN SODIUM 40 MG/0.4: 40 | 28 days supply | Qty: 22 | Fill #0

## 2020-02-07 MED FILL — ONDANSETRON ODT 4 MG TABLET: 4 | 5 days supply | Qty: 20 | Fill #0

## 2020-02-07 MED FILL — traMADol HCL 50 MG TABS: 50 | 2 days supply | Qty: 10 | Fill #0

## 2020-02-07 MED FILL — PROCHLORPERAZINE 10 MG TAB: 10 | 3 days supply | Qty: 5 | Fill #0

## 2020-02-07 NOTE — Plan of Care (Signed)
Pt was discharged home today. Instructions were reviewed with patient, and all questions were answered. Pt was taken to main entrance via wheelchair by NT.

## 2020-02-07 NOTE — Progress Notes (Addendum)
MD on call notified of patient's concern of BP being elevated due to amount of IV fluids given. Patient asked if fluids could be decreased or discontinued,  MD gave verbal order to reduce fluid from 125 ml/hr to 100 and patient can have a small break from IV fluids if really needed. Patient wanted to SL IV this morning to take a walk, I informed patient that we would start IV fluids back after walk. Patient is taking oral fluids well and staying hydrated.  Paula King

## 2020-02-07 NOTE — Discharge Summary (Signed)
Physician Discharge Summary  Paula King CZY:[id number] DOB: 09-06-1980 DOA: 02/05/2020  PCP: Alycia Rossetti, MD  Admit date: 02/05/2020 Discharge date: 02/07/2020  Recommendations for Outpatient Follow-up:    Follow-up Information     Clovis Riley, MD. Go on 02/28/2020.   Specialty: General Surgery Why: at 1020 am.  Please arrive 15 minutes prior to appointment.  Thank you Contact information: [address] [place] [address] [phone]         [facility], [place]. Go on 04/03/2020.   Specialty: General Surgery Why: at 910 am with Dr Romana Juniper.  Please arrive 15 minutes prior to surgery time.  Thank you Contact information: [place] [address] [phone]                Discharge Diagnoses:  Active Problems:   Morbid obesity ([place])   Surgical Procedure: Laparoscopic Sleeve Gastrectomy, upper endoscopy  Discharge Condition: Good Disposition: Home  Diet recommendation: Postoperative sleeve gastrectomy diet (liquids only)  Filed Weights   02/05/20 0624  Weight: 118 kg     Hospital Course:  The patient was admitted for a planned laparoscopic sleeve gastrectomy. Please see operative note. Preoperatively the patient was given 5000 units of subcutaneous heparin for DVT prophylaxis. Postoperative prophylactic Lovenox dosing was started on the evening of postoperative day 0. ERAS protocol was used. On the evening of postoperative day 0, the patient was started on water and ice chips. On postoperative day 1 the patient had no fever or tachycardia and was tolerating water in their diet was gradually advanced throughout the day. By postop day 2, her nausea was resolved and she was tolerating liquids and proteins well.The patient was ambulating without difficulty. Their vital signs are stable without fever or tachycardia. Their hemoglobin had remained stable. The patient had received discharge  instructions and counseling. They were deemed stable for discharge and had met discharge criteria   Discharge Instructions  Discharge Instructions     Ambulate hourly while awake   Complete by: As directed    Call MD for:  difficulty breathing, headache or visual disturbances   Complete by: As directed    Call MD for:  persistant dizziness or light-headedness   Complete by: As directed    Call MD for:  persistant nausea and vomiting   Complete by: As directed    Call MD for:  redness, tenderness, or signs of infection (pain, swelling, redness, odor or green/yellow discharge around incision site)   Complete by: As directed    Call MD for:  severe uncontrolled pain   Complete by: As directed    Call MD for:  temperature >101 F   Complete by: As directed    Incentive spirometry   Complete by: As directed    Perform hourly while awake      Allergies as of 02/07/2020       Reactions   Ciprofloxacin    IV Only-Patient started sweating and becoming clammy when was infused with IV cipro on 08/09/14. Can tolerate PO Ciprofloxacin         Medication List     STOP taking these medications    aspirin EC 81 MG tablet   atenolol 25 MG tablet Commonly known as: TENORMIN       TAKE these medications    acetaminophen 500 MG tablet Commonly known as: TYLENOL Take 2 tablets (1,000 mg total) by mouth every 8 (eight) hours for 5 days.  albuterol 108 (90 Base) MCG/ACT inhaler Commonly known as: VENTOLIN HFA Inhale 2 puffs into the lungs every 6 (six) hours as needed for wheezing or shortness of breath.   B-12 1000 MCG Tabs Take 1,000 mcg by mouth daily.   busPIRone 7.5 MG tablet Commonly known as: BUSPAR Take 1 tablet (7.5 mg total) by mouth 2 (two) times daily.   cetirizine 10 MG tablet Commonly known as: ZYRTEC Take 10 mg by mouth at bedtime.   cholecalciferol 1000 units tablet Commonly known as: VITAMIN D Take 1,000 Units by mouth.   docusate sodium 100 MG  capsule Commonly known as: COLACE Take 1 capsule (100 mg total) by mouth 2 (two) times daily.   enoxaparin 40 MG/0.4ML injection Commonly known as: LOVENOX Inject 0.4 mLs (40 mg total) into the skin every 12 (twelve) hours for 28 days.   gabapentin 100 MG capsule Commonly known as: NEURONTIN Take 2 capsules (200 mg total) by mouth every 12 (twelve) hours.   ondansetron 4 MG disintegrating tablet Commonly known as: ZOFRAN-ODT Take 1 tablet (4 mg total) by mouth every 6 (six) hours as needed for nausea or vomiting.   pantoprazole 40 MG tablet Commonly known as: PROTONIX Take 1 tablet (40 mg total) by mouth daily.   prochlorperazine 5 MG tablet Commonly known as: COMPAZINE Take 1 tablet (5 mg total) by mouth every 6 (six) hours as needed for refractory nausea / vomiting.   traMADol 50 MG tablet Commonly known as: ULTRAM Take 1 tablet (50 mg total) by mouth every 6 (six) hours as needed (pain).        Follow-up Information     Clovis Riley, MD. Go on 02/28/2020.   Specialty: General Surgery Why: at 1020 am.  Please arrive 15 minutes prior to appointment.  Thank you Contact information: [address] [place] [address] [phone]         Surgery, [place]. Go on 04/03/2020.   Specialty: General Surgery Why: at 910 am with Dr Romana Juniper.  Please arrive 15 minutes prior to surgery time.  Thank you Contact information: [place] [address] [phone]                  The results of significant diagnostics from this hospitalization (including imaging, microbiology, ancillary and laboratory) are listed below for reference.    Significant Diagnostic Studies: No results found.  Labs: Basic Metabolic Panel: Recent Labs  Lab 02/01/20 1310 02/06/20 0440 02/07/20 0457  NA 140 138 138  K 3.8 3.9 3.4*  CL 107 104 106  CO2 '26 24 24  ' GLUCOSE 98 119* 98  BUN '20 9 10  ' CREATININE 1.20* 1.06*  0.93  CALCIUM 9.4 8.5* 8.6*  MG  --  2.0 2.1   Liver Function Tests: Recent Labs  Lab 02/01/20 1310 02/06/20 0440  AST 26 31  ALT 35 44  ALKPHOS 59 55  BILITOT 0.8 0.8  PROT 7.5 7.1  ALBUMIN 4.2 3.9    CBC: Recent Labs  Lab 02/01/20 1310 02/06/20 0440 02/07/20 0457  WBC 10.3 12.5* 8.7  NEUTROABS 6.6 8.9* 5.2  HGB 14.7 14.1 13.6  HCT 43.6 42.5 41.2  MCV 92.4 94.9 93.2  PLT 364 342 301    CBG: Recent Labs  Lab 02/06/20 1144  GLUCAP 90    Active Problems:   Morbid obesity ([place])    Signed:  Clovis Riley, MD [facility] [place]  02/07/2020, 11:17 AM

## 2020-02-08 ENCOUNTER — Other Ambulatory Visit: Payer: Self-pay | Admitting: *Deleted

## 2020-02-08 NOTE — Patient Outreach (Signed)
[facility]) Care Management  02/08/2020  MERSADIES PETREE 10-22-1980 [id number]   Transition of care telephone call  Referral received:02/05/20 Initial outreach:02/08/20 Insurance: [employer] plan  Initial unsuccessful telephone call to patient's preferred number in order to complete transition of care assessment; no answer, left HIPAA compliant voicemail message requesting return call.   Objective: Per the electronic medical record, Mrs.Sheu  was hospitalized [facility] from 8/10-8/12/21 for Laparoscopic sleeve gastrectomy  . Comorbidities include: Morbid Obesity , He/She was discharged to home on 02/07/20 without the need for home health services or durable medical equipment per the discharge summary.   Plan: This RNCM will route unsuccessful outreach letter with [place] Management pamphlet and 24 hour Nurse Advice Line [place] to [facility] [facility] clinical pool to be mailed to patient's home address. This RNCM will attempt another outreach within 4 business days.   Joylene Draft, RN, BSN  [place] Management Coordinator  [phone]- Mobile [phone]- Toll Free Main Office

## 2020-02-11 ENCOUNTER — Telehealth (HOSPITAL_COMMUNITY): Payer: Self-pay

## 2020-02-13 ENCOUNTER — Other Ambulatory Visit: Payer: Self-pay | Admitting: *Deleted

## 2020-02-13 NOTE — Patient Outreach (Signed)
[facility]) Care Management  02/13/2020  [place] 1980/10/21 [id number]   Transition of care call Referral received: 02/05/20 Initial outreach attempt: 02/08/20 Insurance: [employer]   2nd unsuccessful telephone call to patient's preferred contact number in order to complete post hospital discharge transition of care assessment , no answer left HIPAA compliant message requesting return call.    Objective:  Per the electronic medical record, [place] King  was hospitalized at [facility] from 8/10-8/12/21 for Laparoscopic sleeve gastrectomy  . Comorbidities include: Morbid Obesity , He/She was discharged to home on 02/07/20 without the need for home health services or durable medical equipment per the discharge summary.    Plan If no return call from patient will attempt 3rd outreach in the next 4 business days.   Joylene Draft, RN, BSN  [place] Management Coordinator  [phone]- Mobile [phone]- Toll Free Main Office

## 2020-02-18 ENCOUNTER — Other Ambulatory Visit: Payer: Self-pay | Admitting: *Deleted

## 2020-02-18 NOTE — Patient Outreach (Signed)
[facility]) Care Management  02/18/2020  TASHERA MONTALVO 11-01-1980 [id number]   Transition of care call Referral received: 02/05/20 Initial outreach attempt: 02/08/20 Insurance: [employer]   Third unsuccessful telephone call to patient's preferred contact number in order to complete post hospital discharge transition of care assessment; no answer, left HIPAA compliant message requesting return call.   Objective: Per the electronic medical record, Mrs.Placke  was hospitalized at [facility] from 8/10-8/12/21 for Laparoscopic sleeve gastrectomy  . Comorbidities include: Morbid Obesity , He was discharged to home on 02/07/20 without the need for home health services or durable medical equipment per the discharge summary.  Plan: If no return call from patient, will close case to [place] Management services in 10 business days after initial post hospital discharge outreach, on 02/08/20.  Joylene Draft, RN, BSN  [place] Management Coordinator  [phone]- Mobile [phone]- Toll Free Main Office

## 2020-02-19 ENCOUNTER — Encounter: Payer: No Typology Code available for payment source | Attending: Surgery | Admitting: Skilled Nursing Facility1

## 2020-02-19 ENCOUNTER — Other Ambulatory Visit: Payer: Self-pay

## 2020-02-19 DIAGNOSIS — E669 Obesity, unspecified: Secondary | ICD-10-CM | POA: Diagnosis not present

## 2020-02-19 NOTE — Progress Notes (Addendum)
2 Week Post-Operative Nutrition Class   Patient was seen on 08/22/18 for Post-Operative Nutrition education at the [facility].    Surgery date: 02/05/2020 Surgery type: sleeve Start weight at [facility]: 261 Weight today: 246  Body Composition Scale 02/19/2020  Current Body Weight 246  Total Body Fat % 44.7  Visceral Fat 14  Fat-Free Mass % 55.2   Total Body Water % 42.1  Muscle-Mass lbs 32  BMI 42.7  Body Fat Displacement          Torso  lbs 68.1         Left Leg  lbs 13.6         Right Leg  lbs 13.6         Left Arm  lbs 6.8         Right Arm   lbs 6.8       The following the learning objectives were met by the patient during this course:  Identifies Phase 3 (Soft, High Proteins) Dietary Goals and will begin from 2 weeks post-operatively to 2 months post-operatively  Identifies appropriate sources of fluids and proteins   States protein recommendations and appropriate sources post-operatively  Identifies the need for appropriate texture modifications, mastication, and bite sizes when consuming solids  Identifies appropriate multivitamin and calcium sources post-operatively  Describes the need for physical activity post-operatively and will follow MD recommendations  States when to call healthcare provider regarding medication questions or post-operative complications   Handouts given during class include:  Phase 3A: Soft, High Protein Diet Handout   Follow-Up Plan: Patient will follow-up at [facility] in 6 weeks for 2 month post-op nutrition visit for diet advancement per MD.

## 2020-02-21 ENCOUNTER — Other Ambulatory Visit: Payer: Self-pay | Admitting: *Deleted

## 2020-02-21 NOTE — Patient Outreach (Signed)
[facility]) Care Management  02/21/2020  Paula King June 18, 1981 [id number]   Transition of care /Case Closure Unsuccessful outreach    Referral received:02/05/20 Initial outreach:02/08/20 Insurance: [place]   Unable to complete post hospital discharge transition of care assessment. No return call form patient after 3 call attempts and no response to request to contact RN Care Coordinator in unsuccessful outreach letter mailed to home on 02/08/20.  Objective: Per the electronic medical record, PaulaLathrumwas hospitalized at [facility] from 8/10-8/12/21 for Laparoscopic sleeve gastrectomy. Comorbidities include: Morbid Obesity ,He was discharged to home on 8/12/21without the need for home health services or durable medical equipment per the discharge summary.  Plan Case closed to [employer] as it has been 10 days since initial post discharge outreach attempt.   Joylene Draft, RN, BSN  [place] Management Coordinator  [phone]- Mobile [phone]- Toll Free Main Office

## 2020-02-25 ENCOUNTER — Telehealth: Payer: Self-pay | Admitting: Skilled Nursing Facility1

## 2020-02-25 NOTE — Telephone Encounter (Signed)
RD called pt to verify fluid intake once starting soft, solid proteins 2 week post-bariatric surgery.   Daily Fluid intake: Daily Protein intake:  Concerns/issues:   LVM 

## 2020-02-28 ENCOUNTER — Telehealth: Payer: Self-pay | Admitting: "Endocrinology

## 2020-02-28 NOTE — Telephone Encounter (Signed)
Sharyn Lull is calling from [place] and needs to know from Dec 2020 to June 2021 if the patient was seen to discuss bariatric surgery. CB# [phone]

## 2020-02-28 NOTE — Telephone Encounter (Signed)
Returned call, advised that yes patient was seen here between those dates.

## 2020-03-04 NOTE — Telephone Encounter (Signed)
Paula King would like another return call, for some additional questions.

## 2020-03-05 NOTE — Telephone Encounter (Signed)
Returned call and gave info again.

## 2020-04-02 ENCOUNTER — Ambulatory Visit: Payer: No Typology Code available for payment source | Admitting: Skilled Nursing Facility1

## 2020-04-11 ENCOUNTER — Ambulatory Visit: Payer: No Typology Code available for payment source | Admitting: Family Medicine

## 2020-04-16 ENCOUNTER — Ambulatory Visit (INDEPENDENT_AMBULATORY_CARE_PROVIDER_SITE_OTHER): Payer: 59 | Admitting: Family Medicine

## 2020-04-16 ENCOUNTER — Other Ambulatory Visit: Payer: Self-pay

## 2020-04-16 ENCOUNTER — Encounter: Payer: Self-pay | Admitting: Family Medicine

## 2020-04-16 VITALS — BP 110/72 | HR 85 | Temp 98.7°F | Ht 63.5 in | Wt 228.0 lb

## 2020-04-16 DIAGNOSIS — E669 Obesity, unspecified: Secondary | ICD-10-CM | POA: Diagnosis not present

## 2020-04-16 DIAGNOSIS — F909 Attention-deficit hyperactivity disorder, unspecified type: Secondary | ICD-10-CM | POA: Diagnosis not present

## 2020-04-16 MED ORDER — AMPHETAMINE-DEXTROAMPHETAMINE 15 MG PO TABS: 15.0000 mg | ORAL_TABLET | Freq: Every day | ORAL | 0 refills | Status: DC

## 2020-04-16 NOTE — Patient Instructions (Addendum)
Try the adderall 15mg  once a day  F/U AS NEEDED

## 2020-04-16 NOTE — Assessment & Plan Note (Signed)
Will try low dose adderall 15mg  once a day She will f/u via my chart in a few weeks To see how meds are doing She is going to monitor her blood pressure as well as her heart rate

## 2020-04-16 NOTE — Assessment & Plan Note (Signed)
She is doing well status post gastric sleeve.  She is taking her vitamins and also getting her protein.  She has follow-up with her surgeon in a few weeks.

## 2020-04-16 NOTE — Progress Notes (Signed)
   Subjective:    Patient ID: [place], female    DOB: 06-Jan-1981, 39 y.o.   MRN: [id number]  Patient presents for Follow-up  Patient to follow-up chronic medical problems.  Medications reviewed.  Since her bariatric surgery she states her anxiety has improved.  She also has a need vision.  She went off of her medications with surgery and she has done well.  She however would like to treat her ADD.  Since her blood pressure is normalized and her weight is down she would like to try a different stimulant to see if this will work better.  The Strattera has been making her sleepy.  She does not feel like she needs anything for her anxiety.     Class 2 obesity she is s/p gastric sleeve performed in August, She is protein and veggies and supplements   Review Of Systems:  GEN- denies fatigue, fever, weight loss,weakness, recent illness HEENT- denies eye drainage, change in vision, nasal discharge, CVS- denies chest pain, palpitations RESP- denies SOB, cough, wheeze ABD- denies N/V, change in stools, abd pain GU- denies dysuria, hematuria, dribbling, incontinence MSK- denies joint pain, muscle aches, injury Neuro- denies headache, dizziness, syncope, seizure activity       Objective:    BP 110/72 (BP Location: Right Arm, Patient Position: Sitting, Cuff Size: Large)   Pulse 85   Temp 98.7 F (37.1 C) (Oral)   Ht 5' 3.5" (1.613 m)   Wt 228 lb (103.4 kg)   SpO2 96%   BMI 39.75 kg/m  GEN- NAD, alert and oriented x3 HEENT- PERRL, EOMI, non injected sclera, pink conjunctiva, MMM, oropharynx clear Neck- Supple, no thyromegaly CVS- RRR, no murmur RESP-CTAB ABD-NABS,soft,NT,ND,incision D/C/I Psych normal affect and mood  EXT- No edema Pulses- Radial,  2+        Assessment & Plan:      Problem List Items Addressed This Visit      Unprioritized   ADD (attention deficit disorder)    Will try low dose adderall 15mg  once a day She will f/u via my chart in a few weeks To  see how meds are doing She is going to monitor her blood pressure as well as her heart rate      Class 2 obesity - Primary    She is doing well status post gastric sleeve.  She is taking her vitamins and also getting her protein.  She has follow-up with her surgeon in a few weeks.         Note: This dictation was prepared with Dragon dictation along with smaller phrase technology. Any transcriptional errors that result from this process are unintentional.

## 2020-04-19 ENCOUNTER — Telehealth: Payer: Self-pay | Admitting: Family Medicine

## 2020-04-19 NOTE — Telephone Encounter (Signed)
Received request for PA for amphetamine-dextroamphetamine (ADDERALL) 15 MG tablet Key [id number]

## 2020-04-19 NOTE — Telephone Encounter (Signed)
KEY [id number]

## 2020-04-21 ENCOUNTER — Ambulatory Visit: Payer: No Typology Code available for payment source | Admitting: "Endocrinology

## 2020-04-23 ENCOUNTER — Encounter: Payer: Self-pay | Admitting: "Endocrinology

## 2020-04-23 ENCOUNTER — Ambulatory Visit (INDEPENDENT_AMBULATORY_CARE_PROVIDER_SITE_OTHER): Payer: 59 | Admitting: "Endocrinology

## 2020-04-23 ENCOUNTER — Other Ambulatory Visit: Payer: Self-pay

## 2020-04-23 VITALS — BP 108/72 | HR 56 | Ht 63.5 in | Wt 225.0 lb

## 2020-04-23 DIAGNOSIS — D3501 Benign neoplasm of right adrenal gland: Secondary | ICD-10-CM

## 2020-04-23 DIAGNOSIS — E049 Nontoxic goiter, unspecified: Secondary | ICD-10-CM

## 2020-04-23 NOTE — Progress Notes (Signed)
04/23/2020, 5:19 PM               Endocrinology follow-up note   Subjective:    Patient ID: Paula King, female    DOB: 1981-01-19, PCP Paula Rossetti, MD   Past Medical History:  Diagnosis Date  . COVID-19 virus IgG antibody detected   . 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[place])   . Diverticulitis   . GERD (gastroesophageal reflux disease)   . Hypertension   . Obese   . Pneumonia   . Preeclampsia   . Pulmonary embolism [place])    Past Surgical History:  Procedure Laterality Date  . CESAREAN SECTION    . CHOLECYSTECTOMY    . LAPAROSCOPIC GASTRIC SLEEVE RESECTION N/A 02/05/2020   Procedure: LAPAROSCOPIC SLEEVE GASTRECTOMY;  Surgeon: Paula Riley, MD;  Location: [place];  Service: General;  Laterality: N/A;  . NASAL SEPTOPLASTY W/ TURBINOPLASTY N/A 07/27/2019   Procedure: NASAL SEPTOPLASTY WITH TURBINATE REDUCTION;  Surgeon: Paula Baptist, MD;  Location: [place];  Service: ENT;  Laterality: N/A;  . UPPER GI ENDOSCOPY N/A 02/05/2020   Procedure: UPPER GI ENDOSCOPY;  Surgeon: Paula Riley, MD;  Location: [place];  Service: General;  Laterality: N/A;  . WISDOM TOOTH EXTRACTION     Social History   Socioeconomic History  . Marital status: Single    Spouse name: Not on file  . Number of children: 1  . Years of education: Not on file  . Highest education level: Some college, no degree  Occupational History    Employer: [employer]  Tobacco Use  . Smoking status: Never Smoker  . Smokeless tobacco: Never Used  Vaping Use  . Vaping Use: Never used  Substance and Sexual Activity  . Alcohol use: No  . Drug use: No  . Sexual activity: Yes    Birth control/protection: None  Other Topics Concern  . Not on file  Social History Narrative   Lives in [place], [place].   Single, not dating.    Has one child, age 61, 70.   Has family in [place], [place].   Enjoys swimming.   Enjoys time with family.    Eats all foods.    Wears seatbelt.    Social Determinants of Health   Financial Resource Strain:   . Difficulty of Paying Living Expenses: Not on file  Food Insecurity:   . Worried About Charity fundraiser in the Last Year: Not on file  . Ran Out of Food in the Last Year: Not on file  Transportation Needs:   . Lack of Transportation (Medical): Not on file  . Lack of Transportation (Non-Medical): Not on file  Physical Activity:   . Days of Exercise per Week: Not on file  . Minutes of Exercise per Session: Not on file  Stress:   . Feeling of Stress : Not on file  Social Connections:   . Frequency of Communication with Friends and Family: Not on file  . Frequency of Social Gatherings with Friends and Family: Not on file  . Attends Religious Services: Not on file  . Active Member of Clubs or Organizations: Not on file  . Attends Archivist Meetings: Not on file  . Marital Status:  Not on file   Outpatient Encounter Medications as of 04/23/2020  Medication Sig  . Multiple Vitamins-Minerals (BARIATRIC MULTIVITAMINS/IRON PO) Take 1 tablet by mouth daily.  Marland Kitchen amphetamine-dextroamphetamine (ADDERALL) 15 MG tablet Take 1 tablet by mouth daily.  . cetirizine (ZYRTEC) 10 MG tablet Take 10 mg by mouth at bedtime.  . cholecalciferol (VITAMIN D) 1000 units tablet Take 1,000 Units by mouth.   . Cyanocobalamin (B-12) 1000 MCG TABS Take 1,000 mcg by mouth daily.    No facility-administered encounter medications on file as of 04/23/2020.   ALLERGIES: Allergies  Allergen Reactions  . Ciprofloxacin     IV Only-Patient started sweating and becoming clammy when was infused with IV cipro on 08/09/14. Can tolerate PO Ciprofloxacin     VACCINATION STATUS: Immunization History  Administered Date(s) Administered  . Influenza,inj,Quad PF,6+ Mos 04/26/2019  . Influenza-Unspecified 04/03/2020    HPI Paula King is 39 y.o. female who presents today to follow-up after her laparoscopic sleeve gastrectomy  on February 05, 2020.   -She also has a stable right adrenal adenoma on follow-up.  She is recovering very well from her surgery.  She lost 44 pounds already. She feels better, has no new complaints today.  - Her history starts in February 2016 when she underwent CT scan of her abdomen due to left lower quadrant pain which incidentally showed 2.7 cm mass  on her right adrenal gland which was reported to be consistent with adenoma. - Repeat CT abdomen in November 2018 revealed that the adenoma persisted and measured 3 cm ( HU 0) still favoring adenoma. -More recently on March 24, 2019, she underwent CT angiogram related to work-up in the hospital when she was admitted for COVID-19, the right adrenal adenoma was found to be measuring 3.2cm. -She was exposed to large amounts of steroids during the course of her illness including dexamethasone. -Previously ,she underwent hormonal work-up to determine functional status of this adenoma.  Findings have not been conclusive, except for one instance of 24-hour urine free cortisol marginally elevated at 59. -Her subsequent functional studies were within normal limits, did not require definitive intervention and was put on observation with plan to repeat plasma free metanephrines, A1c, and thyroid function tests.  These are all within normal limits.  -Before this visit, she underwent 1 mg overnight dexamethasone suppression test with the next morning serum cortisol being 3.4 ng per DL.  -After her recent laparoscopic sleeve gastrectomy, she was taken off of her blood pressure medications. - She has 1 child, 40 years of age. - She denies diaphoresis, uncontrolled hypertension, headaches.  Review of Systems  Limited as above.  Objective:    BP 108/72   Pulse (!) 56   Ht 5' 3.5" (1.613 m)   Wt 225 lb (102.1 kg)   BMI 39.23 kg/m   Wt Readings from Last 3 Encounters:  04/23/20 225 lb (102.1 kg)  04/16/20 228 lb (103.4 kg)  02/19/20 246 lb (111.6 kg)     Physical Exam   CMP ( most recent) CMP     Component Value Date/Time   NA 138 02/07/2020 0457   K 3.4 (L) 02/07/2020 0457   CL 106 02/07/2020 0457   CO2 24 02/07/2020 0457   GLUCOSE 98 02/07/2020 0457   BUN 10 02/07/2020 0457   CREATININE 0.93 02/07/2020 0457   CREATININE 0.98 07/18/2019 1505   CALCIUM 8.6 (L) 02/07/2020 0457   PROT 7.1 02/06/2020 0440   ALBUMIN 3.9 02/06/2020 0440   AST  31 02/06/2020 0440   ALT 44 02/06/2020 0440   ALKPHOS 55 02/06/2020 0440   BILITOT 0.8 02/06/2020 0440   GFRNONAA >60 02/07/2020 0457   GFRNONAA 64 04/18/2017 1041   GFRAA >60 02/07/2020 0457   GFRAA 74 04/18/2017 1041     Diabetic Labs (most recent): Lab Results  Component Value Date   HGBA1C 5.4 07/18/2019   HGBA1C 5.9 (H) 02/16/2019   HGBA1C 5.6 12/13/2018     Lipid Panel ( most recent) Lipid Panel     Component Value Date/Time   CHOL 147 05/24/2018 1133   TRIG 134 03/06/2019 2132   HDL 43 (L) 05/24/2018 1133   CHOLHDL 3.4 05/24/2018 1133   LDLCALC 84 05/24/2018 1133       Lab Results  Component Value Date   TSH 2.620 12/13/2018   TSH 1.83 04/18/2017   FREET4 1.06 12/13/2018      Most recent 24-hour urine free cortisol on November 28, 2017 was 14 improving from 81.  05/10/2017   CT abdomen  Radiology description of her right adrenal mass: Adrenal: Previously noted right adrenal mass is similar to the prior examination measuring 3.0 x 2.6 cm and is diffusely low-attenuation (0 HU), compatible with an adenoma. Left adrenal gland and bilateral kidneys are normal in appearance. No hydroureteronephrosis in the visualized portions of the abdomen.   March 24, 2019, 3.2 cm right adrenal adenoma redemonstrated.   Assessment & Plan:   1. Adrenal adenoma, right  -3.2cm slowly growing stable adenoma.  Radiologic features favoring benign adenoma. - a series of functional studies have been normal-unremarkable.  Patient has difficulty losing weight, see below.   Her most recent overnight dexamethasone suppression test did not exclude Cushing syndrome.  She did have normal plasma metanephrines in 2019.   -She will have repeat 24-hour urine free cortisol as well as plasma metanephrines before her next visit in 6 months.  2) morbid obesity -She is status post laparoscopic sleeve gastrectomy achieving 44 pounds of weight loss. This is a great improvement for her. - she  admits there is a room for improvement in her diet and drink choices. -  Suggestion is made for her to avoid simple carbohydrates  from her diet including Cakes, Sweet Desserts / Pastries, Ice Cream, Soda (diet and regular), Sweet Tea, Candies, Chips, Cookies, Sweet Pastries,  Store Bought Juices, Alcohol in Excess of  1-2 drinks a day, Artificial Sweeteners, Coffee Creamer, and "Sugar-free" Products. This will help patient to have stable blood glucose profile and potentially avoid unintended weight gain.    3. Goiter - She does not have family history of thyroid cancer.  She has significant thyromegaly with no discrete nodules, euthyroid based on her thyroid function tests, no intervention at this time.  She will have repeat thyroid ultrasound before her next visit.   - I advised patient to maintain close follow up with Paula Rossetti, MD for primary care needs.     - Time spent on this patient care encounter:  20 minutes of which 50% was spent in  counseling and the rest reviewing  her current and  previous labs / studies and medications  doses and developing a plan for long term care. Meta Hatchet Gentzler  participated in the discussions, expressed understanding, and voiced agreement with the above plans.  All questions were answered to her satisfaction. she is encouraged to contact clinic should she have any questions or concerns prior to her return visit.     Follow  up plan: Return in about 6 months (around 10/22/2020) for F/U with Pre-visit Labs, Thyroid / Neck Ultrasound.   Glade Lloyd, MD [facility] [address] [place], [place] [address] Phone: [phone]  Fax: [phone]     04/23/2020, 5:19 PM  This note was partially dictated with voice recognition software. Similar sounding words can be transcribed inadequately or may not  be corrected upon review.

## 2020-04-24 ENCOUNTER — Other Ambulatory Visit: Payer: Self-pay

## 2020-04-28 NOTE — Telephone Encounter (Signed)
PA submitted and approved through 04/23/2020 - 04/23/2023.  Pharmacy to be made aware.

## 2020-05-05 ENCOUNTER — Encounter: Payer: Self-pay | Admitting: Family Medicine

## 2020-05-06 MED ORDER — NORETHINDRONE 0.35 MG PO TABS
1.0000 | ORAL_TABLET | Freq: Every day | ORAL | 3 refills | Status: DC
Start: 2020-05-06 — End: 2020-11-06

## 2020-05-14 ENCOUNTER — Ambulatory Visit (HOSPITAL_COMMUNITY)
Admission: RE | Admit: 2020-05-14 | Discharge: 2020-05-14 | Disposition: A | Discharge status: Home / Self Care | Payer: 59 | Source: Ambulatory Visit | Attending: "Endocrinology | Admitting: "Endocrinology

## 2020-05-14 ENCOUNTER — Other Ambulatory Visit: Payer: Self-pay

## 2020-05-14 ENCOUNTER — Ambulatory Visit (HOSPITAL_COMMUNITY): Admission: RE | Admit: 2020-05-14 | Payer: 59 | Source: Ambulatory Visit

## 2020-05-14 DIAGNOSIS — E049 Nontoxic goiter, unspecified: Secondary | ICD-10-CM | POA: Diagnosis not present

## 2020-05-14 IMAGING — US US THYROID
1 series · 14 of 25 positions shown · non-contrast
Comparison: [DATE], [DATE]

CLINICAL DATA: 39-year-old female with a history of thyroid goiter

EXAM:
THYROID ULTRASOUND
TECHNIQUE: Ultrasound examination of the thyroid gland and adjacent soft
tissues was performed.

[Series 1: us thyroid · 14 of 51 slices shown]
[im 1/51]
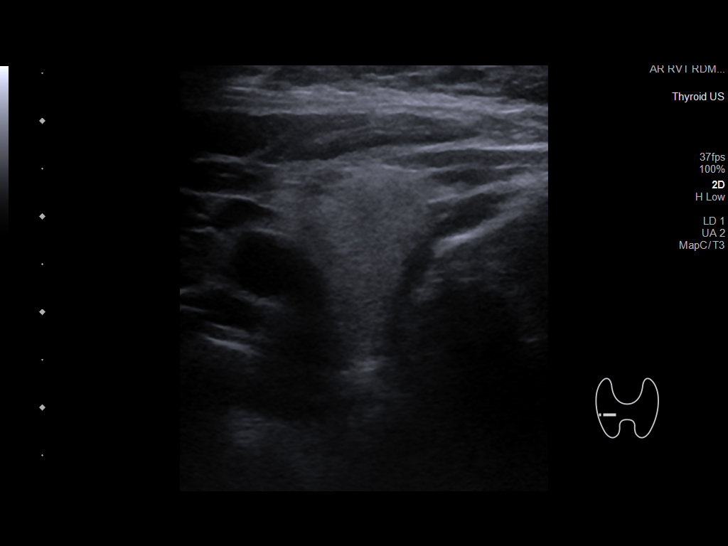
[im 5/51]
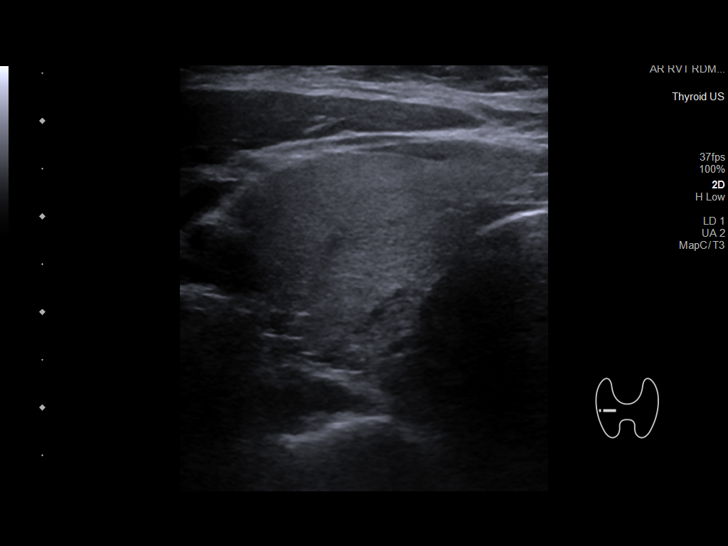
[im 9/51]
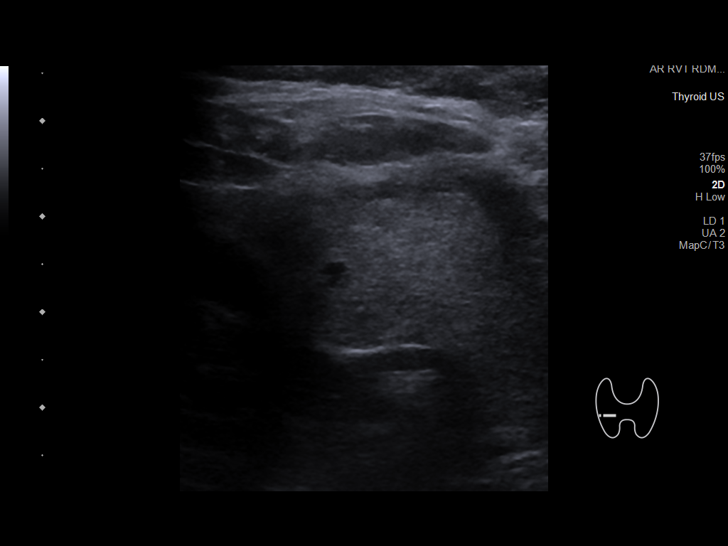
[im 13/51]
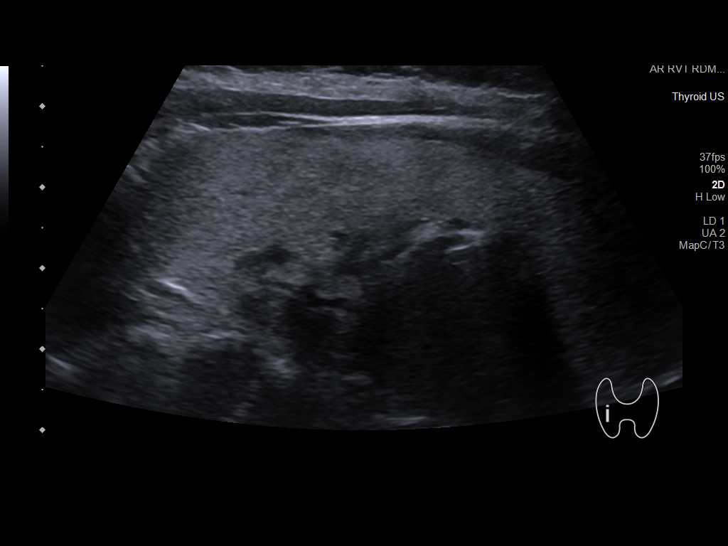
[im 17/51]
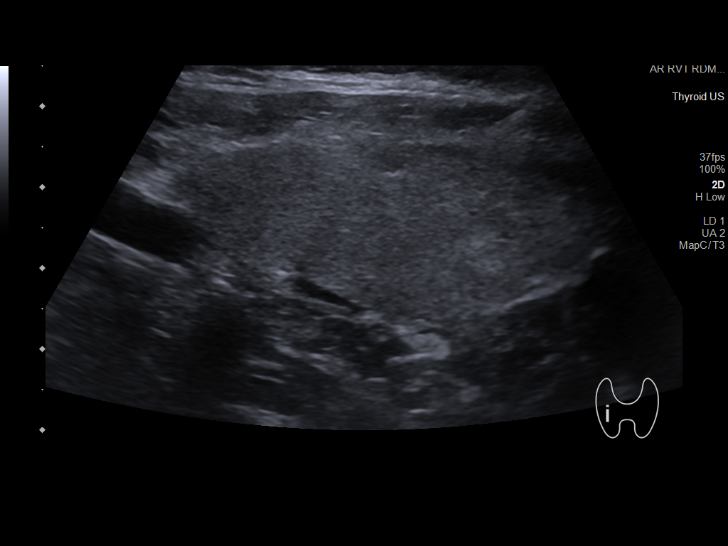
[im 19/51]
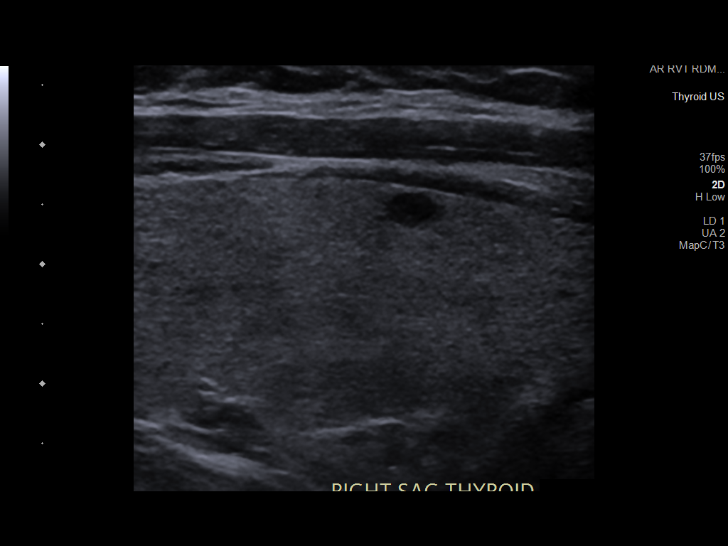
[im 23/51]
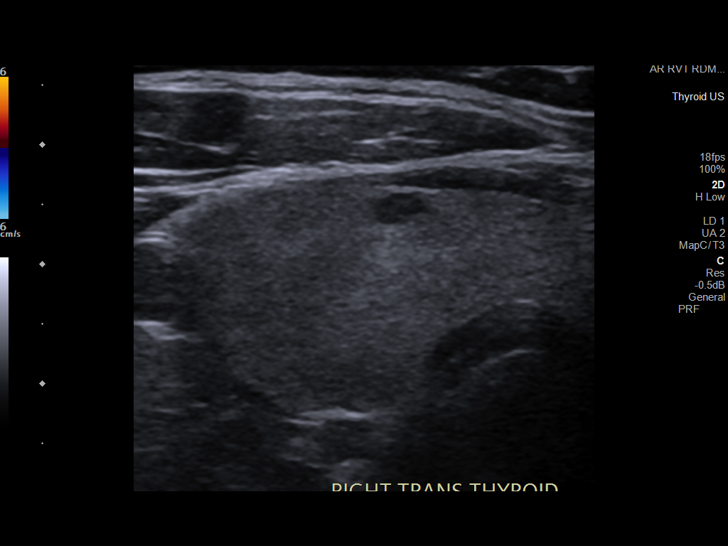
[im 28/51]
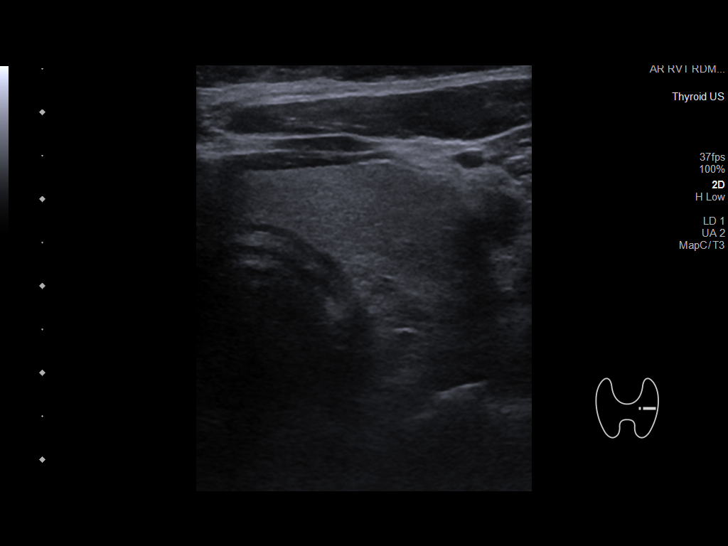
[im 32/51]
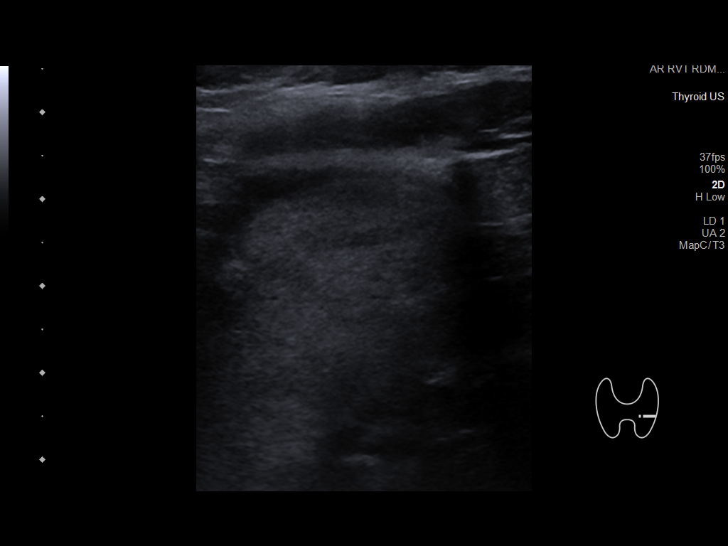
[im 34/51]
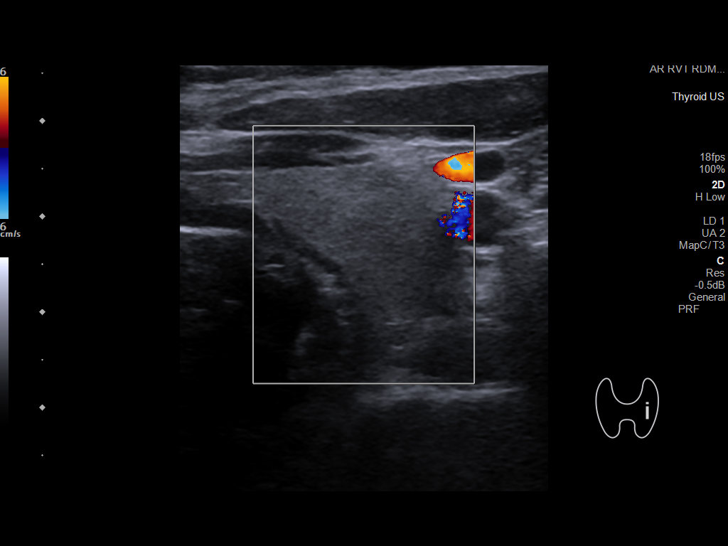
[im 38/51]
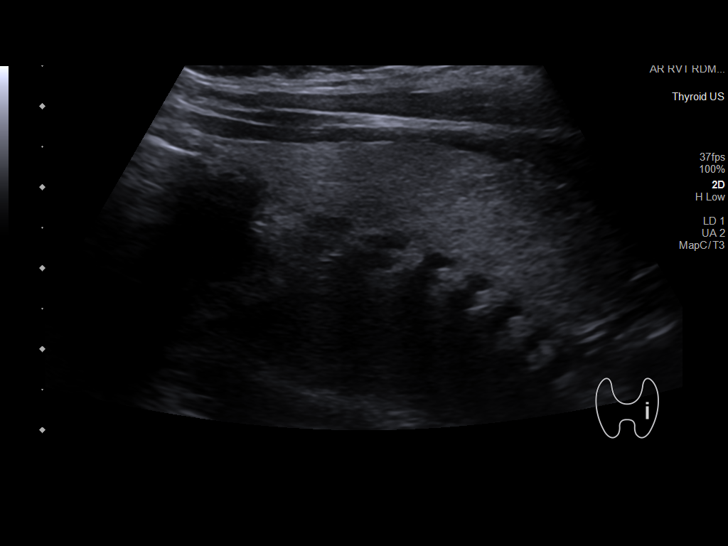
[im 42/51]
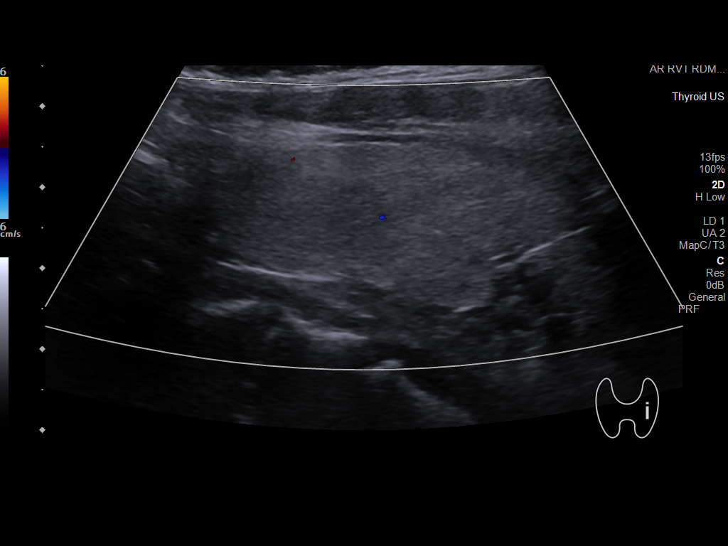
[im 46/51]
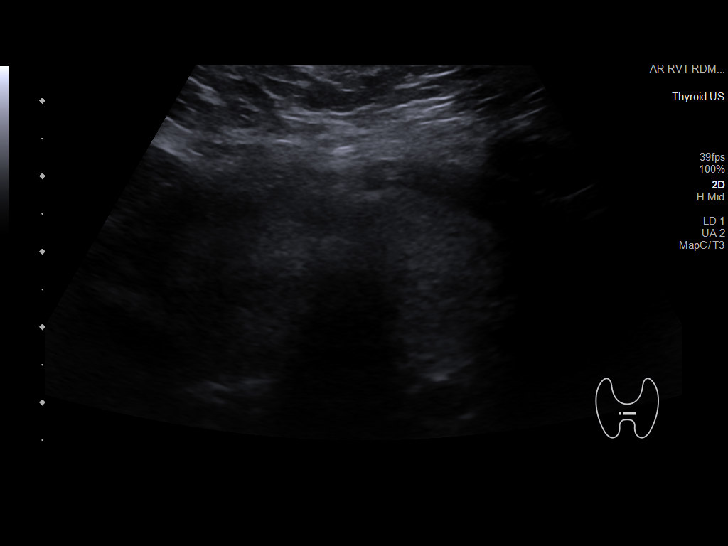
[im 51/51]
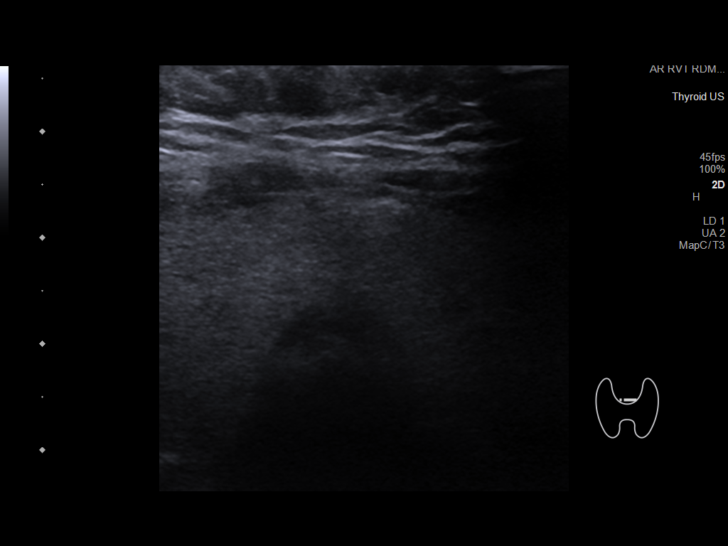

[14 of 25 positions shown; findings below may reference images not displayed]

FINDINGS: Parenchymal Echotexture: Normal

Isthmus: 0.9 cm

Right lobe: 6.5 cm x 2.8 cm x 2.5 cm

Left lobe: 5.5 cm x 2.5 cm x 2.1 cm

_________________________________________________________

Estimated total number of nodules >/= 1 cm: 0

Number of spongiform nodules >/=  2 cm not described below (TR1): 0

Number of mixed cystic and solid nodules >/= 1.5 cm not described
below (TR2): 0

_________________________________________________________

Cystic nodule in the right thyroid does not meet criteria for
surveillance or biopsy.

No adenopathy

Recommendations follow those established by the new ACR TI-RADS
criteria ([HOSPITAL] [RI];[DATE]).
IMPRESSION: Ultrasound survey demonstrates persisting thyromegaly.

## 2020-05-17 LAB — METANEPHRINES, PLASMA
Metanephrine, Free: 15.9 pg/mL (ref 0.0–88.0)
Normetanephrine, Free: 69.1 pg/mL (ref 0.0–210.1)

## 2020-07-30 ENCOUNTER — Other Ambulatory Visit: Payer: 59 | Admitting: Women's Health

## 2020-08-25 ENCOUNTER — Encounter: Payer: Self-pay | Admitting: Emergency Medicine

## 2020-08-25 ENCOUNTER — Other Ambulatory Visit: Payer: Self-pay

## 2020-08-25 ENCOUNTER — Ambulatory Visit
Admission: EM | Admit: 2020-08-25 | Discharge: 2020-08-25 | Disposition: A | Discharge status: Home / Self Care | Payer: 59 | Attending: Family Medicine | Admitting: Family Medicine

## 2020-08-25 DIAGNOSIS — N760 Acute vaginitis: Secondary | ICD-10-CM | POA: Diagnosis present

## 2020-08-25 MED ORDER — METRONIDAZOLE 500 MG PO TABS
500.0000 mg | ORAL_TABLET | Freq: Two times a day (BID) | ORAL | 0 refills | Status: DC
Start: 2020-08-25 — End: 2020-08-26

## 2020-08-25 NOTE — ED Triage Notes (Signed)
Pt is having vaginal discharge w/ odor that started x 1 week ago.  Pt has hx BV .

## 2020-08-25 NOTE — ED Provider Notes (Signed)
[facility]    CSN: [id number] Arrival date & time: 08/25/20  1745      History   Chief Complaint Chief Complaint  Patient presents with  . Vaginal Discharge    HPI Paula King is a 40 y.o. female.   HPI Patient presents today with vaginal odor and changes in consistency of vaginal discharge. Denies urinary related symptoms or vaginal burning. History of bacterial vaginosis which responded in the past to metronidazole.  Past Medical History:  Diagnosis Date  . COVID-19 virus IgG antibody detected   . 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[place])   . Diverticulitis   . GERD (gastroesophageal reflux disease)   . Hypertension   . Obese   . Pneumonia   . Preeclampsia   . Pulmonary embolism [facility])     Patient Active Problem List   Diagnosis Date Noted  . Situational anxiety 01/11/2020  . GERD (gastroesophageal reflux disease) 03/13/2019  . Pulmonary embolism ([place]) 03/13/2019  . History of COVID-19 03/07/2019  . History of COVID -19 INFECTION 02/11/2019  . Hypokalemia 02/11/2019  . Abnormal liver function 02/11/2019  . H/O severe pre-eclampsia 03/17/2018  . Tachycardia 03/17/2018  . Chronic hypertension during pregnancy, antepartum 03/17/2018  . ADD (attention deficit disorder) 08/22/2017  . Cushings syndrome ([place]) 08/16/2017  . Goiter 07/19/2017  . Class 2 obesity 07/19/2017  . Diverticulitis of colon 08/09/2014  . Abdominal pain 08/09/2014  . Adrenal adenoma, right 08/09/2014  . Diverticulitis 08/09/2014  . Elevated transaminase level 08/09/2014  . Preeclampsia 10/17/2010  . History of cholecystectomy 12/27/2002    Past Surgical History:  Procedure Laterality Date  . CESAREAN SECTION    . CHOLECYSTECTOMY    . LAPAROSCOPIC GASTRIC SLEEVE RESECTION N/A 02/05/2020   Procedure: LAPAROSCOPIC SLEEVE GASTRECTOMY;  Surgeon: Clovis Riley, MD;  Location: [facility];  Service: General;  Laterality: N/A;  . NASAL SEPTOPLASTY W/ TURBINOPLASTY N/A 07/27/2019   Procedure:  NASAL SEPTOPLASTY WITH TURBINATE REDUCTION;  Surgeon: Leta Baptist, MD;  Location: [facility];  Service: ENT;  Laterality: N/A;  . UPPER GI ENDOSCOPY N/A 02/05/2020   Procedure: UPPER GI ENDOSCOPY;  Surgeon: Clovis Riley, MD;  Location: [facility];  Service: General;  Laterality: N/A;  . WISDOM TOOTH EXTRACTION      OB History    Gravida  5   Para  1   Term      Preterm  1   AB  3   Living  1     SAB  1   IAB      Ectopic      Multiple      Live Births  1            Home Medications    Prior to Admission medications   Medication Sig Start Date End Date Taking? Authorizing Provider  metroNIDAZOLE (FLAGYL) 500 MG tablet Take 1 tablet (500 mg total) by mouth 2 (two) times daily with a meal. DO NOT CONSUME ALCOHOL WHILE TAKING THIS MEDICATION. 08/25/20  Yes Scot Jun, FNP  amphetamine-dextroamphetamine (ADDERALL) 15 MG tablet Take 1 tablet by mouth daily. 04/16/20   Alycia Rossetti, MD  cetirizine (ZYRTEC) 10 MG tablet Take 10 mg by mouth at bedtime.    [provider]  cholecalciferol (VITAMIN D) 1000 units tablet Take 1,000 Units by mouth.  12/26/17   [provider]  Cyanocobalamin (B-12) 1000 MCG TABS Take 1,000 mcg by mouth daily.  12/26/17   [provider]  Multiple Vitamins-Minerals (BARIATRIC MULTIVITAMINS/IRON PO) Take 1 tablet by mouth daily.    [provider]  norethindrone (ORTHO MICRONOR) 0.35 MG tablet Take 1 tablet (0.35 mg total) by mouth daily. 05/06/20   Alycia Rossetti, MD    Family History Family History  Problem Relation Age of Onset  . Hypertension Father   . Diabetes Father   . Anxiety disorder Mother   . Depression Mother   . Cancer Sister        throat   . Hypertension Paternal Grandmother   . Hypertension Maternal Grandmother   . Asthma Daughter   . Cancer Maternal Aunt        Breast cancer    Social History Social History   Tobacco Use  . Smoking status: Never Smoker  .  Smokeless tobacco: Never Used  Vaping Use  . Vaping Use: Never used  Substance Use Topics  . Alcohol use: No  . Drug use: No     Allergies   Ciprofloxacin   Review of Systems Review of Systems Pertinent negatives listed in HPI Physical Exam Triage Vital Signs ED Triage Vitals [08/25/20 1800]  Enc Vitals Group     BP 124/86     Pulse Rate 75     Resp 18     Temp 98.9 F (37.2 C)     Temp Source Oral     SpO2 98 %     Weight 200 lb (90.7 kg)     Height      Head Circumference      Peak Flow      Pain Score      Pain Loc      Pain Edu?      Excl. in [place]?    No data found.  Updated Vital Signs BP 124/86 (BP Location: Right Arm)   Pulse 75   Temp 98.9 F (37.2 C) (Oral)   Resp 18   Wt 200 lb (90.7 kg)   SpO2 98%   BMI 34.87 kg/m  Visual Acuity Right Eye Distance:   Left Eye Distance:   Bilateral Distance:    Right Eye Near:   Left Eye Near:    Bilateral Near:     Physical Exam General appearance: alert, well developed, well nourished, cooperative  Head: Normocephalic, without obvious abnormality, atraumatic Respiratory: Respirations even and unlabored, normal respiratory rate Heart: Rate and rhythm normal.  Extremities: No gross deformities Skin: Skin color, texture, turgor normal. No rashes seen  Psych: Appropriate mood and affect. Neurologic: GCS 15, normal coordination, normal gait Vaginal cytology self collected UC Treatments / Results  Labs (all labs ordered are listed, but only abnormal results are displayed) Labs Reviewed  CERVICOVAGINAL ANCILLARY ONLY    EKG   Radiology No results found.  Procedures Procedures (including critical care time)  Medications Ordered in UC Medications - No data to display  Initial Impression / Assessment and Plan / UC Course  I have reviewed the triage vital signs and the nursing notes.  Pertinent labs & imaging results that were available during my care of the patient were reviewed by me and  considered in my medical decision making (see chart for details).     Acute vaginitis.  Vaginal cytology pending.  Covering with Flagyl 500 mg twice daily for total of 7 days.  If any additional treatment is warranted upon receipt of cytology patient will be notified via [place]. Final Clinical Impressions(s) / UC Diagnoses   Final diagnoses:  Vaginitis and  vulvovaginitis   Discharge Instructions   None    ED Prescriptions    Medication Sig Dispense Auth. Provider   metroNIDAZOLE (FLAGYL) 500 MG tablet Take 1 tablet (500 mg total) by mouth 2 (two) times daily with a meal. DO NOT CONSUME ALCOHOL WHILE TAKING THIS MEDICATION. 14 tablet Scot Jun, FNP     PDMP not reviewed this encounter.   Scot Jun, FNP 08/25/20 [phone]

## 2020-08-26 ENCOUNTER — Telehealth: Payer: Self-pay | Admitting: Emergency Medicine

## 2020-08-26 LAB — CERVICOVAGINAL ANCILLARY ONLY
Bacterial Vaginitis (gardnerella): POSITIVE — AB
Candida Glabrata: NEGATIVE
Candida Vaginitis: POSITIVE — AB
Chlamydia: NEGATIVE
Comment: NEGATIVE
Comment: NEGATIVE
Comment: NEGATIVE
Comment: NEGATIVE
Comment: NEGATIVE
Comment: NORMAL
Neisseria Gonorrhea: NEGATIVE
Trichomonas: NEGATIVE

## 2020-08-26 MED ORDER — METRONIDAZOLE 500 MG PO TABS: 500.0000 mg | ORAL_TABLET | Freq: Two times a day (BID) | ORAL | 0 refills | Status: DC

## 2020-08-27 ENCOUNTER — Other Ambulatory Visit (HOSPITAL_COMMUNITY): Payer: Self-pay | Admitting: Internal Medicine

## 2020-08-27 ENCOUNTER — Telehealth (HOSPITAL_COMMUNITY): Payer: Self-pay | Admitting: Emergency Medicine

## 2020-08-27 MED ORDER — FLUCONAZOLE 150 MG PO TABS
150.0000 mg | ORAL_TABLET | Freq: Once | ORAL | 0 refills | Status: DC
Start: 2020-08-27 — End: 2020-08-27

## 2020-08-27 MED FILL — FLUCONAZOLE 150 MG TABS: 150 | 3 days supply | Qty: 2 | Fill #0

## 2020-08-28 ENCOUNTER — Telehealth: Payer: Self-pay | Admitting: Emergency Medicine

## 2020-08-28 MED ORDER — FLUCONAZOLE 150 MG PO TABS: 150.0000 mg | ORAL_TABLET | Freq: Every day | ORAL | 0 refills | Status: AC

## 2020-10-20 ENCOUNTER — Other Ambulatory Visit (HOSPITAL_COMMUNITY): Payer: Self-pay

## 2020-10-20 ENCOUNTER — Other Ambulatory Visit: Payer: Self-pay

## 2020-10-20 DIAGNOSIS — D3501 Benign neoplasm of right adrenal gland: Secondary | ICD-10-CM

## 2020-10-20 MED ORDER — PHENTERMINE HCL 37.5 MG PO TABS
ORAL_TABLET | ORAL | 1 refills | Status: DC
Start: 2020-10-16 — End: 2020-11-06
  Filled 2020-10-20: qty 90, 90d supply, fill #0

## 2020-10-21 ENCOUNTER — Other Ambulatory Visit: Payer: Self-pay | Admitting: "Endocrinology

## 2020-10-21 ENCOUNTER — Telehealth: Payer: Self-pay

## 2020-10-21 DIAGNOSIS — D3501 Benign neoplasm of right adrenal gland: Secondary | ICD-10-CM

## 2020-10-21 NOTE — Telephone Encounter (Signed)
Dr Georgia Dom would like this pt to follow back up with you. She is going to do the urine but what labs does she need? She said she normally takes a pill before the labs, please advise on what I need to advise her on.  Thanks

## 2020-10-21 NOTE — Telephone Encounter (Signed)
Pt notified. She will call back for an appt once completed

## 2020-10-21 NOTE — Telephone Encounter (Signed)
Ok.  Will see her. She wont need the pill which she did before for overnight dexa suppression test. For this visit we need 24 hour urine cortisol and plasma metanephrines. I ordered the plasma metanephrines. I think the 24 hour urine cortisol is in process.

## 2020-10-22 ENCOUNTER — Ambulatory Visit: Payer: 59 | Admitting: Nurse Practitioner

## 2020-10-28 ENCOUNTER — Ambulatory Visit
Admission: RE | Admit: 2020-10-28 | Discharge: 2020-10-28 | Disposition: A | Discharge status: Home / Self Care | Payer: Medicaid Other | Source: Ambulatory Visit | Attending: Family Medicine | Admitting: Family Medicine

## 2020-10-28 ENCOUNTER — Other Ambulatory Visit: Payer: Self-pay

## 2020-10-28 VITALS — BP 112/78 | HR 72 | Temp 98.8°F | Resp 17 | Wt 200.0 lb

## 2020-10-28 DIAGNOSIS — M545 Low back pain, unspecified: Secondary | ICD-10-CM | POA: Diagnosis not present

## 2020-10-28 DIAGNOSIS — M62830 Muscle spasm of back: Secondary | ICD-10-CM

## 2020-10-28 MED ORDER — DEXAMETHASONE SODIUM PHOSPHATE 10 MG/ML IJ SOLN
10.0000 mg | Freq: Once | INTRAMUSCULAR | Status: AC
  Administered 2020-10-28: 10 mg via INTRAMUSCULAR

## 2020-10-28 MED ORDER — CYCLOBENZAPRINE HCL 10 MG PO TABS
ORAL_TABLET | ORAL | 0 refills | Status: DC
Start: 2020-10-28 — End: 2020-12-10

## 2020-10-28 MED ORDER — KETOROLAC TROMETHAMINE 60 MG/2ML IM SOLN
60.0000 mg | Freq: Once | INTRAMUSCULAR | Status: AC
  Administered 2020-10-28: 60 mg via INTRAMUSCULAR

## 2020-10-28 NOTE — ED Triage Notes (Signed)
Low back pain since Sunday morning after moving a mattress the night before.

## 2020-10-28 NOTE — Discharge Instructions (Addendum)
Meds ordered this encounter  Medications   dexamethasone (DECADRON) injection 10 mg   ketorolac (TORADOL) injection 60 mg   cyclobenzaprine (FLEXERIL) 10 MG tablet    Sig: Take 1 tablet by mouth 3 times daily as needed for muscle spasm. Warning: May cause drowsiness.    Dispense:  21 tablet    Refill:  0   HOME CARE INSTRUCTIONS: For many people, back pain returns. Since low back pain is rarely dangerous, it is often a condition that people can learn to manage on their own. Please remain active. It is stressful on the back to sit or stand in one place. Do not sit, drive, or stand in one place for more than 30 minutes at a time. Take short walks on level surfaces as soon as pain allows. Try to increase the length of time you walk each day. Do not stay in bed. Resting more than 1 or 2 days can delay your recovery. Do not avoid exercise or work. Your body is made to move. It is not dangerous to be active, even though your back may hurt. Your back will likely heal faster if you return to being active before your pain is gone. Over-the-counter medicines to reduce pain and inflammation are often the most helpful.  SEEK MEDICAL CARE IF: You have pain that is not relieved with rest or medicine. You have pain that does not improve in 1 week. You have new symptoms. You are generally not feeling well.  SEEK IMMEDIATE MEDICAL CARE IF: You have pain that radiates from your back into your legs. You develop new bowel or bladder control problems. You have unusual weakness or numbness in your arms or legs. You develop nausea or vomiting. You develop abdominal pain. You feel faint.

## 2020-10-28 NOTE — ED Provider Notes (Signed)
[facility]   [id number] 10/28/20 Arrival Time: 2993  ASSESSMENT & PLAN:  1. Acute right-sided low back pain without sciatica   2. Muscle spasm of back    See AVS for discharge information/instructins.  Able to ambulate here and hemodynamically stable. No indication for imaging of back at this time given no trauma and normal neurological exam. Discussed.  Meds ordered this encounter  Medications  . dexamethasone (DECADRON) injection 10 mg  . ketorolac (TORADOL) injection 60 mg  . cyclobenzaprine (FLEXERIL) 10 MG tablet    Sig: Take 1 tablet by mouth 3 times daily as needed for muscle spasm. Warning: May cause drowsiness.    Dispense:  21 tablet    Refill:  0   Medication sedation precautions given. Encourage ROM/movement as tolerated.  Recommend:  Follow-up Information    [facility].   Why: If worsening or failing to improve as anticipated. Contact information: [address] [place] [phone]              Reviewed expectations re: course of current medical issues. Questions answered. Outlined signs and symptoms indicating need for more acute intervention. Patient verbalized understanding. After Visit Summary given.   SUBJECTIVE: History from: patient.  Paula King is a 40 y.o. female who presents with complaint of persistent right sided lower back discomfort. Onset abrupt. First noted 2 d ago. Injury/trama: reports pain with lifting mattress at home; no direct trauma. History of back problems requiring medical care: rare. Pain described as aching and without radiation. Aggravating factors: prolonged walking/standing. Alleviating factors: rest. Progressive LE weakness or saddle anesthesia: none. Extremity sensation changes or weakness: none. Ambulatory without difficulty. Normal bowel/bladder habits: yes; with urinary retention. Normal PO intake without n/v. No associated abdominal  pain/n/v. Self treatment: has acetaminophen, NSAID, with minimal relief.  Reports no chronic steroid use, fevers, IV drug use, or recent back surgeries or procedures.   OBJECTIVE:  Vitals:   10/28/20 0857 10/28/20 0858  BP:  112/78  Pulse:  72  Resp:  17  Temp:  98.8 F (37.1 C)  TempSrc:  Oral  SpO2:  98%  Weight: 90.7 kg     General appearance: alert; no distress HEENT: [place]; AT Neck: supple with FROM; without midline tenderness CV: regular Lungs: unlabored respirations; speaks full sentences without difficulty Abdomen: soft, non-tender; non-distended Back: moderate and poorly localized tenderness to palpation over left lumbar paraspinal musculature; FROM at waist; bruising: none; without midline tenderness Extremities: without edema; symmetrical without gross deformities; normal ROM of bilateral LE Skin: warm and dry Neurologic: normal gait; normal sensation and strength of bilateral LE Psychological: alert and cooperative; normal mood and affect   Allergies  Allergen Reactions  . Ciprofloxacin     IV Only-Patient started sweating and becoming clammy when was infused with IV cipro on 08/09/14. Can tolerate PO Ciprofloxacin     Past Medical History:  Diagnosis Date  . COVID-19 virus IgG antibody detected   . 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[place])   . Diverticulitis   . GERD (gastroesophageal reflux disease)   . Hypertension   . Obese   . Pneumonia   . Preeclampsia   . Pulmonary embolism [facility])    Social History   Socioeconomic History  . Marital status: Single    Spouse name: Not on file  . Number of children: 1  . Years of education: Not on file  . Highest education level: Some college, no degree  Occupational  History    Employer:   Tobacco Use  . Smoking status: Never Smoker  . Smokeless tobacco: Never Used  Vaping Use  . Vaping Use: Never used  Substance and Sexual Activity  . Alcohol use: No  . Drug use: No  . Sexual activity: Yes    Birth  control/protection: None  Other Topics Concern  . Not on file  Social History Narrative   Lives in [place], [place].   Single, not dating.    Has one child, age 43, 34.   Has family in [place], [place].   Enjoys swimming.   Enjoys time with family.    Eats all foods.   Wears seatbelt.    Social Determinants of Health   Financial Resource Strain: Not on file  Food Insecurity: Not on file  Transportation Needs: Not on file  Physical Activity: Not on file  Stress: Not on file  Social Connections: Not on file  Intimate Partner Violence: Not on file   Family History  Problem Relation Age of Onset  . Hypertension Father   . Diabetes Father   . Anxiety disorder Mother   . Depression Mother   . Cancer Sister        throat   . Hypertension Paternal Grandmother   . Hypertension Maternal Grandmother   . Asthma Daughter   . Cancer Maternal Aunt        Breast cancer   Past Surgical History:  Procedure Laterality Date  . CESAREAN SECTION    . CHOLECYSTECTOMY    . LAPAROSCOPIC GASTRIC SLEEVE RESECTION N/A 02/05/2020   Procedure: LAPAROSCOPIC SLEEVE GASTRECTOMY;  Surgeon: Clovis Riley, MD;  Location: [facility];  Service: General;  Laterality: N/A;  . NASAL SEPTOPLASTY W/ TURBINOPLASTY N/A 07/27/2019   Procedure: NASAL SEPTOPLASTY WITH TURBINATE REDUCTION;  Surgeon: Leta Baptist, MD;  Location: [facility];  Service: ENT;  Laterality: N/A;  . UPPER GI ENDOSCOPY N/A 02/05/2020   Procedure: UPPER GI ENDOSCOPY;  Surgeon: Clovis Riley, MD;  [facility];  Service: General;  Laterality: N/A;  . WISDOM TOOTH EXTRACTION       Vanessa Kick, MD 10/28/20 [phone]

## 2020-10-29 LAB — METANEPHRINES, PLASMA
Metanephrine, Free: 12.6 pg/mL (ref 0.0–88.0)
Normetanephrine, Free: 65.2 pg/mL (ref 0.0–210.1)

## 2020-11-06 ENCOUNTER — Encounter: Payer: Self-pay | Admitting: Adult Health

## 2020-11-06 ENCOUNTER — Ambulatory Visit (INDEPENDENT_AMBULATORY_CARE_PROVIDER_SITE_OTHER): Payer: Medicaid Other | Admitting: Adult Health

## 2020-11-06 ENCOUNTER — Other Ambulatory Visit: Payer: Self-pay

## 2020-11-06 VITALS — BP 120/84 | HR 75 | Ht 64.0 in | Wt 200.6 lb

## 2020-11-06 DIAGNOSIS — Z30011 Encounter for initial prescription of contraceptive pills: Secondary | ICD-10-CM | POA: Insufficient documentation

## 2020-11-06 DIAGNOSIS — F419 Anxiety disorder, unspecified: Secondary | ICD-10-CM

## 2020-11-06 DIAGNOSIS — M545 Low back pain, unspecified: Secondary | ICD-10-CM | POA: Diagnosis not present

## 2020-11-06 DIAGNOSIS — F32A Depression, unspecified: Secondary | ICD-10-CM

## 2020-11-06 DIAGNOSIS — Z1231 Encounter for screening mammogram for malignant neoplasm of breast: Secondary | ICD-10-CM | POA: Insufficient documentation

## 2020-11-06 LAB — POCT URINALYSIS DIPSTICK
Blood, UA: NEGATIVE
Glucose, UA: NEGATIVE
Leukocytes, UA: NEGATIVE
Nitrite, UA: NEGATIVE
Protein, UA: NEGATIVE

## 2020-11-06 LAB — POCT URINE PREGNANCY: Preg Test, Ur: NEGATIVE

## 2020-11-06 MED ORDER — FLUOXETINE HCL 10 MG PO CAPS
10.0000 mg | ORAL_CAPSULE | Freq: Every day | ORAL | 3 refills | Status: DC
Start: 2020-11-06 — End: 2020-12-10

## 2020-11-06 MED ORDER — SLYND 4 MG PO TABS: ORAL_TABLET | ORAL | 0 refills | Status: DC

## 2020-11-06 NOTE — Progress Notes (Signed)
Subjective:     Patient ID: Paula King, female   DOB: 29-Nov-1980, 40 y.o.   MRN: [id number]  HPI Paula King is a 40 year old white female,single, [phone], in to discuss getting on meds for depression, she had miscarriage 2 years ago, and feels guilty and that she has not over it, has appointment in June with counselor in [place]. She is also thinking she wants to get back on birth control, she has used Micronor in the past, due to PE after COVID,but it made her feel pregnant and have cravings.wakes up a lot at night, too. Has some low back pain too at times. Had normal pap with negative HPV, with Dr Buelah Manis 09/21/19.  Review of Systems +depression Not sleeping well ,wakes up Low back pain at times    Reviewed past medical,surgical, social and family history. Reviewed medications and allergies.  Objective:   Physical Exam BP 120/84 (BP Location: Right Arm, Patient Position: Sitting, Cuff Size: Normal)   Pulse 75   Ht 5\' 4"  (1.626 m)   Wt 200 lb 9.6 oz (91 kg)   LMP 10/28/2020 (Exact Date)   BMI 34.43 kg/m  UPT is negative. Urine dipstick was negative. Skin warm and dry.  Lungs: clear to ausculation bilaterally. Cardiovascular: regular rate and rhythm. Depression screen [facility] 2/9 11/06/2020 01/11/2020 12/21/2019  Decreased Interest 2 0 0  Down, Depressed, Hopeless 2 0 1  PHQ - 2 Score 4 0 1  Altered sleeping 2 0 0  Tired, decreased energy 2 0 0  Change in appetite 2 0 0  Feeling bad or failure about yourself  0 0 0  Trouble concentrating 2 0 0  Moving slowly or fidgety/restless 0 0 0  Suicidal thoughts 0 0 0  PHQ-9 Score 12 0 1  Difficult doing work/chores - Not difficult at all Somewhat difficult  Some recent data might be hidden   GAD 7 : Generalized Anxiety Score 11/06/2020 12/21/2019 05/24/2018  Nervous, Anxious, on Edge 1 0 0  Control/stop worrying 0 0 0  Worry too much - different things 0 0 0  Trouble relaxing 2 0 0  Restless 0 0 0  Easily annoyed or irritable 2 1 0   Afraid - awful might happen 0 0 0  Total GAD 7 Score 5 1 0  Anxiety Difficulty - Not difficult at all Not difficult at all    Upstream - 11/06/20 0906      Pregnancy Intention Screening   Does the patient want to become pregnant in the next year? No    Does the patient's partner want to become pregnant in the next year? No    Would the patient like to discuss contraceptive options today? Yes      Contraception Wrap Up   Current Method Withdrawal or Other Method    End Method Oral Contraceptive    Contraception Counseling Provided Yes             Assessment:     1. Anxiety and depression Discussed the miscarriage not her fault, it happens sometimes in early pregnancy Will Rx Prozac  Meds ordered this encounter  Medications  . FLUoxetine (PROZAC) 10 MG capsule    Sig: Take 1 capsule (10 mg total) by mouth daily.    Dispense:  30 capsule    Refill:  3    Order Specific Question:   Supervising Provider    Answer:   Florian Buff [[id number]]  See counselor as scheduled   2.  Encounter for initial prescription of contraceptive pills I gave 3 packs of Slynd to start today and use condoms for 1 pack  3. Screening mammogram for breast cancer Call for mammogram appt.  4. Low back pain    Plan:     Follow up in 10 weeks for physical and ROS

## 2020-12-09 LAB — CREATININE, URINE, 24 HOUR
Creatinine, 24H Ur: 1195 mg/24 hr (ref 800–1800)
Creatinine, Urine: 108.6 mg/dL

## 2020-12-10 ENCOUNTER — Encounter: Payer: Self-pay | Admitting: "Endocrinology

## 2020-12-10 ENCOUNTER — Telehealth: Payer: Self-pay

## 2020-12-10 ENCOUNTER — Ambulatory Visit (INDEPENDENT_AMBULATORY_CARE_PROVIDER_SITE_OTHER): Payer: Medicaid Other | Admitting: "Endocrinology

## 2020-12-10 ENCOUNTER — Other Ambulatory Visit: Payer: Self-pay

## 2020-12-10 VITALS — BP 110/58 | HR 72 | Ht 64.0 in | Wt 199.6 lb

## 2020-12-10 DIAGNOSIS — E049 Nontoxic goiter, unspecified: Secondary | ICD-10-CM

## 2020-12-10 DIAGNOSIS — D3501 Benign neoplasm of right adrenal gland: Secondary | ICD-10-CM

## 2020-12-10 NOTE — Progress Notes (Signed)
12/10/2020, 6:01 PM  Endocrinology follow-up note    Subjective:    Patient ID: Paula King, female    DOB: 02/04/1981, PCP Pcp, No   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[place])    Diverticulitis    GERD (gastroesophageal reflux disease)    Hypertension    Obese    Pneumonia    Preeclampsia    Pulmonary embolism ([place])    Past Surgical History:  Procedure Laterality Date   CESAREAN SECTION     CHOLECYSTECTOMY     LAPAROSCOPIC GASTRIC SLEEVE RESECTION N/A 02/05/2020   Procedure: LAPAROSCOPIC SLEEVE GASTRECTOMY;  Surgeon: Clovis Riley, MD;  Location: [facility];  Service: General;  Laterality: N/A;   NASAL SEPTOPLASTY W/ TURBINOPLASTY N/A 07/27/2019   Procedure: NASAL SEPTOPLASTY WITH TURBINATE REDUCTION;  Surgeon: Leta Baptist, MD;  Location: [facility];  Service: ENT;  Laterality: N/A;   UPPER GI ENDOSCOPY N/A 02/05/2020   Procedure: UPPER GI ENDOSCOPY;  Surgeon: Clovis Riley, MD;  Location: [facility];  Service: General;  Laterality: N/A;   WISDOM TOOTH EXTRACTION     Social History   Socioeconomic History   Marital status: Single    Spouse name: Not on file   Number of children: 1   Years of education: Not on file   Highest education level: Some college, no degree  Occupational History    Employer: [employer]  Tobacco Use   Smoking status: Never   Smokeless tobacco: Never  Vaping Use   Vaping Use: Never used  Substance and Sexual Activity   Alcohol use: No   Drug use: No   Sexual activity: Yes    Birth control/protection: None, Coitus interruptus  Other Topics Concern   Not on file  Social History Narrative   Lives in [place], [place].   Single, not dating.    Has one child, age 65, 89.   Has family in [place], [place].   Enjoys swimming.   Enjoys time with family.    Eats all foods.   Wears seatbelt.    Social Determinants of  Health   Financial Resource Strain: Not on file  Food Insecurity: Not on file  Transportation Needs: Not on file  Physical Activity: Not on file  Stress: Not on file  Social Connections: Not on file   Outpatient Encounter Medications as of 12/10/2020  Medication Sig   Calcium Carb-Cholecalciferol (CALCIUM 600+D3 PO) Take 1 tablet by mouth 3 (three) times daily.   cholecalciferol (VITAMIN D) 1000 units tablet Take 1,000 Units by mouth.    Cyanocobalamin (B-12) 1000 MCG TABS Take 1,000 mcg by mouth daily.    Multiple Vitamins-Minerals (BARIATRIC MULTIVITAMINS/IRON PO) Take 1 tablet by mouth daily.   [DISCONTINUED] cyclobenzaprine (FLEXERIL) 10 MG tablet Take 1 tablet by mouth 3 times daily as needed for muscle spasm. Warning: May cause drowsiness.   [DISCONTINUED] Drospirenone (SLYND) 4 MG TABS Take 1 daily   [DISCONTINUED] FLUoxetine (PROZAC) 10 MG capsule Take 1 capsule (10 mg total) by mouth daily.   No facility-administered encounter medications on file as of 12/10/2020.   ALLERGIES: Allergies  Allergen Reactions   Ciprofloxacin  IV Only-Patient started sweating and becoming clammy when was infused with IV cipro on 08/09/14. Can tolerate PO Ciprofloxacin     VACCINATION STATUS: Immunization History  Administered Date(s) Administered   Influenza,inj,Quad PF,6+ Mos 04/26/2019   Influenza-Unspecified 04/03/2020    HPI Paula King is 40 y.o. female who presents today to follow-up for stable right adrenal adenoma, goiter.  She underwent sleeve gastrectomy for weight management in August 2021.  She presents with more weight loss, total of 66 pounds of weight loss since her surgery.  She feels better has no new complaints today.    - Her history starts in February 2016 when she underwent CT scan of her abdomen due to left lower quadrant pain which incidentally showed 2.7 cm mass  on her right adrenal gland which was reported to be consistent with adenoma. - Repeat CT abdomen  in November 2018 revealed that the adenoma persisted and measured 3 cm ( HU 0) still favoring adenoma. -More recently on March 24, 2019, she underwent CT angiogram related to work-up in the hospital when she was admitted for COVID-19, the right adrenal adenoma was found to be measuring 3.2cm.  Her previsit 24-hour urine free cortisol is still pending. -Previously ,she underwent hormonal work-up to determine functional status of this adenoma.  Findings have not been conclusive, except for one instance of 24-hour urine free cortisol marginally elevated at 59. -Her subsequent functional studies were within normal limits, did not require definitive intervention and was put on observation with plan to repeat plasma free metanephrines, A1c, and thyroid function tests.  These are all within normal limits.  -Before this visit, she underwent plasma free metanephrines which were normal.     -After her recent laparoscopic sleeve gastrectomy, she was taken off of her blood pressure medications.  Her blood pressure remains under control at 110/58. - She has 1 child, 12 years of age. - She denies diaphoresis, uncontrolled hypertension, headaches.  Review of Systems  Limited as above.  Objective:    BP (!) 110/58   Pulse 72   Ht 5\' 4"  (1.626 m)   Wt 199 lb 9.6 oz (90.5 kg)   BMI 34.26 kg/m   Wt Readings from Last 3 Encounters:  12/10/20 199 lb 9.6 oz (90.5 kg)  11/06/20 200 lb 9.6 oz (91 kg)  10/28/20 200 lb (90.7 kg)    Physical Exam   CMP ( most recent) CMP     Component Value Date/Time   NA 138 02/07/2020 0457   K 3.4 (L) 02/07/2020 0457   CL 106 02/07/2020 0457   CO2 24 02/07/2020 0457   GLUCOSE 98 02/07/2020 0457   BUN 10 02/07/2020 0457   CREATININE 0.93 02/07/2020 0457   CREATININE 0.98 07/18/2019 1505   CALCIUM 8.6 (L) 02/07/2020 0457   PROT 7.1 02/06/2020 0440   ALBUMIN 3.9 02/06/2020 0440   AST 31 02/06/2020 0440   ALT 44 02/06/2020 0440   ALKPHOS 55 02/06/2020 0440    BILITOT 0.8 02/06/2020 0440   GFRNONAA >60 02/07/2020 0457   GFRNONAA 64 04/18/2017 1041   GFRAA >60 02/07/2020 0457   GFRAA 74 04/18/2017 1041     Diabetic Labs (most recent): Lab Results  Component Value Date   HGBA1C 5.4 07/18/2019   HGBA1C 5.9 (H) 02/16/2019   HGBA1C 5.6 12/13/2018     Lipid Panel ( most recent) Lipid Panel     Component Value Date/Time   CHOL 147 05/24/2018 1133   TRIG 134 03/06/2019 2132  HDL 43 (L) 05/24/2018 1133   CHOLHDL 3.4 05/24/2018 1133   LDLCALC 84 05/24/2018 1133       Lab Results  Component Value Date   TSH 2.620 12/13/2018   TSH 1.83 04/18/2017   FREET4 1.06 12/13/2018      Most recent 24-hour urine free cortisol on November 28, 2017 was 14 improving from 49.  05/10/2017   CT abdomen  Radiology description of her right adrenal mass: Adrenal: Previously noted right adrenal mass is similar to the prior examination measuring 3.0 x 2.6 cm and is diffusely low-attenuation (0 HU), compatible with an adenoma. Left adrenal gland and bilateral kidneys are normal in appearance. No hydroureteronephrosis in the visualized portions of the abdomen.   March 24, 2019, 3.2 cm right adrenal adenoma redemonstrated.   Assessment & Plan:   1. Adrenal adenoma, right  -3.2cm slowly growing stable adenoma.  Radiologic features favoring benign adenoma. - a series of functional studies have been normal-unremarkable.  Patient has difficulty losing weight, see below.  Her most recent overnight dexamethasone suppression test did not exclude Cushing syndrome.  She did have normal plasma metanephrines in 2019.   - her previsit plasma metanephrines are normal.  Her 24-hour urine free cortisol is pending.  She will be contacted if abnormal.   2) morbid obesity -She is status post laparoscopic sleeve gastrectomy achieving 66 pounds of weight loss. This is a great improvement for her.  - Suggestion is made for her to avoid simple carbohydrates  from  her diet including Cakes, Sweet Desserts, Ice Cream, Soda (diet and regular), Sweet Tea, Candies, Chips, Cookies, Store Bought Juices, Alcohol in Excess of  1-2 drinks a day, Artificial Sweeteners,  Coffee Creamer, and "Sugar-free" Products, Lemonade. This will help patient to have more stable blood glucose profile and potentially avoid unintended weight gain.    3. Goiter - She does not have family history of thyroid cancer.  She has significant thyromegaly with no discrete nodules, euthyroid based on her thyroid function tests, no intervention at this time.  She will have repeat thyroid ultrasound and thyroid function test before her next visit.   - I advised patient to maintain close follow up with Pcp, No for primary care needs.    I spent 32 minutes in the care of the patient today including review of labs from Thyroid Function, CMP, and other relevant labs ; imaging/biopsy records (current and previous including abstractions from other facilities); face-to-face time discussing  her lab results and symptoms, medications doses, her options of short and long term treatment based on the latest standards of care / guidelines;   and documenting the encounter.  Meta Hatchet Ambrosius  participated in the discussions, expressed understanding, and voiced agreement with the above plans.  All questions were answered to her satisfaction. she is encouraged to contact clinic should she have any questions or concerns prior to her return visit.    Follow up plan: Return in about 1 year (around 12/10/2021), or Ct Adrenal in a year, for F/U with Pre-visit Labs, Thyroid / Neck Ultrasound.   Glade Lloyd, MD [facility] [address] [place], [place] [address] Phone: [phone]  Fax: [phone]     12/10/2020, 6:01 PM  This note was partially dictated with voice recognition software. Similar sounding words can be transcribed inadequately or may not  be  corrected upon review.

## 2020-12-10 NOTE — Telephone Encounter (Signed)
Patient came in for visit. As I verified her insurance, she said she had [employer]. I advised pt we do not accept or file this insurance. I advised her once again, we do not file this. I told her she could still be seen but would be considered self pay.

## 2020-12-11 LAB — CORTISOL, URINE, FREE
Cortisol (Ur), Free: 31 ug/24 hr (ref 6–42)
Cortisol,F,ug/L,U: 28 ug/L

## 2020-12-12 ENCOUNTER — Other Ambulatory Visit: Payer: Self-pay | Admitting: Adult Health

## 2020-12-12 MED ORDER — SERTRALINE HCL 50 MG PO TABS: 50.0000 mg | ORAL_TABLET | Freq: Every day | ORAL | 3 refills | Status: DC

## 2020-12-12 NOTE — Progress Notes (Signed)
Will rx zoloft

## 2020-12-15 ENCOUNTER — Ambulatory Visit (HOSPITAL_COMMUNITY): Payer: Medicaid Other

## 2020-12-15 ENCOUNTER — Encounter (HOSPITAL_COMMUNITY): Payer: Self-pay

## 2020-12-23 ENCOUNTER — Other Ambulatory Visit: Payer: Self-pay | Admitting: Adult Health

## 2020-12-23 DIAGNOSIS — Z319 Encounter for procreative management, unspecified: Secondary | ICD-10-CM

## 2020-12-23 NOTE — Progress Notes (Signed)
Ck QHCG  

## 2020-12-24 LAB — BETA HCG QUANT (REF LAB): hCG Quant: 1 m[IU]/mL

## 2021-01-12 ENCOUNTER — Telehealth: Payer: Self-pay | Admitting: Adult Health

## 2021-01-12 DIAGNOSIS — Z319 Encounter for procreative management, unspecified: Secondary | ICD-10-CM

## 2021-01-12 NOTE — Telephone Encounter (Signed)
Pt would like her Hcg levels rechecked  Please advise & call pt

## 2021-01-13 ENCOUNTER — Other Ambulatory Visit: Payer: Self-pay | Admitting: Adult Health

## 2021-01-14 LAB — BETA HCG QUANT (REF LAB): hCG Quant: <1 m[IU]/mL

## 2021-01-15 ENCOUNTER — Other Ambulatory Visit: Payer: Medicaid Other | Admitting: Adult Health

## 2021-01-27 ENCOUNTER — Ambulatory Visit: Payer: Medicaid Other | Admitting: Adult Health

## 2021-02-10 ENCOUNTER — Encounter: Payer: Self-pay | Admitting: Obstetrics & Gynecology

## 2021-02-10 ENCOUNTER — Ambulatory Visit (INDEPENDENT_AMBULATORY_CARE_PROVIDER_SITE_OTHER): Payer: Medicaid Other | Admitting: Obstetrics & Gynecology

## 2021-02-10 ENCOUNTER — Other Ambulatory Visit: Payer: Self-pay

## 2021-02-10 VITALS — BP 123/83 | HR 72 | Ht 64.0 in | Wt 190.6 lb

## 2021-02-10 DIAGNOSIS — Z3201 Encounter for pregnancy test, result positive: Secondary | ICD-10-CM

## 2021-02-10 DIAGNOSIS — Z3490 Encounter for supervision of normal pregnancy, unspecified, unspecified trimester: Secondary | ICD-10-CM | POA: Diagnosis not present

## 2021-02-10 DIAGNOSIS — O09521 Supervision of elderly multigravida, first trimester: Secondary | ICD-10-CM | POA: Diagnosis not present

## 2021-02-10 LAB — POCT URINE PREGNANCY: Preg Test, Ur: POSITIVE — AB

## 2021-02-10 NOTE — Patient Instructions (Signed)
Please schedule a mammogram at one of the following locations:  [place]: [phone]  [facility] [place]:[phone] [address]  

## 2021-02-10 NOTE — Progress Notes (Signed)
   GYN VISIT Patient name: Paula King MRN [id number]  Date of birth: 06/16/1981 Chief Complaint:   Possible Pregnancy (+ pregnancy test)  History of Present Illness:   Paula King is a 40 y.o. [id number] female being seen today for early pregnancy confirmation.   Pt had noted some mild left-sided pelvic pain, no bleeding.  Desired pregnancy.  History reviewed: [place]: 2012- 35wks- IOL for preeclampsia with severe features ([facility] in [place]), arrest of dilation, primary C-section SAB x 2-6wks, 9wks, TAB x 1  -PSH: gastric sleeve Aug 2021, cholecystectomy (>64yr) -PMHx:Cushing's  Meds: xyzal, cymbalta,    Patient's last menstrual period was 01/20/2021.  [facility] 02/10/2021 11/06/2020 01/11/2020 12/21/2019 10/22/2019  Decreased Interest 0 2 0 0 0  Down, Depressed, Hopeless 1 2 0 1 0  PHQ - 2 Score 1 4 0 1 0  Altered sleeping 1 2 0 0 -  Tired, decreased energy 1 2 0 0 -  Change in appetite 0 2 0 0 -  Feeling bad or failure about yourself  0 0 0 0 -  Trouble concentrating 0 2 0 0 -  Moving slowly or fidgety/restless 0 0 0 0 -  Suicidal thoughts 0 0 0 0 -  PHQ-9 Score 3 12 0 1 -  Difficult doing work/chores - - Not difficult at all Somewhat difficult -  Some recent data might be hidden     Review of Systems:   Pertinent items are noted in HPI Denies fever/chills, dizziness, headaches, visual disturbances, fatigue, shortness of breath, chest pain, abdominal pain, vomiting, no problems with periods, bowel movements, urination, or intercourse unless otherwise stated above.  Pertinent History Reviewed:  Reviewed past medical,surgical, social, obstetrical and family history.  Reviewed problem list, medications and allergies. Physical Assessment:   Vitals:   02/10/21 0835  BP: 123/83  Pulse: 72  Weight: 190 lb 9.6 oz (86.5 kg)  Height: '5\' 4"'$  (1.626 m)  Body mass index is 32.72 kg/m.       Physical Examination:   General appearance: alert, well appearing,  and in no distress  Psych: mood appropriate, normal affect  Skin: warm & dry   Cardiovascular: normal heart rate noted  Respiratory: normal respiratory effort, no distress  Abdomen: soft, non-tender   Pelvic: n/a  Extremities: no edema   Chaperone: N/A    Assessment & Plan:  1) Early pregnancy -reviewed importance of "healthy you" discussed hydration, exercise and nutrition -plan for early [place] genetic testing -due to h/o preeclampsia, '[]'$  ASA daily -h/o Cushing's- no meds currently, advised notifying endo of pregnancy once confirmed by [place]-Ok to continue with current medications -f/u in 3-4wks for initial OB visit  Orders Placed This Encounter  Procedures   POCT urine pregnancy    Return in about 2 weeks (around 02/24/2021) for next available initial OB visit and [place]   JJanyth Pupa DO Attending [place] [facility] [place]

## 2021-02-17 ENCOUNTER — Other Ambulatory Visit: Payer: Self-pay

## 2021-02-17 DIAGNOSIS — Z8759 Personal history of other complications of pregnancy, childbirth and the puerperium: Secondary | ICD-10-CM

## 2021-02-18 ENCOUNTER — Other Ambulatory Visit: Payer: Self-pay | Admitting: Adult Health

## 2021-02-18 LAB — SPECIMEN STATUS REPORT

## 2021-02-18 LAB — BETA HCG QUANT (REF LAB): hCG Quant: <1 m[IU]/mL

## 2021-02-18 MED ORDER — NORETHINDRONE 0.35 MG PO TABS: 1.0000 | ORAL_TABLET | Freq: Every day | ORAL | 11 refills | Status: DC

## 2021-02-18 NOTE — Progress Notes (Signed)
Rx Micronor at pt request

## 2021-02-26 ENCOUNTER — Other Ambulatory Visit: Payer: Medicaid Other

## 2021-03-03 ENCOUNTER — Other Ambulatory Visit: Payer: Medicaid Other

## 2021-03-18 ENCOUNTER — Other Ambulatory Visit (INDEPENDENT_AMBULATORY_CARE_PROVIDER_SITE_OTHER): Payer: Medicaid Other

## 2021-03-18 ENCOUNTER — Other Ambulatory Visit: Payer: Self-pay

## 2021-03-18 VITALS — BP 120/80 | HR 91 | Ht 64.0 in | Wt 198.0 lb

## 2021-03-18 DIAGNOSIS — Z3201 Encounter for pregnancy test, result positive: Secondary | ICD-10-CM

## 2021-03-18 LAB — POCT URINE PREGNANCY: Preg Test, Ur: POSITIVE — AB

## 2021-03-18 NOTE — Progress Notes (Signed)
   NURSE VISIT- PREGNANCY CONFIRMATION   SUBJECTIVE:  Paula King is a 40 y.o. [phone] female at [redacted]w[redacted]d by certain LMP of Patient's last menstrual period was 02/17/2021 (exact date). Here for pregnancy confirmation.  Home pregnancy test: positive x 5   She reports no complaints.  She is not taking prenatal vitamins.    OBJECTIVE:  BP 120/80 (BP Location: Right Arm, Patient Position: Sitting, Cuff Size: Normal)   Pulse 91   Ht 5\' 4"  (1.626 m)   Wt 198 lb (89.8 kg)   LMP 02/17/2021 (Exact Date)   BMI 33.99 kg/m   Appears well, in no apparent distress  Results for orders placed or performed in visit on 03/18/21 (from the past 24 hour(s))  POCT urine pregnancy   Collection Time: 03/18/21  2:45 PM  Result Value Ref Range   Preg Test, Ur Positive (A) Negative    ASSESSMENT: Positive pregnancy test, [redacted]w[redacted]d by LMP    PLAN: Schedule for dating ultrasound in 3 weeks Prenatal vitamins:  Instructed pt to take extra folic acid    Nausea medicines: not currently needed   OB packet given: Yes  Riana Tessmer A Stevee Valenta  03/18/2021 3:00 PM

## 2021-03-18 NOTE — Progress Notes (Signed)
Chart reviewed for nurse visit. Agree with plan of care.  Estill Dooms, NP 03/18/2021 5:57 PM

## 2021-03-19 LAB — BETA HCG QUANT (REF LAB): hCG Quant: 249 m[IU]/mL

## 2021-03-27 ENCOUNTER — Other Ambulatory Visit (HOSPITAL_BASED_OUTPATIENT_CLINIC_OR_DEPARTMENT_OTHER): Payer: Self-pay

## 2021-04-07 ENCOUNTER — Other Ambulatory Visit: Payer: Self-pay | Admitting: Obstetrics & Gynecology

## 2021-04-07 DIAGNOSIS — O3680X0 Pregnancy with inconclusive fetal viability, not applicable or unspecified: Secondary | ICD-10-CM

## 2021-04-08 ENCOUNTER — Other Ambulatory Visit: Payer: Self-pay | Admitting: *Deleted

## 2021-04-08 ENCOUNTER — Ambulatory Visit (INDEPENDENT_AMBULATORY_CARE_PROVIDER_SITE_OTHER): Payer: Medicaid Other

## 2021-04-08 ENCOUNTER — Other Ambulatory Visit: Payer: Self-pay

## 2021-04-08 DIAGNOSIS — Z3A01 Less than 8 weeks gestation of pregnancy: Secondary | ICD-10-CM | POA: Diagnosis not present

## 2021-04-08 DIAGNOSIS — O3680X0 Pregnancy with inconclusive fetal viability, not applicable or unspecified: Secondary | ICD-10-CM

## 2021-04-08 NOTE — Progress Notes (Signed)
[name] 7+1 wks,single IUP with YS,FHR 140 bpm,crl 9.67 mm,normal ovaries

## 2021-04-13 ENCOUNTER — Other Ambulatory Visit: Payer: Medicaid Other | Admitting: Obstetrics & Gynecology

## 2021-05-11 ENCOUNTER — Other Ambulatory Visit: Payer: Self-pay | Admitting: Obstetrics & Gynecology

## 2021-05-11 DIAGNOSIS — O0990 Supervision of high risk pregnancy, unspecified, unspecified trimester: Secondary | ICD-10-CM | POA: Insufficient documentation

## 2021-05-11 DIAGNOSIS — Z3682 Encounter for antenatal screening for nuchal translucency: Secondary | ICD-10-CM

## 2021-05-11 DIAGNOSIS — Z98891 History of uterine scar from previous surgery: Secondary | ICD-10-CM | POA: Insufficient documentation

## 2021-05-12 ENCOUNTER — Encounter: Payer: Medicaid Other | Admitting: Women's Health

## 2021-05-12 ENCOUNTER — Other Ambulatory Visit: Payer: Self-pay | Admitting: Obstetrics & Gynecology

## 2021-05-12 ENCOUNTER — Ambulatory Visit: Payer: Medicaid Other | Admitting: *Deleted

## 2021-05-12 ENCOUNTER — Encounter: Payer: Self-pay | Admitting: Adult Health

## 2021-05-12 ENCOUNTER — Other Ambulatory Visit: Payer: Medicaid Other

## 2021-05-12 ENCOUNTER — Ambulatory Visit (INDEPENDENT_AMBULATORY_CARE_PROVIDER_SITE_OTHER): Payer: Medicaid Other

## 2021-05-12 ENCOUNTER — Ambulatory Visit (INDEPENDENT_AMBULATORY_CARE_PROVIDER_SITE_OTHER): Payer: Medicaid Other | Admitting: Adult Health

## 2021-05-12 ENCOUNTER — Other Ambulatory Visit: Payer: Self-pay

## 2021-05-12 ENCOUNTER — Telehealth: Payer: Self-pay | Admitting: Adult Health

## 2021-05-12 VITALS — BP 118/72 | HR 75 | Ht 64.0 in | Wt 204.4 lb

## 2021-05-12 DIAGNOSIS — O3680X0 Pregnancy with inconclusive fetal viability, not applicable or unspecified: Secondary | ICD-10-CM | POA: Diagnosis not present

## 2021-05-12 DIAGNOSIS — O021 Missed abortion: Secondary | ICD-10-CM | POA: Diagnosis not present

## 2021-05-12 DIAGNOSIS — Z98891 History of uterine scar from previous surgery: Secondary | ICD-10-CM

## 2021-05-12 DIAGNOSIS — Z3682 Encounter for antenatal screening for nuchal translucency: Secondary | ICD-10-CM

## 2021-05-12 DIAGNOSIS — O0991 Supervision of high risk pregnancy, unspecified, first trimester: Secondary | ICD-10-CM

## 2021-05-12 DIAGNOSIS — Z3A12 12 weeks gestation of pregnancy: Secondary | ICD-10-CM

## 2021-05-12 MED ORDER — MISOPROSTOL 200 MCG PO TABS: ORAL_TABLET | ORAL | 0 refills | Status: DC

## 2021-05-12 NOTE — Telephone Encounter (Signed)
Per Anderson Malta patient will need to be set up for an appointment with Dr. Elonda Husky or Nelda Marseille. Front desk to assist patient with appointment.

## 2021-05-12 NOTE — Progress Notes (Addendum)
  Subjective:     Patient ID: Paula King, female   DOB: 09-22-1980, 40 y.o.   MRN: [id number]  HPI Paula King is a 40 year old white female,single, in for [name] and found to not have a heart beat, should be about  40 weeks by LMP but measured 9 weeks. She says she has had several miscarriages.  Review of Systems Denies any bleeding Reviewed past medical,surgical, social and family history. Reviewed medications and allergies.     Objective:   Physical Exam BP 118/72 (BP Location: Left Arm, Patient Position: Sitting, Cuff Size: Normal)   Pulse 75   Ht 5\' 4"  (1.626 m)   Wt 204 lb 6.4 oz (92.7 kg)   LMP 02/17/2021 (Exact Date)   BMI 35.09 kg/m  Skin warm and dry.  Lungs: clear to ausculation bilaterally. Cardiovascular: regular rate and rhythm.    US showed GS 49.5 mm and CRL 23.06 mm No FHM Blood type is A+, in epic.   Upstream - 05/12/21 1506       Pregnancy Intention Screening   Does the patient want to become pregnant in the next year? N/A    Does the patient's partner want to become pregnant in the next year? N/A    Would the patient like to discuss contraceptive options today? N/A      Contraception Wrap Up   Current Method Pregnant/Seeking Pregnancy    End Method Pregnant/Seeking Pregnancy    Contraception Counseling Provided No             Assessment:     1. Missed abortion Discussed options, waiting, Cytotec or D&C and she will try Cytotec Meds ordered this encounter  Medications   misoprostol (CYTOTEC) 200 MCG tablet    Sig: Place 4 in vagina Friday night and repeat in 48 hours if needed    Dispense:  8 tablet    Refill:  0    Order Specific Question:   Supervising Provider    Answer:   Florian Buff [[id number]]   Will check QHCG today    Plan:     Follow up in 2 weeks

## 2021-05-12 NOTE — Progress Notes (Signed)
[name] 9 wks single IUP,no FHT visualized,CRL 23.06 mm,GS 49.5 mm,normal ovaries,Paula King discussed results with patient

## 2021-05-12 NOTE — Telephone Encounter (Signed)
Pt called, states she'd like to go ahead with a D & C  Please advise & notify pt

## 2021-05-13 ENCOUNTER — Encounter: Payer: Self-pay | Admitting: *Deleted

## 2021-05-13 LAB — BETA HCG QUANT (REF LAB): hCG Quant: 4508 m[IU]/mL

## 2021-05-14 ENCOUNTER — Encounter: Payer: Self-pay | Admitting: *Deleted

## 2021-05-15 ENCOUNTER — Ambulatory Visit: Payer: Medicaid Other | Admitting: Obstetrics & Gynecology

## 2021-05-25 ENCOUNTER — Encounter: Payer: Self-pay | Admitting: Adult Health

## 2021-05-25 ENCOUNTER — Other Ambulatory Visit: Payer: Self-pay

## 2021-05-25 ENCOUNTER — Ambulatory Visit (INDEPENDENT_AMBULATORY_CARE_PROVIDER_SITE_OTHER): Payer: Medicaid Other | Admitting: Adult Health

## 2021-05-25 VITALS — BP 127/74 | HR 72 | Ht 64.0 in | Wt 205.0 lb

## 2021-05-25 DIAGNOSIS — O039 Complete or unspecified spontaneous abortion without complication: Secondary | ICD-10-CM | POA: Insufficient documentation

## 2021-05-25 NOTE — Progress Notes (Addendum)
  Subjective:     Patient ID: Paula King, female   DOB: 01/15/1981, 40 y.o.   MRN: [id number]  HPI [name] is a 40 year old white female,single, [phone], sp miscarriage, she took Cytotec 11/16 and passed tissue and baby that night, had bad cramps. She would like to get pregnant again soon.  Blood type A+ She had gastric sleeve August 2021 and has lost 70 lbs.   Review of Systems Passed tissue after Cytotec Has felt cold.  Reviewed past medical,surgical, social and family history. Reviewed medications and allergies.     Objective:   Physical Exam BP 127/74 (BP Location: Left Arm, Patient Position: Sitting, Cuff Size: Large)   Pulse 72   Ht 5\' 4"  (1.626 m)   Wt 205 lb (93 kg)   Breastfeeding No   BMI 35.19 kg/m  Skin warm and dry.Lungs: clear to ausculation bilaterally. Cardiovascular: regular rate and rhythm.      Upstream - 05/25/21 1602       Pregnancy Intention Screening   Does the patient want to become pregnant in the next year? Yes    Does the patient's partner want to become pregnant in the next year? Yes    Would the patient like to discuss contraceptive options today? No      Contraception Wrap Up   Current Method Pregnant/Seeking Pregnancy    End Method Pregnant/Seeking Pregnancy    Contraception Counseling Provided No             Assessment:     1. Miscarriage Will check QHCG,CBC and TSH    Continue with PNV Wait to try for another pregnancy, till has period  Plan:     Call when +HPT, will check progesterone level early, and supplement if needed and add ASA 81 mg then too    Discussed with Dr Nelda Marseille

## 2021-05-26 ENCOUNTER — Other Ambulatory Visit: Payer: Self-pay | Admitting: Adult Health

## 2021-05-26 DIAGNOSIS — O039 Complete or unspecified spontaneous abortion without complication: Secondary | ICD-10-CM

## 2021-05-26 LAB — CBC
Hematocrit: 42.4 % (ref 34.0–46.6)
Hemoglobin: 14.2 g/dL (ref 11.1–15.9)
MCH: 31.5 pg (ref 26.6–33.0)
MCHC: 33.5 g/dL (ref 31.5–35.7)
MCV: 94 fL (ref 79–97)
Platelets: 323 10*3/uL (ref 150–450)
RBC: 4.51 x10E6/uL (ref 3.77–5.28)
RDW: 12.2 % (ref 11.7–15.4)
WBC: 9.9 10*3/uL (ref 3.4–10.8)

## 2021-05-26 LAB — BETA HCG QUANT (REF LAB): hCG Quant: 27 m[IU]/mL

## 2021-05-26 LAB — TSH: TSH: 1.35 u[IU]/mL (ref 0.450–4.500)

## 2021-06-01 ENCOUNTER — Ambulatory Visit: Payer: Medicaid Other | Admitting: *Deleted

## 2021-06-01 ENCOUNTER — Encounter: Payer: Medicaid Other | Admitting: Women's Health

## 2021-06-15 ENCOUNTER — Encounter: Payer: Self-pay | Admitting: *Deleted

## 2021-07-03 ENCOUNTER — Other Ambulatory Visit (HOSPITAL_COMMUNITY): Payer: Self-pay | Admitting: Family Medicine

## 2021-07-03 ENCOUNTER — Ambulatory Visit (HOSPITAL_COMMUNITY)
Admission: RE | Admit: 2021-07-03 | Discharge: 2021-07-03 | Disposition: A | Discharge status: Home / Self Care | Payer: Medicaid Other | Source: Ambulatory Visit | Attending: Family Medicine | Admitting: Family Medicine

## 2021-07-03 ENCOUNTER — Other Ambulatory Visit: Payer: Self-pay

## 2021-07-03 DIAGNOSIS — M5442 Lumbago with sciatica, left side: Secondary | ICD-10-CM | POA: Insufficient documentation

## 2021-07-03 IMAGING — DX DG LUMBAR SPINE 2-3V
3 series · 3 of 3 positions shown · non-contrast
Comparison: None.

CLINICAL DATA: Acute back pain with left-sided sciatica.

EXAM:
LUMBAR SPINE - 2-3 VIEW

[l-spine ap]
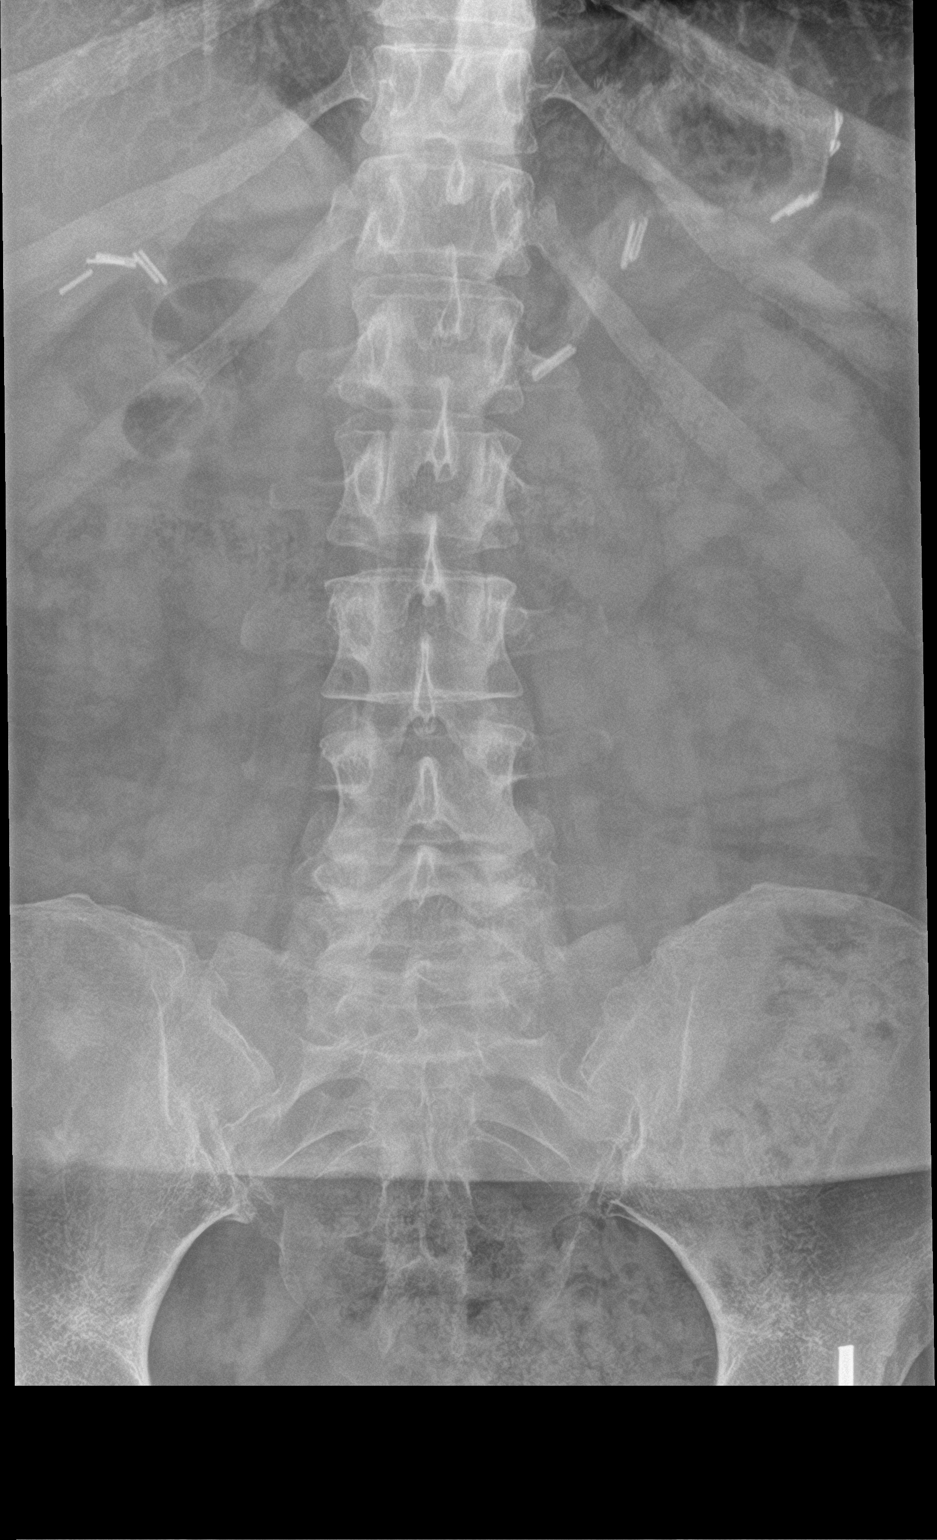

[l-spine lat]
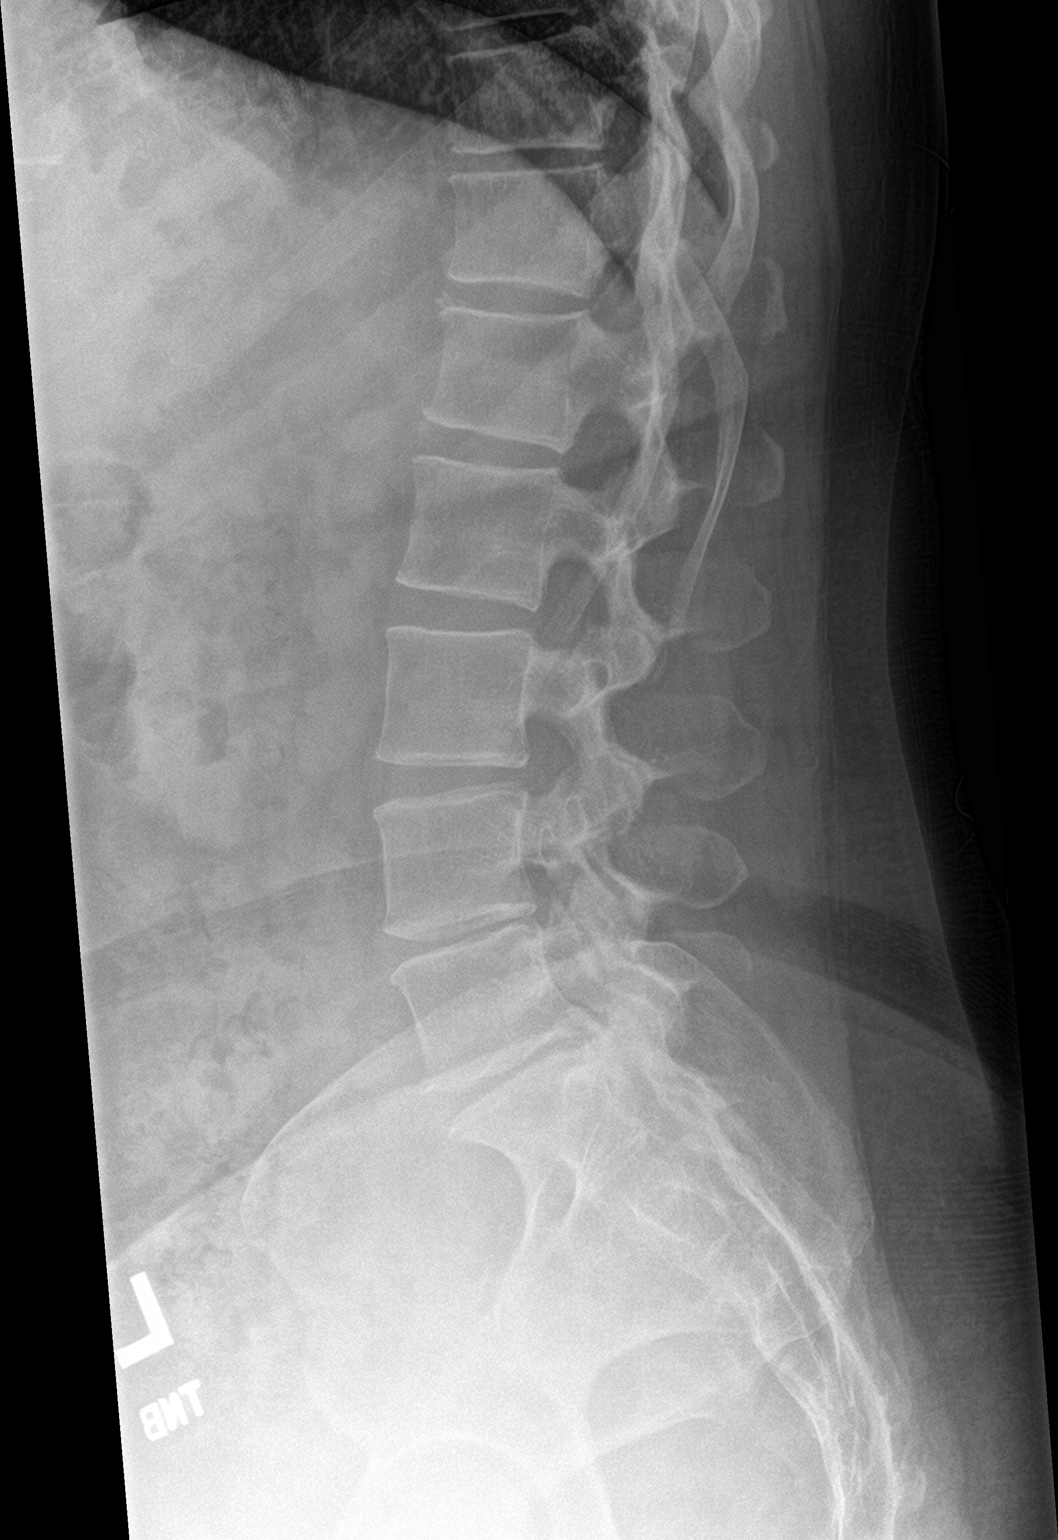

[l-spine spot]
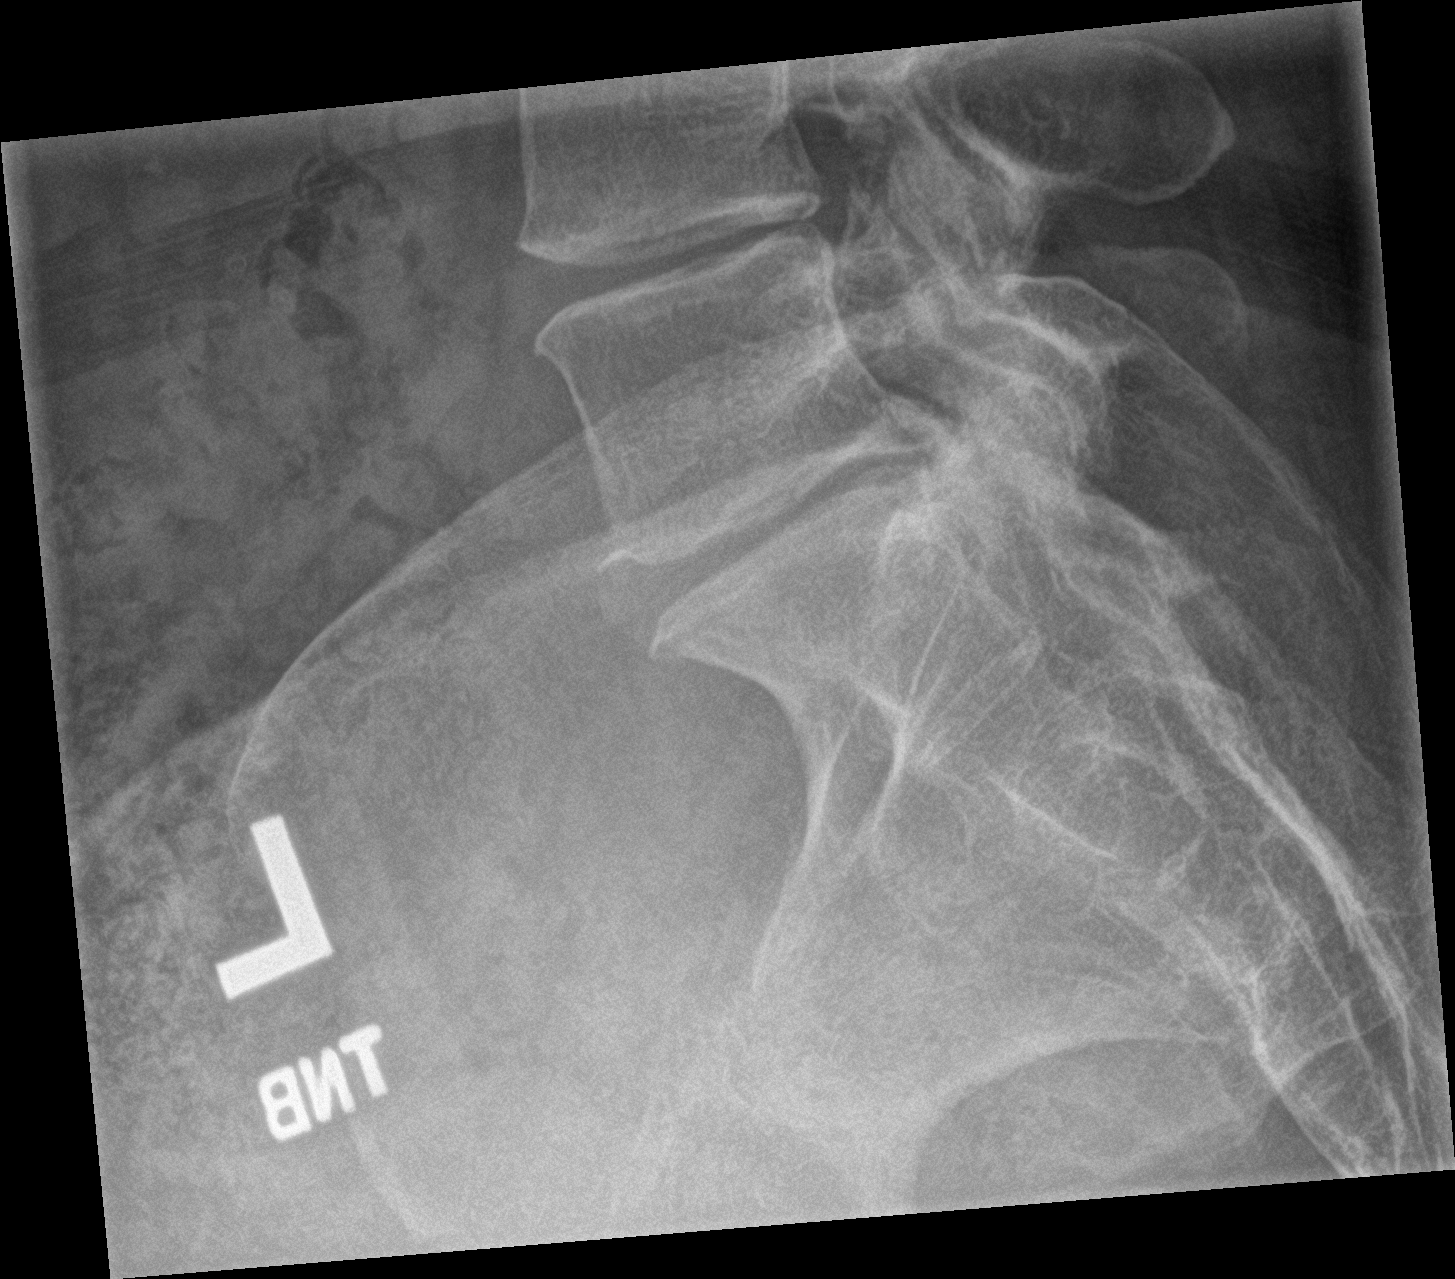

[3 of 3 positions shown; findings below may reference images not displayed]

FINDINGS: There are 5 non rib-bearing lumbar type vertebral bodies

Mild straightening expected lumbar lordosis. No anterolisthesis or
retrolisthesis. No significant scoliotic curvature.

Lumbar vertebral body heights appear preserved.

Moderate to severe DDD of L5-S1 and to a lesser extent, L4-L5 with
disc space height loss, endplate irregularity and sclerosis.

Limited visualization of the bilateral SI joints is normal.

Phleboliths overlie the lower pelvis bilaterally. Surgical clips
overlie the upper abdomen. Large colonic stool burden without
evidence of enteric obstruction.
IMPRESSION: Moderate to severe DDD of L5-S1 and to a lesser extent, L4-L5.

## 2021-07-29 ENCOUNTER — Telehealth: Payer: Self-pay | Admitting: "Endocrinology

## 2021-07-29 NOTE — Telephone Encounter (Signed)
Spoke with Jeannene Patella and she will be back in the office on Friday to contact patient/ tried to call patient to inform her of this information and she did not answer.

## 2021-07-29 NOTE — Telephone Encounter (Signed)
Contacted patient because she is scheduled for June 19th and Dr Dorris Fetch will not be here that day. Reminded patient as well we do not accept her insurance so we will not be able to file it as it is [employer]. Pt states she had this issue with [name] last time and we do accept it and will be filing it. Informed patient it is on our list that we not accept therefore we could not file it if its not accepted. She is requesting to speak with Glass blower/designer. Her number is [phone].

## 2021-08-11 NOTE — Telephone Encounter (Signed)
Attempted to contact pt to schedule her follow up. No answer.

## 2021-08-27 ENCOUNTER — Encounter (HOSPITAL_COMMUNITY): Payer: Self-pay | Admitting: *Deleted

## 2021-09-04 ENCOUNTER — Ambulatory Visit (HOSPITAL_COMMUNITY): Payer: Medicaid Other

## 2021-09-04 ENCOUNTER — Encounter: Payer: Self-pay | Admitting: Adult Health

## 2021-09-04 ENCOUNTER — Encounter (HOSPITAL_COMMUNITY): Payer: Self-pay

## 2021-09-04 ENCOUNTER — Ambulatory Visit (HOSPITAL_COMMUNITY)
Admission: RE | Admit: 2021-09-04 | Discharge: 2021-09-04 | Disposition: A | Discharge status: Home / Self Care | Payer: Medicaid Other | Source: Ambulatory Visit | Attending: Adult Health | Admitting: Adult Health

## 2021-09-04 ENCOUNTER — Other Ambulatory Visit: Payer: Self-pay

## 2021-09-04 ENCOUNTER — Ambulatory Visit (INDEPENDENT_AMBULATORY_CARE_PROVIDER_SITE_OTHER): Payer: Medicaid Other | Admitting: Adult Health

## 2021-09-04 VITALS — BP 103/69 | HR 72 | Ht 64.0 in | Wt 211.0 lb

## 2021-09-04 DIAGNOSIS — N6324 Unspecified lump in the left breast, lower inner quadrant: Secondary | ICD-10-CM | POA: Diagnosis not present

## 2021-09-04 DIAGNOSIS — Z30430 Encounter for insertion of intrauterine contraceptive device: Secondary | ICD-10-CM | POA: Diagnosis not present

## 2021-09-04 DIAGNOSIS — Z1231 Encounter for screening mammogram for malignant neoplasm of breast: Secondary | ICD-10-CM | POA: Insufficient documentation

## 2021-09-04 DIAGNOSIS — Z3202 Encounter for pregnancy test, result negative: Secondary | ICD-10-CM

## 2021-09-04 LAB — POCT URINE PREGNANCY: Preg Test, Ur: NEGATIVE

## 2021-09-04 MED ORDER — PARAGARD INTRAUTERINE COPPER IU IUD
1.0000 | INTRAUTERINE_SYSTEM | Freq: Once | INTRAUTERINE | Status: AC
Start: 2021-09-04 — End: 2021-09-04
  Administered 2021-09-04: 1 via INTRAUTERINE

## 2021-09-04 NOTE — Progress Notes (Signed)
Patient ID: Paula King, female   DOB: May 17, 1981, 41 y.o.   MRN: [id number] ? ? ?IUD INSERTION ?Patient name: Paula King MRN [id number]  Date of birth: 10/11/1980 ?Subjective Findings:   ?Paula King is a 41 y.o. [id number] Caucasian female being seen today for insertion of a Paragard IUD. And she noticed left breast mass. ?She had miscarriage in November and does not want to be pregnant again, any time soon ?She has history of PE. ?Periods usually last about 3-4 days. ? ?Patient's last menstrual period was 08/19/2021 (exact date). ?Last sexual intercourse was 08/22/21 ?Last pap 09/21/19. Results were: NILM w/ HRHPV negative ? ?The risks and benefits of the method and placement have been thouroughly reviewed with the patient and all questions were answered.  Specifically the patient is aware of failure rate of 06/998, expulsion of the IUD and of possible perforation.  The patient is aware of irregular bleeding due to the method and understands the incidence of irregular bleeding diminishes with time.  Signed copy of informed consent in chart.  ? ?Depression screen [facility] 2/9 02/10/2021 11/06/2020 01/11/2020 12/21/2019 10/22/2019  ?Decreased Interest 0 2 0 0 0  ?Down, Depressed, Hopeless 1 2 0 1 0  ?PHQ - 2 Score 1 4 0 1 0  ?Altered sleeping 1 2 0 0 -  ?Tired, decreased energy 1 2 0 0 -  ?Change in appetite 0 2 0 0 -  ?Feeling bad or failure about yourself  0 0 0 0 -  ?Trouble concentrating 0 2 0 0 -  ?Moving slowly or fidgety/restless 0 0 0 0 -  ?Suicidal thoughts 0 0 0 0 -  ?PHQ-9 Score 3 12 0 1 -  ?Difficult doing work/chores - - Not difficult at all Somewhat difficult -  ?Some recent data might be hidden  ? ?  ?GAD 7 : Generalized Anxiety Score 02/10/2021 11/06/2020 12/21/2019 05/24/2018  ?Nervous, Anxious, on Edge 0 1 0 0  ?Control/stop worrying 0 0 0 0  ?Worry too much - different things 0 0 0 0  ?Trouble relaxing 0 2 0 0  ?Restless 0 0 0 0  ?Easily annoyed or irritable 0 2 1 0  ?Afraid - awful might happen 0 0 0 0   ?Total GAD 7 Score 0 5 1 0  ?Anxiety Difficulty - - Not difficult at all Not difficult at all  ? ? ? ?Pertinent History Reviewed:   ?Reviewed past medical,surgical, social, obstetrical and family history.  ?Reviewed problem list, medications and allergies. ?Objective Findings & Procedure:  ? ?Vitals:  ? 09/04/21 0839  ?BP: 103/69  ?Pulse: 72  ?Weight: 211 lb (95.7 kg)  ?Height: '5\' 4"'$  (1.626 m)  ?Body mass index is 36.22 kg/m?. ? ?Results for orders placed or performed in visit on 09/04/21 (from the past 24 hour(s))  ?POCT urine pregnancy  ? Collection Time: 09/04/21  8:43 AM  ?Result Value Ref Range  ? Preg Test, Ur Negative Negative  ?  Skin warm and dry,  Breasts:no dominate palpable mass, retraction or nipple discharge on the right, on the left no retraction or nipple discharge, has ridge 5-7 0' clock and pea sized mass at 7 0' clock. ? ?Time out was performed. ? ?A Graves  speculum was placed in the vagina.  The cervix was visualized, prepped using Betadine, and grasped with a single tooth tenaculum. The uterus was found to be neutral and it sounded to 7 cm.OS small. ?Paragard  IUD placed per manufacturer's recommendations,  by Dr Elonda Husky. The strings were trimmed to approximately 3 cm. The patient tolerated the procedure well.  ? Upstream - 09/04/21 0840   ? ?  ? Pregnancy Intention Screening  ? Does the patient want to become pregnant in the next year? No   ? Does the patient's partner want to become pregnant in the next year? No   ? Would the patient like to discuss contraceptive options today? Yes   ?  ? Contraception Wrap Up  ? Current Method Withdrawal or Other Method   ? End Method IUD or IUS   ? Contraception Counseling Provided Yes   ? ?  ?  ? ?  ?  ?Chaperone: Glenard H[place]   ?Assessment & Plan:   ?1) Paragard IUD insertion ?The patient was given post procedure instructions, including signs and symptoms of infection and to check for the strings after each menses or each month, and refraining from intercourse  or anything in the vagina for 3 days. ?She was given a care card with date IUD placed, and date IUD to be removed. ?She is scheduled for a f/u appointment in 4 weeks. ?2) left breast mass  ?Scheduled diagnostic mammogram for 10/06/21 at 10:20 am at [facility] ?Orders Placed This Encounter  ?Procedures  ? US BREAST LTD UNI RIGHT INC AXILLA  ? MM DIAG BREAST TOMO BILATERAL  ? US BREAST LTD UNI LEFT INC AXILLA  ? POCT urine pregnancy  ? ? ?Return in about 4 weeks (around 10/02/2021) for IUD check with me . ? ?Derrek Monaco NP  ?09/04/2021 ?9:27 AM  ?

## 2021-09-16 ENCOUNTER — Other Ambulatory Visit: Payer: Self-pay | Admitting: Adult Health

## 2021-09-16 MED ORDER — MEGESTROL ACETATE 40 MG PO TABS: ORAL_TABLET | ORAL | 1 refills | Status: DC

## 2021-09-16 NOTE — Progress Notes (Signed)
Rx megace for bleeding  ?

## 2021-10-06 ENCOUNTER — Ambulatory Visit (HOSPITAL_COMMUNITY): Payer: Medicaid Other

## 2021-10-06 ENCOUNTER — Encounter (HOSPITAL_COMMUNITY): Payer: Medicaid Other

## 2021-10-08 ENCOUNTER — Ambulatory Visit: Payer: Medicaid Other | Admitting: Adult Health

## 2021-10-22 ENCOUNTER — Telehealth: Payer: Self-pay | Admitting: "Endocrinology

## 2021-10-22 ENCOUNTER — Encounter: Payer: Self-pay | Admitting: "Endocrinology

## 2021-10-22 ENCOUNTER — Other Ambulatory Visit: Payer: Self-pay | Admitting: "Endocrinology

## 2021-10-22 DIAGNOSIS — D3501 Benign neoplasm of right adrenal gland: Secondary | ICD-10-CM

## 2021-10-22 NOTE — Telephone Encounter (Signed)
Pt left voicemail to sch a follow up, tried to call patient back no answer. If patient calls back she needs to sch a follow up.  ?

## 2021-10-26 ENCOUNTER — Other Ambulatory Visit: Payer: Self-pay

## 2021-10-26 DIAGNOSIS — E049 Nontoxic goiter, unspecified: Secondary | ICD-10-CM

## 2021-10-28 LAB — BASIC METABOLIC PANEL
BUN: 14 (ref 4–21)
CO2: 21 (ref 13–22)
Chloride: 102 (ref 99–108)
Creatinine: 0.8 (ref 0.5–1.1)
Glucose: 76
Potassium: 3.7 mEq/L (ref 3.5–5.1)
Sodium: 140 (ref 137–147)

## 2021-10-28 LAB — TSH: TSH: 0.75 (ref 0.41–5.90)

## 2021-10-28 LAB — COMPREHENSIVE METABOLIC PANEL
Albumin: 4.4 (ref 3.5–5.0)
Calcium: 9.8 (ref 8.7–10.7)
Globulin: 2.3

## 2021-10-28 LAB — HEPATIC FUNCTION PANEL
ALT: 39 U/L — AB (ref 7–35)
AST: 36 — AB (ref 13–35)
Alkaline Phosphatase: 59 (ref 25–125)
Bilirubin, Total: 0.4

## 2021-11-10 ENCOUNTER — Ambulatory Visit (HOSPITAL_COMMUNITY)
Admission: RE | Admit: 2021-11-10 | Discharge: 2021-11-10 | Disposition: A | Discharge status: Home / Self Care | Payer: Medicaid Other | Source: Ambulatory Visit | Attending: Adult Health | Admitting: Adult Health

## 2021-11-10 DIAGNOSIS — N6324 Unspecified lump in the left breast, lower inner quadrant: Secondary | ICD-10-CM | POA: Diagnosis not present

## 2021-11-10 IMAGING — US US BREAST*L* LIMITED INC AXILLA
1 series · 6 of 6 positions shown · non-contrast
Comparison: None available.

CLINICAL DATA: Palpable lump in the medial inferior left breast.

EXAM:
DIGITAL DIAGNOSTIC BILATERAL MAMMOGRAM WITH TOMOSYNTHESIS AND CAD;
ULTRASOUND LEFT BREAST LIMITED
TECHNIQUE: Bilateral digital diagnostic mammography and breast tomosynthesis
was performed. The images were evaluated with computer-aided
detection.; Targeted ultrasound examination of the left breast was
performed.

[Series 1: us breast ltd uni left inc axilla · 6 of 6 slices shown]
[im 1/6]
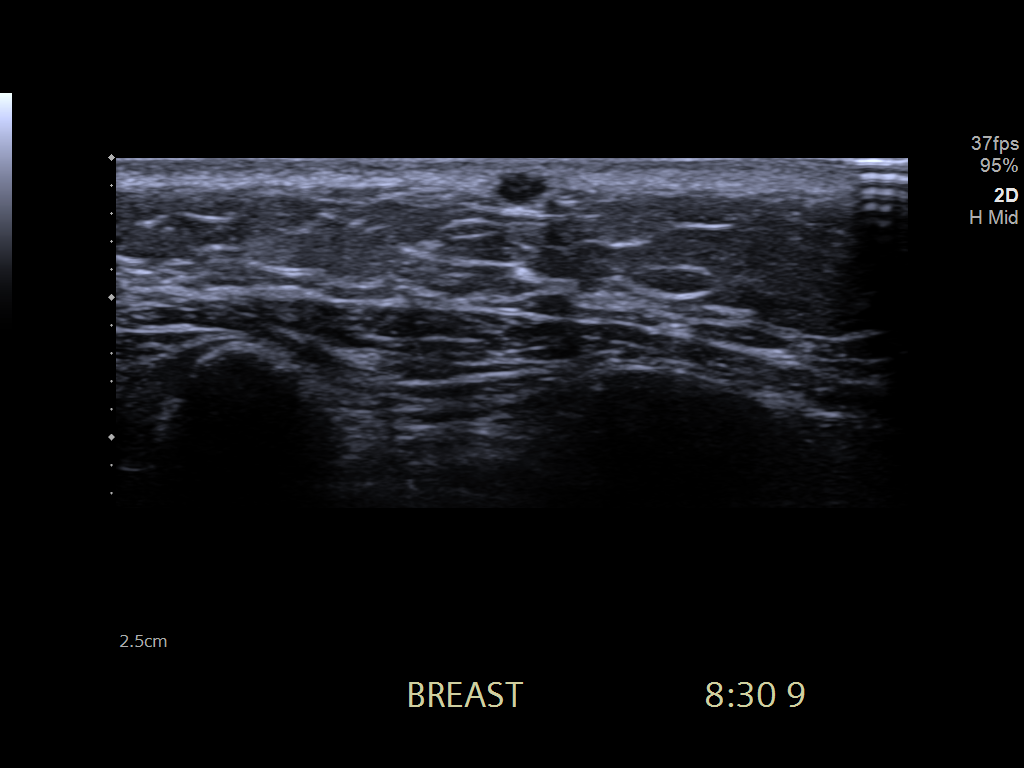
[im 2/6]
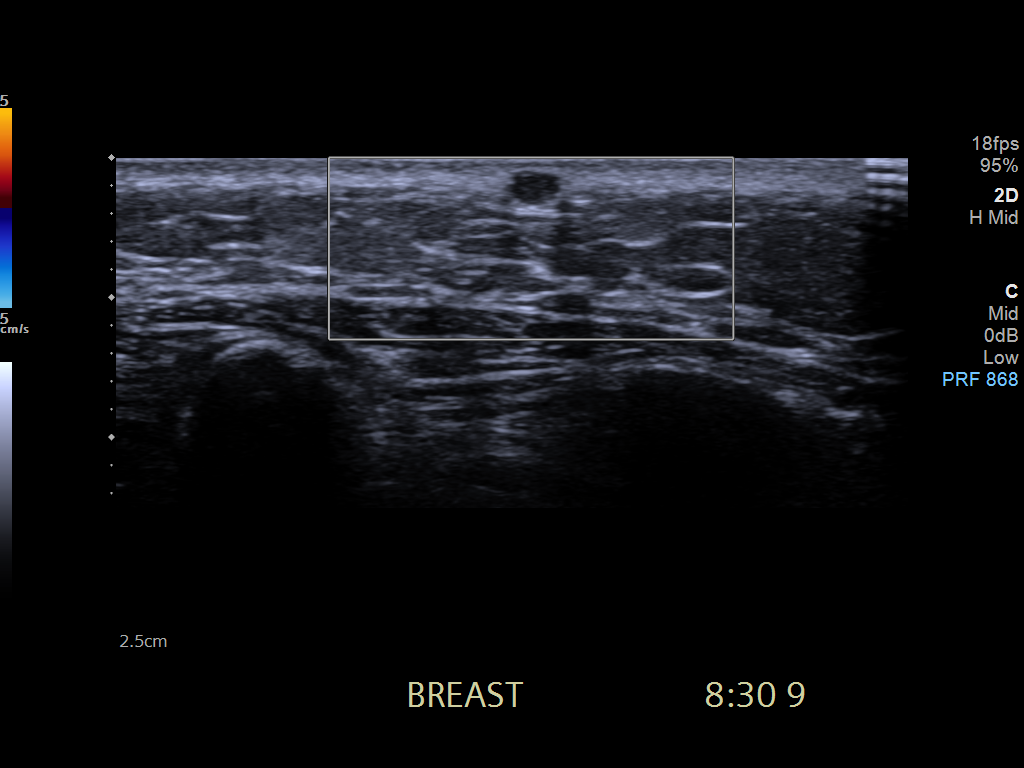
[im 3/6]
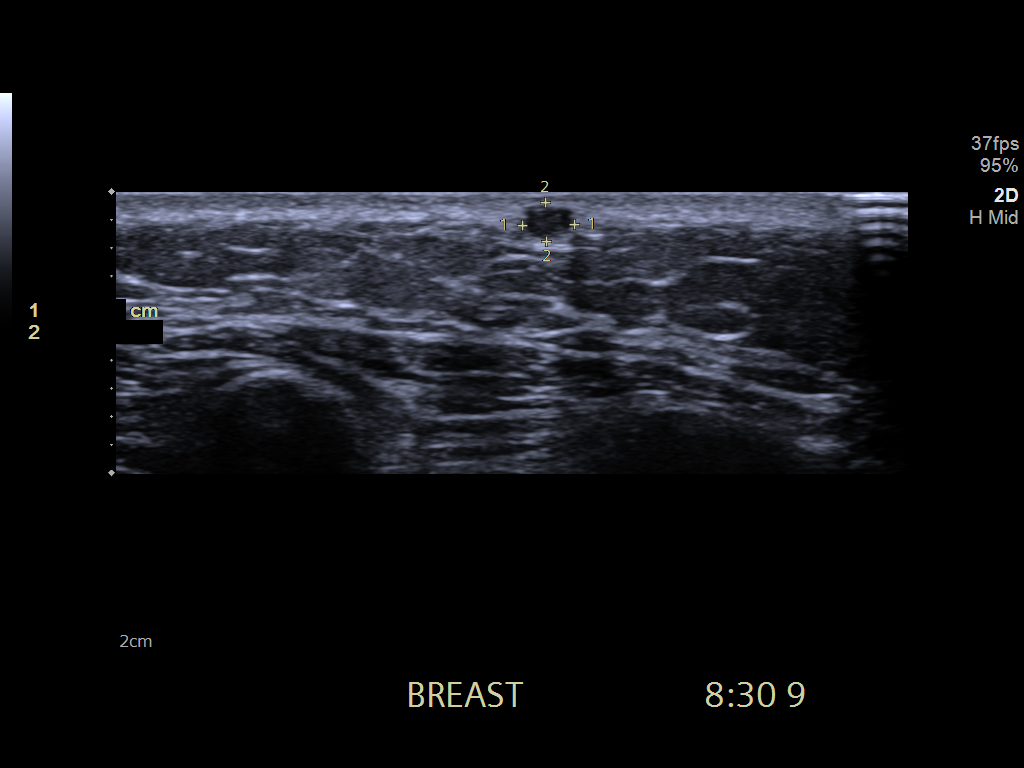
[im 4/6]
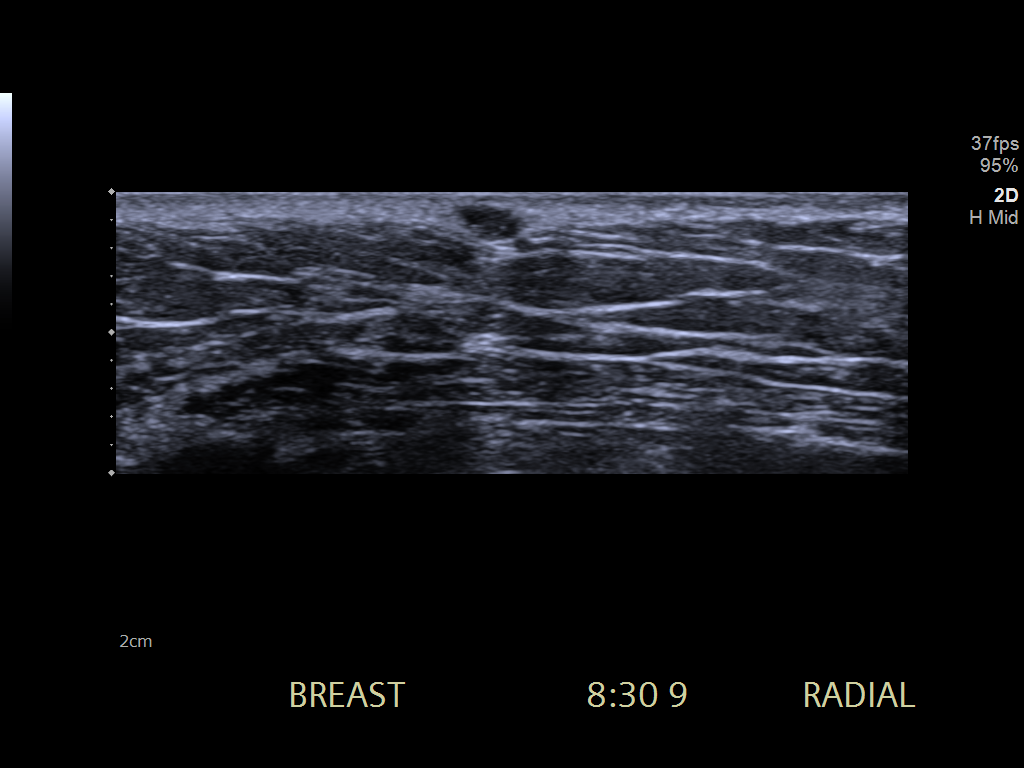
[im 5/6]
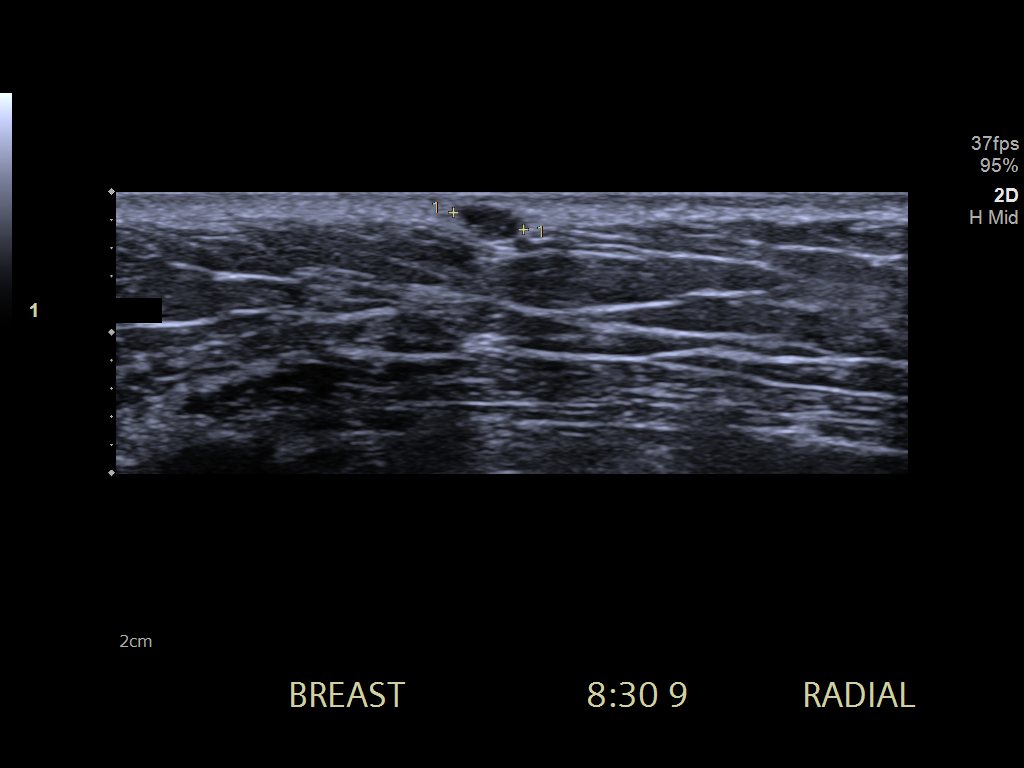
[im 6/6]
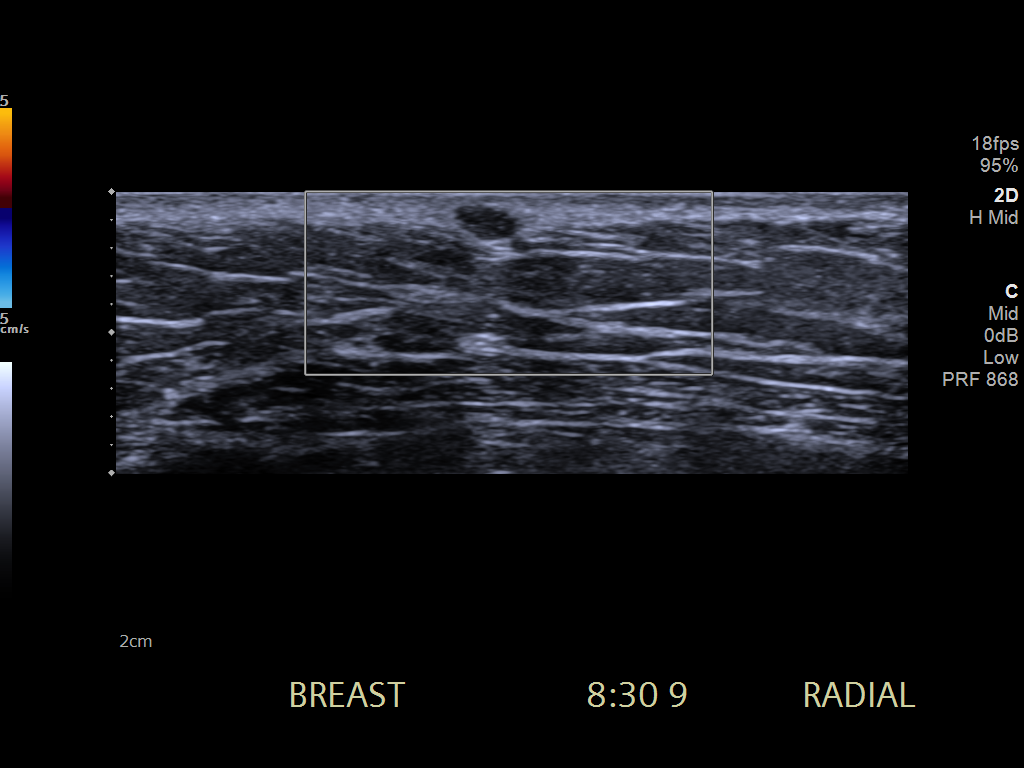

[6 of 6 positions shown; findings below may reference images not displayed]

ACR Breast Density Category c: The breast tissue is heterogeneously
dense, which may obscure small masses.
FINDINGS: There is a superficial mass containing calcifications, likely a
sebaceous gland. On the MLO view, this is clearly skin related. No
suspicious masses, calcifications, or distortion identified in
either breast.

On physical exam, there is a superficial lump pointed out by the
patient located medially.

Targeted ultrasound is performed, showing a sebaceous gland with a
clear connection between the gland in the skin surface at [DATE], 9 cm
from the nipple in the left breast accounting for the patient's
symptoms.
IMPRESSION: The patient is palpating a sebaceous gland. No evidence of
malignancy.

RECOMMENDATION:
Recommend annual screening mammography.

I have discussed the findings and recommendations with the patient.
If applicable, a reminder letter will be sent to the patient
regarding the next appointment.

BI-RADS CATEGORY  2: Benign.

## 2021-11-10 IMAGING — MG DIGITAL DIAGNOSTIC BILAT W/ TOMO W/ CAD
8 of 12 series · 8 of 32 positions shown · non-contrast
Comparison: None available.

CLINICAL DATA: Palpable lump in the medial inferior left breast.

EXAM:
DIGITAL DIAGNOSTIC BILATERAL MAMMOGRAM WITH TOMOSYNTHESIS AND CAD;
ULTRASOUND LEFT BREAST LIMITED
TECHNIQUE: Bilateral digital diagnostic mammography and breast tomosynthesis
was performed. The images were evaluated with computer-aided
detection.; Targeted ultrasound examination of the left breast was
performed.

[L MLO (1 of 2)]
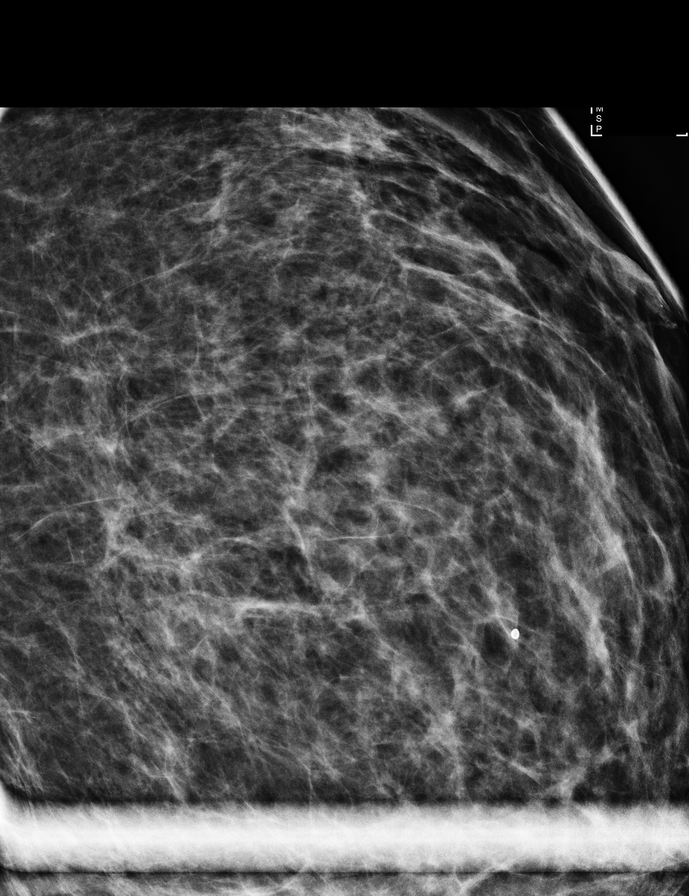

[L MLO (2 of 2)]
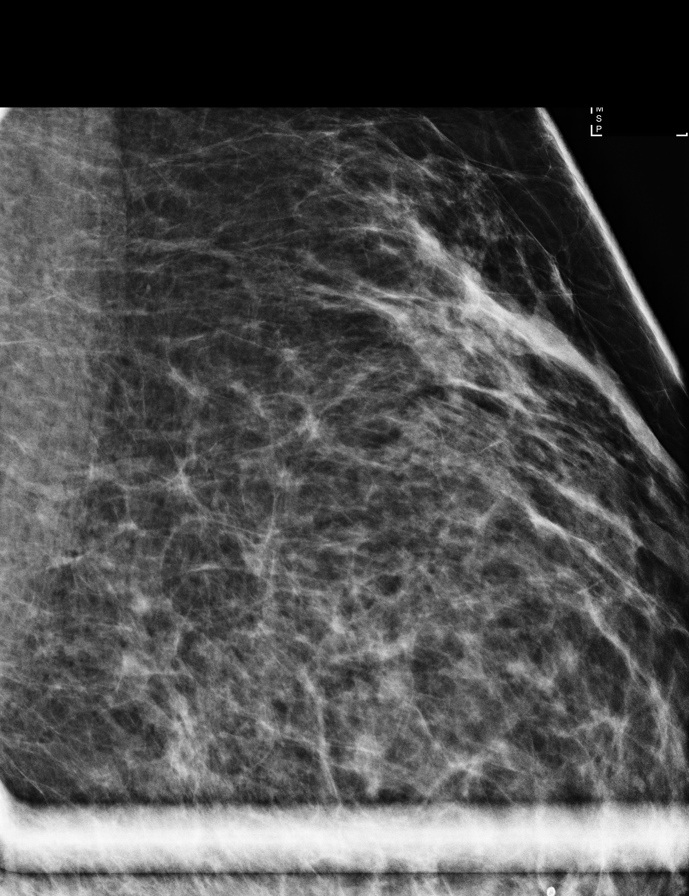

[R MLO synth-2D]
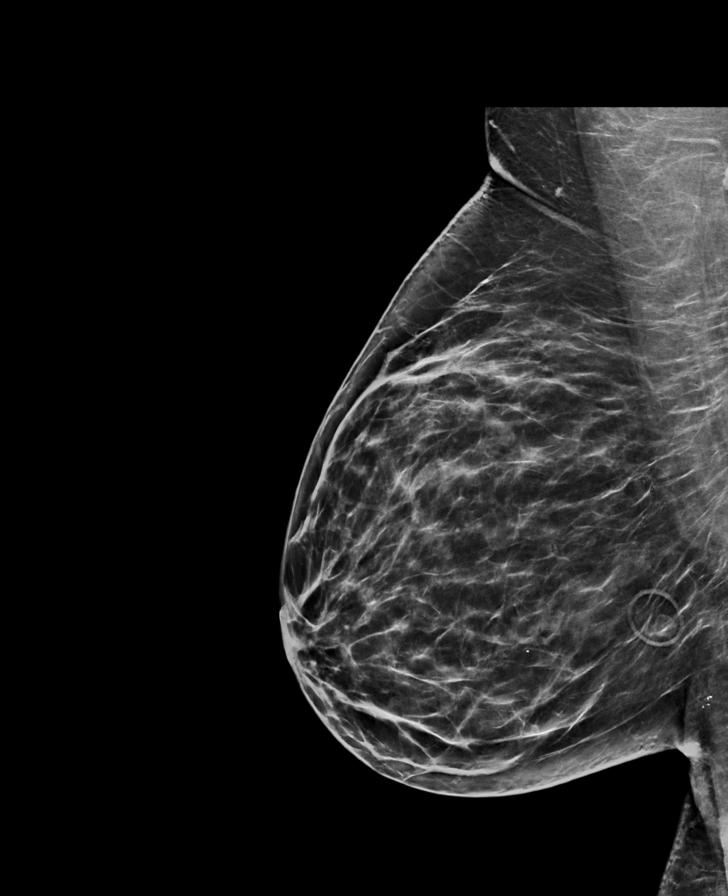

[L CC synth-2D]
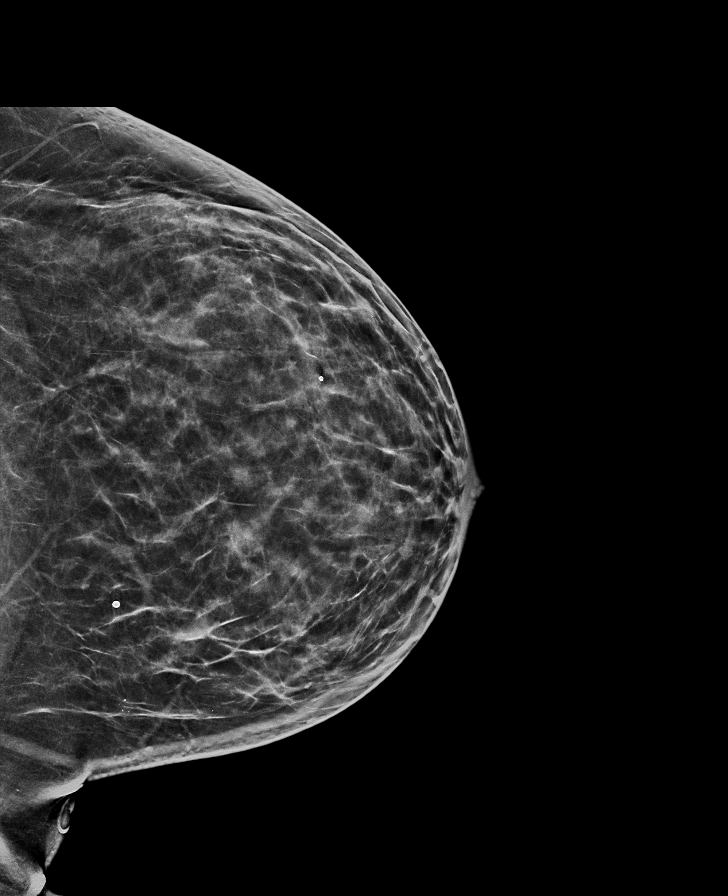

[L MLO synth-2D (1 of 2)]
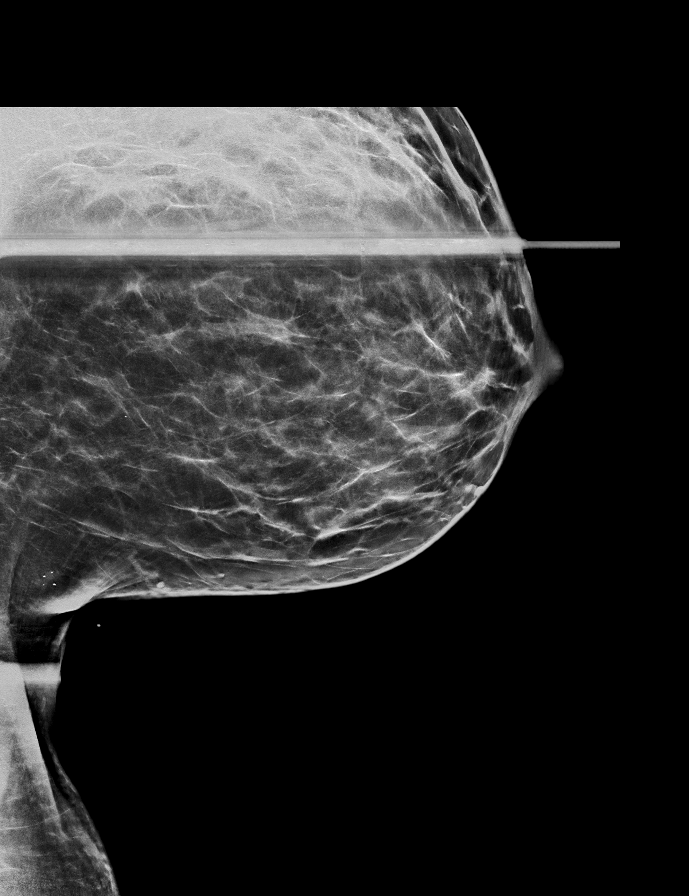

[R CC synth-2D]
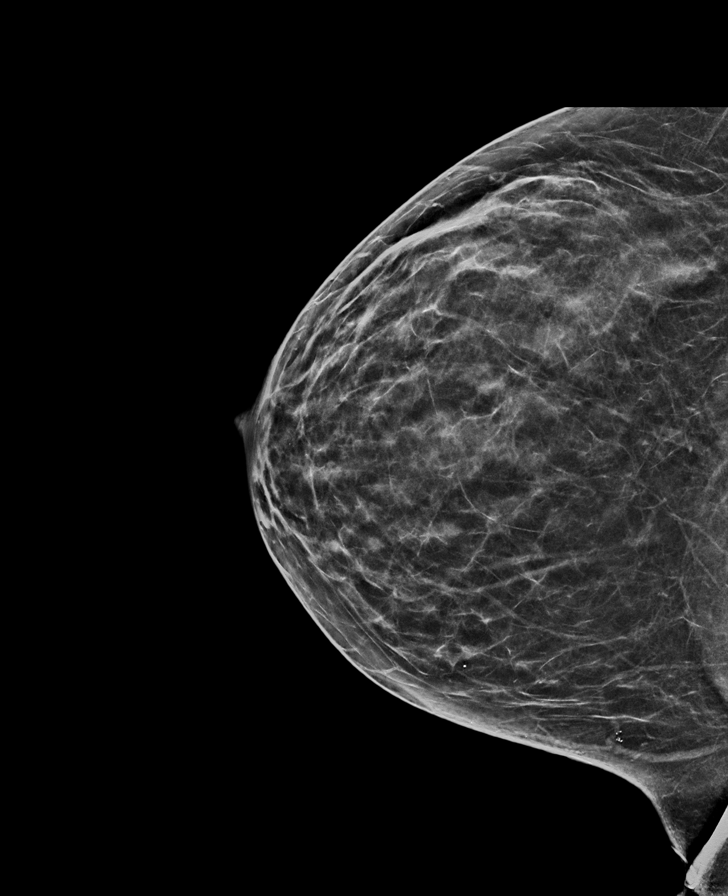

[L MLO synth-2D (2 of 2)]
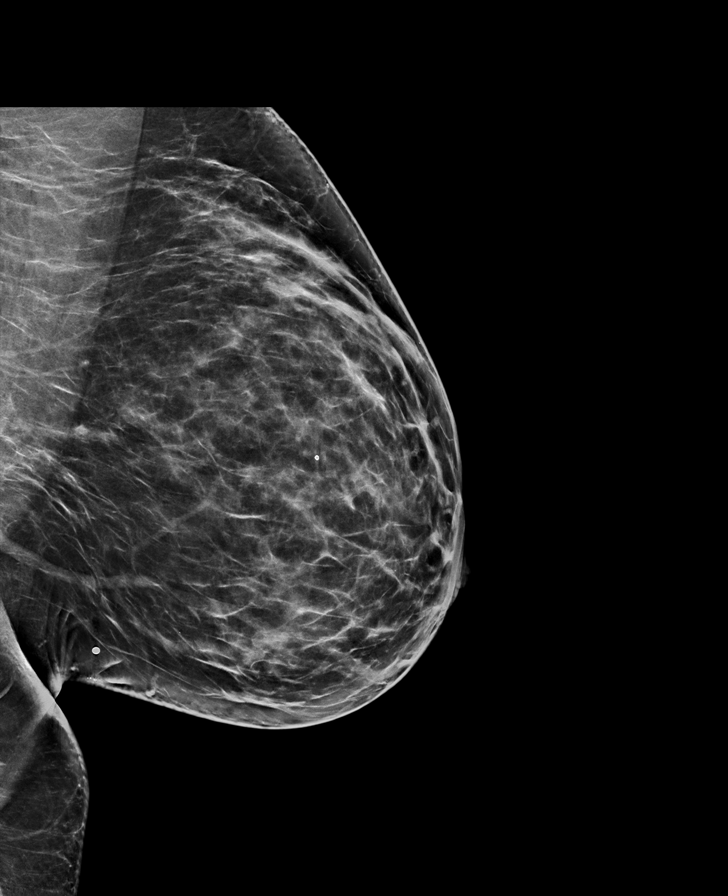

[L MLO tomo · tomo slice 33/64.0]
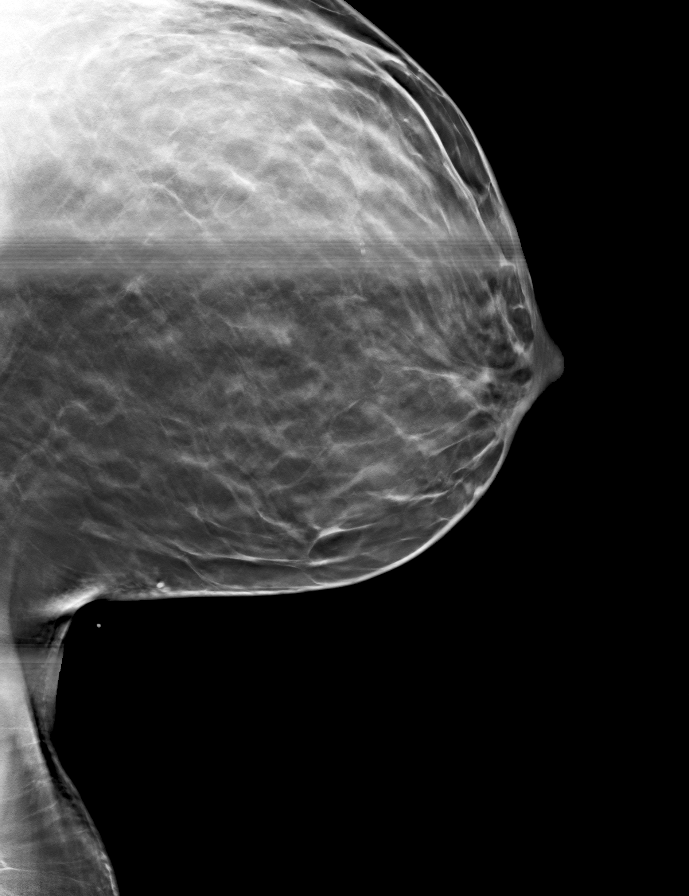

[8 of 32 positions shown; findings below may reference images not displayed]

ACR Breast Density Category c: The breast tissue is heterogeneously
dense, which may obscure small masses.
FINDINGS: There is a superficial mass containing calcifications, likely a
sebaceous gland. On the MLO view, this is clearly skin related. No
suspicious masses, calcifications, or distortion identified in
either breast.

On physical exam, there is a superficial lump pointed out by the
patient located medially.

Targeted ultrasound is performed, showing a sebaceous gland with a
clear connection between the gland in the skin surface at [DATE], 9 cm
from the nipple in the left breast accounting for the patient's
symptoms.
IMPRESSION: The patient is palpating a sebaceous gland. No evidence of
malignancy.

RECOMMENDATION:
Recommend annual screening mammography.

I have discussed the findings and recommendations with the patient.
If applicable, a reminder letter will be sent to the patient
regarding the next appointment.

BI-RADS CATEGORY  2: Benign.

## 2021-12-04 ENCOUNTER — Telehealth: Payer: Self-pay | Admitting: "Endocrinology

## 2021-12-04 NOTE — Telephone Encounter (Signed)
Call [phone]

## 2021-12-04 NOTE — Telephone Encounter (Signed)
Santiago Glad with [facility] called about the authorization for abdomen CT. States it is out of patient network for the site chose for this test which is [facility]. They are requesting a call back [phone]-- tracking # [id number].  Requesting to speak with Maudie Mercury.

## 2021-12-04 NOTE — Telephone Encounter (Signed)
Called [facility] and they state the PA may take a little longer than normal. They will need to find out if pt can use the Out of network facility for her CT

## 2021-12-07 ENCOUNTER — Encounter (HOSPITAL_COMMUNITY): Payer: Self-pay

## 2021-12-07 ENCOUNTER — Emergency Department (HOSPITAL_COMMUNITY): Payer: Medicaid Other

## 2021-12-07 ENCOUNTER — Inpatient Hospital Stay (HOSPITAL_COMMUNITY)
Admission: EM | Admit: 2021-12-07 | Discharge: 2021-12-09 | DRG: 093 | Disposition: A | Discharge status: Home / Self Care | Payer: Medicaid Other | Attending: Family Medicine | Admitting: Family Medicine

## 2021-12-07 ENCOUNTER — Other Ambulatory Visit: Payer: Self-pay

## 2021-12-07 DIAGNOSIS — Z833 Family history of diabetes mellitus: Secondary | ICD-10-CM

## 2021-12-07 DIAGNOSIS — F419 Anxiety disorder, unspecified: Secondary | ICD-10-CM | POA: Diagnosis present

## 2021-12-07 DIAGNOSIS — F909 Attention-deficit hyperactivity disorder, unspecified type: Secondary | ICD-10-CM

## 2021-12-07 DIAGNOSIS — Z9884 Bariatric surgery status: Secondary | ICD-10-CM

## 2021-12-07 DIAGNOSIS — F988 Other specified behavioral and emotional disorders with onset usually occurring in childhood and adolescence: Secondary | ICD-10-CM | POA: Diagnosis present

## 2021-12-07 DIAGNOSIS — Z6836 Body mass index (BMI) 36.0-36.9, adult: Secondary | ICD-10-CM

## 2021-12-07 DIAGNOSIS — Z8249 Family history of ischemic heart disease and other diseases of the circulatory system: Secondary | ICD-10-CM

## 2021-12-07 DIAGNOSIS — Z818 Family history of other mental and behavioral disorders: Secondary | ICD-10-CM

## 2021-12-07 DIAGNOSIS — Z8616 Personal history of COVID-19: Secondary | ICD-10-CM

## 2021-12-07 DIAGNOSIS — Z86711 Personal history of pulmonary embolism: Secondary | ICD-10-CM

## 2021-12-07 DIAGNOSIS — E876 Hypokalemia: Secondary | ICD-10-CM | POA: Diagnosis present

## 2021-12-07 DIAGNOSIS — G9341 Metabolic encephalopathy: Secondary | ICD-10-CM

## 2021-12-07 DIAGNOSIS — G9340 Encephalopathy, unspecified: Secondary | ICD-10-CM | POA: Diagnosis not present

## 2021-12-07 DIAGNOSIS — R748 Abnormal levels of other serum enzymes: Secondary | ICD-10-CM | POA: Diagnosis present

## 2021-12-07 DIAGNOSIS — Z825 Family history of asthma and other chronic lower respiratory diseases: Secondary | ICD-10-CM

## 2021-12-07 DIAGNOSIS — G928 Other toxic encephalopathy: Principal | ICD-10-CM | POA: Diagnosis present

## 2021-12-07 DIAGNOSIS — F32A Depression, unspecified: Secondary | ICD-10-CM | POA: Diagnosis present

## 2021-12-07 DIAGNOSIS — R2990 Unspecified symptoms and signs involving the nervous system: Secondary | ICD-10-CM | POA: Diagnosis not present

## 2021-12-07 DIAGNOSIS — Z803 Family history of malignant neoplasm of breast: Secondary | ICD-10-CM

## 2021-12-07 DIAGNOSIS — I10 Essential (primary) hypertension: Secondary | ICD-10-CM | POA: Diagnosis present

## 2021-12-07 DIAGNOSIS — T43625A Adverse effect of amphetamines, initial encounter: Secondary | ICD-10-CM | POA: Diagnosis present

## 2021-12-07 DIAGNOSIS — R29810 Facial weakness: Secondary | ICD-10-CM | POA: Diagnosis present

## 2021-12-07 DIAGNOSIS — E669 Obesity, unspecified: Secondary | ICD-10-CM | POA: Diagnosis present

## 2021-12-07 DIAGNOSIS — K219 Gastro-esophageal reflux disease without esophagitis: Secondary | ICD-10-CM | POA: Diagnosis present

## 2021-12-07 LAB — COMPREHENSIVE METABOLIC PANEL
ALT: 29 U/L (ref 0–44)
AST: 21 U/L (ref 15–41)
Albumin: 4.6 g/dL (ref 3.5–5.0)
Alkaline Phosphatase: 55 U/L (ref 38–126)
Anion gap: 7 (ref 5–15)
BUN: 21 mg/dL — ABNORMAL HIGH (ref 6–20)
CO2: 26 mmol/L (ref 22–32)
Calcium: 9.7 mg/dL (ref 8.9–10.3)
Chloride: 105 mmol/L (ref 98–111)
Creatinine, Ser: 1.01 mg/dL — ABNORMAL HIGH (ref 0.44–1.00)
GFR, Estimated: >60 mL/min (ref >60)
Glucose, Bld: 108 mg/dL — ABNORMAL HIGH (ref 70–99)
Potassium: 3.1 mmol/L — ABNORMAL LOW (ref 3.5–5.1)
Sodium: 138 mmol/L (ref 135–145)
Total Bilirubin: 0.7 mg/dL (ref 0.3–1.2)
Total Protein: 8.5 g/dL — ABNORMAL HIGH (ref 6.5–8.1)

## 2021-12-07 LAB — URINALYSIS, ROUTINE W REFLEX MICROSCOPIC
Bilirubin Urine: NEGATIVE
Glucose, UA: NEGATIVE mg/dL
Hgb urine dipstick: NEGATIVE
Ketones, ur: 80 mg/dL — AB
Leukocytes,Ua: NEGATIVE
Nitrite: NEGATIVE
Protein, ur: 30 mg/dL — AB
Specific Gravity, Urine: 1.027 (ref 1.005–1.030)
pH: 7 (ref 5.0–8.0)

## 2021-12-07 LAB — CBC
HCT: 47.8 % — ABNORMAL HIGH (ref 36.0–46.0)
Hemoglobin: 16.2 g/dL — ABNORMAL HIGH (ref 12.0–15.0)
MCH: 31.1 pg (ref 26.0–34.0)
MCHC: 33.9 g/dL (ref 30.0–36.0)
MCV: 91.7 fL (ref 80.0–100.0)
Platelets: 291 10*3/uL (ref 150–400)
RBC: 5.21 MIL/uL — ABNORMAL HIGH (ref 3.87–5.11)
RDW: 13.1 % (ref 11.5–15.5)
WBC: 10.1 10*3/uL (ref 4.0–10.5)
nRBC: 0 % (ref 0.0–0.2)

## 2021-12-07 LAB — CBG MONITORING, ED
Glucose-Capillary: 99 mg/dL (ref 70–99)
Glucose-Capillary: 108 mg/dL — ABNORMAL HIGH (ref 70–99)

## 2021-12-07 LAB — RAPID URINE DRUG SCREEN, HOSP PERFORMED
Amphetamines: NOT DETECTED
Barbiturates: NOT DETECTED
Benzodiazepines: POSITIVE — AB
Cocaine: NOT DETECTED
Opiates: NOT DETECTED
Tetrahydrocannabinol: NOT DETECTED

## 2021-12-07 LAB — DIFFERENTIAL
Abs Immature Granulocytes: 0.04 10*3/uL (ref 0.00–0.07)
Basophils Absolute: 0.1 10*3/uL (ref 0.0–0.1)
Basophils Relative: 1 %
Eosinophils Absolute: 0.1 10*3/uL (ref 0.0–0.5)
Eosinophils Relative: 1 %
Immature Granulocytes: 0 %
Lymphocytes Relative: 29 %
Lymphs Abs: 2.9 10*3/uL (ref 0.7–4.0)
Monocytes Absolute: 0.7 10*3/uL (ref 0.1–1.0)
Monocytes Relative: 7 %
Neutro Abs: 6.3 10*3/uL (ref 1.7–7.7)
Neutrophils Relative %: 62 %

## 2021-12-07 LAB — RESP PANEL BY RT-PCR (FLU A&B, COVID) ARPGX2
Influenza A by PCR: NEGATIVE
Influenza B by PCR: NEGATIVE
SARS Coronavirus 2 by RT PCR: NEGATIVE

## 2021-12-07 LAB — TSH: TSH: 1.635 u[IU]/mL (ref 0.350–4.500)

## 2021-12-07 LAB — MAGNESIUM: Magnesium: 2.1 mg/dL (ref 1.7–2.4)

## 2021-12-07 LAB — ETHANOL: Alcohol, Ethyl (B): <10 mg/dL (ref <10)

## 2021-12-07 LAB — HIV ANTIBODY (ROUTINE TESTING W REFLEX): HIV Screen 4th Generation wRfx: NONREACTIVE

## 2021-12-07 LAB — APTT: aPTT: 26 seconds (ref 24–36)

## 2021-12-07 LAB — CK: Total CK: 1068 U/L — ABNORMAL HIGH (ref 38–234)

## 2021-12-07 LAB — PROTIME-INR
INR: 1 (ref 0.8–1.2)
Prothrombin Time: 13 seconds (ref 11.4–15.2)

## 2021-12-07 LAB — MRSA NEXT GEN BY PCR, NASAL: MRSA by PCR Next Gen: NOT DETECTED

## 2021-12-07 IMAGING — MR MR MRA NECK WO/W CM
5 series · 43 of 48 positions shown · IV contrast (Gadavist)
Comparison: None Available.

CLINICAL DATA: Neuro deficit, acute, stroke suspected



[Series 13: tof_fl3d_tra_iso · axial · 0.6mm · 0.52mm/px · z∈[-25,+53]mm · 10 of 133 slices shown]
[im 1/133]
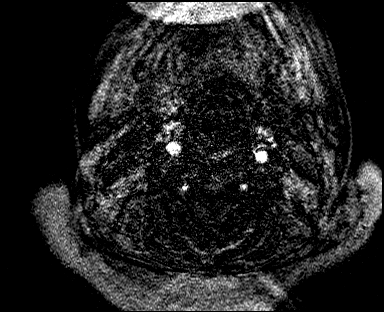
[im 10/133]
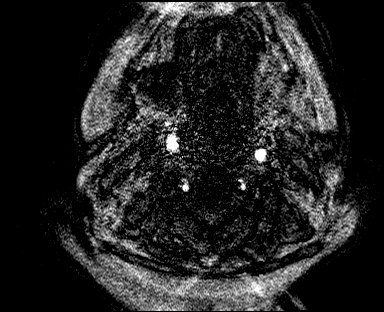
[im 19/133]
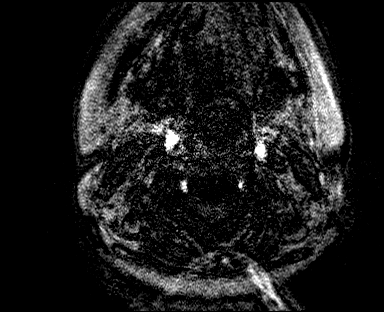
[im 38/133]
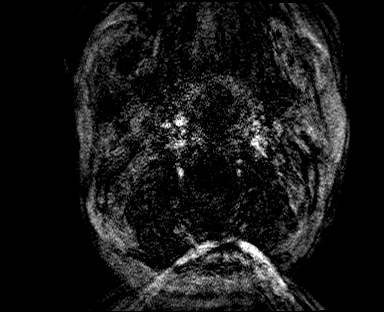
[im 57/133]
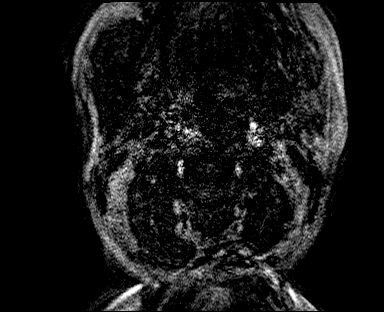
[im 67/133]
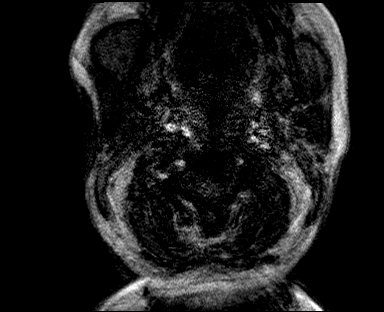
[im 76/133]
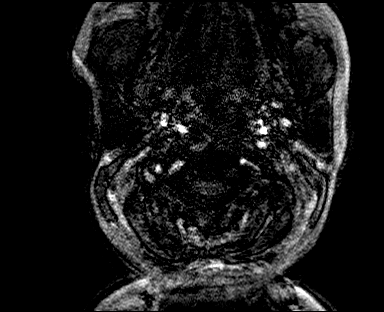
[im 95/133]
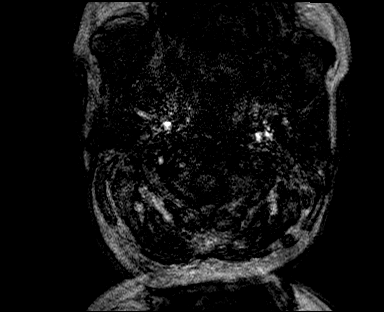
[im 114/133]
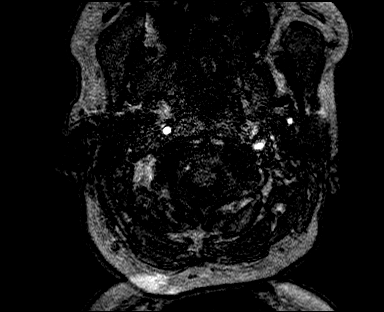
[im 133/133]
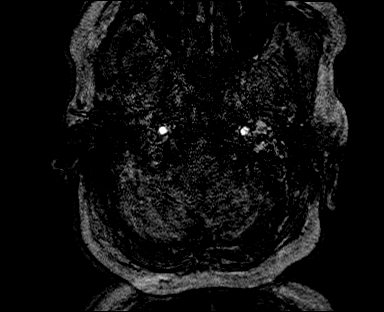

[Series 16: angio_fl3d_cor_pre_ttc=3.0s · coronal · 0.9mm · 0.85mm/px · 9 of 80 slices shown]
[im 1/80]
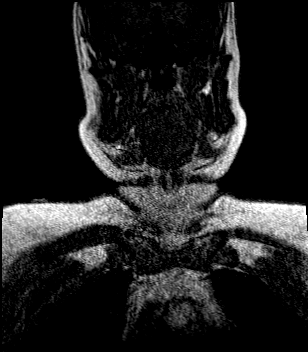
[im 10/80]
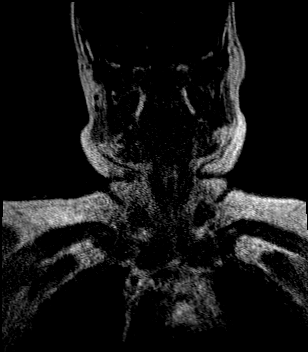
[im 20/80]
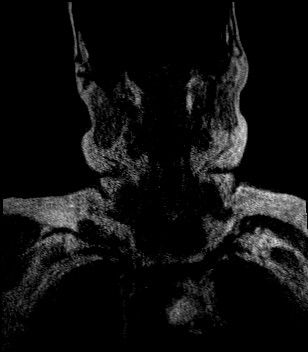
[im 30/80]
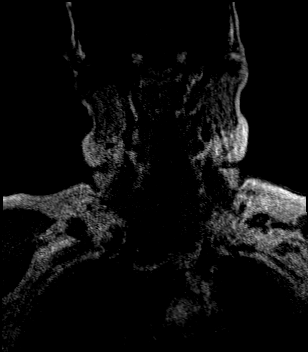
[im 40/80]
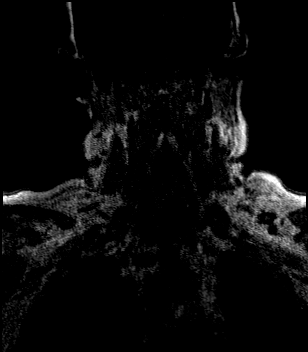
[im 50/80]
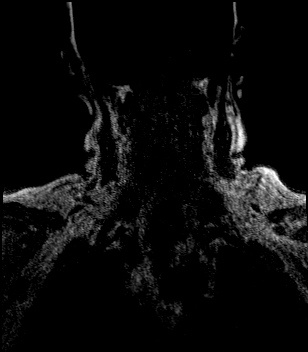
[im 60/80]
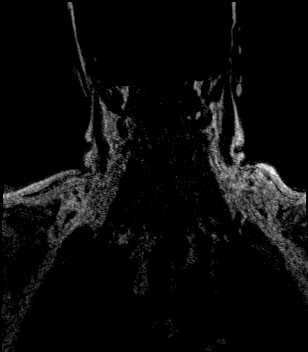
[im 70/80]
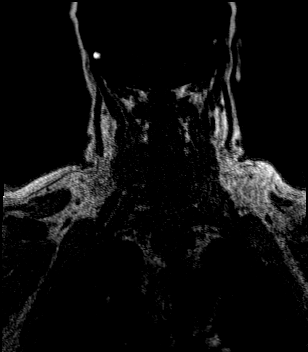
[im 80/80]
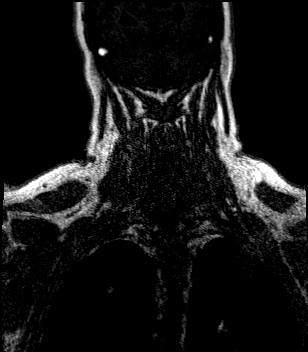

[Series 18: angio_fl3d_cor_post_ttc=3.0s · coronal · 0.9mm · 0.85mm/px · 8 of 80 slices shown]
[im 1/80]
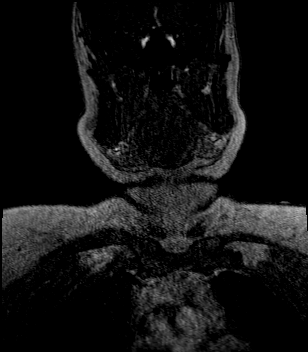
[im 12/80]
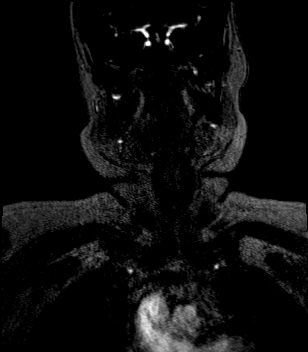
[im 23/80]
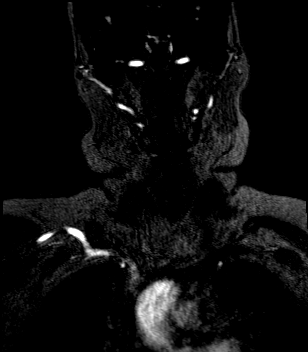
[im 34/80]
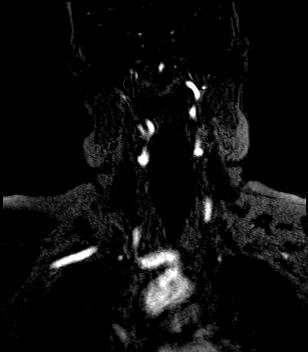
[im 46/80]
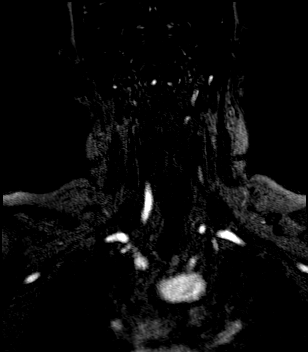
[im 57/80]
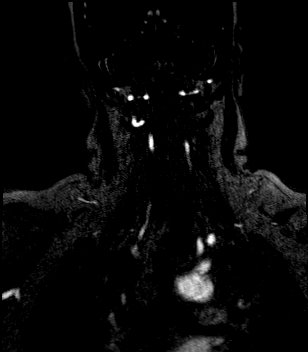
[im 68/80]
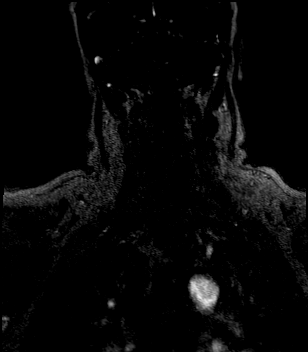
[im 80/80]
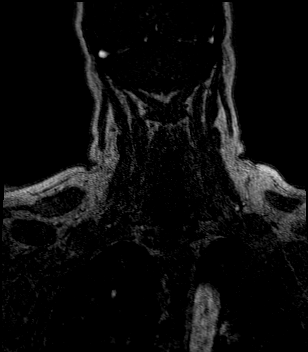

[Series 19: angio_fl3d_cor_post_ttc=3.0s_moco-adv · coronal · 0.9mm · 0.85mm/px · 8 of 80 slices shown]
[im 1/80]
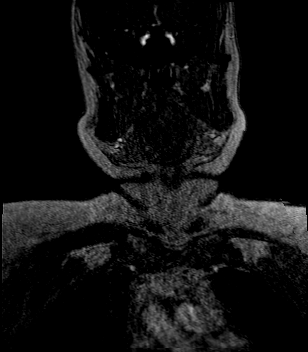
[im 12/80]
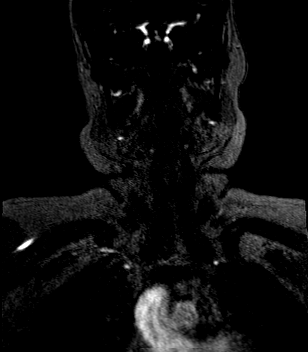
[im 23/80]
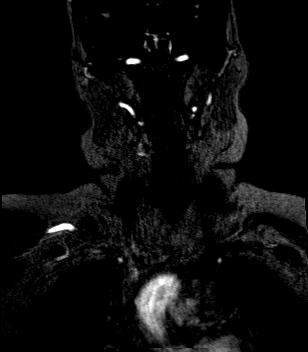
[im 34/80]
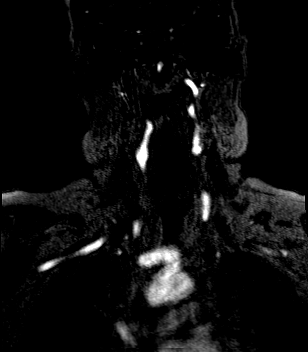
[im 46/80]
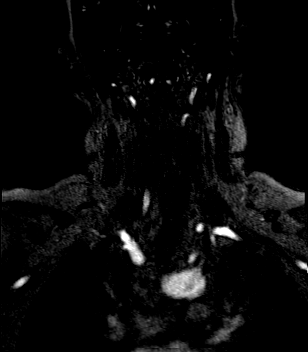
[im 57/80]
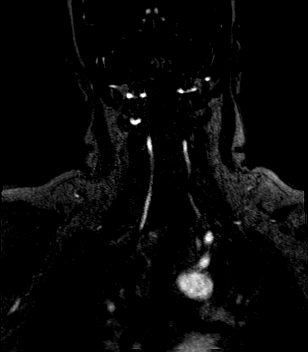
[im 68/80]
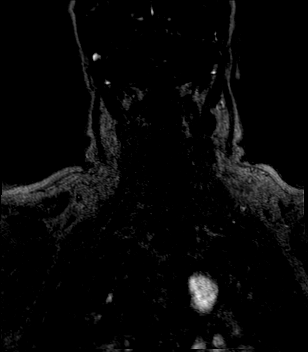
[im 80/80]
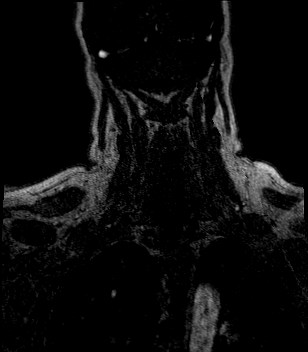

[Series 20: angio_fl3d_cor_post_ttc=3.0s_moco-adv_sub · coronal · 0.9mm · 0.85mm/px · 8 of 80 slices shown]
[im 1/80]
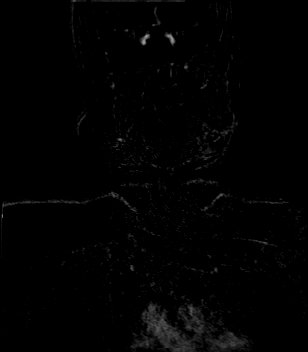
[im 12/80]
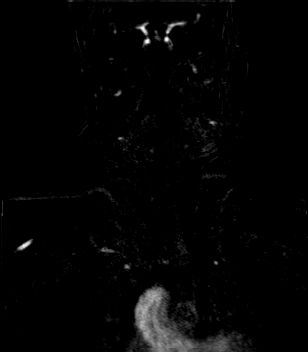
[im 23/80]
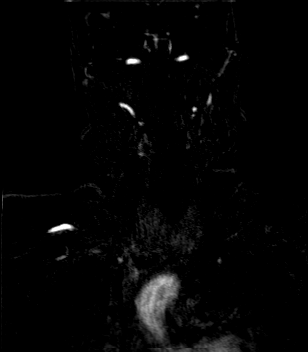
[im 34/80]
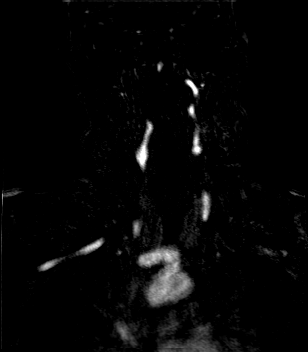
[im 46/80]
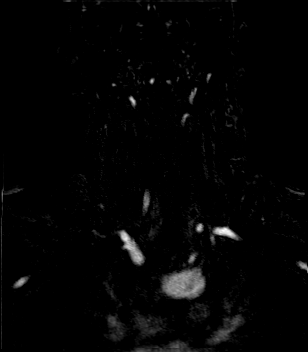
[im 57/80]
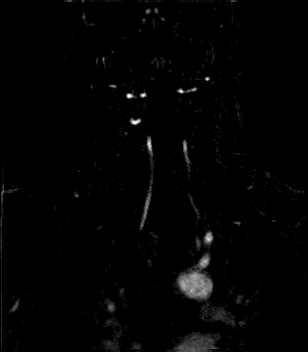
[im 68/80]
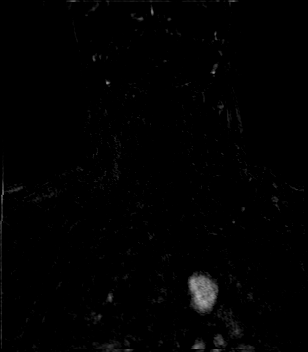
[im 80/80]
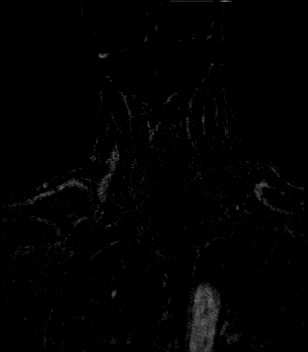

[43 of 48 positions shown; findings below may reference images not displayed]

FINDINGS: MRI HEAD

Brain: There is no acute infarction or intracranial hemorrhage.
There is no intracranial mass, mass effect, or edema. There is no
hydrocephalus or extra-axial fluid collection. Ventricles and sulci
are normal in size and configuration.

Vascular: Major vessel flow voids at the skull base are preserved.

Skull and upper cervical spine: Normal marrow signal is preserved.

Sinuses/Orbits: Paranasal sinuses are aerated. Orbits are
unremarkable.

Other: Sella is unremarkable.  Mastoid air cells are clear.

MRA HEAD

Degraded by motion. Intracranial internal carotid arteries are
patent. Proximal middle and anterior cerebral arteries are patent.
Intracranial vertebral arteries, basilar artery, proximal posterior
cerebral arteries are patent. Likely fetal origin of the left PCA.

MRA NECK

Motion artifact is present. Great vessel origins are patent. No
high-grade proximal subclavian stenosis. Common, internal, and
external carotid arteries are patent. Extracranial vertebral
arteries are patent. No hemodynamically significant stenosis
identified.
IMPRESSION: No acute infarction, hemorrhage, or mass.

No large vessel occlusion, hemodynamically significant stenosis, or
evidence dissection.

## 2021-12-07 IMAGING — CT CT HEAD CODE STROKE
3 of 4 series · 15 of 47 positions shown, 18 images · non-contrast
Comparison: None Available.

CLINICAL DATA: Code stroke. Aphasia. Left-sided weakness. Facial
droop which has resolved.



[Series 2: head w o · axial · 0.42mm/px · z∈[+92,+227]mm · 9 of 33 slices shown, 12 images]
[im 3/33  brain]
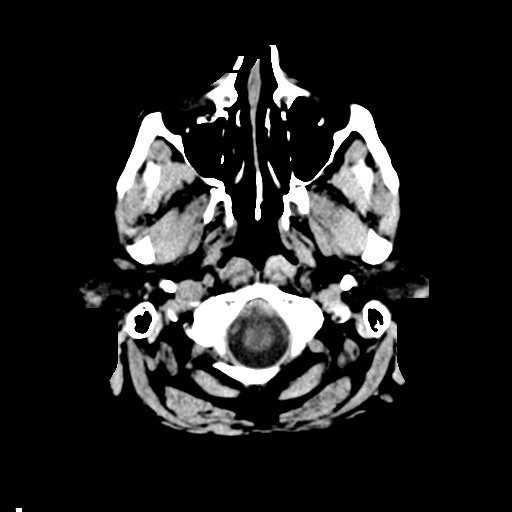
[im 3/33  bone]
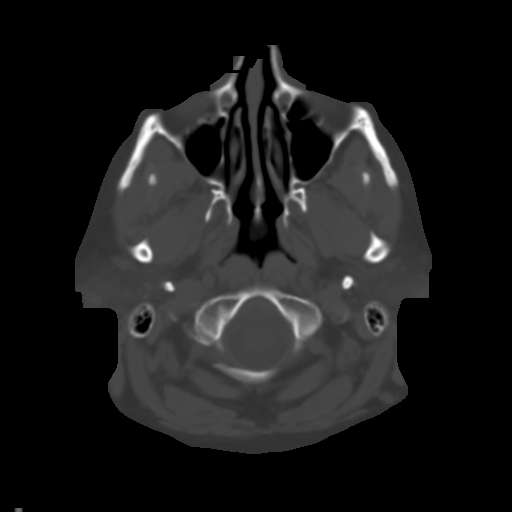
[im 7/33  brain]
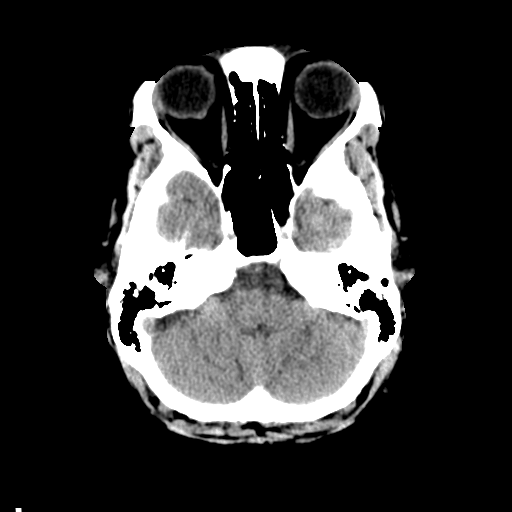
[im 10/33  brain]
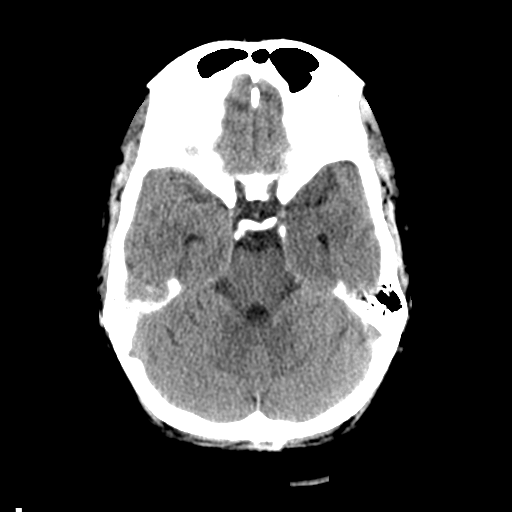
[im 14/33  brain]
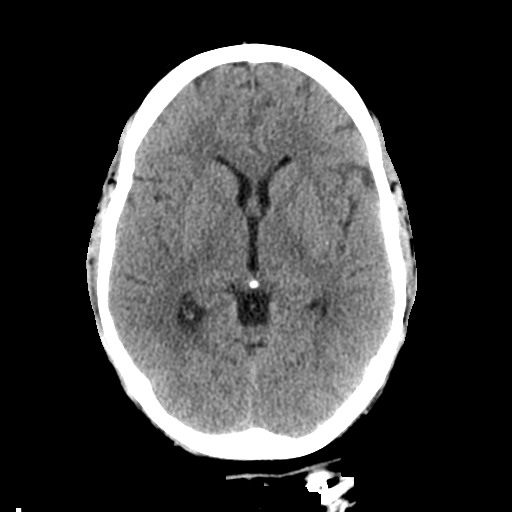
[im 17/33  brain]
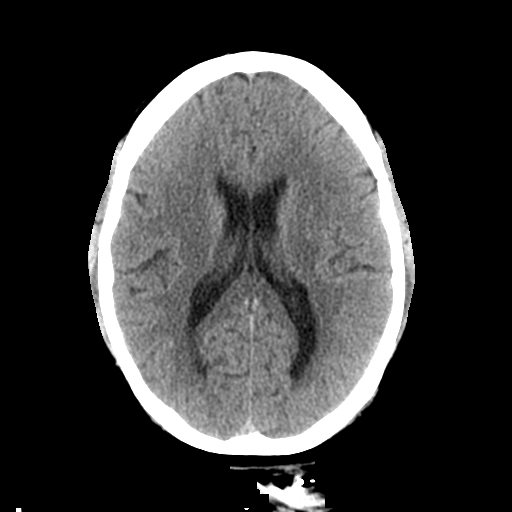
[im 17/33  bone]
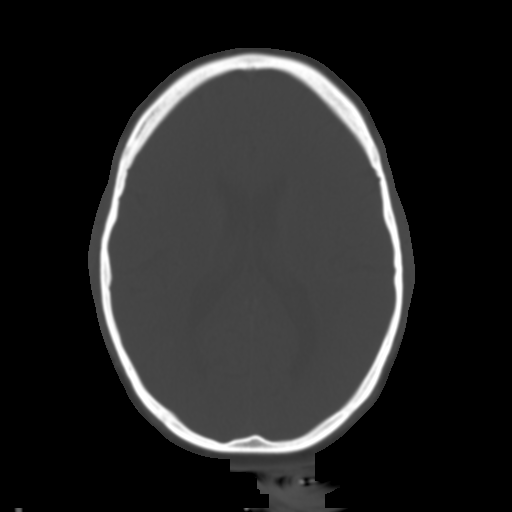
[im 19/33  brain]
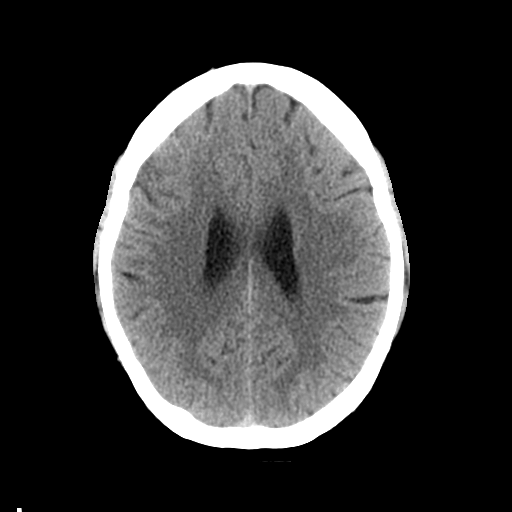
[im 23/33  brain]
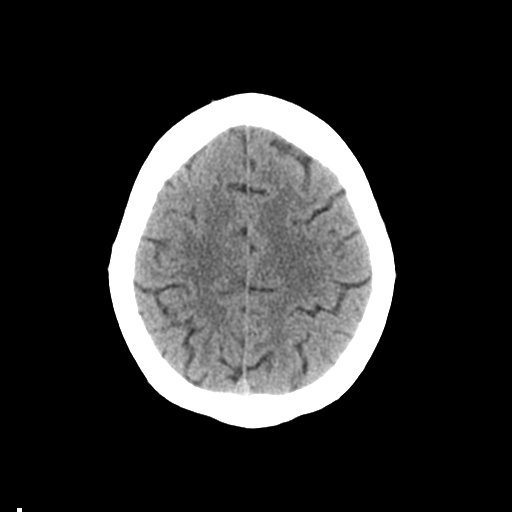
[im 26/33  brain]
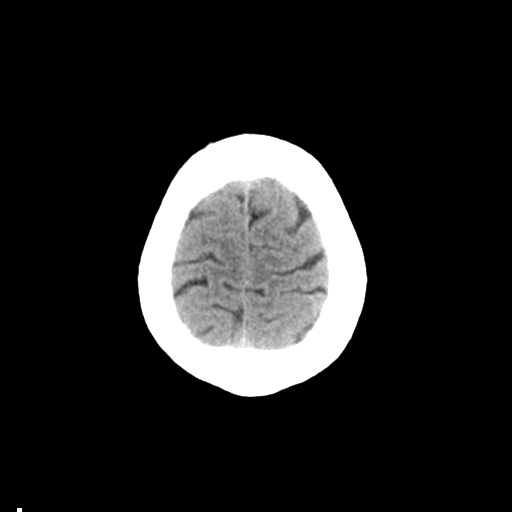
[im 30/33  brain]
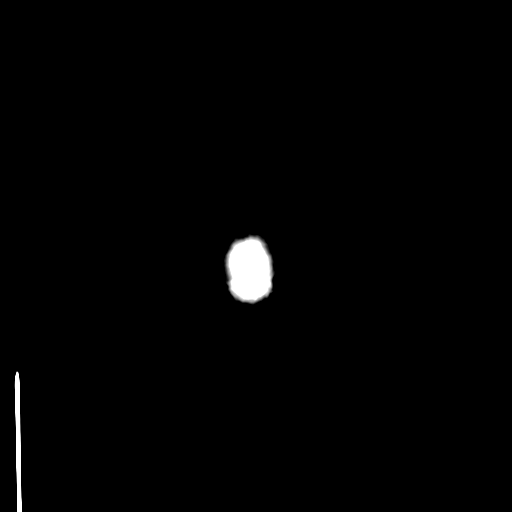
[im 30/33  bone]
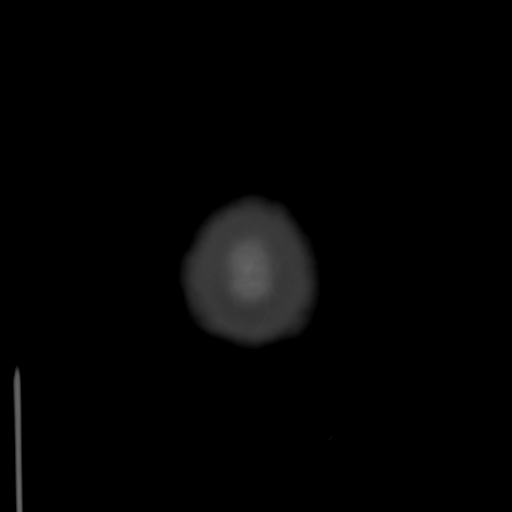

[Series 4: coronal soft · coronal · 0.32mm/px · 3 of 70 slices shown]
[im 24/70  brain]
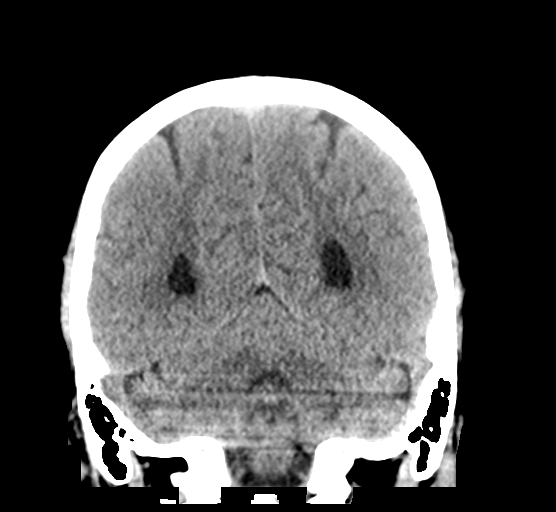
[im 31/70  brain]
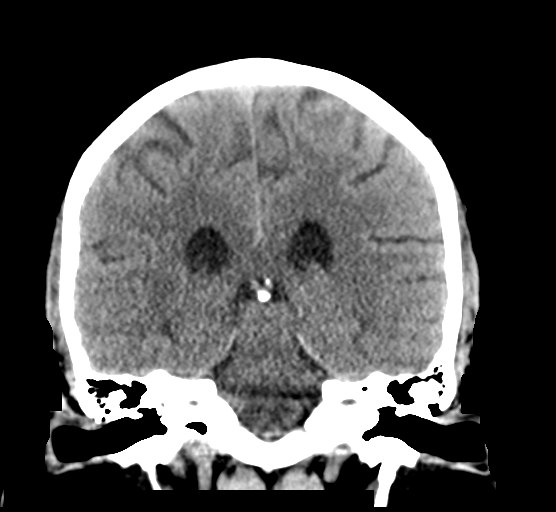
[im 39/70  brain]
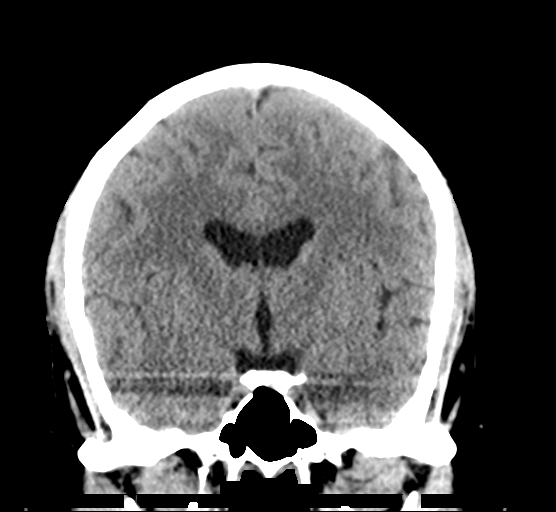

[Series 5: sagittal soft · sagittal · 0.33mm/px · 3 of 67 slices shown]
[im 23/67  brain]
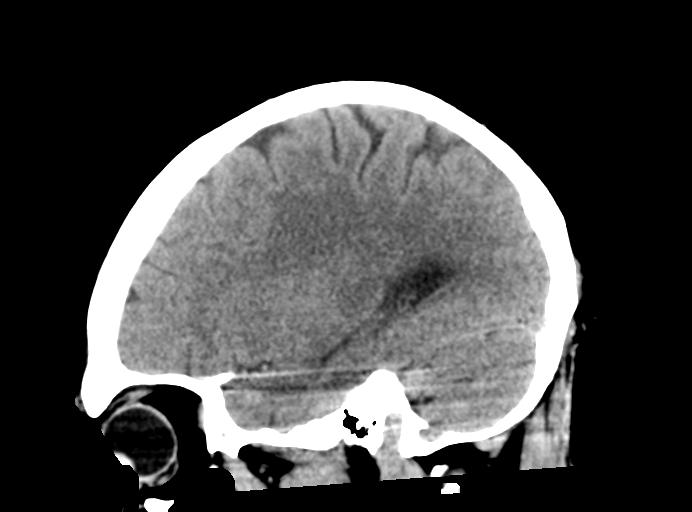
[im 34/67  brain]
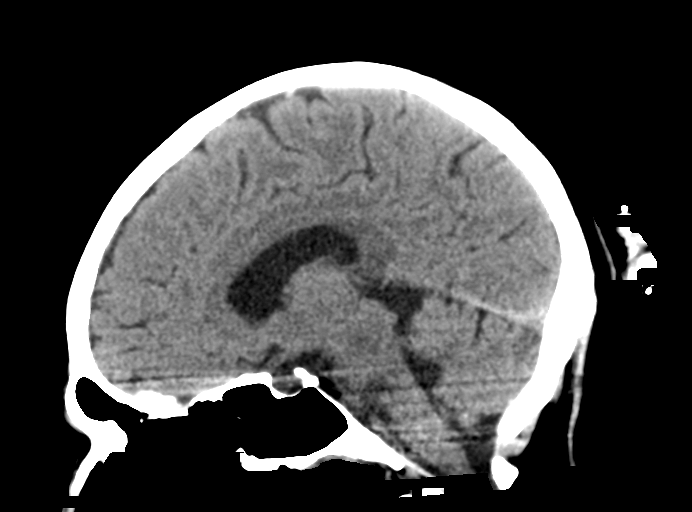
[im 45/67  brain]
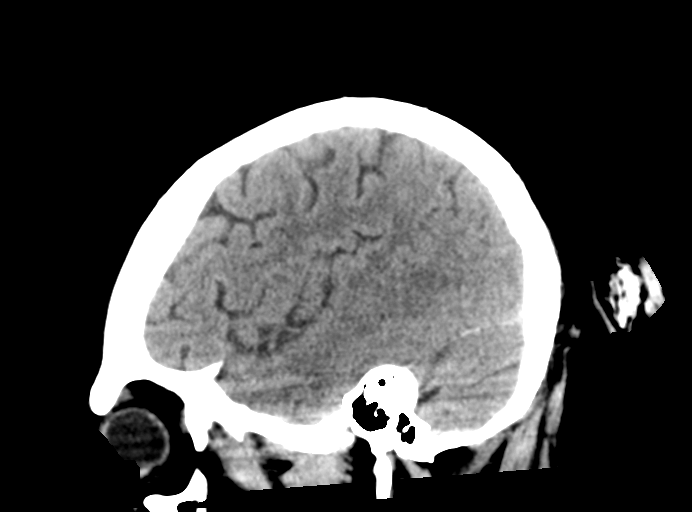

[15 of 47 positions shown; findings below may reference images not displayed]

FINDINGS: Brain: No acute infarct, hemorrhage, or mass lesion is present. No
significant white matter lesions are present. Basal ganglia are
intact. No acute or focal cortical abnormality is present. The
ventricles are of normal size. No significant extraaxial fluid
collection is present.

The brainstem and cerebellum are within normal limits.

Vascular: Flow is present in the major intracranial arteries.

Skull: Calvarium is intact. No focal lytic or blastic lesions are
present.

Sinuses/Orbits: Calvarium is intact. No focal lytic or blastic
lesions are present.

Other:

ASPECTS (Alberta Stroke Program Early CT Score)

- Ganglionic level infarction (caudate, lentiform nuclei, internal
capsule, insula, M1-M3 cortex): [DATE]

- Supraganglionic infarction (M4-M6 cortex): [DATE]

Total score (0-10 with 10 being normal): [DATE]
IMPRESSION: Negative CT of the head.

Aspects [DATE]

These results were called by telephone at the time of interpretation
on [DATE] at [DATE] to provider DEITZ , who verbally
acknowledged these results. Final report was delayed due to a delay
in information to the PACS.

## 2021-12-07 IMAGING — MR MR HEAD W/O CM
11 of 13 series · 28 of 48 positions shown · IV contrast (gadavist)
Comparison: None Available.

CLINICAL DATA: Neuro deficit, acute, stroke suspected



[Series 5: DWI · axial · 4.0mm · 0.88mm/px · z∈[-84,+66]mm · 4 of 39 slices shown (1 of 6)]
[im 1/39]
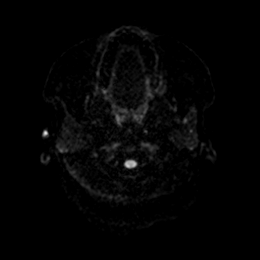
[im 13/39]
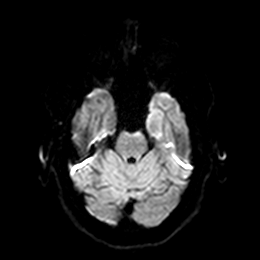
[im 26/39]
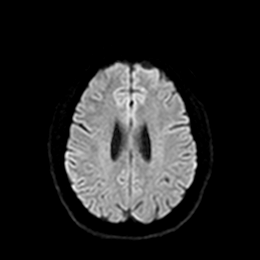
[im 39/39]
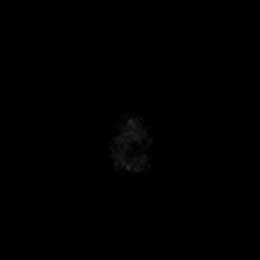

[Series 5: DWI · axial · 4.0mm · 0.88mm/px · z∈[-84,+66]mm · 4 of 39 slices shown (2 of 6)]
[im 1/39]
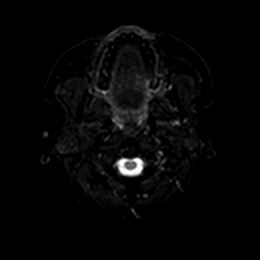
[im 13/39]
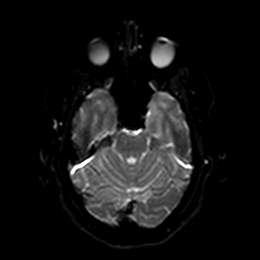
[im 26/39]
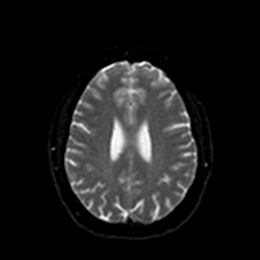
[im 39/39]
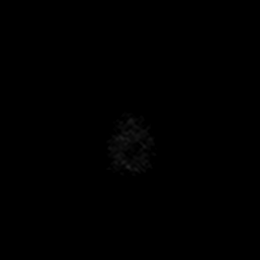

[Series 6: DWI · axial · 4.0mm · 0.88mm/px · z∈[-84,+66]mm · 3 of 39 slices shown (3 of 6)]
[im 1/39]
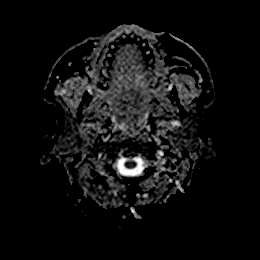
[im 20/39]
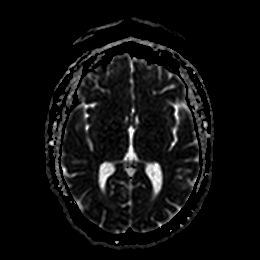
[im 39/39]
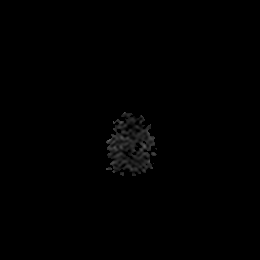

[Series 7: DWI · coronal · 5.0mm · 0.88mm/px · 2 of 29 slices shown (4 of 6)]
[im 1/29]
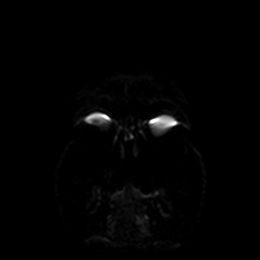
[im 29/29]
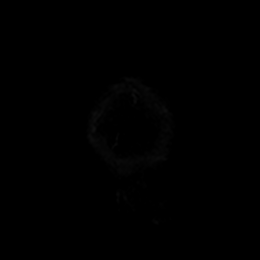

[Series 7: DWI · coronal · 5.0mm · 0.88mm/px · 2 of 29 slices shown (5 of 6)]
[im 1/29]
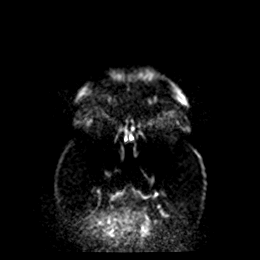
[im 29/29]
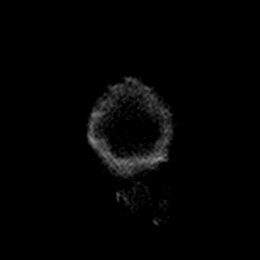

[Series 8: DWI · coronal · 5.0mm · 0.88mm/px · 2 of 29 slices shown (6 of 6)]
[im 1/29]
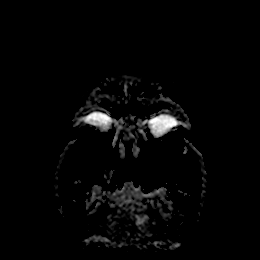
[im 29/29]
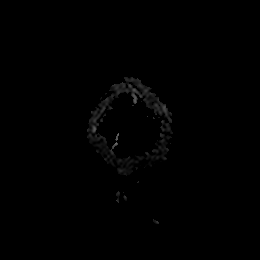

[Series 9: T1 · sagittal · 5.0mm · 0.94mm/px · 2 of 21 slices shown]
[im 1/21]
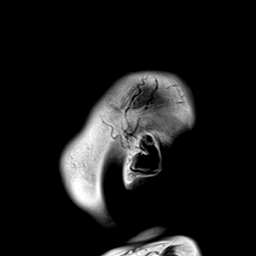
[im 21/21]
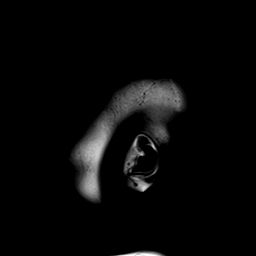

[Series 10: T2 · axial · 5.0mm · 0.72mm/px · z∈[-82,+64]mm · 2 of 22 slices shown (1 of 2)]
[im 1/22]
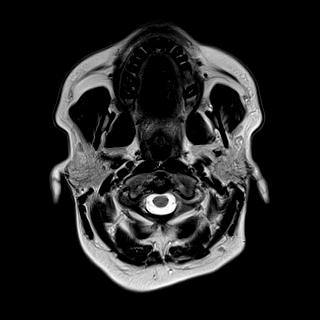
[im 22/22]
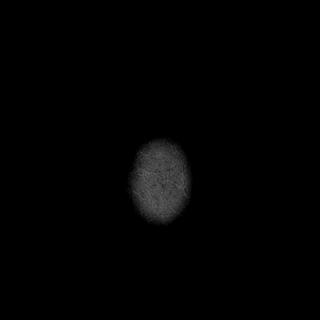

[Series 11: ax hemo · axial · 5.0mm · 0.86mm/px · z∈[-78,+65]mm · 2 of 25 slices shown]
[im 1/25]
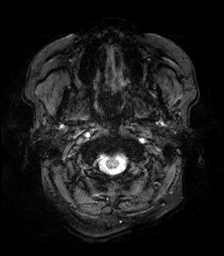
[im 25/25]
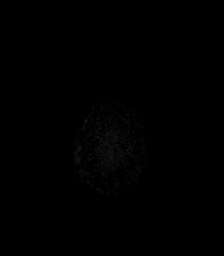

[Series 12: FLAIR · axial · 4.0mm · 0.43mm/px · z∈[-78,+65]mm · 3 of 37 slices shown]
[im 1/37]
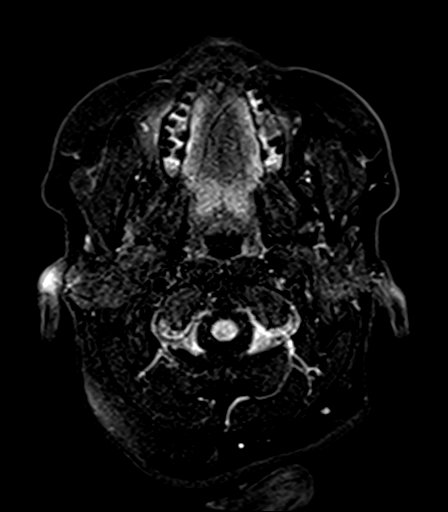
[im 19/37]
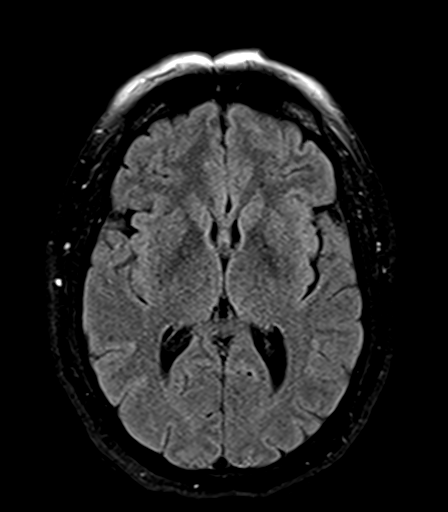
[im 37/37]
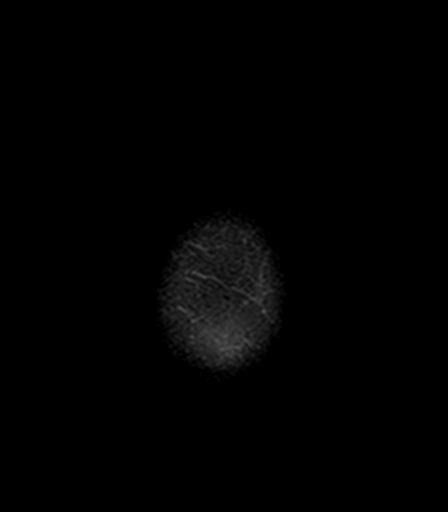

[Series 19: T2 · coronal · 5.0mm · 0.72mm/px · 2 of 28 slices shown (2 of 2)]
[im 1/28]
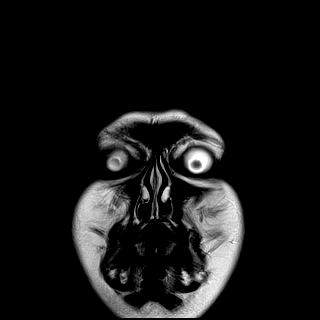
[im 28/28]
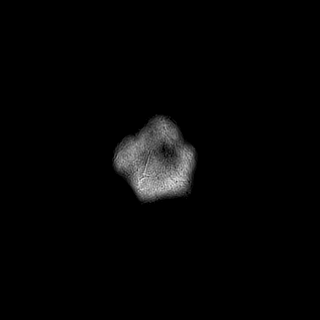

[28 of 48 positions shown; findings below may reference images not displayed]

FINDINGS: MRI HEAD

Brain: There is no acute infarction or intracranial hemorrhage.
There is no intracranial mass, mass effect, or edema. There is no
hydrocephalus or extra-axial fluid collection. Ventricles and sulci
are normal in size and configuration.

Vascular: Major vessel flow voids at the skull base are preserved.

Skull and upper cervical spine: Normal marrow signal is preserved.

Sinuses/Orbits: Paranasal sinuses are aerated. Orbits are
unremarkable.

Other: Sella is unremarkable.  Mastoid air cells are clear.

MRA HEAD

Degraded by motion. Intracranial internal carotid arteries are
patent. Proximal middle and anterior cerebral arteries are patent.
Intracranial vertebral arteries, basilar artery, proximal posterior
cerebral arteries are patent. Likely fetal origin of the left PCA.

MRA NECK

Motion artifact is present. Great vessel origins are patent. No
high-grade proximal subclavian stenosis. Common, internal, and
external carotid arteries are patent. Extracranial vertebral
arteries are patent. No hemodynamically significant stenosis
identified.
IMPRESSION: No acute infarction, hemorrhage, or mass.

No large vessel occlusion, hemodynamically significant stenosis, or
evidence dissection.

## 2021-12-07 MED ORDER — ENOXAPARIN SODIUM 40 MG/0.4ML IJ SOSY
40.0000 mg | PREFILLED_SYRINGE | INTRAMUSCULAR | Status: DC
  Administered 2021-12-07 – 2021-12-08 (×2): 40 mg via SUBCUTANEOUS
  Filled 2021-12-07 (×2): qty 0.4

## 2021-12-07 MED ORDER — TENECTEPLASE FOR STROKE
PACK | INTRAVENOUS | Status: AC
  Filled 2021-12-07: qty 10

## 2021-12-07 MED ORDER — POTASSIUM CHLORIDE IN NACL 40-0.9 MEQ/L-% IV SOLN: INTRAVENOUS | Status: DC

## 2021-12-07 MED ORDER — PROMETHAZINE HCL 12.5 MG PO TABS
12.5000 mg | ORAL_TABLET | Freq: Four times a day (QID) | ORAL | Status: DC | PRN
  Administered 2021-12-08: 12.5 mg via ORAL
  Filled 2021-12-07: qty 1

## 2021-12-07 MED ORDER — GADOBUTROL 1 MMOL/ML IV SOLN
10.0000 mL | Freq: Once | INTRAVENOUS | Status: AC | PRN
Start: 2021-12-07 — End: 2021-12-07
  Administered 2021-12-07: 10 mL via INTRAVENOUS

## 2021-12-07 MED ORDER — ONDANSETRON HCL 4 MG/2ML IJ SOLN
4.0000 mg | Freq: Once | INTRAMUSCULAR | Status: AC
  Administered 2021-12-07: 4 mg via INTRAVENOUS

## 2021-12-07 MED ORDER — DIAZEPAM 5 MG/ML IJ SOLN
5.0000 mg | Freq: Once | INTRAMUSCULAR | Status: AC
  Administered 2021-12-07: 5 mg via INTRAVENOUS
  Filled 2021-12-07: qty 2

## 2021-12-07 MED ORDER — LORAZEPAM 2 MG/ML IJ SOLN
INTRAMUSCULAR | Status: AC
  Filled 2021-12-07: qty 1

## 2021-12-07 MED ORDER — MECLIZINE HCL 12.5 MG PO TABS: 25.0000 mg | ORAL_TABLET | Freq: Once | ORAL | Status: DC | PRN

## 2021-12-07 MED ORDER — ONDANSETRON HCL 4 MG/2ML IJ SOLN
INTRAMUSCULAR | Status: AC
  Filled 2021-12-07: qty 2

## 2021-12-07 MED ORDER — LORAZEPAM 2 MG/ML IJ SOLN
1.0000 mg | Freq: Once | INTRAMUSCULAR | Status: AC
  Administered 2021-12-07: 1 mg via INTRAVENOUS
  Filled 2021-12-07: qty 1

## 2021-12-07 MED ORDER — POTASSIUM CHLORIDE CRYS ER 20 MEQ PO TBCR
40.0000 meq | EXTENDED_RELEASE_TABLET | Freq: Once | ORAL | Status: AC
  Administered 2021-12-07: 40 meq via ORAL
  Filled 2021-12-07: qty 2

## 2021-12-07 MED ORDER — CHLORHEXIDINE GLUCONATE CLOTH 2 % EX PADS
6.0000 | MEDICATED_PAD | Freq: Every day | CUTANEOUS | Status: DC
  Administered 2021-12-07: 6 via TOPICAL

## 2021-12-07 MED ORDER — ACETAMINOPHEN 325 MG PO TABS
650.0000 mg | ORAL_TABLET | Freq: Four times a day (QID) | ORAL | Status: DC | PRN
  Administered 2021-12-08 (×3): 650 mg via ORAL
  Filled 2021-12-07 (×3): qty 2

## 2021-12-07 MED ORDER — POLYETHYLENE GLYCOL 3350 17 G PO PACK: 17.0000 g | PACK | Freq: Every day | ORAL | Status: DC | PRN

## 2021-12-07 MED ORDER — ACETAMINOPHEN 650 MG RE SUPP: 650.0000 mg | Freq: Four times a day (QID) | RECTAL | Status: DC | PRN

## 2021-12-07 MED ORDER — HALOPERIDOL LACTATE 5 MG/ML IJ SOLN
5.0000 mg | Freq: Once | INTRAMUSCULAR | Status: AC
Start: 2021-12-07 — End: 2021-12-07
  Administered 2021-12-07: 5 mg via INTRAVENOUS
  Filled 2021-12-07: qty 1

## 2021-12-07 MED ORDER — DIPHENHYDRAMINE HCL 50 MG/ML IJ SOLN
12.5000 mg | Freq: Once | INTRAMUSCULAR | Status: AC
Start: 2021-12-07 — End: 2021-12-07
  Administered 2021-12-07: 12.5 mg via INTRAVENOUS
  Filled 2021-12-07: qty 1

## 2021-12-07 MED ORDER — HALOPERIDOL LACTATE 5 MG/ML IJ SOLN
5.0000 mg | Freq: Once | INTRAMUSCULAR | Status: AC
  Administered 2021-12-07: 5 mg via INTRAVENOUS
  Filled 2021-12-07: qty 1

## 2021-12-07 MED ORDER — LACTATED RINGERS IV BOLUS
1000.0000 mL | Freq: Once | INTRAVENOUS | Status: AC
  Administered 2021-12-07: 1000 mL via INTRAVENOUS

## 2021-12-07 MED ORDER — ONDANSETRON HCL 4 MG/2ML IJ SOLN: 4.0000 mg | Freq: Once | INTRAMUSCULAR | Status: DC

## 2021-12-07 NOTE — ED Notes (Signed)
Patient complaining of severe dizziness/anxiety. Noted to be crying. When asked what her name is she states "[id number]." Will not answer other questions.Friend with patient states this was how patient was acting while at office and would make statements that made no sense. MD made aware with new orders received.

## 2021-12-07 NOTE — Assessment & Plan Note (Addendum)
Started on Vyvanse 2 weeks ago, has been on same dose for the past 2 weeks.  Was on same drug about 3 years ago for about a year, tolerated well. -Hold Vyvanse with acute encephalopathy which may be related to Vyvanse

## 2021-12-07 NOTE — ED Notes (Signed)
Attempted to In/Out cath for urine sample. During cath process patient became quiet and unresponsive. Patient sternal rubbed and responsive at this time. Patient yelling "No I cannot." EDP at bedside.

## 2021-12-07 NOTE — ED Notes (Signed)
Patient is calm and resting at this time. Respirations even and unlabored.

## 2021-12-07 NOTE — ED Provider Notes (Addendum)
[facility] EMERGENCY DEPARTMENT Provider Note   CSN: [id number] Arrival date & time: 12/07/21  3300  An emergency department physician performed an initial assessment on this suspected stroke patient at 0854.  History  Chief Complaint  Patient presents with   Code Stroke    Paula King is a 41 y.o. female.  HPI Patient presents for strokelike symptoms.  She works as a Psychologist, sport and exercise.  She was in her normal state of health going to work this morning.  Patient's coworkers noticed a acute change at 8 AM.  They noticed left-sided facial droop and patient had difficulty with word finding.  Patient was sent to the ED.  On arrival, she continues to have a difficult time with speech.  She is unable to fully provide history.  She does state that she also noticed a change that occurred at 8 AM.  She denies any areas of pain at this time.  She does feel numbness throughout her left hemibody, including face.    Home Medications Prior to Admission medications   Medication Sig Start Date End Date Taking? Authorizing Provider  Calcium Carb-Cholecalciferol (CALCIUM 600+D3 PO) Take 1 tablet by mouth 3 (three) times daily.   Yes [provider]  cholecalciferol (VITAMIN D) 1000 units tablet Take 1,000 Units by mouth.  12/26/17  Yes [provider]  Cyanocobalamin (B-12) 1000 MCG TABS Take 1,000 mcg by mouth daily.  12/26/17  Yes [provider]  Multiple Vitamins-Minerals (BARIATRIC MULTIVITAMINS/IRON PO) Take 1 tablet by mouth daily.   Yes [provider]  VYVANSE 10 MG capsule Take 10 mg by mouth daily. 12/01/21  Yes [provider]  megestrol (MEGACE) 40 MG tablet Take 3 x 5 days hen 2 x 5 days then 1 daily till bleeding has stopped Patient not taking: Reported on 12/07/2021 09/16/21   Estill Dooms, NP      Allergies    Ativan [lorazepam], Valium [diazepam], and Ciprofloxacin    Review of Systems   Review of Systems  Neurological:  Positive  for facial asymmetry, speech difficulty and numbness.  Psychiatric/Behavioral:  Positive for confusion.   All other systems reviewed and are negative.   Physical Exam Updated Vital Signs BP 115/79   Pulse 91   Temp 99.8 F (37.7 C) (Axillary)   Resp 13   Ht '5\' 4"'$  (1.626 m)   Wt 97.3 kg   SpO2 97%   BMI 36.82 kg/m  Physical Exam Vitals and nursing note reviewed.  Constitutional:      General: She is not in acute distress.    Appearance: Normal appearance. She is well-developed. She is not ill-appearing, toxic-appearing or diaphoretic.  HENT:     Head: Normocephalic and atraumatic.     Right Ear: External ear normal.     Left Ear: External ear normal.     Nose: Nose normal.     Mouth/Throat:     Mouth: Mucous membranes are moist.     Pharynx: Oropharynx is clear.  Eyes:     Extraocular Movements: Extraocular movements intact.     Conjunctiva/sclera: Conjunctivae normal.  Cardiovascular:     Rate and Rhythm: Normal rate and regular rhythm.     Heart sounds: No murmur heard. Pulmonary:     Effort: Pulmonary effort is normal. No respiratory distress.  Abdominal:     General: There is no distension.     Palpations: Abdomen is soft.     Tenderness: There is no abdominal tenderness.  Musculoskeletal:  General: No swelling or tenderness. Normal range of motion.     Cervical back: Normal range of motion and neck supple. No rigidity.     Right lower leg: No edema.     Left lower leg: No edema.  Skin:    General: Skin is warm and dry.     Capillary Refill: Capillary refill takes less than 2 seconds.     Coloration: Skin is not jaundiced or pale.  Neurological:     Mental Status: She is alert. She is disoriented.     Cranial Nerves: No dysarthria or facial asymmetry.     Sensory: Sensory deficit (Diminished sensation to left hemibody and face) present.     Motor: Motor function is intact. No weakness, abnormal muscle tone or pronator drift.     Comments: Difficulty  with word finding.  Difficulty following some commands.  Presentation concerning for expressive and receptive aphasia.  Psychiatric:        Mood and Affect: Mood normal.     ED Results / Procedures / Treatments   Labs (all labs ordered are listed, but only abnormal results are displayed) Labs Reviewed  CBC - Abnormal; Notable for the following components:      Result Value   RBC 5.21 (*)    Hemoglobin 16.2 (*)    HCT 47.8 (*)    All other components within normal limits  COMPREHENSIVE METABOLIC PANEL - Abnormal; Notable for the following components:   Potassium 3.1 (*)    Glucose, Bld 108 (*)    BUN 21 (*)    Creatinine, Ser 1.01 (*)    Total Protein 8.5 (*)    All other components within normal limits  CBG MONITORING, ED - Abnormal; Notable for the following components:   Glucose-Capillary 108 (*)    All other components within normal limits  RESP PANEL BY RT-PCR (FLU A&B, COVID) ARPGX2  ETHANOL  PROTIME-INR  APTT  DIFFERENTIAL  MAGNESIUM  TSH  RAPID URINE DRUG SCREEN, HOSP PERFORMED  URINALYSIS, ROUTINE W REFLEX MICROSCOPIC  CK  CBG MONITORING, ED  POC URINE PREG, ED    EKG None  Radiology MR BRAIN WO CONTRAST  Result Date: 12/07/2021 CLINICAL DATA:  Neuro deficit, acute, stroke suspected EXAM: MRI HEAD WITHOUT CONTRAST MRA HEAD WITHOUT CONTRAST MRA NECK WITHOUT AND WITH CONTRAST TECHNIQUE: Multiplanar, multi-echo pulse sequences of the brain and surrounding structures were acquired without intravenous contrast. Angiographic images of the Circle of Willis were acquired using MRA technique without intravenous contrast. Angiographic images of the neck were acquired using MRA technique without and with intravenous contrast. Carotid stenosis measurements (when applicable) are obtained utilizing NASCET criteria, using the distal internal carotid diameter as the denominator. CONTRAST:  53m GADAVIST GADOBUTROL 1 MMOL/ML IV SOLN COMPARISON:  None Available. FINDINGS: MRI HEAD  Brain: There is no acute infarction or intracranial hemorrhage. There is no intracranial mass, mass effect, or edema. There is no hydrocephalus or extra-axial fluid collection. Ventricles and sulci are normal in size and configuration. Vascular: Major vessel flow voids at the skull base are preserved. Skull and upper cervical spine: Normal marrow signal is preserved. Sinuses/Orbits: Paranasal sinuses are aerated. Orbits are unremarkable. Other: Sella is unremarkable.  Mastoid air cells are clear. MRA HEAD Degraded by motion. Intracranial internal carotid arteries are patent. Proximal middle and anterior cerebral arteries are patent. Intracranial vertebral arteries, basilar artery, proximal posterior cerebral arteries are patent. Likely fetal origin of the left PCA. MRA NECK Motion artifact is present. Great vessel  origins are patent. No high-grade proximal subclavian stenosis. Common, internal, and external carotid arteries are patent. Extracranial vertebral arteries are patent. No hemodynamically significant stenosis identified. IMPRESSION: No acute infarction, hemorrhage, or mass. No large vessel occlusion, hemodynamically significant stenosis, or evidence dissection. Electronically Signed   By: Macy Mis M.D.   On: 12/07/2021 11:03   MR ANGIO HEAD WO CONTRAST  Result Date: 12/07/2021 CLINICAL DATA:  Neuro deficit, acute, stroke suspected EXAM: MRI HEAD WITHOUT CONTRAST MRA HEAD WITHOUT CONTRAST MRA NECK WITHOUT AND WITH CONTRAST TECHNIQUE: Multiplanar, multi-echo pulse sequences of the brain and surrounding structures were acquired without intravenous contrast. Angiographic images of the Circle of Willis were acquired using MRA technique without intravenous contrast. Angiographic images of the neck were acquired using MRA technique without and with intravenous contrast. Carotid stenosis measurements (when applicable) are obtained utilizing NASCET criteria, using the distal internal carotid diameter as  the denominator. CONTRAST:  60m GADAVIST GADOBUTROL 1 MMOL/ML IV SOLN COMPARISON:  None Available. FINDINGS: MRI HEAD Brain: There is no acute infarction or intracranial hemorrhage. There is no intracranial mass, mass effect, or edema. There is no hydrocephalus or extra-axial fluid collection. Ventricles and sulci are normal in size and configuration. Vascular: Major vessel flow voids at the skull base are preserved. Skull and upper cervical spine: Normal marrow signal is preserved. Sinuses/Orbits: Paranasal sinuses are aerated. Orbits are unremarkable. Other: Sella is unremarkable.  Mastoid air cells are clear. MRA HEAD Degraded by motion. Intracranial internal carotid arteries are patent. Proximal middle and anterior cerebral arteries are patent. Intracranial vertebral arteries, basilar artery, proximal posterior cerebral arteries are patent. Likely fetal origin of the left PCA. MRA NECK Motion artifact is present. Great vessel origins are patent. No high-grade proximal subclavian stenosis. Common, internal, and external carotid arteries are patent. Extracranial vertebral arteries are patent. No hemodynamically significant stenosis identified. IMPRESSION: No acute infarction, hemorrhage, or mass. No large vessel occlusion, hemodynamically significant stenosis, or evidence dissection. Electronically Signed   By: PMacy MisM.D.   On: 12/07/2021 11:03   MR ANGIO NECK W WO CONTRAST  Result Date: 12/07/2021 CLINICAL DATA:  Neuro deficit, acute, stroke suspected EXAM: MRI HEAD WITHOUT CONTRAST MRA HEAD WITHOUT CONTRAST MRA NECK WITHOUT AND WITH CONTRAST TECHNIQUE: Multiplanar, multi-echo pulse sequences of the brain and surrounding structures were acquired without intravenous contrast. Angiographic images of the Circle of Willis were acquired using MRA technique without intravenous contrast. Angiographic images of the neck were acquired using MRA technique without and with intravenous contrast. Carotid  stenosis measurements (when applicable) are obtained utilizing NASCET criteria, using the distal internal carotid diameter as the denominator. CONTRAST:  172mGADAVIST GADOBUTROL 1 MMOL/ML IV SOLN COMPARISON:  None Available. FINDINGS: MRI HEAD Brain: There is no acute infarction or intracranial hemorrhage. There is no intracranial mass, mass effect, or edema. There is no hydrocephalus or extra-axial fluid collection. Ventricles and sulci are normal in size and configuration. Vascular: Major vessel flow voids at the skull base are preserved. Skull and upper cervical spine: Normal marrow signal is preserved. Sinuses/Orbits: Paranasal sinuses are aerated. Orbits are unremarkable. Other: Sella is unremarkable.  Mastoid air cells are clear. MRA HEAD Degraded by motion. Intracranial internal carotid arteries are patent. Proximal middle and anterior cerebral arteries are patent. Intracranial vertebral arteries, basilar artery, proximal posterior cerebral arteries are patent. Likely fetal origin of the left PCA. MRA NECK Motion artifact is present. Great vessel origins are patent. No high-grade proximal subclavian stenosis. Common, internal, and external carotid arteries are  patent. Extracranial vertebral arteries are patent. No hemodynamically significant stenosis identified. IMPRESSION: No acute infarction, hemorrhage, or mass. No large vessel occlusion, hemodynamically significant stenosis, or evidence dissection. Electronically Signed   By: Macy Mis M.D.   On: 12/07/2021 11:03   CT HEAD CODE STROKE WO CONTRAST  Result Date: 12/07/2021 CLINICAL DATA:  Code stroke. Aphasia. Left-sided weakness. Facial droop which has resolved. EXAM: CT HEAD WITHOUT CONTRAST TECHNIQUE: Contiguous axial images were obtained from the base of the skull through the vertex without intravenous contrast. RADIATION DOSE REDUCTION: This exam was performed according to the departmental dose-optimization program which includes automated  exposure control, adjustment of the mA and/or kV according to patient size and/or use of iterative reconstruction technique. COMPARISON:  None Available. FINDINGS: Brain: No acute infarct, hemorrhage, or mass lesion is present. No significant white matter lesions are present. Basal ganglia are intact. No acute or focal cortical abnormality is present. The ventricles are of normal size. No significant extraaxial fluid collection is present. The brainstem and cerebellum are within normal limits. Vascular: Flow is present in the major intracranial arteries. Skull: Calvarium is intact. No focal lytic or blastic lesions are present. Sinuses/Orbits: Calvarium is intact. No focal lytic or blastic lesions are present. Other: ASPECTS ([place] Stroke Program Early CT Score) - Ganglionic level infarction (caudate, lentiform nuclei, internal capsule, insula, M1-M3 cortex): 7/7 - Supraganglionic infarction (M4-M6 cortex): 3/3 Total score (0-10 with 10 being normal): 10/10 IMPRESSION: Negative CT of the head. Aspects 10/10 These results were called by telephone at the time of interpretation on 12/07/2021 at 9:25 am to provider [facility] , who verbally acknowledged these results. Final report was delayed due to a delay in information to the PACS. Electronically Signed   By: San Morelle M.D.   On: 12/07/2021 10:14    Procedures Procedures    Medications Ordered in ED Medications  ondansetron (ZOFRAN) injection 4 mg (4 mg Intravenous Not Given 12/07/21 1111)  meclizine (ANTIVERT) tablet 25 mg (has no administration in time range)  ondansetron (ZOFRAN) 4 MG/2ML injection (  Not Given 12/07/21 1125)  LORazepam (ATIVAN) 2 MG/ML injection (  Not Given 12/07/21 1311)  lactated ringers bolus 1,000 mL (0 mLs Intravenous Stopped 12/07/21 1128)  potassium chloride SA (KLOR-CON M) CR tablet 40 mEq (40 mEq Oral Given 12/07/21 1025)  diazepam (VALIUM) injection 5 mg (5 mg Intravenous Given 12/07/21 1036)  gadobutrol (GADAVIST)  1 MMOL/ML injection 10 mL (10 mLs Intravenous Contrast Given 12/07/21 1048)  ondansetron (ZOFRAN) injection 4 mg (4 mg Intravenous Given 12/07/21 1111)  LORazepam (ATIVAN) injection 1 mg (1 mg Intravenous Given 12/07/21 1147)  haloperidol lactate (HALDOL) injection 5 mg (5 mg Intravenous Given 12/07/21 1205)  diphenhydrAMINE (BENADRYL) injection 12.5 mg (12.5 mg Intravenous Given 12/07/21 1210)  haloperidol lactate (HALDOL) injection 5 mg (5 mg Intravenous Given 12/07/21 1244)    ED Course/ Medical Decision Making/ A&P                           Medical Decision Making Amount and/or Complexity of Data Reviewed Labs: ordered. Radiology: ordered.  Risk Prescription drug management. Decision regarding hospitalization.   This patient presents to the ED for concern of strokelike symptoms, this involves an extensive number of treatment options, and is a complaint that carries with it a high risk of complications and morbidity.  The differential diagnosis includes CVA, TIA, hypoglycemia, polypharmacy, anxiety   Co morbidities that complicate the patient evaluation  PE, GERD, Cushing syndrome, ADD, anxiety, depression, HTN.   Additional history obtained:  Additional history obtained from patient's coworker External records from outside source obtained and reviewed including EMR   Lab Tests:  I Ordered, and personally interpreted labs.  The pertinent results include: Hypokalemia with otherwise normal electrolytes, slight increase in creatinine from baseline, slight increase in hemoglobin from baseline, no leukocytosis   Imaging Studies ordered:  I ordered imaging studies including CT head, MRI brain, MRA head and neck I independently visualized and interpreted imaging which showed no acute findings I agree with the radiologist interpretation   Cardiac Monitoring: / EKG:  The patient was maintained on a cardiac monitor.  I personally viewed and interpreted the cardiac monitored which  showed an underlying rhythm of: Sinus rhythm, intermittent bigeminy and trigeminy   Consultations Obtained:  I requested consultation with the neurologist, Dr. Curly Shores, and discussed lab and imaging findings as well as pertinent plan - they recommend: No further stroke work-up based on negative brain imaging studies; MRI of cervical spine if patient continues to have left hemibody numbness after several hours of observation   Problem List / ED Course / Critical interventions / Medication management  Patient is a 41 year old female who presents for the cute onset of strokelike symptoms at 8 AM.  This occurred while at work.  It was witnessed by coworkers who noted a left-sided facial droop and patient appearing to have difficulty with speech.  Patient was brought directly to the emergency department.  On arrival, I do not appreciate any facial asymmetry.  She does have difficulty describing events prior to arrival.  She is unable to identify basic objects such as pen and stethoscope.  She has a difficult time following basic commands.  Presentation is concerning for expressive and receptive aphasia.  Additionally, patient endorses diminished sensation to her left hemibody and face.  She does not have any areas of weakness.  Given the acute timeline, code stroke was called.  Patient underwent CT of head as well as MRI brain and MRA of head and neck.  Results of the studies were negative for acute stroke.  Neurologist reviewed these images and recommends no further stroke work-up based on negative brain imaging studies; MRI of cervical spine if patient continues to have left hemibody numbness after several hours of observation.  While in the ED, patient endorsed dizziness and nausea.  Valium was given for symptomatic relief.  Shortly thereafter, patient became agitated with worsening confusion.  At this time, patient's sister, who is highly medically literate, is at bedside.  Patient's sister confirms that  patient has no history of similar presentations in the past.  Patient was given Haldol for acute agitation.  After this, she slept for approximately 10 to 15 minutes.  When she woke, she had continued agitation and did require an additional dose of Haldol.  She also required restraints.  Patient was subsequently able to rest.  When she awoke, restraints were removed.  She was able to remain calm and was comfortable sitting in recliner at bedside.  I had a shared decision-making discussion with the patient's sister.  Patient's lab work is all very reassuring.  It is unclear what caused her symptoms earlier this morning.  It is also unclear what caused her agitation in the ED, although it could have been a paradoxical reaction to the Valium.  Lumbar puncture was offered, however, patient's sister declined.  I feel that this is reasonable given absence of leukocytosis and no  meningismus.  Given her persistent encephalopathy, patient was admitted to hospitalist for further management. I ordered medication including IVF for hydration; potassium chloride for hypokalemia; Valium for dizziness and nausea; Haldol for agitation Reevaluation of the patient after these medicines showed that the patient improved I have reviewed the patients home medicines and have made adjustments as needed   Social Determinants of Health:  Lives independently   Test / Admission - Considered:  Considered lumbar puncture for encephalopathy of unknown cause.  I have low suspicion for bacterial meningitis given absence of leukocytosis and absence of any meningismal signs.  Patient's sister, who is medically literate, is in agreement.  CRITICAL CARE Performed by: Godfrey Pick   Total critical care time: 65 minutes  Critical care time was exclusive of separately billable procedures and treating other patients.  Critical care was necessary to treat or prevent imminent or life-threatening deterioration.  Critical care was time  spent personally by me on the following activities: development of treatment plan with patient and/or surrogate as well as nursing, discussions with consultants, evaluation of patient's response to treatment, examination of patient, obtaining history from patient or surrogate, ordering and performing treatments and interventions, ordering and review of laboratory studies, ordering and review of radiographic studies, pulse oximetry and re-evaluation of patient's condition.         Final Clinical Impression(s) / ED Diagnoses Final diagnoses:  Encephalopathy  Stroke-like symptoms    Rx / DC Orders ED Discharge Orders     None             Godfrey Pick, MD 12/07/21 1743

## 2021-12-07 NOTE — Consult Note (Signed)
[facility] Telemedicine Consult   Requesting Provider: Godfrey Pick Consult Participants: Myself, atrium nurse Estill Bamberg, bedside nurse Threasa Beards, sister at bedside [facility] NP) Location of the provider: [facility] Location of the patient: [place]  This consult was provided via telemedicine with 2-way video and audio communication. The patient/family was informed that care would be provided in this way and agreed to receive care in this manner.    Chief Complaint: left face, arm and leg numbness, dizziness, confusion   HPI: TANICKA BISAILLON is a 41 y.o. with a past medical history significant for bariatric surgery, anxiety/depression, provoked pulmonary embolus in the setting of COVID (remote, no longer on anticoagulation), 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however on chart review 12/09/2017 Dr. Dorris Fetch note her laboratory examinations were not consistent with Cushing's), ADD on stimulants (recently restarted Vyvanse), sinus tachycardia (no longer on beta-blockade), obesity s/p gastric bypass surgery 2 years ago.  History is slightly challenging as the patient is slow to respond and perseverates, not always answering direct question and stating "I am so confused."  From what I am able to gather she woke up in her normal self at 6 AM.  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  Family at bedside is concerned also for some left eye ptosis and left facial droop not appreciable on my telemetry video examination.  They note that she was her normal self at least at 6:30 AM when she dropped off her daughter with family member.    LKW: 8 AM tpa given?: No, MRI negative IR Thrombectomy? No, no LVO Modified Rankin Scale: 0-Completely asymptomatic and back to baseline post- stroke Time of teleneurologist evaluation: 9:12 AM  Exam: Vitals:   12/07/21 0908  BP: (!) 140/98  Pulse: (!) 114  Resp: (!) 21   SpO2: 100%    General: Mildly anxious but otherwise no acute distress Pulmonary: breathing comfortably Cardiac: regular rate but tachycardic on monitor; ECG sinus tachycardia  NIH Stroke scale 1A: Level of Consciousness - 0 1B: Ask Month and Age - 0 1C: 'Blink Eyes' & 'Squeeze Hands' - 0 2: Test Horizontal Extraocular Movements - 0 3: Test Visual Fields - 0 4: Test Facial Palsy - 0 5A: Test Left Arm Motor Drift - 0 5B: Test Right Arm Motor Drift - 0 6A: Test Left Leg Motor Drift - 1 6B: Test Right Leg Motor Drift - 1 7: Test Limb Ataxia - 0 8: Test Sensation - 1 9: Test Language/Aphasia- 1 slow to answer questions, cannot name hammock or glove, does not describe cookie picture (a simply stares at it), but fairly fluent speech when discussing her symptoms 10: Test Dysarthria - 0 11: Test Extinction/Inattention - 0 NIHSS score: 4   Imaging Reviewed:  Head CT personally reviewed, agree with radiology no evidence for acute intracranial process MRI brain personally reviewed, no evidence of acute infarct  Labs reviewed in epic and pertinent values follow:  Basic Metabolic Panel: Recent Labs  Lab 12/07/21 0905  NA 138  K 3.1*  CL 105  CO2 26  GLUCOSE 108*  BUN 21*  CREATININE 1.01*  CALCIUM 9.7  Baseline 0.8 Cr; BUN 14 Hypokalemia, hyperphosphotemia (4.9)  CBC: Recent Labs  Lab 12/07/21 0905  WBC 10.1  NEUTROABS 6.3  HGB 16.2*  HCT 47.8*  MCV 91.7  PLT 291    Coagulation Studies: Recent Labs  12/07/21 0905  LABPROT 13.0  INR 1.0     ECG reviewed, sinus tachycardia, borderline LVH  Assessment: This is a 41 year old woman with minimal vascular risk factors other than obesity, presenting with dizziness/confusion and some left-sided sensory symptoms.  Stroke ruled out with MRI.  Labs notable for some electrolyte derangements including hypokalemia, hypophosphatemia, acute kidney injury, uremia.  Suspect toxic metabolic process contributing to her symptoms,  potential functional overlay of left-sided sensory symptoms.  Code stroke recommendations, completed: MRI brain (a diffusion reviewed, remainder of the images still pending at the time of this note) MRA head and neck to rule out dissection (images still pending at the time of this note)  Additional recommendations:  Fluid resuscitation and management of electrolyte derangements and work-up and treatment of underlying etiology of the same per ED/primary team If her left-sided sensory symptoms are not improving in the next 4 hours, MRI cervical spine to rule out acute compressive process Plan discussed with family at bedside and all their questions were answered I will follow-up MRI brain and MRA head and neck but please reach out to neurology if additional questions or concerns arise  Lesleigh Noe MD-PhD [facility] [phone]   If 8pm-8am, please page neurology on call as listed in [place].  CRITICAL CARE Performed by: Lorenza Chick   Total critical care time: 60 minutes  Critical care time was exclusive of separately billable procedures and treating other patients.  Critical care was necessary to treat or prevent imminent or life-threatening deterioration, evaluation of acute neurological changes with potential to treat with thrombolytic or intra-arterial thrombectomy  Critical care was time spent personally by me on the following activities: development of treatment plan with patient and/or surrogate as well as nursing, discussions with consultants, evaluation of patient's response to treatment, examination of patient, obtaining history from patient or surrogate, ordering and performing treatments and interventions, ordering and review of laboratory studies, ordering and review of radiographic studies, pulse oximetry and re-evaluation of patient's condition.

## 2021-12-07 NOTE — ED Notes (Signed)
Patient becoming combative, increasingly confused. All monitoring equipment pulled off, trying to pull at IV. Will not cooperate, follow commands even by sister in room at this time. Noted with intermittent left sided facial droop. Patient out of bed to toilet where she proceeds to sit down and refuses to get up. MD called to room to evaluate patient. Aware of intermittent droop and witness to behavior in bathroom. States that he will order IV meds.

## 2021-12-07 NOTE — ED Notes (Addendum)
Patient has been given Ativan, agitation and combativeness have worsened. Patient is attempting to bite staff, hit. Unable to be re-directed. Patient taken from chair to bed at this time. MD has been notified of need for restraints, medication. Monitoring equipment has been pulled off by patient.

## 2021-12-07 NOTE — Assessment & Plan Note (Addendum)
Metabolic versus toxic.   Initial reports of left facial droop, left-sided numbness and possibly weakness, aphasia-resolved at the time of my evaluation.  Confusion dizziness also present significantly worsened after Valium and Ativan given in ED.  Now sedated with Benadryl and haloperidol.  Head CT, MRI brain MRA head and neck unrevealing as to etiology, no acute infarct or hemorrhage.  Restarted on Vyvanse 2 weeks ago-concern for serotonin syndrome or some other neurologic or psychiatric adverse effects related to Vyvanse, presentation/symptoms worsened by benzodiazepines given in the ED.  Would not explain transient neurologic deficits that have resolved. Tmax of 99.8.  No focal infection identified.  Neck is supple.  EKG showing ventricular trigeminy. -Evaluated by Tele-neurology, suspect toxic metabolic process, potential functional overlay of left-sided sensory symptoms.  Recommended checking MRI cervical spine to rule out acute compressive process if left-sided symptoms do not improve. -Check CK -1 L bolus given, cont N/s 100cc/hr x 1 day -Avoid benzodiazepines, -Hold Vyvanse -Repeat EKG in the morning -  UDS -Blood alcohol level unremarkable, sister denies regular use

## 2021-12-07 NOTE — ED Notes (Signed)
Noted with change in cardiac rhythm on monitor. EKG performed and given to MD.

## 2021-12-07 NOTE — ED Notes (Signed)
Patient got out of violent wrist restraints. Patient becoming aggressive at staff, throwing self against bed. Violent wrist restraints back in place. Patient is disoriented.

## 2021-12-07 NOTE — ED Notes (Signed)
Noted with change in cardiac rhythm, EKG performed and given to MD.

## 2021-12-07 NOTE — ED Notes (Signed)
Patient unable to give urine sample. When patient asked if she wants to stay out of restraints patient states yes. When patient asked if she wants to lay back in bed patient states no.  Patient offered recliner chair in order to keep cardiac monitoring and pulse ox in place. Patient educated on requirements to stay out of restraints which include keeping cardiac monitoring, pulse ox, and IV in place. Patient verbally agreed to these requirements. Sister at bedside verbalizes understanding of requirements. EDP made aware.

## 2021-12-07 NOTE — ED Notes (Signed)
Assumed care of this patient at 13:13. Patient is restless and attempting to roll around in bed. Stimulus decreased by turning off lights, turning off TV, and blanket provided. Patient is diaphoretic upon assessment. Oral temp 99.1.

## 2021-12-07 NOTE — Plan of Care (Signed)
  Problem: Safety: Goal: Violent Restraint(s) Outcome: Progressing

## 2021-12-07 NOTE — Progress Notes (Signed)
Code stroke activated at 0907. Patient was already seen by provider and taken to CT prior to cart activation. Returned to ED at 0910. Dr. Curly [place] connected to telestroke cart for code stroke evaluation at [phone]. Patient taken to MRI at 0929.

## 2021-12-07 NOTE — ED Notes (Signed)
Per MD, place patient in bilateral wrist and ankle restraints. Patient fighting 6-7 staff members at this time and is able to pull wrists from soft restraints. Unable to be re-directed. Yelling out and screaming. Attempting to bite and kick staff. Family at bedside and assisting with patient.

## 2021-12-07 NOTE — ED Notes (Signed)
Patient remains calm and cooperative in bedside recliner.

## 2021-12-07 NOTE — H&P (Signed)
History and Physical    Paula King WCH:[id number] DOB: 1980/07/04 DOA: 12/07/2021  PCP: Celene Squibb, MD   Patient coming from: Home  I have personally briefly reviewed patient's old medical records in [place]  Chief Complaint:   HPI: Paula King is a 41 y.o. female with medical history significant for  HTN, PE in the setting of COVID. Patient was brought to the ED with initial come plaints of left facial droop, difficulty finding words, inability to move left arm and left leg and numbness, dizziness s and confusion.  Patient was at work at about 8 AM when these symptoms started. At the time of my evaluation, patient is sedated, sleeping, moving all extremities, barely able to answer questions, otherwise history was obtained from sister at bedside. Patient was in her usual state of health until this morning.  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 Patient's sister at bedside, who has been a PA for about 12 years, also noted left facial droop, word finding difficulty and inability to move her left side, the symptoms have resolved.  Patient has been able to talk in ED but speech is confused.  There has been no vomiting no diarrhea.  There might have been some rigidity noticed in the ED. ED provider did not note facial droop.  Was initially confused, she was given 5 mg of Valium, and Ativan 1 mg, and a paradoxical reaction, patient became extremely more confused, agitated, hallucinating, combative and diaphoretic.  States that bedside reports this has never happened before.  She denies any illicit drugs, and drinks alcohol very rarely/on social occasions.  Patient was started on Vyvanse 2 weeks ago for ADD, she has been on the same dose for the past 2 weeks. About 3 years ago at least, patient was on Vyvanse for several months , up to a year sister is not sure exactly.  She tolerated it well.  ED Course: Tmax 99.8.  Heart rate  ranging mostly from 80s to 121.  Respiratory rate 10-28.  Blood pressure systolic 353-614.  O2 sats greater than 95% on room air. Initial concern for stroke, stroke was called, teleneurology was consulted, head CT without acute abnormality, MRI brain, MRA head and neck-motion degraded without acute findings. 1 L bolus given. Ativan and Valium given with worsening of agitation, patient improved with 5 mg of haloperidol. EG showed sinus rhythm with ventricular bigeminy. Hospitalist admit for acute encephalopathy.  Review of Systems: Limited due to patient's altered mental status.  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[place])    Diverticulitis    GERD (gastroesophageal reflux disease)    Hypertension    Miscarriage    Obese    Pneumonia    Preeclampsia    Pulmonary embolism ([place])    Vaginal Pap smear, abnormal     Past Surgical History:  Procedure Laterality Date   CESAREAN SECTION     CHOLECYSTECTOMY     LAPAROSCOPIC GASTRIC SLEEVE RESECTION N/A 02/05/2020   Procedure: LAPAROSCOPIC SLEEVE GASTRECTOMY;  Surgeon: Clovis Riley, MD;  Location: [facility];  Service: General;  Laterality: N/A;   NASAL SEPTOPLASTY W/ TURBINOPLASTY N/A 07/27/2019   Procedure: NASAL SEPTOPLASTY WITH TURBINATE REDUCTION;  Surgeon: Leta Baptist, MD;  Location: [facility];  Service: ENT;  Laterality: N/A;   UPPER GI ENDOSCOPY N/A 02/05/2020   Procedure: UPPER  GI ENDOSCOPY;  Surgeon: Clovis Riley, MD;  Location: [facility];  Service: General;  Laterality: N/A;   WISDOM TOOTH EXTRACTION       reports that she has never smoked. She has never used smokeless tobacco. She reports current alcohol use. She reports that she does not use drugs.  Allergies  Allergen Reactions   Ativan [Lorazepam] Other (See Comments)    Acute delirium.   Valium [Diazepam] Other (See Comments)    Acute delirium.    Ciprofloxacin     IV Only-Patient started  sweating and becoming clammy when was infused with IV cipro on 08/09/14. Can tolerate PO Ciprofloxacin     Family History  Problem Relation Age of Onset   Hypertension Father    Diabetes Father    Anxiety disorder Mother    Depression Mother    Cancer Sister        throat    Hypertension Paternal Grandmother    Hypertension Maternal Grandmother    Asthma Daughter    Cancer Maternal Aunt        Breast cancer    Prior to Admission medications   Medication Sig Start Date End Date Taking? Authorizing Provider  Calcium Carb-Cholecalciferol (CALCIUM 600+D3 PO) Take 1 tablet by mouth 3 (three) times daily.   Yes [provider]  cholecalciferol (VITAMIN D) 1000 units tablet Take 1,000 Units by mouth.  12/26/17  Yes [provider]  Cyanocobalamin (B-12) 1000 MCG TABS Take 1,000 mcg by mouth daily.  12/26/17  Yes [provider]  Multiple Vitamins-Minerals (BARIATRIC MULTIVITAMINS/IRON PO) Take 1 tablet by mouth daily.   Yes [provider]  VYVANSE 10 MG capsule Take 10 mg by mouth daily. 12/01/21  Yes [provider]  megestrol (MEGACE) 40 MG tablet Take 3 x 5 days hen 2 x 5 days then 1 daily till bleeding has stopped Patient not taking: Reported on 12/07/2021 09/16/21   Estill Dooms, NP    Physical Exam: Limited due to patient's altered mental status Vitals:   12/07/21 1527 12/07/21 1530 12/07/21 1555 12/07/21 1600  BP:  117/79  (!) 124/95  Pulse: 87 88 99 (!) 101  Resp: '20 20 17 19  '$ Temp:      TempSrc:      SpO2: 98% 98% 99% 99%  Weight:      Height:        Constitutional: NAD, calm, comfortable Vitals:   12/07/21 1527 12/07/21 1530 12/07/21 1555 12/07/21 1600  BP:  117/79  (!) 124/95  Pulse: 87 88 99 (!) 101  Resp: '20 20 17 19  '$ Temp:      TempSrc:      SpO2: 98% 98% 99% 99%  Weight:      Height:       Eyes: PERRL, lids and conjunctivae normal ENMT: Mucous membranes are moist.  Neck: normal, supple, no masses, no  thyromegaly Respiratory: clear to auscultation bilaterally, no wheezing, no crackles. Normal respiratory effort. No accessory muscle use.  Cardiovascular: Regular rate and rhythm, no murmurs / rubs / gallops. No extremity edema. Lower extremities warm and well-perfused  abdomen: no tenderness, no masses palpated. No hepatosplenomegaly. Bowel sounds positive.  Musculoskeletal: no clubbing / cyanosis. No joint deformity upper and lower extremities. Good ROM, no contractures. Normal muscle tone.  Skin: no rashes, lesions, ulcers. No induration Neurologic: Limited exam due to altered mental status, moving all extremities spontaneously, verbalizing, sedated, but not directly answering questions.  No facial asymmetry. Psychiatric: Sedated.Marland Kitchen  Labs on Admission: I have personally reviewed following labs and imaging studies  CBC: Recent Labs  Lab 12/07/21 0905  WBC 10.1  NEUTROABS 6.3  HGB 16.2*  HCT 47.8*  MCV 91.7  PLT 401   Basic Metabolic Panel: Recent Labs  Lab 12/07/21 0905  NA 138  K 3.1*  CL 105  CO2 26  GLUCOSE 108*  BUN 21*  CREATININE 1.01*  CALCIUM 9.7  MG 2.1   GFR: Estimated Creatinine Clearance: 83 mL/min (A) (by C-G formula based on SCr of 1.01 mg/dL (H)). Liver Function Tests: Recent Labs  Lab 12/07/21 0905  AST 21  ALT 29  ALKPHOS 55  BILITOT 0.7  PROT 8.5*  ALBUMIN 4.6   Coagulation Profile: Recent Labs  Lab 12/07/21 0905  INR 1.0   CBG: Recent Labs  Lab 12/07/21 0857 12/07/21 1140  GLUCAP 99 108*   Thyroid Function Tests: Recent Labs    12/07/21 0905  TSH 1.635   Radiological Exams on Admission: MR BRAIN WO CONTRAST  Result Date: 12/07/2021 CLINICAL DATA:  Neuro deficit, acute, stroke suspected EXAM: MRI HEAD WITHOUT CONTRAST MRA HEAD WITHOUT CONTRAST MRA NECK WITHOUT AND WITH CONTRAST TECHNIQUE: Multiplanar, multi-echo pulse sequences of the brain and surrounding structures were acquired without intravenous contrast. Angiographic  images of the Circle of Willis were acquired using MRA technique without intravenous contrast. Angiographic images of the neck were acquired using MRA technique without and with intravenous contrast. Carotid stenosis measurements (when applicable) are obtained utilizing NASCET criteria, using the distal internal carotid diameter as the denominator. CONTRAST:  46m GADAVIST GADOBUTROL 1 MMOL/ML IV SOLN COMPARISON:  None Available. FINDINGS: MRI HEAD Brain: There is no acute infarction or intracranial hemorrhage. There is no intracranial mass, mass effect, or edema. There is no hydrocephalus or extra-axial fluid collection. Ventricles and sulci are normal in size and configuration. Vascular: Major vessel flow voids at the skull base are preserved. Skull and upper cervical spine: Normal marrow signal is preserved. Sinuses/Orbits: Paranasal sinuses are aerated. Orbits are unremarkable. Other: Sella is unremarkable.  Mastoid air cells are clear. MRA HEAD Degraded by motion. Intracranial internal carotid arteries are patent. Proximal middle and anterior cerebral arteries are patent. Intracranial vertebral arteries, basilar artery, proximal posterior cerebral arteries are patent. Likely fetal origin of the left PCA. MRA NECK Motion artifact is present. Great vessel origins are patent. No high-grade proximal subclavian stenosis. Common, internal, and external carotid arteries are patent. Extracranial vertebral arteries are patent. No hemodynamically significant stenosis identified. IMPRESSION: No acute infarction, hemorrhage, or mass. No large vessel occlusion, hemodynamically significant stenosis, or evidence dissection. Electronically Signed   By: PMacy MisM.D.   On: 12/07/2021 11:03   MR ANGIO HEAD WO CONTRAST  Result Date: 12/07/2021 CLINICAL DATA:  Neuro deficit, acute, stroke suspected EXAM: MRI HEAD WITHOUT CONTRAST MRA HEAD WITHOUT CONTRAST MRA NECK WITHOUT AND WITH CONTRAST TECHNIQUE: Multiplanar,  multi-echo pulse sequences of the brain and surrounding structures were acquired without intravenous contrast. Angiographic images of the Circle of Willis were acquired using MRA technique without intravenous contrast. Angiographic images of the neck were acquired using MRA technique without and with intravenous contrast. Carotid stenosis measurements (when applicable) are obtained utilizing NASCET criteria, using the distal internal carotid diameter as the denominator. CONTRAST:  158mGADAVIST GADOBUTROL 1 MMOL/ML IV SOLN COMPARISON:  None Available. FINDINGS: MRI HEAD Brain: There is no acute infarction or intracranial hemorrhage. There is no intracranial mass, mass effect, or edema. There is no hydrocephalus or extra-axial  fluid collection. Ventricles and sulci are normal in size and configuration. Vascular: Major vessel flow voids at the skull base are preserved. Skull and upper cervical spine: Normal marrow signal is preserved. Sinuses/Orbits: Paranasal sinuses are aerated. Orbits are unremarkable. Other: Sella is unremarkable.  Mastoid air cells are clear. MRA HEAD Degraded by motion. Intracranial internal carotid arteries are patent. Proximal middle and anterior cerebral arteries are patent. Intracranial vertebral arteries, basilar artery, proximal posterior cerebral arteries are patent. Likely fetal origin of the left PCA. MRA NECK Motion artifact is present. Great vessel origins are patent. No high-grade proximal subclavian stenosis. Common, internal, and external carotid arteries are patent. Extracranial vertebral arteries are patent. No hemodynamically significant stenosis identified. IMPRESSION: No acute infarction, hemorrhage, or mass. No large vessel occlusion, hemodynamically significant stenosis, or evidence dissection. Electronically Signed   By: Macy Mis M.D.   On: 12/07/2021 11:03   MR ANGIO NECK W WO CONTRAST  Result Date: 12/07/2021 CLINICAL DATA:  Neuro deficit, acute, stroke  suspected EXAM: MRI HEAD WITHOUT CONTRAST MRA HEAD WITHOUT CONTRAST MRA NECK WITHOUT AND WITH CONTRAST TECHNIQUE: Multiplanar, multi-echo pulse sequences of the brain and surrounding structures were acquired without intravenous contrast. Angiographic images of the Circle of Willis were acquired using MRA technique without intravenous contrast. Angiographic images of the neck were acquired using MRA technique without and with intravenous contrast. Carotid stenosis measurements (when applicable) are obtained utilizing NASCET criteria, using the distal internal carotid diameter as the denominator. CONTRAST:  44m GADAVIST GADOBUTROL 1 MMOL/ML IV SOLN COMPARISON:  None Available. FINDINGS: MRI HEAD Brain: There is no acute infarction or intracranial hemorrhage. There is no intracranial mass, mass effect, or edema. There is no hydrocephalus or extra-axial fluid collection. Ventricles and sulci are normal in size and configuration. Vascular: Major vessel flow voids at the skull base are preserved. Skull and upper cervical spine: Normal marrow signal is preserved. Sinuses/Orbits: Paranasal sinuses are aerated. Orbits are unremarkable. Other: Sella is unremarkable.  Mastoid air cells are clear. MRA HEAD Degraded by motion. Intracranial internal carotid arteries are patent. Proximal middle and anterior cerebral arteries are patent. Intracranial vertebral arteries, basilar artery, proximal posterior cerebral arteries are patent. Likely fetal origin of the left PCA. MRA NECK Motion artifact is present. Great vessel origins are patent. No high-grade proximal subclavian stenosis. Common, internal, and external carotid arteries are patent. Extracranial vertebral arteries are patent. No hemodynamically significant stenosis identified. IMPRESSION: No acute infarction, hemorrhage, or mass. No large vessel occlusion, hemodynamically significant stenosis, or evidence dissection. Electronically Signed   By: PMacy MisM.D.   On:  12/07/2021 11:03   CT HEAD CODE STROKE WO CONTRAST  Result Date: 12/07/2021 CLINICAL DATA:  Code stroke. Aphasia. Left-sided weakness. Facial droop which has resolved. EXAM: CT HEAD WITHOUT CONTRAST TECHNIQUE: Contiguous axial images were obtained from the base of the skull through the vertex without intravenous contrast. RADIATION DOSE REDUCTION: This exam was performed according to the departmental dose-optimization program which includes automated exposure control, adjustment of the mA and/or kV according to patient size and/or use of iterative reconstruction technique. COMPARISON:  None Available. FINDINGS: Brain: No acute infarct, hemorrhage, or mass lesion is present. No significant white matter lesions are present. Basal ganglia are intact. No acute or focal cortical abnormality is present. The ventricles are of normal size. No significant extraaxial fluid collection is present. The brainstem and cerebellum are within normal limits. Vascular: Flow is present in the major intracranial arteries. Skull: Calvarium is intact. No focal lytic or  blastic lesions are present. Sinuses/Orbits: Calvarium is intact. No focal lytic or blastic lesions are present. Other: ASPECTS ([place] Stroke Program Early CT Score) - Ganglionic level infarction (caudate, lentiform nuclei, internal capsule, insula, M1-M3 cortex): 7/7 - Supraganglionic infarction (M4-M6 cortex): 3/3 Total score (0-10 with 10 being normal): 10/10 IMPRESSION: Negative CT of the head. Aspects 10/10 These results were called by telephone at the time of interpretation on 12/07/2021 at 9:25 am to provider [facility] , who verbally acknowledged these results. Final report was delayed due to a delay in information to the PACS. Electronically Signed   By: San Morelle M.D.   On: 12/07/2021 10:14    EKG: Independently reviewed.  Sinus rhythm with ventricular trigeminy.  Rate 83.  QTc prolonged 509.  No significant ST or T wave changes.  Initial EKG  showed sinus tachycardia 116, QTc of 466.  Assessment/Plan Principal Problem:   Acute encephalopathy Active Problems:   ADD (attention deficit disorder)   Anxiety and depression   Acute metabolic encephalopathy   Assessment and Plan: * Acute encephalopathy Metabolic versus toxic.   Initial reports of left facial droop, left-sided numbness and possibly weakness, aphasia-resolved at the time of my evaluation.  Confusion dizziness also present significantly worsened after Valium and Ativan given in ED.  Now sedated with Benadryl and haloperidol.  Head CT, MRI brain MRA head and neck unrevealing as to etiology, no acute infarct or hemorrhage.  Restarted on Vyvanse 2 weeks ago-concern for serotonin syndrome or some other neurologic or psychiatric adverse effects related to Vyvanse, presentation/symptoms worsened by benzodiazepines given in the ED.  Would not explain transient neurologic deficits that have resolved. Tmax of 99.8.  No focal infection identified.  Neck is supple.  EKG showing ventricular trigeminy. -Evaluated by Tele-neurology, suspect toxic metabolic process, potential functional overlay of left-sided sensory symptoms.  Recommended checking MRI cervical spine to rule out acute compressive process if left-sided symptoms do not improve. -Check CK -1 L bolus given, cont N/s 100cc/hr x 1 day -Avoid benzodiazepines, -Hold Vyvanse -Repeat EKG in the morning -  UDS -Blood alcohol level unremarkable, sister denies regular use  ADD (attention deficit disorder) Started on Vyvanse 2 weeks ago, has been on same dose for the past 2 weeks.  Was on same drug about 3 years ago for about a year, tolerated well. -Hold Vyvanse with acute encephalopathy which may be related to Vyvanse    DVT prophylaxis: Lovenox Code Status: Full Family Communication:  Sister at bedside Disposition Plan: ~ 1 - 2 days Consults called: None Admission status:  Obs stepdown  Author: Bethena Roys,  MD 12/07/2021 6:45 PM  For on call review [email or web address].

## 2021-12-07 NOTE — ED Notes (Signed)
Neuro checks/VS cannot be obtained at this time due to MRI in progress.

## 2021-12-07 NOTE — ED Notes (Signed)
Returned from MRI at this time.

## 2021-12-07 NOTE — ED Notes (Addendum)
Patient ambulated with x2 assist to bathroom. Hat placed in toilet, no specimen collected at this time. Patient did void large amount. Patient continues to complain of nausea. Will speak to sister and state her name. Continues with confusion. Will not answer questions when asked by this nurse. Noted to be moving upper and lower extremities normally at this time. Symptoms/complaints have not changed since arrival. MD is aware of situation.

## 2021-12-07 NOTE — ED Notes (Signed)
Patient remains calm and resting with respirations even and unlabored.

## 2021-12-07 NOTE — ED Notes (Signed)
Patient remains calm and cooperative in recliner chair out of restraints with sister at bedside.

## 2021-12-07 NOTE — ED Notes (Signed)
EDP agrees with plan to keep patient out of restraints which includes decreased stimuli, sitter at bedside, family participation, and education.

## 2021-12-07 NOTE — ED Notes (Addendum)
Patient out of restraints and up to bedside commode with assistance. Attempting to give urine sample at this time.

## 2021-12-07 NOTE — ED Notes (Addendum)
Patient is screaming at staff and family at bedside. Patient is repeating incomprehensible statements. When staff asked if patient needed to use the restroom she says "yes." Patient provided with bedpan. Patient refusing to use bedpan and yelling "No I cannot." Patient offered purwick. Patient unable to follow commands in order to use purwick.

## 2021-12-07 NOTE — ED Notes (Signed)
Patient reports recently starting Vyvanse.

## 2021-12-07 NOTE — ED Notes (Signed)
Unable to complete neuro check/VS due to MRI in progress. RN remains with patient while in MRI.

## 2021-12-07 NOTE — ED Triage Notes (Signed)
Patient arrived to ED via POV after she was at work and developed aphasia, left facial tingling, left arm and leg numbness with dizziness that started at 0800. CBG on arrival was 99.

## 2021-12-07 NOTE — ED Notes (Signed)
Patient to MRI with RN at bedside at this time.

## 2021-12-08 ENCOUNTER — Inpatient Hospital Stay (HOSPITAL_COMMUNITY): Payer: Medicaid Other

## 2021-12-08 DIAGNOSIS — F419 Anxiety disorder, unspecified: Secondary | ICD-10-CM

## 2021-12-08 DIAGNOSIS — F32A Depression, unspecified: Secondary | ICD-10-CM

## 2021-12-08 DIAGNOSIS — G9340 Encephalopathy, unspecified: Secondary | ICD-10-CM | POA: Diagnosis not present

## 2021-12-08 DIAGNOSIS — Z803 Family history of malignant neoplasm of breast: Secondary | ICD-10-CM | POA: Diagnosis not present

## 2021-12-08 DIAGNOSIS — F909 Attention-deficit hyperactivity disorder, unspecified type: Secondary | ICD-10-CM | POA: Diagnosis not present

## 2021-12-08 DIAGNOSIS — Z8616 Personal history of COVID-19: Secondary | ICD-10-CM | POA: Diagnosis not present

## 2021-12-08 DIAGNOSIS — Z825 Family history of asthma and other chronic lower respiratory diseases: Secondary | ICD-10-CM | POA: Diagnosis not present

## 2021-12-08 DIAGNOSIS — E669 Obesity, unspecified: Secondary | ICD-10-CM | POA: Diagnosis present

## 2021-12-08 DIAGNOSIS — Z8249 Family history of ischemic heart disease and other diseases of the circulatory system: Secondary | ICD-10-CM | POA: Diagnosis not present

## 2021-12-08 DIAGNOSIS — Z833 Family history of diabetes mellitus: Secondary | ICD-10-CM | POA: Diagnosis not present

## 2021-12-08 DIAGNOSIS — I10 Essential (primary) hypertension: Secondary | ICD-10-CM | POA: Diagnosis present

## 2021-12-08 DIAGNOSIS — K219 Gastro-esophageal reflux disease without esophagitis: Secondary | ICD-10-CM | POA: Diagnosis present

## 2021-12-08 DIAGNOSIS — F988 Other specified behavioral and emotional disorders with onset usually occurring in childhood and adolescence: Secondary | ICD-10-CM | POA: Diagnosis present

## 2021-12-08 DIAGNOSIS — R2990 Unspecified symptoms and signs involving the nervous system: Secondary | ICD-10-CM

## 2021-12-08 DIAGNOSIS — Z9884 Bariatric surgery status: Secondary | ICD-10-CM | POA: Diagnosis not present

## 2021-12-08 DIAGNOSIS — T43625A Adverse effect of amphetamines, initial encounter: Secondary | ICD-10-CM | POA: Diagnosis present

## 2021-12-08 DIAGNOSIS — R29810 Facial weakness: Secondary | ICD-10-CM | POA: Diagnosis present

## 2021-12-08 DIAGNOSIS — G928 Other toxic encephalopathy: Secondary | ICD-10-CM | POA: Diagnosis present

## 2021-12-08 DIAGNOSIS — Z86711 Personal history of pulmonary embolism: Secondary | ICD-10-CM | POA: Diagnosis not present

## 2021-12-08 DIAGNOSIS — R4182 Altered mental status, unspecified: Secondary | ICD-10-CM | POA: Diagnosis present

## 2021-12-08 DIAGNOSIS — Z6836 Body mass index (BMI) 36.0-36.9, adult: Secondary | ICD-10-CM | POA: Diagnosis not present

## 2021-12-08 DIAGNOSIS — Z818 Family history of other mental and behavioral disorders: Secondary | ICD-10-CM | POA: Diagnosis not present

## 2021-12-08 DIAGNOSIS — G9341 Metabolic encephalopathy: Secondary | ICD-10-CM | POA: Diagnosis not present

## 2021-12-08 DIAGNOSIS — R748 Abnormal levels of other serum enzymes: Secondary | ICD-10-CM

## 2021-12-08 DIAGNOSIS — E876 Hypokalemia: Secondary | ICD-10-CM | POA: Diagnosis present

## 2021-12-08 LAB — BASIC METABOLIC PANEL
Anion gap: 7 (ref 5–15)
BUN: 14 mg/dL (ref 6–20)
CO2: 24 mmol/L (ref 22–32)
Calcium: 8.8 mg/dL — ABNORMAL LOW (ref 8.9–10.3)
Chloride: 107 mmol/L (ref 98–111)
Creatinine, Ser: 0.79 mg/dL (ref 0.44–1.00)
GFR, Estimated: >60 mL/min (ref >60)
Glucose, Bld: 99 mg/dL (ref 70–99)
Potassium: 3.5 mmol/L (ref 3.5–5.1)
Sodium: 138 mmol/L (ref 135–145)

## 2021-12-08 LAB — SEDIMENTATION RATE: Sed Rate: 27 mm/hr — ABNORMAL HIGH (ref 0–22)

## 2021-12-08 LAB — CBC
HCT: 40.8 % (ref 36.0–46.0)
Hemoglobin: 13.5 g/dL (ref 12.0–15.0)
MCH: 30.9 pg (ref 26.0–34.0)
MCHC: 33.1 g/dL (ref 30.0–36.0)
MCV: 93.4 fL (ref 80.0–100.0)
Platelets: 262 10*3/uL (ref 150–400)
RBC: 4.37 MIL/uL (ref 3.87–5.11)
RDW: 12.8 % (ref 11.5–15.5)
WBC: 13.6 10*3/uL — ABNORMAL HIGH (ref 4.0–10.5)
nRBC: 0 % (ref 0.0–0.2)

## 2021-12-08 LAB — VITAMIN B12: Vitamin B-12: 2100 pg/mL — ABNORMAL HIGH (ref 180–914)

## 2021-12-08 LAB — MAGNESIUM: Magnesium: 2 mg/dL (ref 1.7–2.4)

## 2021-12-08 LAB — CK: Total CK: 1395 U/L — ABNORMAL HIGH (ref 38–234)

## 2021-12-08 IMAGING — DX DG CHEST 1V PORT
1 series · 1 of 1 positions shown · non-contrast
Comparison: Chest radiograph [DATE]

CLINICAL DATA: Acute encephalopathy, leukocytosis, complaining of
headache and intermittent chest pain

EXAM:
PORTABLE CHEST 1 VIEW

[chest ap]
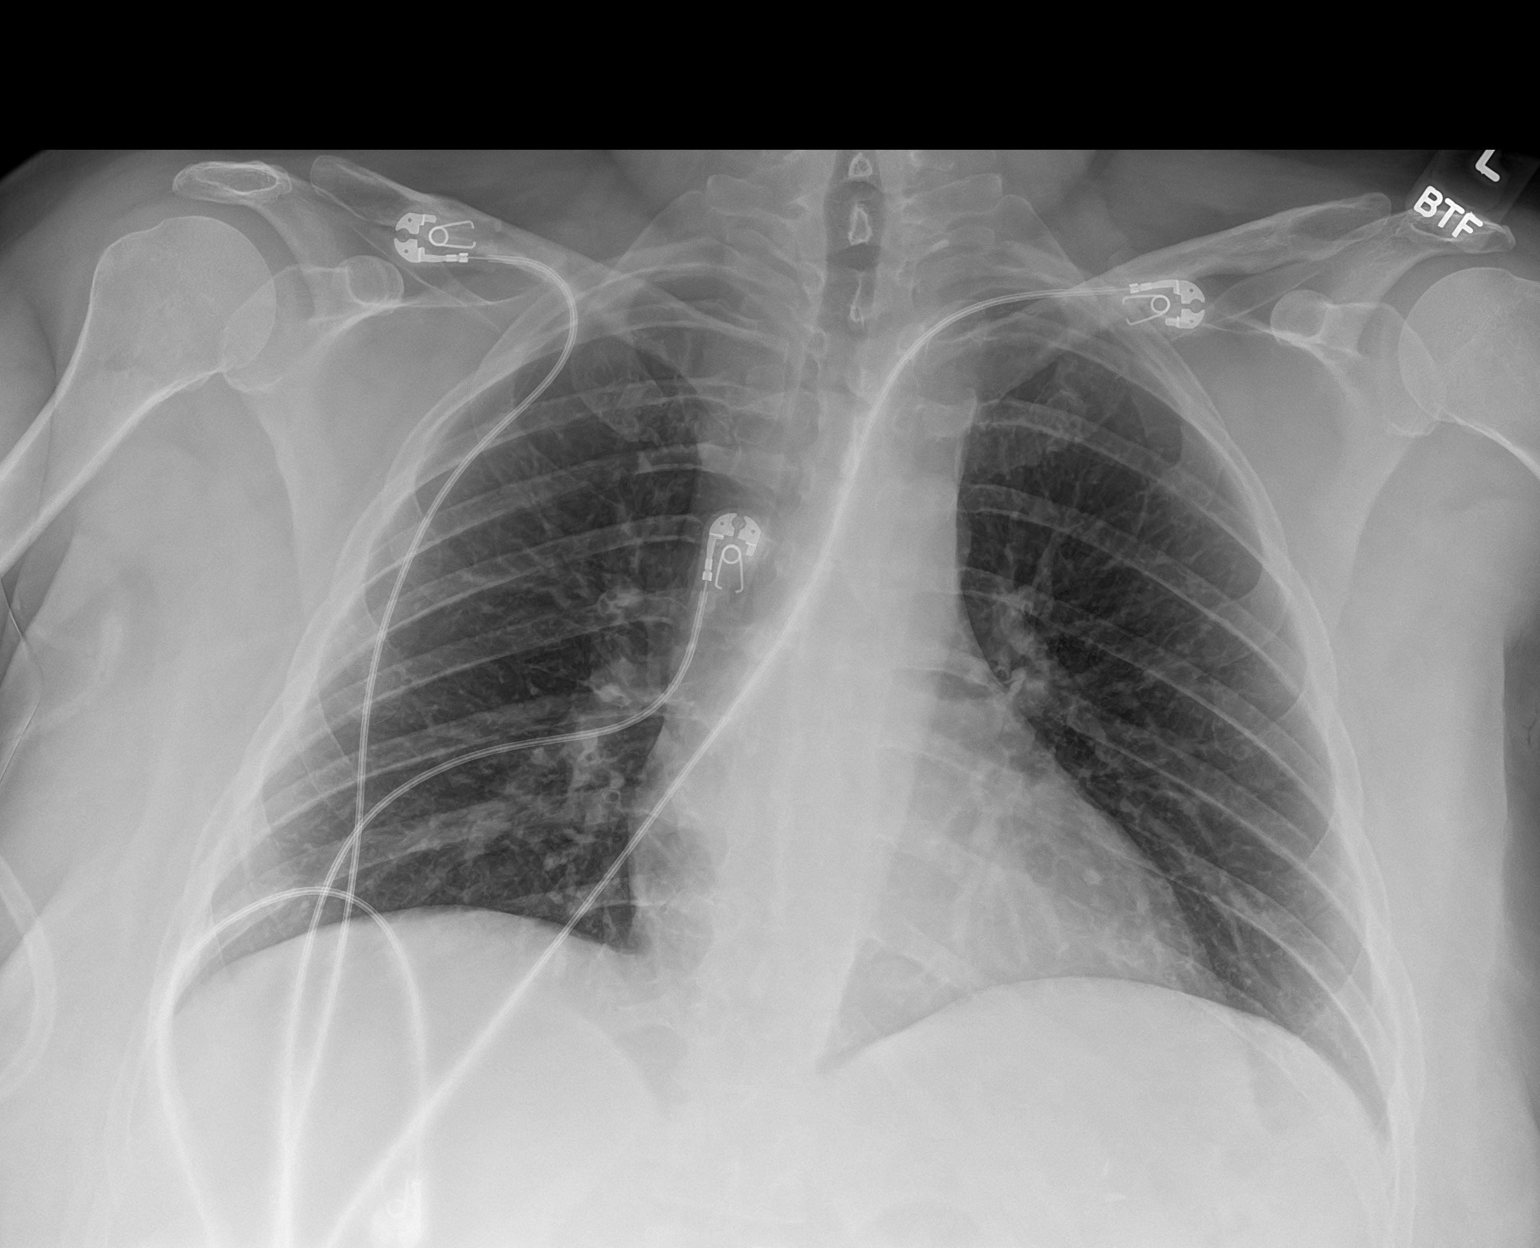

[1 of 1 positions shown; findings below may reference images not displayed]

FINDINGS: The cardiomediastinal silhouette is stable.

The lungs clear, with no focal consolidation or pulmonary edema.
There is no pleural effusion or pneumothorax

There is no acute osseous abnormality.
IMPRESSION: No radiographic evidence of acute cardiopulmonary process.

## 2021-12-08 MED ORDER — KETOROLAC TROMETHAMINE 15 MG/ML IJ SOLN
30.0000 mg | Freq: Once | INTRAMUSCULAR | Status: AC
  Administered 2021-12-08: 30 mg via INTRAVENOUS
  Filled 2021-12-08: qty 2

## 2021-12-08 MED ORDER — PROCHLORPERAZINE EDISYLATE 10 MG/2ML IJ SOLN
10.0000 mg | Freq: Once | INTRAMUSCULAR | Status: AC
Start: 2021-12-08 — End: 2021-12-08
  Administered 2021-12-08: 10 mg via INTRAVENOUS
  Filled 2021-12-08: qty 2

## 2021-12-08 MED ORDER — SUMATRIPTAN SUCCINATE 6 MG/0.5ML ~~LOC~~ SOLN
6.0000 mg | Freq: Once | SUBCUTANEOUS | Status: AC
  Administered 2021-12-08: 6 mg via SUBCUTANEOUS
  Filled 2021-12-08: qty 0.5

## 2021-12-08 MED ORDER — DEXAMETHASONE SODIUM PHOSPHATE 10 MG/ML IJ SOLN
10.0000 mg | Freq: Once | INTRAMUSCULAR | Status: AC
  Administered 2021-12-08: 10 mg via INTRAVENOUS
  Filled 2021-12-08: qty 1

## 2021-12-08 MED ORDER — DIPHENHYDRAMINE HCL 50 MG/ML IJ SOLN
25.0000 mg | Freq: Once | INTRAMUSCULAR | Status: AC
  Administered 2021-12-08: 25 mg via INTRAVENOUS
  Filled 2021-12-08: qty 1

## 2021-12-08 MED ORDER — POTASSIUM CHLORIDE IN NACL 40-0.9 MEQ/L-% IV SOLN: INTRAVENOUS | Status: AC

## 2021-12-08 MED ORDER — DEXTROSE 5 % IV SOLN
500.0000 mg | Freq: Once | INTRAVENOUS | Status: AC
  Administered 2021-12-08: 500 mg via INTRAVENOUS
  Filled 2021-12-08: qty 5

## 2021-12-08 NOTE — Progress Notes (Signed)
I connected with  Paula King on 12/08/21 by a video enabled telemedicine application and verified that I am speaking with the correct person using two identifiers.   I discussed the limitations of evaluation and management by telemedicine. The patient expressed understanding and agreed to proceed.  Location of patient: [facility] Location of physician: [facility]   Subjective: Patient continues to report left-sided headache, states has been going on for about 2 days.  States occasional migraines maybe once every 6 months in the past which resolved with Tylenol.  However this current headache is not improving after Tylenol.  States her left-sided numbness has completely resolved.  However continues to have intermittent speech difficulty.  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   ROS: negative except above  Examination  Vital signs in last 24 hours: Temp:  [97.5 F (36.4 C)-99.8 F (37.7 C)] 98.1 F (36.7 C) (06/13 1410) Pulse Rate:  [68-103] 80 (06/13 1410) Resp:  [10-28] 18 (06/13 1410) BP: (102-124)/(64-95) 113/77 (06/13 1410) SpO2:  [96 %-100 %] 98 % (06/13 1410) Weight:  [94.2 kg-95.4 kg] 95.4 kg (06/13 0500)  General: lying in bed, NAD Neuro: AOx3, cranial nerves II to XII grossly intact, antigravity strength in all 4 extremities without drift, FTN intact bilaterally   Basic Metabolic Panel: Recent Labs  Lab 12/07/21 0905 12/08/21 0358  NA 138 138  K 3.1* 3.5  CL 105 107  CO2 26 24  GLUCOSE 108* 99  BUN 21* 14  CREATININE 1.01* 0.79  CALCIUM 9.7 8.8*  MG 2.1 2.0    CBC: Recent Labs  Lab 12/07/21 0905 12/08/21 0358  WBC 10.1 13.6*  NEUTROABS 6.3  --   HGB 16.2* 13.5  HCT 47.8* 40.8  MCV 91.7 93.4  PLT 291 262     Coagulation Studies: Recent Labs    12/07/21 0905  LABPROT 13.0  INR 1.0    Imaging MRI brain without  contrast 12/07/2021: No acute infarction, hemorrhage or mass MRA head and neck with and without contrast 12/07/2021: No large vessel occlusion, hemodynamically significant stenosis, or evidence dissection.  ASSESSMENT AND PLAN: 41 year old female initially presented with left-sided numbness which has since resolved but continues to have headache and speech disturbance.  Transient neurological deficits Headache -Differentials include migraine with aura versus less likely autoimmune etiology versus functional  Recommendations -Will order migraine cocktail including Imitrex, Toradol, Compazine, dexamethasone and Depakote 500 mg once -If symptoms do not resolve, will order MRI brain with contrast to look for demyelinating disorders -Also discussed about potentially obtaining a lumbar puncture to look for autoimmune etiology.  Unlikely meningitis due to absence fever, normal mental status, no neck stiffness -Could also consider obtaining an EEG to look for potential epileptogenicity -Continue to hold Vyvanse as symptoms started recently and patient dates it has not helped as much as she expected. -Discussed plan with patient at bedside, sister at bedside as well as Dr. Wynetta Emery via secure chat  I have spent a total of  36  minutes with the patient reviewing hospital notes,  test results, labs and examining the patient as well as establishing an assessment and plan that was discussed personally with the patient.  > 50% of time was spent in direct patient care.    Zeb Comfort Epilepsy [facility] For questions after 5pm please refer to AMION to reach the Neurologist on call

## 2021-12-08 NOTE — Assessment & Plan Note (Signed)
--   she has been having intermittent bouts of confusion -- I have asked for neurology consultation to help with diagnosis and further work up

## 2021-12-08 NOTE — Assessment & Plan Note (Addendum)
--   possibly from the severe agitation earlier in the ED -- continue IV fluid and recheck CK in AM

## 2021-12-08 NOTE — TOC Progression Note (Signed)
Transition of Care [facility]) - Progression Note    Patient Details  Name: Paula King MRN: [id number] Date of Birth: 02/03/1981  Transition of Care [facility]) CM/SW Contact  Salome Arnt, [place] Phone Number: 12/08/2021, 10:00 AM  Clinical Narrative:    Transition of Care (TOC) Screening Note   Patient Details  Name: Paula King Date of Birth: 05/23/1981   Transition of Care (TOC) CM/SW Contact:    Salome Arnt, [place] Phone Number: 12/08/2021, 10:00 AM    Transition of Care Department [facility]) has reviewed patient and no TOC needs have been identified at this time. We will continue to monitor patient advancement through interdisciplinary progression rounds. If new patient transition needs arise, please place a TOC consult.        Barriers to Discharge: Continued Medical Work up  Expected Discharge Plan and Services                                                 Social Determinants of Health (SDOH) Interventions    Readmission Risk Interventions     No data to display

## 2021-12-08 NOTE — Progress Notes (Signed)
PROGRESS NOTE   Paula King  DGU:[id number] DOB: 27-Aug-1980 DOA: 12/07/2021 PCP: Celene Squibb, MD   Chief Complaint  Patient presents with   Code Stroke   Level of care: Telemetry  Brief Admission History:  41 y.o. female with medical history significant for  HTN, PE in the setting of COVID. Patient was brought to the ED with initial come plaints of left facial droop, difficulty finding words, inability to move left arm and left leg and numbness, dizziness s and confusion.  Patient was at work at about 8 AM when these symptoms started. At the time of my evaluation, patient is sedated, sleeping, moving all extremities, barely able to answer questions, otherwise history was obtained from sister at bedside. Patient was in her usual state of health until this morning.  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 Patient's sister at bedside, who has been a PA for about 12 years, also noted left facial droop, word finding difficulty and inability to move her left side, the symptoms have resolved.  Patient has been able to talk in ED but speech is confused.  There has been no vomiting no diarrhea.  There might have been some rigidity noticed in the ED. ED provider did not note facial droop.  Was initially confused, she was given 5 mg of Valium, and Ativan 1 mg, and a paradoxical reaction, patient became extremely more confused, agitated, hallucinating, combative and diaphoretic.  States that bedside reports this has never happened before.  She denies any illicit drugs, and drinks alcohol very rarely/on social occasions.  Patient was started on Vyvanse 2 weeks ago for ADD, she has been on the same dose for the past 2 weeks. About 3 years ago at least, patient was on Vyvanse for several months , up to a year sister is not sure exactly.  She tolerated it well.   ED Course: Tmax 99.8.  Heart rate ranging mostly from 80s to 121.  Respiratory rate 10-28.   Blood pressure systolic 956-387.  O2 sats greater than 95% on room air.  Initial concern for stroke, stroke was called, teleneurology was consulted, head CT without acute abnormality, MRI brain, MRA head and neck-motion degraded without acute findings.  1 L bolus given.  Ativan and Valium given with worsening of agitation, patient improved with 5 mg of haloperidol.  EG showed sinus rhythm with ventricular bigeminy.  Hospitalist admit for acute encephalopathy.   Assessment and Plan: * Acute metabolic encephalopathy -- she has been having intermittent bouts of confusion -- I have asked for neurology consultation to help with diagnosis and further work up  Elevated CK -- possibly from the severe agitation earlier in the ED -- continue IV fluid and recheck CK in AM   Anxiety and depression -- reportedly stable on no meds for this now   ADD (attention deficit disorder) Started on Vyvanse 2 weeks ago, has been on same dose for the past 2 weeks.  Was on same drug about 3 years ago for about a year, tolerated well. -Hold Vyvanse with acute encephalopathy which may be related to Vyvanse   DVT prophylaxis: enoxaparin Code Status: full  Family Communication:  Disposition: Status is: Inpatient Remains inpatient appropriate because: IV fluids for elevated CK levels    Consultants:  Neurology  Procedures:   Antimicrobials:    Subjective: Pt more alert this morning, she says she had a fine red rash on her trunk this  morning.  Pt reports headaches.  Objective: Vitals:   12/08/21 0741 12/08/21 0800 12/08/21 1136 12/08/21 1410  BP:  111/80  113/77  Pulse:  77  80  Resp:  15  18  Temp: 97.8 F (36.6 C)  99.3 F (37.4 C) 98.1 F (36.7 C)  TempSrc: Oral  Axillary Oral  SpO2:  100%  98%  Weight:      Height:        Intake/Output Summary (Last 24 hours) at 12/08/2021 1648 Last data filed at 12/08/2021 1606 Gross per 24 hour  Intake 1178.69 ml  Output --  Net 1178.69 ml   Filed Weights    12/07/21 0943 12/07/21 1849 12/08/21 0500  Weight: 97.3 kg 94.2 kg 95.4 kg   Examination:  General exam: Appears calm and comfortable  Respiratory system: Clear to auscultation. Respiratory effort normal. Cardiovascular system: normal S1 & S2 heard. No JVD, murmurs, rubs, gallops or clicks. No pedal edema. Gastrointestinal system: Abdomen is nondistended, soft and nontender. No organomegaly or masses felt. Normal bowel sounds heard. Central nervous system: Alert and oriented. No focal neurological deficits. Extremities: Symmetric 5 x 5 power. Skin: No rashes, lesions or ulcers. Psychiatry: Judgement and insight appear normal. Mood & affect appropriate.   Data Reviewed: I have personally reviewed following labs and imaging studies  CBC: Recent Labs  Lab 12/07/21 0905 12/08/21 0358  WBC 10.1 13.6*  NEUTROABS 6.3  --   HGB 16.2* 13.5  HCT 47.8* 40.8  MCV 91.7 93.4  PLT 291 161    Basic Metabolic Panel: Recent Labs  Lab 12/07/21 0905 12/08/21 0358  NA 138 138  K 3.1* 3.5  CL 105 107  CO2 26 24  GLUCOSE 108* 99  BUN 21* 14  CREATININE 1.01* 0.79  CALCIUM 9.7 8.8*  MG 2.1 2.0    CBG: Recent Labs  Lab 12/07/21 0857 12/07/21 1140  GLUCAP 99 108*    Recent Results (from the past 240 hour(s))  Resp Panel by RT-PCR (Flu A&B, Covid) Anterior Nasal Swab     Status: None   Collection Time: 12/07/21  9:06 AM   Specimen: Anterior Nasal Swab  Result Value Ref Range Status   SARS Coronavirus 2 by RT PCR NEGATIVE NEGATIVE Final    Comment: (NOTE) SARS-CoV-2 target nucleic acids are NOT DETECTED.  The SARS-CoV-2 RNA is generally detectable in upper respiratory specimens during the acute phase of infection. The lowest concentration of SARS-CoV-2 viral copies this assay can detect is 138 copies/mL. A negative result does not preclude SARS-Cov-2 infection and should not be used as the sole basis for treatment or other patient management decisions. A negative result may  occur with  improper specimen collection/handling, submission of specimen other than nasopharyngeal swab, presence of viral mutation(s) within the areas targeted by this assay, and inadequate number of viral copies(<138 copies/mL). A negative result must be combined with clinical observations, patient history, and epidemiological information. The expected result is Negative.  Fact Sheet for Patients:  EntrepreneurPulse.com.au  Fact Sheet for Healthcare Providers:  IncredibleEmployment.be  This test is no t yet approved or cleared by the [place] FDA and  has been authorized for detection and/or diagnosis of SARS-CoV-2 by FDA under an Emergency Use Authorization (EUA). This EUA will remain  in effect (meaning this test can be used) for the duration of the COVID-19 declaration under Section 564(b)(1) of the Act, 21 U.S.C.section 360bbb-3(b)(1), unless the authorization is terminated  or revoked sooner.  Influenza A by PCR NEGATIVE NEGATIVE Final   Influenza B by PCR NEGATIVE NEGATIVE Final    Comment: (NOTE) The Xpert Xpress SARS-CoV-2/FLU/RSV plus assay is intended as an aid in the diagnosis of influenza from Nasopharyngeal swab specimens and should not be used as a sole basis for treatment. Nasal washings and aspirates are unacceptable for Xpert Xpress SARS-CoV-2/FLU/RSV testing.  Fact Sheet for Patients: EntrepreneurPulse.com.au  Fact Sheet for Healthcare Providers: IncredibleEmployment.be  This test is not yet approved or cleared by the [place] FDA and has been authorized for detection and/or diagnosis of SARS-CoV-2 by FDA under an Emergency Use Authorization (EUA). This EUA will remain in effect (meaning this test can be used) for the duration of the COVID-19 declaration under Section 564(b)(1) of the Act, 21 U.S.C. section 360bbb-3(b)(1), unless the authorization is terminated  or revoked.  Performed at [facility], [address] [place], [place] [address]   MRSA Next Gen by PCR, Nasal     Status: None   Collection Time: 12/07/21  6:42 PM   Specimen: Nasal Mucosa; Nasal Swab  Result Value Ref Range Status   MRSA by PCR Next Gen NOT DETECTED NOT DETECTED Final    Comment: (NOTE) The GeneXpert MRSA Assay (FDA approved for NASAL specimens only), is one component of a comprehensive MRSA colonization surveillance program. It is not intended to diagnose MRSA infection nor to guide or monitor treatment for MRSA infections. Test performance is not FDA approved in patients less than 68 years old. Performed at [facility], [address] [place], [place] [address]      Radiology Studies: DG CHEST PORT 1 VIEW  Result Date: 12/08/2021 CLINICAL DATA:  Acute encephalopathy, leukocytosis, complaining of headache and intermittent chest pain EXAM: PORTABLE CHEST 1 VIEW COMPARISON:  Chest radiograph 11/12/2019 FINDINGS: The cardiomediastinal silhouette is stable. The lungs clear, with no focal consolidation or pulmonary edema. There is no pleural effusion or pneumothorax There is no acute osseous abnormality. IMPRESSION: No radiographic evidence of acute cardiopulmonary process. Electronically Signed   By: Valetta Mole M.D.   On: 12/08/2021 08:32   MR BRAIN WO CONTRAST  Result Date: 12/07/2021 CLINICAL DATA:  Neuro deficit, acute, stroke suspected EXAM: MRI HEAD WITHOUT CONTRAST MRA HEAD WITHOUT CONTRAST MRA NECK WITHOUT AND WITH CONTRAST TECHNIQUE: Multiplanar, multi-echo pulse sequences of the brain and surrounding structures were acquired without intravenous contrast. Angiographic images of the Circle of Willis were acquired using MRA technique without intravenous contrast. Angiographic images of the neck were acquired using MRA technique without and with intravenous contrast. Carotid stenosis measurements (when applicable) are obtained utilizing NASCET criteria, using the  distal internal carotid diameter as the denominator. CONTRAST:  22m GADAVIST GADOBUTROL 1 MMOL/ML IV SOLN COMPARISON:  None Available. FINDINGS: MRI HEAD Brain: There is no acute infarction or intracranial hemorrhage. There is no intracranial mass, mass effect, or edema. There is no hydrocephalus or extra-axial fluid collection. Ventricles and sulci are normal in size and configuration. Vascular: Major vessel flow voids at the skull base are preserved. Skull and upper cervical spine: Normal marrow signal is preserved. Sinuses/Orbits: Paranasal sinuses are aerated. Orbits are unremarkable. Other: Sella is unremarkable.  Mastoid air cells are clear. MRA HEAD Degraded by motion. Intracranial internal carotid arteries are patent. Proximal middle and anterior cerebral arteries are patent. Intracranial vertebral arteries, basilar artery, proximal posterior cerebral arteries are patent. Likely fetal origin of the left PCA. MRA NECK Motion artifact is present. Great vessel origins are patent. No high-grade proximal  subclavian stenosis. Common, internal, and external carotid arteries are patent. Extracranial vertebral arteries are patent. No hemodynamically significant stenosis identified. IMPRESSION: No acute infarction, hemorrhage, or mass. No large vessel occlusion, hemodynamically significant stenosis, or evidence dissection. Electronically Signed   By: Macy Mis M.D.   On: 12/07/2021 11:03   MR ANGIO HEAD WO CONTRAST  Result Date: 12/07/2021 CLINICAL DATA:  Neuro deficit, acute, stroke suspected EXAM: MRI HEAD WITHOUT CONTRAST MRA HEAD WITHOUT CONTRAST MRA NECK WITHOUT AND WITH CONTRAST TECHNIQUE: Multiplanar, multi-echo pulse sequences of the brain and surrounding structures were acquired without intravenous contrast. Angiographic images of the Circle of Willis were acquired using MRA technique without intravenous contrast. Angiographic images of the neck were acquired using MRA technique without and with  intravenous contrast. Carotid stenosis measurements (when applicable) are obtained utilizing NASCET criteria, using the distal internal carotid diameter as the denominator. CONTRAST:  22m GADAVIST GADOBUTROL 1 MMOL/ML IV SOLN COMPARISON:  None Available. FINDINGS: MRI HEAD Brain: There is no acute infarction or intracranial hemorrhage. There is no intracranial mass, mass effect, or edema. There is no hydrocephalus or extra-axial fluid collection. Ventricles and sulci are normal in size and configuration. Vascular: Major vessel flow voids at the skull base are preserved. Skull and upper cervical spine: Normal marrow signal is preserved. Sinuses/Orbits: Paranasal sinuses are aerated. Orbits are unremarkable. Other: Sella is unremarkable.  Mastoid air cells are clear. MRA HEAD Degraded by motion. Intracranial internal carotid arteries are patent. Proximal middle and anterior cerebral arteries are patent. Intracranial vertebral arteries, basilar artery, proximal posterior cerebral arteries are patent. Likely fetal origin of the left PCA. MRA NECK Motion artifact is present. Great vessel origins are patent. No high-grade proximal subclavian stenosis. Common, internal, and external carotid arteries are patent. Extracranial vertebral arteries are patent. No hemodynamically significant stenosis identified. IMPRESSION: No acute infarction, hemorrhage, or mass. No large vessel occlusion, hemodynamically significant stenosis, or evidence dissection. Electronically Signed   By: PMacy MisM.D.   On: 12/07/2021 11:03   MR ANGIO NECK W WO CONTRAST  Result Date: 12/07/2021 CLINICAL DATA:  Neuro deficit, acute, stroke suspected EXAM: MRI HEAD WITHOUT CONTRAST MRA HEAD WITHOUT CONTRAST MRA NECK WITHOUT AND WITH CONTRAST TECHNIQUE: Multiplanar, multi-echo pulse sequences of the brain and surrounding structures were acquired without intravenous contrast. Angiographic images of the Circle of Willis were acquired using MRA  technique without intravenous contrast. Angiographic images of the neck were acquired using MRA technique without and with intravenous contrast. Carotid stenosis measurements (when applicable) are obtained utilizing NASCET criteria, using the distal internal carotid diameter as the denominator. CONTRAST:  135mGADAVIST GADOBUTROL 1 MMOL/ML IV SOLN COMPARISON:  None Available. FINDINGS: MRI HEAD Brain: There is no acute infarction or intracranial hemorrhage. There is no intracranial mass, mass effect, or edema. There is no hydrocephalus or extra-axial fluid collection. Ventricles and sulci are normal in size and configuration. Vascular: Major vessel flow voids at the skull base are preserved. Skull and upper cervical spine: Normal marrow signal is preserved. Sinuses/Orbits: Paranasal sinuses are aerated. Orbits are unremarkable. Other: Sella is unremarkable.  Mastoid air cells are clear. MRA HEAD Degraded by motion. Intracranial internal carotid arteries are patent. Proximal middle and anterior cerebral arteries are patent. Intracranial vertebral arteries, basilar artery, proximal posterior cerebral arteries are patent. Likely fetal origin of the left PCA. MRA NECK Motion artifact is present. Great vessel origins are patent. No high-grade proximal subclavian stenosis. Common, internal, and external carotid arteries are patent. Extracranial vertebral arteries are patent.  No hemodynamically significant stenosis identified. IMPRESSION: No acute infarction, hemorrhage, or mass. No large vessel occlusion, hemodynamically significant stenosis, or evidence dissection. Electronically Signed   By: Macy Mis M.D.   On: 12/07/2021 11:03   CT HEAD CODE STROKE WO CONTRAST  Result Date: 12/07/2021 CLINICAL DATA:  Code stroke. Aphasia. Left-sided weakness. Facial droop which has resolved. EXAM: CT HEAD WITHOUT CONTRAST TECHNIQUE: Contiguous axial images were obtained from the base of the skull through the vertex without  intravenous contrast. RADIATION DOSE REDUCTION: This exam was performed according to the departmental dose-optimization program which includes automated exposure control, adjustment of the mA and/or kV according to patient size and/or use of iterative reconstruction technique. COMPARISON:  None Available. FINDINGS: Brain: No acute infarct, hemorrhage, or mass lesion is present. No significant white matter lesions are present. Basal ganglia are intact. No acute or focal cortical abnormality is present. The ventricles are of normal size. No significant extraaxial fluid collection is present. The brainstem and cerebellum are within normal limits. Vascular: Flow is present in the major intracranial arteries. Skull: Calvarium is intact. No focal lytic or blastic lesions are present. Sinuses/Orbits: Calvarium is intact. No focal lytic or blastic lesions are present. Other: ASPECTS ([place] Stroke Program Early CT Score) - Ganglionic level infarction (caudate, lentiform nuclei, internal capsule, insula, M1-M3 cortex): 7/7 - Supraganglionic infarction (M4-M6 cortex): 3/3 Total score (0-10 with 10 being normal): 10/10 IMPRESSION: Negative CT of the head. Aspects 10/10 These results were called by telephone at the time of interpretation on 12/07/2021 at 9:25 am to provider [facility] , who verbally acknowledged these results. Final report was delayed due to a delay in information to the PACS. Electronically Signed   By: San Morelle M.D.   On: 12/07/2021 10:14    Scheduled Meds:  Chlorhexidine Gluconate Cloth  6 each Topical Daily   diphenhydrAMINE  25 mg Intravenous Once   enoxaparin (LOVENOX) injection  40 mg Subcutaneous Q24H   Continuous Infusions:  0.9 % NaCl with KCl 40 mEq / L 145 mL/hr (12/08/21 1229)   valproate sodium 500 mg (12/08/21 1606)     LOS: 0 days   Time spent: 36 mins  Yaniel Limbaugh Wynetta Emery, MD How to contact the [facility] Attending or Consulting provider [place] or covering provider during  after hours [place], for this patient?  Check the care team in [facility] and look for a) attending/consulting [facility] provider listed and b) the [facility] team listed Log into [email or web address] and use [place]'s universal password to access. If you do not have the password, please contact the hospital operator. Locate the [facility] provider you are looking for under [facility] and page to a number that you can be directly reached. If you still have difficulty reaching the provider, please page the [facility] (Director on Call) for the Hospitalists listed on amion for assistance.  12/08/2021, 4:48 PM

## 2021-12-08 NOTE — Assessment & Plan Note (Signed)
--   reportedly stable on no meds for this now

## 2021-12-08 NOTE — Hospital Course (Addendum)
41 y.o. female with medical history significant for  HTN, PE in the setting of COVID. Patient was brought to the ED with initial come plaints of left facial droop, difficulty finding words, inability to move left arm and left leg and numbness, dizziness s and confusion.  Patient was at work at about 8 AM when these symptoms started. At the time of my evaluation, patient is sedated, sleeping, moving all extremities, barely able to answer questions, otherwise history was obtained from sister at bedside. Patient was in her usual state of health until this morning.  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 Patient's sister at bedside, who has been a PA for about 12 years, also noted left facial droop, word finding difficulty and inability to move her left side, the symptoms have resolved.  Patient has been able to talk in ED but speech is confused.  There has been no vomiting no diarrhea.  There might have been some rigidity noticed in the ED. ED provider did not note facial droop.  Was initially confused, she was given 5 mg of Valium, and Ativan 1 mg, and a paradoxical reaction, patient became extremely more confused, agitated, hallucinating, combative and diaphoretic.  States that bedside reports this has never happened before.  She denies any illicit drugs, and drinks alcohol very rarely/on social occasions.  Patient was started on Vyvanse 2 weeks ago for ADD, she has been on the same dose for the past 2 weeks. About 3 years ago at least, patient was on Vyvanse for several months , up to a year sister is not sure exactly.  She tolerated it well.   ED Course: Tmax 99.8.  Heart rate ranging mostly from 80s to 121.  Respiratory rate 10-28.  Blood pressure systolic 950-932.  O2 sats greater than 95% on room air.  Initial concern for stroke, stroke was called, teleneurology was consulted, head CT without acute abnormality, MRI brain, MRA head and neck-motion  degraded without acute findings.  1 L bolus given.  Ativan and Valium given with worsening of agitation, patient improved with 5 mg of haloperidol.  EG showed sinus rhythm with ventricular bigeminy.  Hospitalist admit for acute encephalopathy.

## 2021-12-09 DIAGNOSIS — G9341 Metabolic encephalopathy: Secondary | ICD-10-CM | POA: Diagnosis not present

## 2021-12-09 DIAGNOSIS — G9340 Encephalopathy, unspecified: Secondary | ICD-10-CM | POA: Diagnosis not present

## 2021-12-09 DIAGNOSIS — R2990 Unspecified symptoms and signs involving the nervous system: Secondary | ICD-10-CM | POA: Diagnosis not present

## 2021-12-09 LAB — CBC WITH DIFFERENTIAL/PLATELET
Abs Immature Granulocytes: 0.21 10*3/uL — ABNORMAL HIGH (ref 0.00–0.07)
Basophils Absolute: 0 10*3/uL (ref 0.0–0.1)
Basophils Relative: 0 %
Eosinophils Absolute: 0 10*3/uL (ref 0.0–0.5)
Eosinophils Relative: 0 %
HCT: 39.1 % (ref 36.0–46.0)
Hemoglobin: 13 g/dL (ref 12.0–15.0)
Immature Granulocytes: 2 %
Lymphocytes Relative: 15 %
Lymphs Abs: 1.6 10*3/uL (ref 0.7–4.0)
MCH: 31 pg (ref 26.0–34.0)
MCHC: 33.2 g/dL (ref 30.0–36.0)
MCV: 93.1 fL (ref 80.0–100.0)
Monocytes Absolute: 0.8 10*3/uL (ref 0.1–1.0)
Monocytes Relative: 8 %
Neutro Abs: 8.2 10*3/uL — ABNORMAL HIGH (ref 1.7–7.7)
Neutrophils Relative %: 75 %
Platelets: 241 10*3/uL (ref 150–400)
RBC: 4.2 MIL/uL (ref 3.87–5.11)
RDW: 13.2 % (ref 11.5–15.5)
WBC: 10.9 10*3/uL — ABNORMAL HIGH (ref 4.0–10.5)
nRBC: 0 % (ref 0.0–0.2)

## 2021-12-09 LAB — COMPREHENSIVE METABOLIC PANEL
ALT: 64 U/L — ABNORMAL HIGH (ref 0–44)
AST: 53 U/L — ABNORMAL HIGH (ref 15–41)
Albumin: 3.4 g/dL — ABNORMAL LOW (ref 3.5–5.0)
Alkaline Phosphatase: 47 U/L (ref 38–126)
Anion gap: 4 — ABNORMAL LOW (ref 5–15)
BUN: 15 mg/dL (ref 6–20)
CO2: 21 mmol/L — ABNORMAL LOW (ref 22–32)
Calcium: 8.5 mg/dL — ABNORMAL LOW (ref 8.9–10.3)
Chloride: 115 mmol/L — ABNORMAL HIGH (ref 98–111)
Creatinine, Ser: 0.77 mg/dL (ref 0.44–1.00)
GFR, Estimated: >60 mL/min (ref >60)
Glucose, Bld: 113 mg/dL — ABNORMAL HIGH (ref 70–99)
Potassium: 3.9 mmol/L (ref 3.5–5.1)
Sodium: 140 mmol/L (ref 135–145)
Total Bilirubin: 0.5 mg/dL (ref 0.3–1.2)
Total Protein: 6.4 g/dL — ABNORMAL LOW (ref 6.5–8.1)

## 2021-12-09 LAB — CK: Total CK: 750 U/L — ABNORMAL HIGH (ref 38–234)

## 2021-12-09 NOTE — Discharge Instructions (Signed)
Follow up with your Primary Care Provider [place] Crane, Edwinna Areola, MD ) for Recheck and Re-evaluation for adjustment of your ADD and depression medications

## 2021-12-09 NOTE — Discharge Summary (Signed)
Paula King, is a 41 y.o. female  DOB 08-Nov-1980  MRN [id number].  Admission date:  12/07/2021  Admitting Physician  Murlean Iba, MD  Discharge Date:  12/09/2021   Primary MD  Celene Squibb, MD  Recommendations for primary care physician for things to follow:   Follow up with your Primary Care Provider [place] Crane, Edwinna Areola, MD ) for Recheck and Re-evaluation for adjustment of your ADD and depression medications  Admission Diagnosis  Encephalopathy [G93.40] Acute encephalopathy [G93.40] Stroke-like symptoms [V56.43] Acute metabolic encephalopathy [P29.51]   Discharge Diagnosis  Encephalopathy [G93.40] Acute encephalopathy [G93.40] Stroke-like symptoms [O84.16] Acute metabolic encephalopathy [S06.30]    Principal Problem:   Acute metabolic encephalopathy Active Problems:   ADD (attention deficit disorder)   Anxiety and depression   Elevated CK   Encephalopathy   Stroke-like symptoms      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[place])    Diverticulitis    GERD (gastroesophageal reflux disease)    Hypertension    Miscarriage    Obese    Pneumonia    Preeclampsia    Pulmonary embolism ([place])    Vaginal Pap smear, abnormal     Past Surgical History:  Procedure Laterality Date   CESAREAN SECTION     CHOLECYSTECTOMY     LAPAROSCOPIC GASTRIC SLEEVE RESECTION N/A 02/05/2020   Procedure: LAPAROSCOPIC SLEEVE GASTRECTOMY;  Surgeon: Clovis Riley, MD;  Location: [facility];  Service: General;  Laterality: N/A;   NASAL SEPTOPLASTY W/ TURBINOPLASTY N/A 07/27/2019   Procedure: NASAL SEPTOPLASTY WITH TURBINATE REDUCTION;  Surgeon: Leta Baptist, MD;  Location: [facility];  Service: ENT;  Laterality: N/A;   UPPER GI ENDOSCOPY N/A 02/05/2020   Procedure: UPPER GI ENDOSCOPY;  Surgeon: Clovis Riley, MD;  Location: [facility];  Service:  General;  Laterality: N/A;   WISDOM TOOTH EXTRACTION       HPI  from the history and physical done on the day of admission:   Chief Complaint:    HPI: Paula King is a 41 y.o. female with medical history significant for  HTN, PE in the setting of COVID. Patient was brought to the ED with initial come plaints of left facial droop, difficulty finding words, inability to move left arm and left leg and numbness, dizziness s and confusion.  Patient was at work at about 8 AM when these symptoms started. At the time of my evaluation, patient is sedated, sleeping, moving all extremities, barely able to answer questions, otherwise history was obtained from sister at bedside. Patient was in her usual state of health until this morning.  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 Patient's sister at bedside, who has been a PA for about 12 years, also noted left facial droop, word finding difficulty and inability to move her left side, the symptoms have resolved.  Patient has been able to talk  in ED but speech is confused.  There has been no vomiting no diarrhea.  There might have been some rigidity noticed in the ED. ED provider did not note facial droop.  Was initially confused, she was given 5 mg of Valium, and Ativan 1 mg, and a paradoxical reaction, patient became extremely more confused, agitated, hallucinating, combative and diaphoretic.  States that bedside reports this has never happened before.  She denies any illicit drugs, and drinks alcohol very rarely/on social occasions.  Patient was started on Vyvanse 2 weeks ago for ADD, she has been on the same dose for the past 2 weeks. About 3 years ago at least, patient was on Vyvanse for several months , up to a year sister is not sure exactly.  She tolerated it well.   ED Course: Tmax 99.8.  Heart rate ranging mostly from 80s to 121.  Respiratory rate 10-28.  Blood pressure systolic 485-462.  O2 sats  greater than 95% on room air. Initial concern for stroke, stroke was called, teleneurology was consulted, head CT without acute abnormality, MRI brain, MRA head and neck-motion degraded without acute findings. 1 L bolus given. Ativan and Valium given with worsening of agitation, patient improved with 5 mg of haloperidol. EG showed sinus rhythm with ventricular bigeminy. Hospitalist admit for acute encephalopathy.   Review of Systems: Limited due to patient's altered mental status.     Hospital Course:     41 y.o. female with medical history significant for  HTN, PE in the setting of COVID. Patient was brought to the ED with initial come plaints of left facial droop, difficulty finding words, inability to move left arm and left leg and numbness, dizziness s and confusion.  Patient was at work at about 8 AM when these symptoms started. At the time of my evaluation, patient is sedated, sleeping, moving all extremities, barely able to answer questions, otherwise history was obtained from sister at bedside. Patient was in her usual state of health until this morning.  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 Patient's sister at bedside, who has been a PA for about 12 years, also noted left facial droop, word finding difficulty and inability to move her left side, the symptoms have resolved.  Patient has been able to talk in ED but speech is confused.  There has been no vomiting no diarrhea.  There might have been some rigidity noticed in the ED. ED provider did not note facial droop.  Was initially confused, she was given 5 mg of Valium, and Ativan 1 mg, and a paradoxical reaction, patient became extremely more confused, agitated, hallucinating, combative and diaphoretic.  States that bedside reports this has never happened before.  She denies any illicit drugs, and drinks alcohol very rarely/on social occasions.  Patient was started on Vyvanse 2 weeks  ago for ADD, she has been on the same dose for the past 2 weeks. About 3 years ago at least, patient was on Vyvanse for several months , up to a year sister is not sure exactly.  She tolerated it well.   ED Course: Tmax 99.8.  Heart rate ranging mostly from 80s to 121.  Respiratory rate 10-28.  Blood pressure systolic 703-500.  O2 sats greater than 95% on room air.  Initial concern for stroke, stroke was called, teleneurology was consulted, head CT without acute abnormality, MRI brain, MRA head and neck-motion degraded without acute findings.  1 L bolus  given.  Ativan and Valium given with worsening of agitation, patient improved with 5 mg of haloperidol.  EG showed sinus rhythm with ventricular bigeminy.  Hospitalist admit for acute encephalopathy.  Assessment and Plan: * Acute metabolic encephalopathy -- Resolved, mentation is back to baseline -???  If related to new medication Vyvanse -Neurology consult appreciated -No concerns about seizures at this time   Elevated CK -- CK 1,068 >> 1,395 >> 750   Anxiety and depression -- reportedly stable on no meds for this now    ADD (attention deficit disorder) Started on Vyvanse 2 weeks ago, has been on same dose for the past 2 weeks.  Was on same drug about 3 years ago for about a year, tolerated well. -Hold Vyvanse with acute encephalopathy which may be related to Vyvanse -Follow-up with PCP for further adjustment of ADD medications   Disposition:  home  Consultants:  Neurology    Discharge Condition: stable  Follow UP---pcp  Diet and Activity recommendation:  As advised  Discharge Instructions     Discharge Instructions     Call MD for:  difficulty breathing, headache or visual disturbances   Complete by: As directed    Call MD for:  persistant dizziness or light-headedness   Complete by: As directed    Call MD for:  persistant nausea and vomiting   Complete by: As directed    Call MD for:  temperature >100.4   Complete by:  As directed    Diet - low sodium heart healthy   Complete by: As directed    Discharge instructions   Complete by: As directed    1)Follow up with your Primary Care Provider Nevada Crane, Edwinna Areola, MD ) for Recheck and Re-evaluation for adjustment of your ADD and depression medications   Increase activity slowly   Complete by: As directed          Discharge Medications     Allergies as of 12/09/2021       Reactions   Ativan [lorazepam] Other (See Comments)   Acute delirium.   Valium [diazepam] Other (See Comments)   Acute delirium.    Ciprofloxacin    IV Only-Patient started sweating and becoming clammy when was infused with IV cipro on 08/09/14. Can tolerate PO Ciprofloxacin         Medication List     STOP taking these medications    megestrol 40 MG tablet Commonly known as: MEGACE   Vyvanse 10 MG capsule Generic drug: lisdexamfetamine       TAKE these medications    B-12 1000 MCG Tabs Take 1,000 mcg by mouth daily.   BARIATRIC MULTIVITAMINS/IRON PO Take 1 tablet by mouth daily.   CALCIUM 600+D3 PO Take 1 tablet by mouth 3 (three) times daily.   cholecalciferol 1000 units tablet Commonly known as: VITAMIN D Take 1,000 Units by mouth.        Major procedures and Radiology Reports - PLEASE review detailed and final reports for all details, in brief -    DG CHEST PORT 1 VIEW  Result Date: 12/08/2021 CLINICAL DATA:  Acute encephalopathy, leukocytosis, complaining of headache and intermittent chest pain EXAM: PORTABLE CHEST 1 VIEW COMPARISON:  Chest radiograph 11/12/2019 FINDINGS: The cardiomediastinal silhouette is stable. The lungs clear, with no focal consolidation or pulmonary edema. There is no pleural effusion or pneumothorax There is no acute osseous abnormality. IMPRESSION: No radiographic evidence of acute cardiopulmonary process. Electronically Signed   By: Valetta Mole M.D.   On:  12/08/2021 08:32   MR BRAIN WO CONTRAST  Result Date:  12/07/2021 CLINICAL DATA:  Neuro deficit, acute, stroke suspected EXAM: MRI HEAD WITHOUT CONTRAST MRA HEAD WITHOUT CONTRAST MRA NECK WITHOUT AND WITH CONTRAST TECHNIQUE: Multiplanar, multi-echo pulse sequences of the brain and surrounding structures were acquired without intravenous contrast. Angiographic images of the Circle of Willis were acquired using MRA technique without intravenous contrast. Angiographic images of the neck were acquired using MRA technique without and with intravenous contrast. Carotid stenosis measurements (when applicable) are obtained utilizing NASCET criteria, using the distal internal carotid diameter as the denominator. CONTRAST:  86m GADAVIST GADOBUTROL 1 MMOL/ML IV SOLN COMPARISON:  None Available. FINDINGS: MRI HEAD Brain: There is no acute infarction or intracranial hemorrhage. There is no intracranial mass, mass effect, or edema. There is no hydrocephalus or extra-axial fluid collection. Ventricles and sulci are normal in size and configuration. Vascular: Major vessel flow voids at the skull base are preserved. Skull and upper cervical spine: Normal marrow signal is preserved. Sinuses/Orbits: Paranasal sinuses are aerated. Orbits are unremarkable. Other: Sella is unremarkable.  Mastoid air cells are clear. MRA HEAD Degraded by motion. Intracranial internal carotid arteries are patent. Proximal middle and anterior cerebral arteries are patent. Intracranial vertebral arteries, basilar artery, proximal posterior cerebral arteries are patent. Likely fetal origin of the left PCA. MRA NECK Motion artifact is present. Great vessel origins are patent. No high-grade proximal subclavian stenosis. Common, internal, and external carotid arteries are patent. Extracranial vertebral arteries are patent. No hemodynamically significant stenosis identified. IMPRESSION: No acute infarction, hemorrhage, or mass. No large vessel occlusion, hemodynamically significant stenosis, or evidence dissection.  Electronically Signed   By: PMacy MisM.D.   On: 12/07/2021 11:03   MR ANGIO HEAD WO CONTRAST  Result Date: 12/07/2021 CLINICAL DATA:  Neuro deficit, acute, stroke suspected EXAM: MRI HEAD WITHOUT CONTRAST MRA HEAD WITHOUT CONTRAST MRA NECK WITHOUT AND WITH CONTRAST TECHNIQUE: Multiplanar, multi-echo pulse sequences of the brain and surrounding structures were acquired without intravenous contrast. Angiographic images of the Circle of Willis were acquired using MRA technique without intravenous contrast. Angiographic images of the neck were acquired using MRA technique without and with intravenous contrast. Carotid stenosis measurements (when applicable) are obtained utilizing NASCET criteria, using the distal internal carotid diameter as the denominator. CONTRAST:  122mGADAVIST GADOBUTROL 1 MMOL/ML IV SOLN COMPARISON:  None Available. FINDINGS: MRI HEAD Brain: There is no acute infarction or intracranial hemorrhage. There is no intracranial mass, mass effect, or edema. There is no hydrocephalus or extra-axial fluid collection. Ventricles and sulci are normal in size and configuration. Vascular: Major vessel flow voids at the skull base are preserved. Skull and upper cervical spine: Normal marrow signal is preserved. Sinuses/Orbits: Paranasal sinuses are aerated. Orbits are unremarkable. Other: Sella is unremarkable.  Mastoid air cells are clear. MRA HEAD Degraded by motion. Intracranial internal carotid arteries are patent. Proximal middle and anterior cerebral arteries are patent. Intracranial vertebral arteries, basilar artery, proximal posterior cerebral arteries are patent. Likely fetal origin of the left PCA. MRA NECK Motion artifact is present. Great vessel origins are patent. No high-grade proximal subclavian stenosis. Common, internal, and external carotid arteries are patent. Extracranial vertebral arteries are patent. No hemodynamically significant stenosis identified. IMPRESSION: No acute  infarction, hemorrhage, or mass. No large vessel occlusion, hemodynamically significant stenosis, or evidence dissection. Electronically Signed   By: PrMacy Mis.D.   On: 12/07/2021 11:03   MR ANGIO NECK W WO CONTRAST  Result Date: 12/07/2021 CLINICAL DATA:  Neuro deficit, acute, stroke suspected EXAM: MRI HEAD WITHOUT CONTRAST MRA HEAD WITHOUT CONTRAST MRA NECK WITHOUT AND WITH CONTRAST TECHNIQUE: Multiplanar, multi-echo pulse sequences of the brain and surrounding structures were acquired without intravenous contrast. Angiographic images of the Circle of Willis were acquired using MRA technique without intravenous contrast. Angiographic images of the neck were acquired using MRA technique without and with intravenous contrast. Carotid stenosis measurements (when applicable) are obtained utilizing NASCET criteria, using the distal internal carotid diameter as the denominator. CONTRAST:  58m GADAVIST GADOBUTROL 1 MMOL/ML IV SOLN COMPARISON:  None Available. FINDINGS: MRI HEAD Brain: There is no acute infarction or intracranial hemorrhage. There is no intracranial mass, mass effect, or edema. There is no hydrocephalus or extra-axial fluid collection. Ventricles and sulci are normal in size and configuration. Vascular: Major vessel flow voids at the skull base are preserved. Skull and upper cervical spine: Normal marrow signal is preserved. Sinuses/Orbits: Paranasal sinuses are aerated. Orbits are unremarkable. Other: Sella is unremarkable.  Mastoid air cells are clear. MRA HEAD Degraded by motion. Intracranial internal carotid arteries are patent. Proximal middle and anterior cerebral arteries are patent. Intracranial vertebral arteries, basilar artery, proximal posterior cerebral arteries are patent. Likely fetal origin of the left PCA. MRA NECK Motion artifact is present. Great vessel origins are patent. No high-grade proximal subclavian stenosis. Common, internal, and external carotid arteries are  patent. Extracranial vertebral arteries are patent. No hemodynamically significant stenosis identified. IMPRESSION: No acute infarction, hemorrhage, or mass. No large vessel occlusion, hemodynamically significant stenosis, or evidence dissection. Electronically Signed   By: PMacy MisM.D.   On: 12/07/2021 11:03   CT HEAD CODE STROKE WO CONTRAST  Result Date: 12/07/2021 CLINICAL DATA:  Code stroke. Aphasia. Left-sided weakness. Facial droop which has resolved. EXAM: CT HEAD WITHOUT CONTRAST TECHNIQUE: Contiguous axial images were obtained from the base of the skull through the vertex without intravenous contrast. RADIATION DOSE REDUCTION: This exam was performed according to the departmental dose-optimization program which includes automated exposure control, adjustment of the mA and/or kV according to patient size and/or use of iterative reconstruction technique. COMPARISON:  None Available. FINDINGS: Brain: No acute infarct, hemorrhage, or mass lesion is present. No significant white matter lesions are present. Basal ganglia are intact. No acute or focal cortical abnormality is present. The ventricles are of normal size. No significant extraaxial fluid collection is present. The brainstem and cerebellum are within normal limits. Vascular: Flow is present in the major intracranial arteries. Skull: Calvarium is intact. No focal lytic or blastic lesions are present. Sinuses/Orbits: Calvarium is intact. No focal lytic or blastic lesions are present. Other: ASPECTS (AMaruenoStroke Program Early CT Score) - Ganglionic level infarction (caudate, lentiform nuclei, internal capsule, insula, M1-M3 cortex): 7/7 - Supraganglionic infarction (M4-M6 cortex): 3/3 Total score (0-10 with 10 being normal): 10/10 IMPRESSION: Negative CT of the head. Aspects 10/10 These results were called by telephone at the time of interpretation on 12/07/2021 at 9:25 am [facility] who verbally acknowledged these results. Final  report was delayed due to a delay in information to the PACS. Electronically Signed   By: CSan MorelleM.D.   On: 12/07/2021 10:14   MM DIAG BREAST TOMO BILATERAL  Result Date: 11/10/2021 CLINICAL DATA:  Palpable lump in the medial inferior left breast. EXAM: DIGITAL DIAGNOSTIC BILATERAL MAMMOGRAM WITH TOMOSYNTHESIS AND CAD; ULTRASOUND LEFT BREAST LIMITED TECHNIQUE: Bilateral digital diagnostic mammography and breast tomosynthesis was performed. The images were evaluated with computer-aided detection.; Targeted ultrasound examination  of the left breast was performed. COMPARISON:  None available. ACR Breast Density Category c: The breast tissue is heterogeneously dense, which may obscure small masses. FINDINGS: There is a superficial mass containing calcifications, likely a sebaceous gland. On the MLO view, this is clearly skin related. No suspicious masses, calcifications, or distortion identified in either breast. On physical exam, there is a superficial lump pointed out by the patient located medially. Targeted ultrasound is performed, showing a sebaceous gland with a clear connection between the gland in the skin surface at 8:30, 9 cm from the nipple in the left breast accounting for the patient's symptoms. IMPRESSION: The patient is palpating a sebaceous gland. No evidence of malignancy. RECOMMENDATION: Recommend annual screening mammography. I have discussed the findings and recommendations with the patient. If applicable, a reminder letter will be sent to the patient regarding the next appointment. BI-RADS CATEGORY  2: Benign. Electronically Signed   By: Dorise Bullion III M.D.   On: 11/10/2021 15:05  US BREAST LTD UNI LEFT INC AXILLA  Result Date: 11/10/2021 CLINICAL DATA:  Palpable lump in the medial inferior left breast. EXAM: DIGITAL DIAGNOSTIC BILATERAL MAMMOGRAM WITH TOMOSYNTHESIS AND CAD; ULTRASOUND LEFT BREAST LIMITED TECHNIQUE: Bilateral digital diagnostic mammography and breast  tomosynthesis was performed. The images were evaluated with computer-aided detection.; Targeted ultrasound examination of the left breast was performed. COMPARISON:  None available. ACR Breast Density Category c: The breast tissue is heterogeneously dense, which may obscure small masses. FINDINGS: There is a superficial mass containing calcifications, likely a sebaceous gland. On the MLO view, this is clearly skin related. No suspicious masses, calcifications, or distortion identified in either breast. On physical exam, there is a superficial lump pointed out by the patient located medially. Targeted ultrasound is performed, showing a sebaceous gland with a clear connection between the gland in the skin surface at 8:30, 9 cm from the nipple in the left breast accounting for the patient's symptoms. IMPRESSION: The patient is palpating a sebaceous gland. No evidence of malignancy. RECOMMENDATION: Recommend annual screening mammography. I have discussed the findings and recommendations with the patient. If applicable, a reminder letter will be sent to the patient regarding the next appointment. BI-RADS CATEGORY  2: Benign. Electronically Signed   By: Dorise Bullion III M.D.   On: 11/10/2021 15:05   Micro Results   Recent Results (from the past 240 hour(s))  Resp Panel by RT-PCR (Flu A&B, Covid) Anterior Nasal Swab     Status: None   Collection Time: 12/07/21  9:06 AM   Specimen: Anterior Nasal Swab  Result Value Ref Range Status   SARS Coronavirus 2 by RT PCR NEGATIVE NEGATIVE Final    Comment: (NOTE) SARS-CoV-2 target nucleic acids are NOT DETECTED.  The SARS-CoV-2 RNA is generally detectable in upper respiratory specimens during the acute phase of infection. The lowest concentration of SARS-CoV-2 viral copies this assay can detect is 138 copies/mL. A negative result does not preclude SARS-Cov-2 infection and should not be used as the sole basis for treatment or other patient management decisions. A  negative result may occur with  improper specimen collection/handling, submission of specimen other than nasopharyngeal swab, presence of viral mutation(s) within the areas targeted by this assay, and inadequate number of viral copies(<138 copies/mL). A negative result must be combined with clinical observations, patient history, and epidemiological information. The expected result is Negative.  Fact Sheet for Patients:  EntrepreneurPulse.com.au  Fact Sheet for Healthcare Providers:  IncredibleEmployment.be  This test is no t yet  approved or cleared by the [place] and  has been authorized for detection and/or diagnosis of SARS-CoV-2 by FDA under an Emergency Use Authorization (EUA). This EUA will remain  in effect (meaning this test can be used) for the duration of the COVID-19 declaration under Section 564(b)(1) of the Act, 21 U.S.C.section 360bbb-3(b)(1), unless the authorization is terminated  or revoked sooner.       Influenza A by PCR NEGATIVE NEGATIVE Final   Influenza B by PCR NEGATIVE NEGATIVE Final    Comment: (NOTE) The Xpert Xpress SARS-CoV-2/FLU/RSV plus assay is intended as an aid in the diagnosis of influenza from Nasopharyngeal swab specimens and should not be used as a sole basis for treatment. Nasal washings and aspirates are unacceptable for Xpert Xpress SARS-CoV-2/FLU/RSV testing.  Fact Sheet for Patients: EntrepreneurPulse.com.au  Fact Sheet for Healthcare Providers: IncredibleEmployment.be  This test is not yet approved or cleared by the [place] FDA and has been authorized for detection and/or diagnosis of SARS-CoV-2 by FDA under an Emergency Use Authorization (EUA). This EUA will remain in effect (meaning this test can be used) for the duration of the COVID-19 declaration under Section 564(b)(1) of the Act, 21 U.S.C. section 360bbb-3(b)(1), unless the authorization  is terminated or revoked.  Performed at [facility], [address], [place], [place] [address]   MRSA Next Gen by PCR, Nasal     Status: None   Collection Time: 12/07/21  6:42 PM   Specimen: Nasal Mucosa; Nasal Swab  Result Value Ref Range Status   MRSA by PCR Next Gen NOT DETECTED NOT DETECTED Final    Comment: (NOTE) The GeneXpert MRSA Assay (FDA approved for NASAL specimens only), is one component of a comprehensive MRSA colonization surveillance program. It is not intended to diagnose MRSA infection nor to guide or monitor treatment for MRSA infections. Test performance is not FDA approved in patients less than 10 years old. Performed at [facility], [address] [place], [place] [address]    Today   Subjective    Paula King today has no new complaints -Patient states she feels back to normal -Talking with family on the phone -Coherent and oriented          Patient has been seen and examined prior to discharge   Objective   Blood pressure 117/82, pulse 81, temperature 98.1 F (36.7 C), temperature source Oral, resp. rate 17, height '5\' 4"'$  (1.626 m), weight 95.4 kg, SpO2 98 %.   Intake/Output Summary (Last 24 hours) at 12/09/2021 1151 Last data filed at 12/09/2021 0500 Gross per 24 hour  Intake 1178.69 ml  Output --  Net 1178.69 ml    Exam Gen:- Awake Alert, no acute distress, obese, cooperative HEENT:- [place].AT, No sclera icterus Neck-Supple Neck,No JVD,.  Lungs-  CTAB , good air movement bilaterally CV- S1, S2 normal, regular Abd-  +ve B.Sounds, Abd Soft, No tenderness, increased truncal adiposity Extremity/Skin:- No  edema,   good pulses Psych-affect is appropriate, oriented x3 Neuro-no new focal deficits, no tremors    Data Review   CBC w Diff:  Lab Results  Component Value Date   WBC 10.9 (H) 12/09/2021   HGB 13.0 12/09/2021   HGB 14.2 05/25/2021   HCT 39.1 12/09/2021   HCT 42.4 05/25/2021   PLT 241 12/09/2021   PLT 323 05/25/2021    LYMPHOPCT 15 12/09/2021   MONOPCT 8 12/09/2021   EOSPCT 0 12/09/2021   BASOPCT 0 12/09/2021    CMP:  Lab Results  Component Value Date   NA 140 12/09/2021   NA 140 10/28/2021   K 3.9 12/09/2021   CL 115 (H) 12/09/2021   CO2 21 (L) 12/09/2021   BUN 15 12/09/2021   BUN 14 10/28/2021   CREATININE 0.77 12/09/2021   CREATININE 0.98 07/18/2019   GLU 76 10/28/2021   PROT 6.4 (L) 12/09/2021   ALBUMIN 3.4 (L) 12/09/2021   BILITOT 0.5 12/09/2021   ALKPHOS 47 12/09/2021   AST 53 (H) 12/09/2021   ALT 64 (H) 12/09/2021  .  Total Discharge time is about 33 minutes  Roxan Hockey M.D on 12/09/2021 at 11:51 AM  Go to [email or web address] -  for contact info  [facility] - Office  [phone]

## 2021-12-09 NOTE — Progress Notes (Signed)
Nsg Discharge Note  Admit Date:  12/07/2021 Discharge date: 12/09/2021   Paula King to be D/C'd Home per MD order.  AVS completed.  Patient/caregiver able to verbalize understanding.  Discharge Medication: Allergies as of 12/09/2021       Reactions   Ativan [lorazepam] Other (See Comments)   Acute delirium.   Valium [diazepam] Other (See Comments)   Acute delirium.    Ciprofloxacin    IV Only-Patient started sweating and becoming clammy when was infused with IV cipro on 08/09/14. Can tolerate PO Ciprofloxacin         Medication List     STOP taking these medications    megestrol 40 MG tablet Commonly known as: MEGACE   Vyvanse 10 MG capsule Generic drug: lisdexamfetamine       TAKE these medications    B-12 1000 MCG Tabs Take 1,000 mcg by mouth daily.   BARIATRIC MULTIVITAMINS/IRON PO Take 1 tablet by mouth daily.   CALCIUM 600+D3 PO Take 1 tablet by mouth 3 (three) times daily.   cholecalciferol 1000 units tablet Commonly known as: VITAMIN D Take 1,000 Units by mouth.        Discharge Assessment: Vitals:   12/09/21 0245 12/09/21 0537  BP: 105/84 117/82  Pulse: 82 81  Resp: 17 17  Temp: 97.8 F (36.6 C) 98.1 F (36.7 C)  SpO2: 98% 98%   Skin clean, dry and intact without evidence of skin break down, no evidence of skin tears noted. IV catheter discontinued intact. Site without signs and symptoms of complications - no redness or edema noted at insertion site, patient denies c/o pain - only slight tenderness at site.  Dressing with slight pressure applied.  D/c Instructions-Education: Discharge instructions given to patient/family with verbalized understanding. D/c education completed with patient/family including follow up instructions, medication list, d/c activities limitations if indicated, with other d/c instructions as indicated by MD - patient able to verbalize understanding, all questions fully answered. Patient instructed to return to  ED, call 911, or call MD for any changes in condition.  Patient escorted via [place], and D/C home via private auto.  Kathie Rhodes, RN 12/09/2021 11:53 AM

## 2021-12-11 ENCOUNTER — Ambulatory Visit (HOSPITAL_COMMUNITY)
Admission: RE | Admit: 2021-12-11 | Discharge: 2021-12-11 | Disposition: A | Discharge status: Home / Self Care | Payer: Medicaid Other | Source: Ambulatory Visit | Attending: "Endocrinology | Admitting: "Endocrinology

## 2021-12-11 DIAGNOSIS — E049 Nontoxic goiter, unspecified: Secondary | ICD-10-CM

## 2021-12-11 DIAGNOSIS — D3501 Benign neoplasm of right adrenal gland: Secondary | ICD-10-CM | POA: Insufficient documentation

## 2021-12-11 IMAGING — US US THYROID
1 series · 14 of 25 positions shown · non-contrast
Comparison: [DATE]

CLINICAL DATA: Goiter.

EXAM:
THYROID ULTRASOUND
TECHNIQUE: Ultrasound examination of the thyroid gland and adjacent soft
tissues was performed.

[Series 1: us thyroid · 14 of 34 slices shown]
[im 1/34]
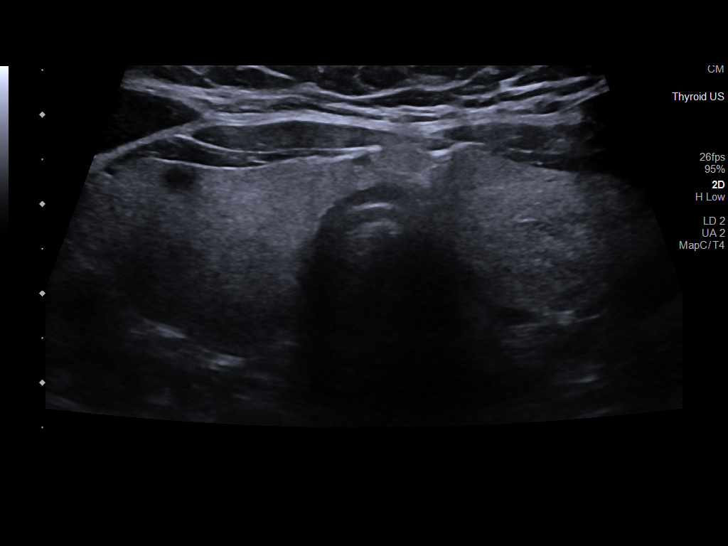
[im 3/34]
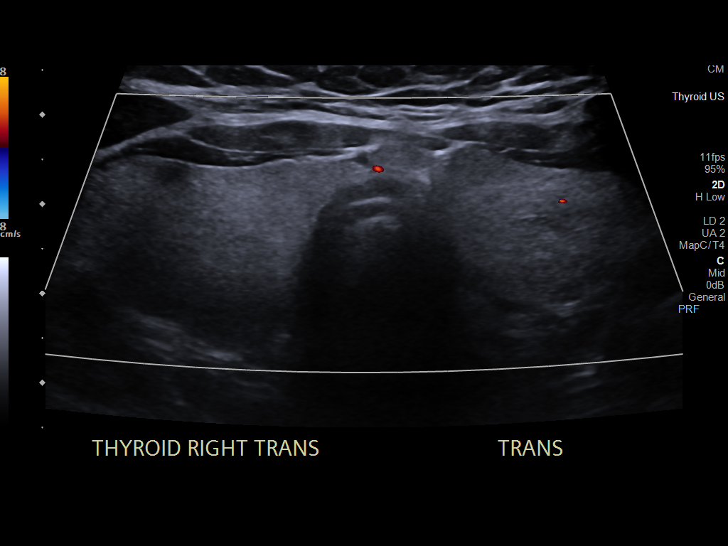
[im 6/34]
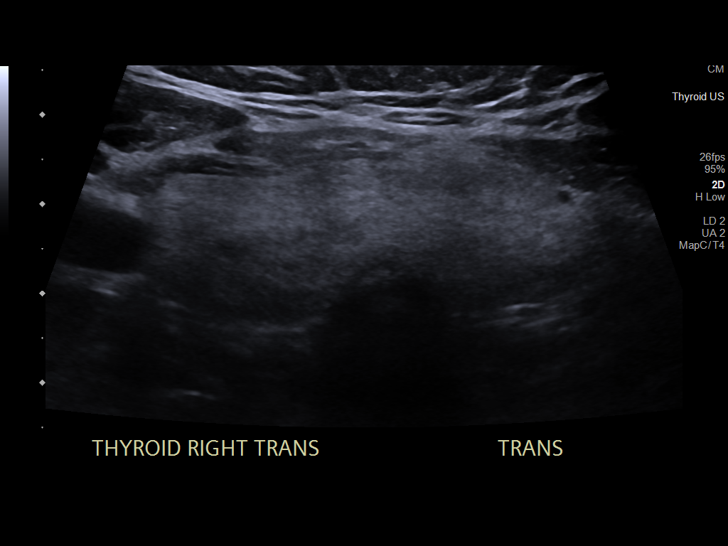
[im 9/34]
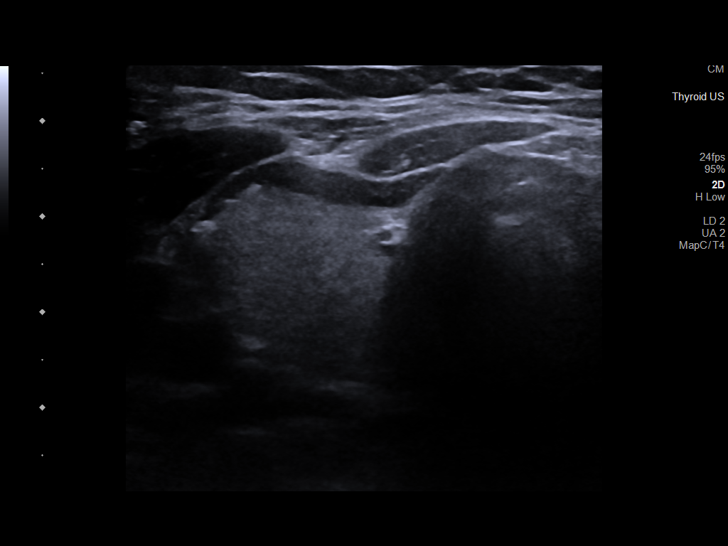
[im 12/34]
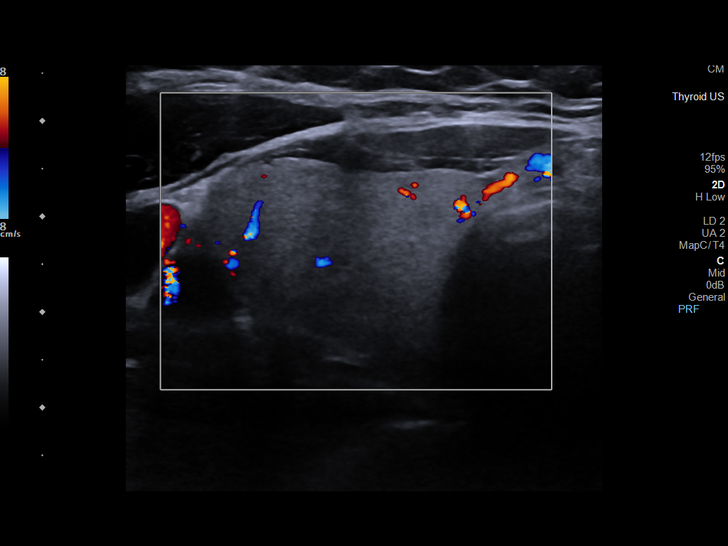
[im 13/34]
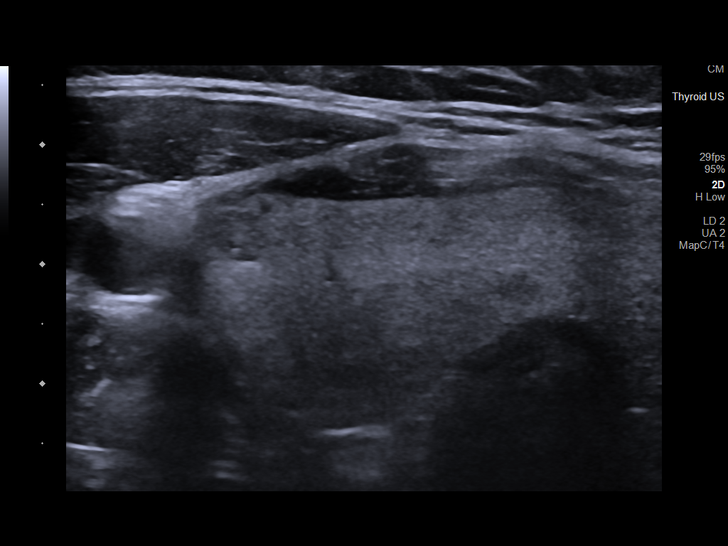
[im 16/34]
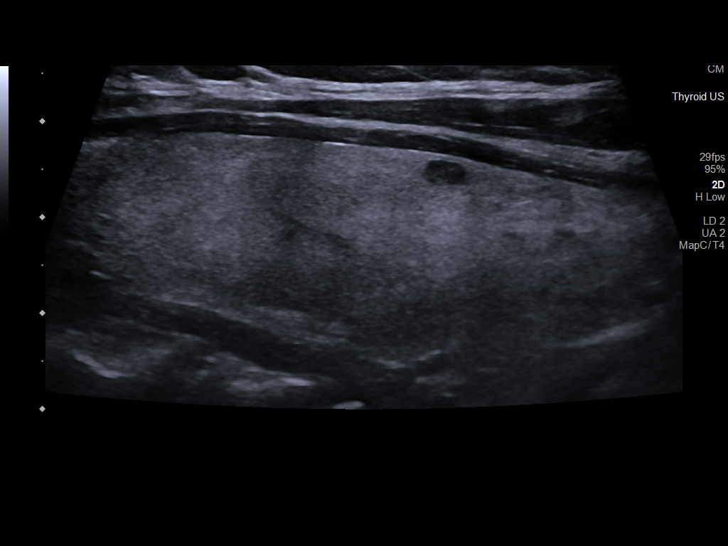
[im 18/34]
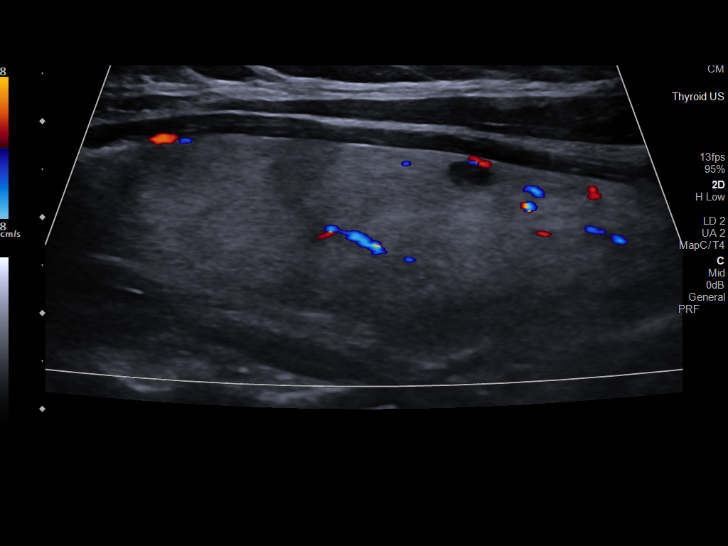
[im 21/34]
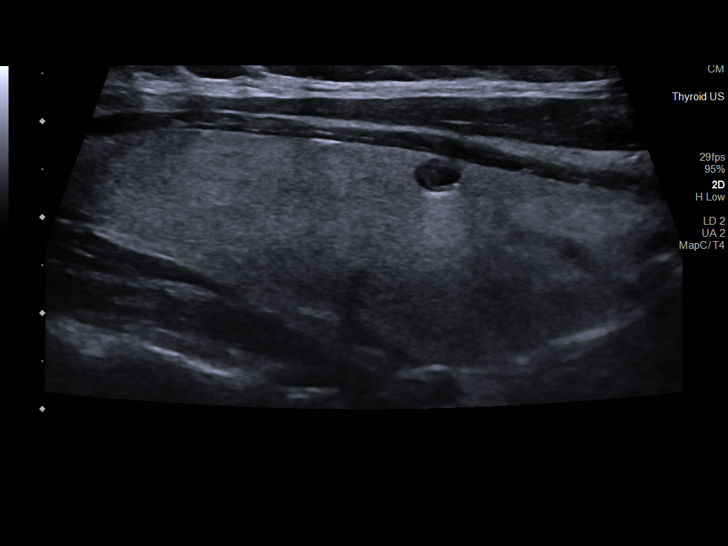
[im 23/34]
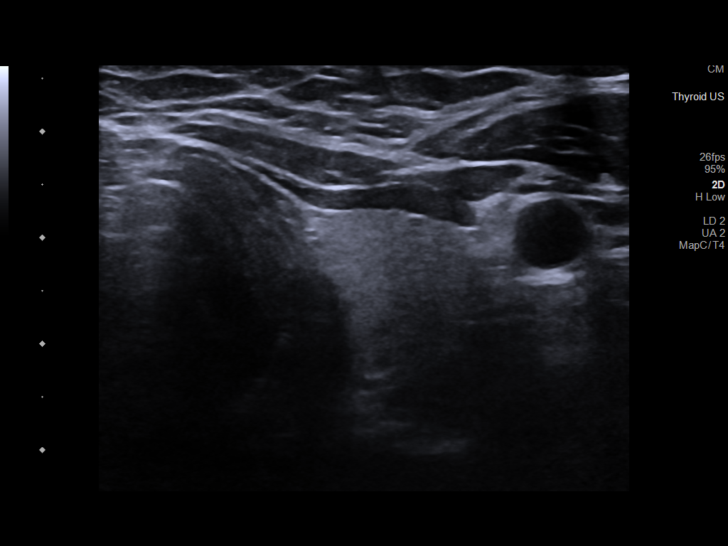
[im 25/34]
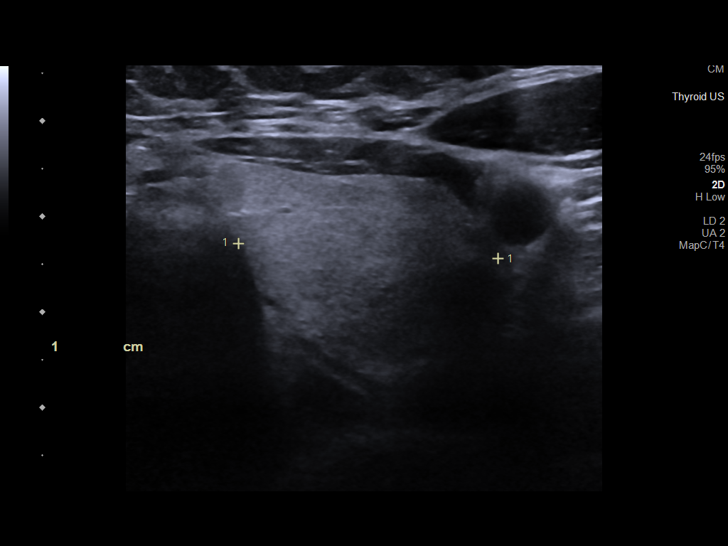
[im 28/34]
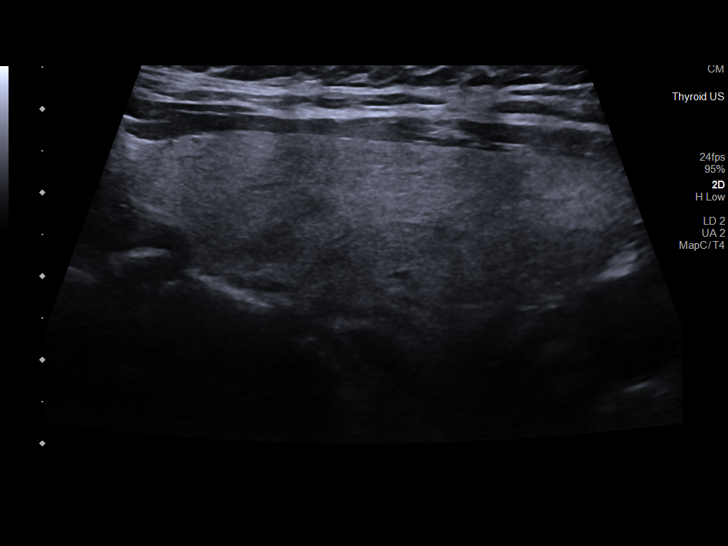
[im 31/34]
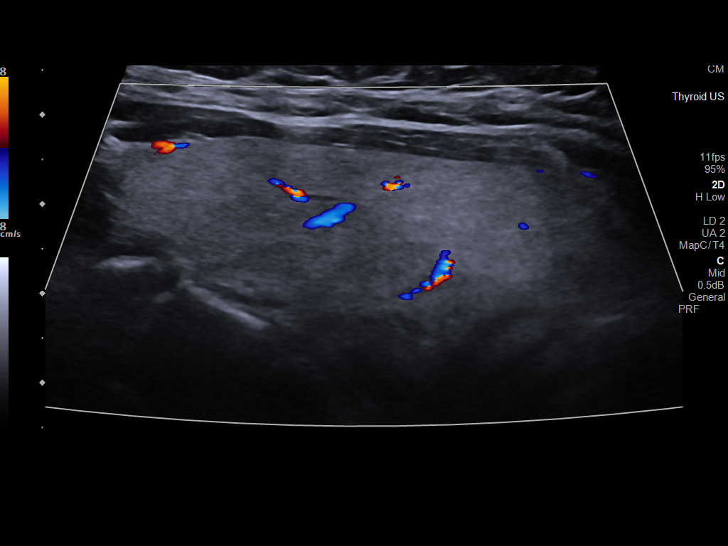
[im 34/34]
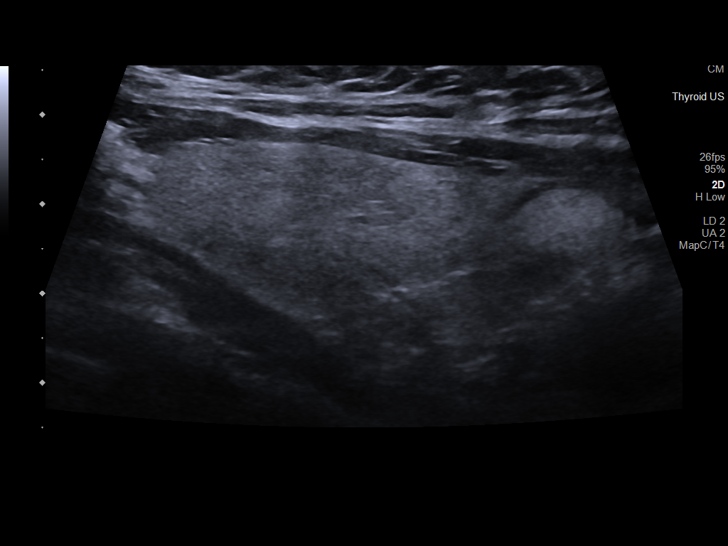

[14 of 25 positions shown; findings below may reference images not displayed]

FINDINGS: Parenchymal Echotexture: Mildly heterogenous

Isthmus: 9 mm

Right lobe: 6.5 x 2.3 x 3.0 cm

Left lobe: 6.7 x 2.6 x 2.7 cm

_________________________________________________________

Estimated total number of nodules >/= 1 cm: 0

Number of spongiform nodules >/=  2 cm not described below (TR1): 0

Number of mixed cystic and solid nodules >/= 1.5 cm not described
below (TR2): 0

_________________________________________________________

Similar minor thyroid heterogeneity. Stable benign subcentimeter
cystic nodule in the right mid thyroid measures only 5 mm. No other
significant thyroid abnormality. No hypervascularity. Negative for
adenopathy.
IMPRESSION: Nonspecific minor thyroid heterogeneity and slight enlargement.
Benign subcentimeter cystic nodule noted.

No new or enlarging thyroid abnormality.

The above is in keeping with the ACR TI-RADS recommendations - [HOSPITAL] [ZN];[DATE].

## 2021-12-11 IMAGING — CT CT ABDOMEN W/O CM
2 of 4 series · 15 of 46 positions shown, 17 images · non-contrast
Comparison: [DATE] CT scan of the abdomen and pelvis.

CLINICAL DATA: Adrenal mass.

EXAM:
CT ABDOMEN WITHOUT CONTRAST
TECHNIQUE: Multidetector CT imaging of the abdomen was performed following the
standard protocol without IV contrast.
RADIATION DOSE REDUCTION: This exam was performed according to the
departmental dose-optimization program which includes automated
exposure control, adjustment of the mA and/or kV according to
patient size and/or use of iterative reconstruction technique.

[Series 2: axial st · axial · 0.93mm/px · z∈[+992,+1250]mm · 12 of 98 slices shown, 14 images]
[im 6/98  soft-tissue]
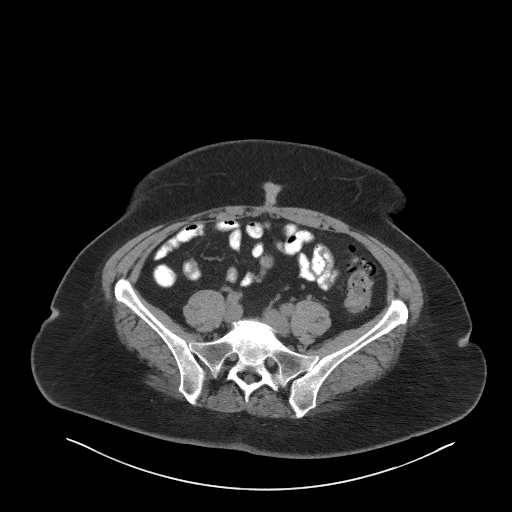
[im 6/98  bone]
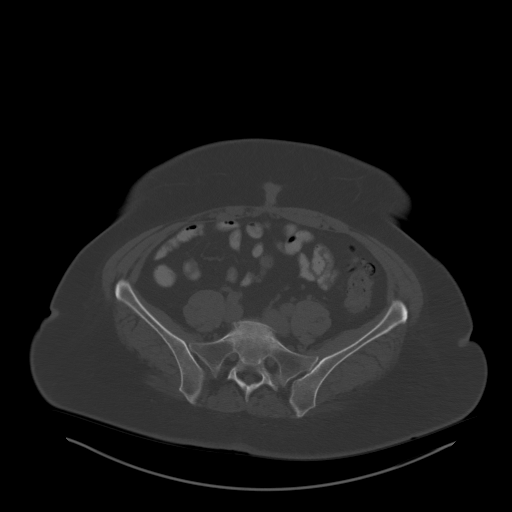
[im 17/98  soft-tissue]
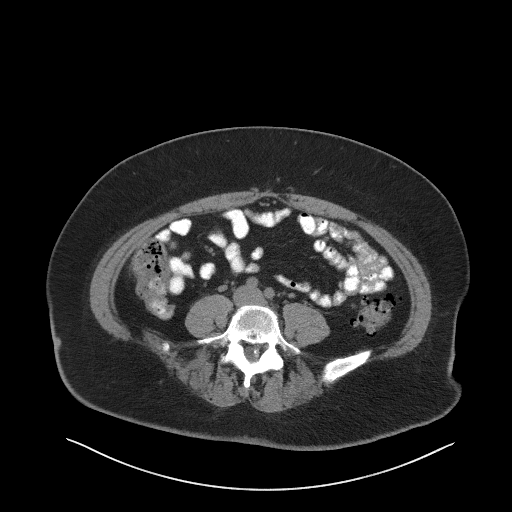
[im 22/98  soft-tissue]
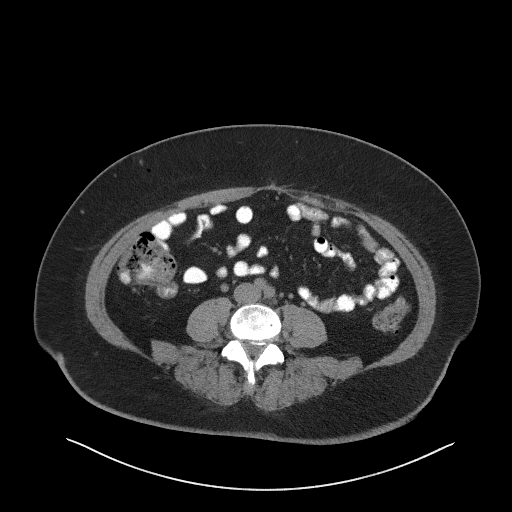
[im 27/98  soft-tissue]
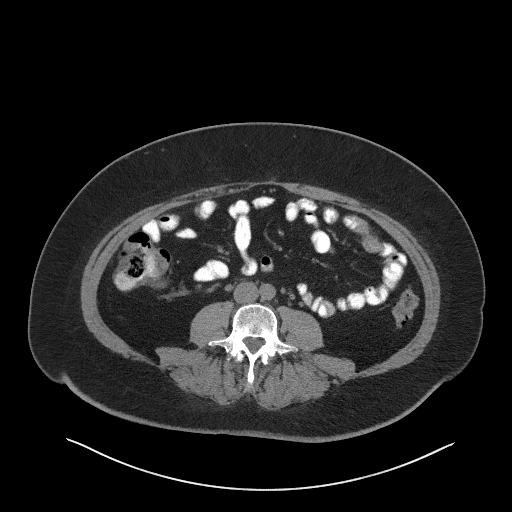
[im 38/98  soft-tissue]
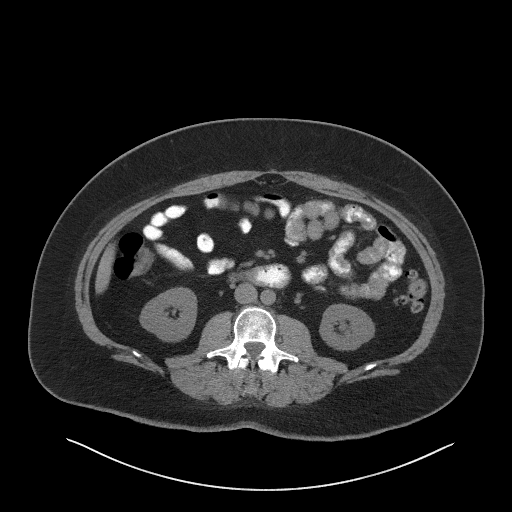
[im 44/98  soft-tissue]
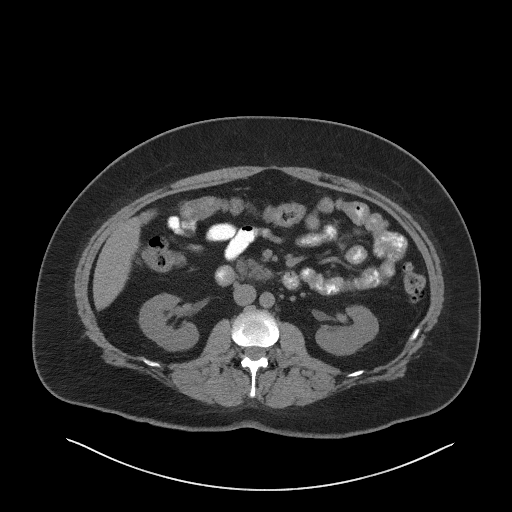
[im 54/98  soft-tissue]
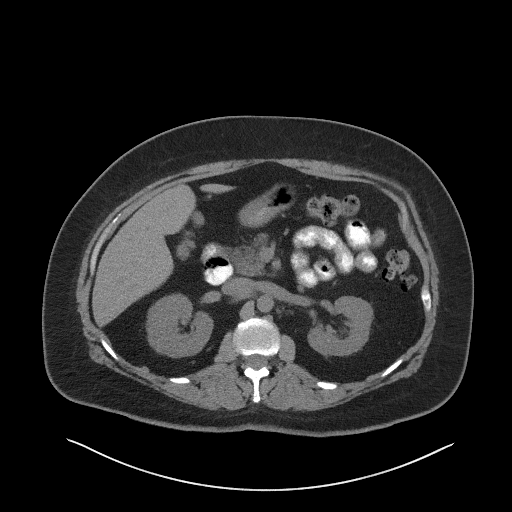
[im 60/98  soft-tissue]
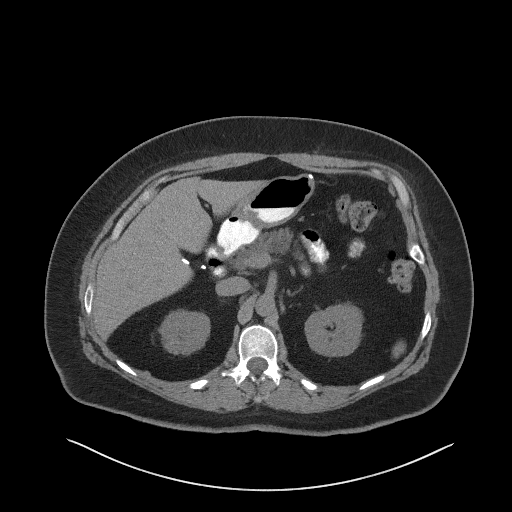
[im 71/98  soft-tissue]
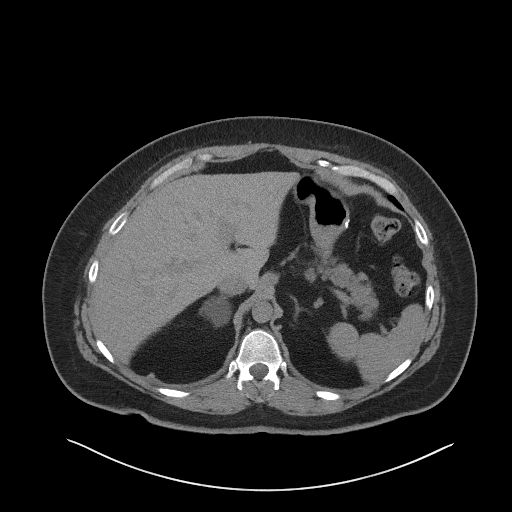
[im 71/98  bone]
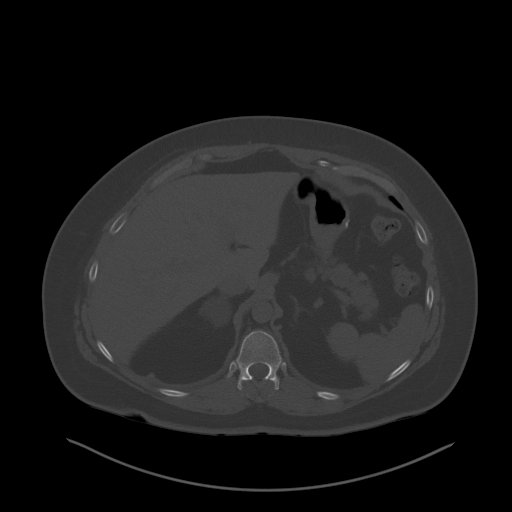
[im 76/98  soft-tissue]
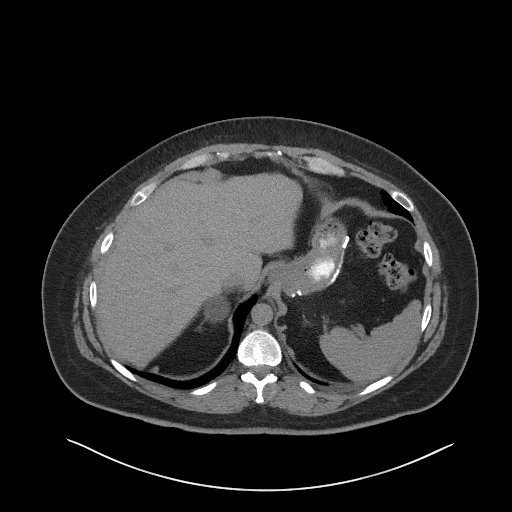
[im 81/98  soft-tissue]
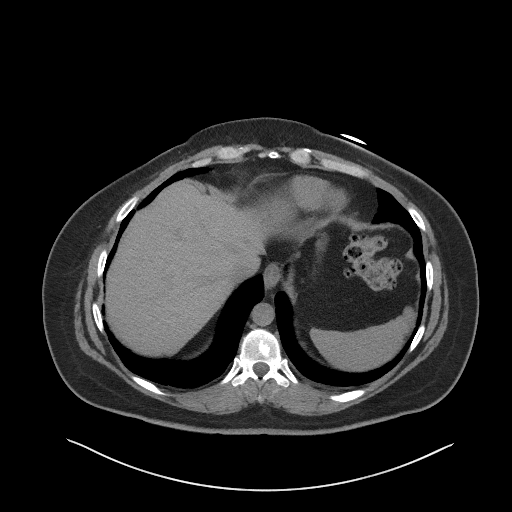
[im 92/98  soft-tissue]
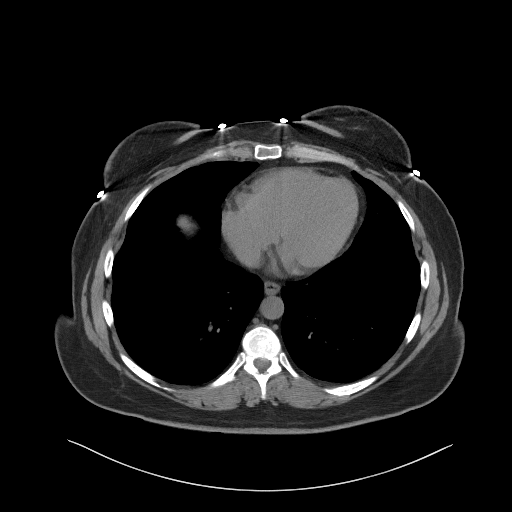

[Series 5: coronal wo · coronal · 0.61mm/px · 3 of 175 slices shown]
[im 59/175  soft-tissue]
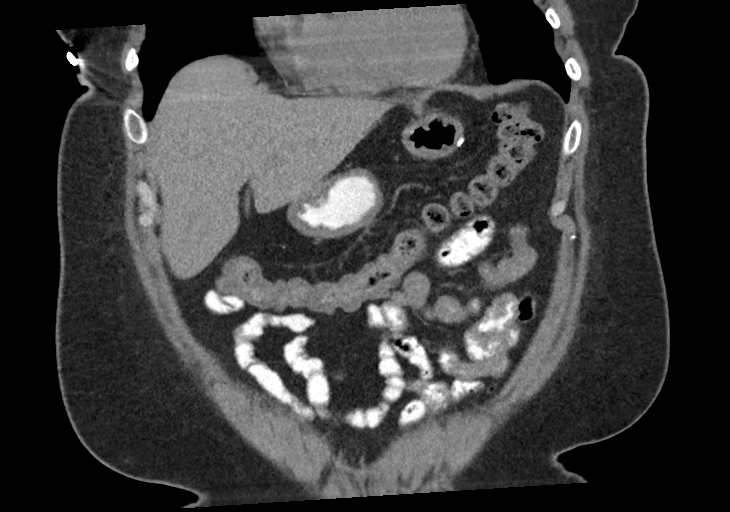
[im 78/175  soft-tissue]
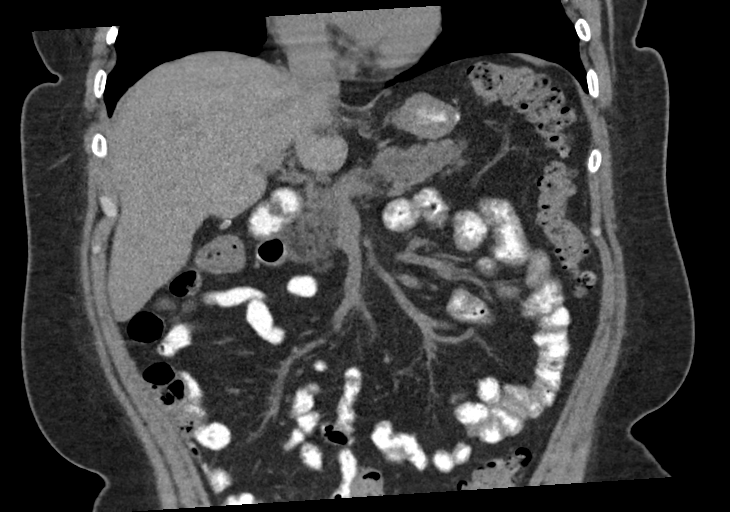
[im 97/175  soft-tissue]
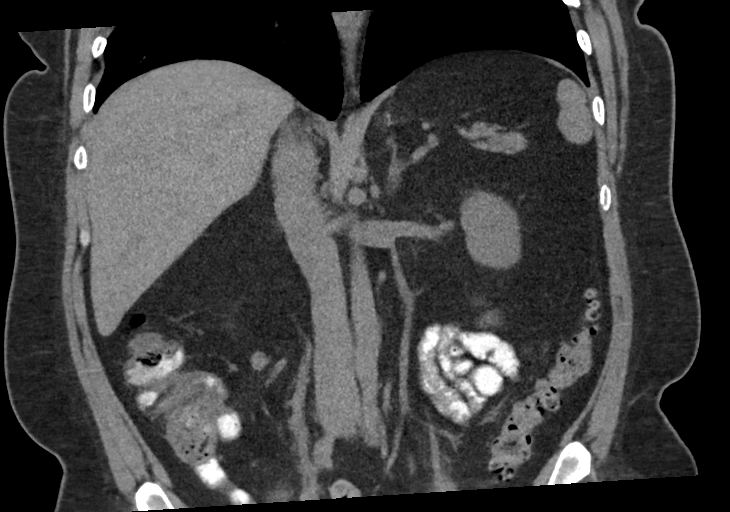

[15 of 46 positions shown; findings below may reference images not displayed]

FINDINGS: Lower chest: No acute abnormality.

Hepatobiliary: No focal liver abnormality is seen. Status post
cholecystectomy. No biliary dilatation.

Pancreas: Unremarkable. No pancreatic ductal dilatation or
surrounding inflammatory changes.

Spleen: Normal in size without focal abnormality.

Adrenals/Urinary Tract: There is a right adrenal mass measuring
cm with an attenuation of -10 Hounsfield units. This mass is not
significantly changed since [DATE]. Adrenal glands are
otherwise normal. Kidneys are unremarkable.

Stomach/Bowel: Patient is status post gastric surgery. Remaining
stomach and small bowel are normal. Diverticulosis is identified in
the descending colon. Visualized portions of the appendix are
normal.

Vascular/Lymphatic: No significant vascular findings are present. No
enlarged abdominal or pelvic lymph nodes.

Other: No abdominal wall hernia or abnormality.

Musculoskeletal: Posterior disc bulges at multiple levels in the
lumbar spine, most marked at L5-S1. Posterior disc bulges are also
seen at L3-4 and L4-5.
IMPRESSION: 1. The right adrenal mass measuring 3.1 cm is not incidental. This
mass is unchanged since [DATE] with an attenuation of
less than 0 Hounsfield units consistent with a benign adenoma. No
follow-up imaging recommended.
2. Diverticulosis in the descending colon without diverticulitis.
3. Posterior disc bulges at L3-4, L4-5, and L5-S1.

## 2021-12-14 ENCOUNTER — Ambulatory Visit: Payer: Medicaid Other | Admitting: "Endocrinology

## 2022-01-01 ENCOUNTER — Encounter: Payer: Self-pay | Admitting: "Endocrinology

## 2022-01-01 ENCOUNTER — Ambulatory Visit (INDEPENDENT_AMBULATORY_CARE_PROVIDER_SITE_OTHER): Payer: Medicaid Other | Admitting: "Endocrinology

## 2022-01-01 VITALS — BP 120/78 | HR 68 | Ht 64.0 in | Wt 217.4 lb

## 2022-01-01 DIAGNOSIS — E678 Other specified hyperalimentation: Secondary | ICD-10-CM | POA: Insufficient documentation

## 2022-01-01 DIAGNOSIS — Z6837 Body mass index (BMI) 37.0-37.9, adult: Secondary | ICD-10-CM

## 2022-01-01 DIAGNOSIS — E049 Nontoxic goiter, unspecified: Secondary | ICD-10-CM

## 2022-01-01 DIAGNOSIS — D3501 Benign neoplasm of right adrenal gland: Secondary | ICD-10-CM

## 2022-01-01 NOTE — Progress Notes (Signed)
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[place])    Diverticulitis    GERD (gastroesophageal reflux disease)    Hypertension    Miscarriage    Obese    Pneumonia    Preeclampsia    Pulmonary embolism ([place])    Vaginal Pap smear, abnormal    Past Surgical History:  Procedure Laterality Date   CESAREAN SECTION     CHOLECYSTECTOMY     LAPAROSCOPIC GASTRIC SLEEVE RESECTION N/A 02/05/2020   Procedure: LAPAROSCOPIC SLEEVE GASTRECTOMY;  Surgeon: Clovis Riley, MD;  Location: [facility];  Service: General;  Laterality: N/A;   NASAL SEPTOPLASTY W/ TURBINOPLASTY N/A 07/27/2019   Procedure: NASAL SEPTOPLASTY WITH TURBINATE REDUCTION;  Surgeon: Leta Baptist, MD;  Location: [facility];  Service: ENT;  Laterality: N/A;   UPPER GI ENDOSCOPY N/A 02/05/2020   Procedure: UPPER GI ENDOSCOPY;  Surgeon: Clovis Riley, MD;  Location: [facility];  Service: General;  Laterality: N/A;   WISDOM TOOTH EXTRACTION     Social History   Socioeconomic History   Marital status: Single    Spouse name: Not on file   Number of children: 1   Years of education: Not on file   Highest education level: Some college, no degree  Occupational History    Employer: [employer]  Tobacco Use   Smoking status: Never   Smokeless tobacco: Never  Vaping Use   Vaping Use: Never used  Substance and Sexual Activity   Alcohol use: Yes    Comment: OCCASSIONAL   Drug use: No   Sexual activity: Not Currently    Birth control/protection: Coitus interruptus, None, I.U.D.  Other Topics Concern   Not on file  Social History Narrative   Lives in [place], [place].   Single, not dating.    Has one child, age 36, 8.   Has family in [place], [place].   Enjoys  swimming.   Enjoys time with family.    Eats all foods.   Wears seatbelt.    Social Determinants of Health   Financial Resource Strain: Low Risk  (02/10/2021)   Overall Financial Resource Strain (CARDIA)    Difficulty of Paying Living Expenses: Not very hard  Food Insecurity: No Food Insecurity (02/10/2021)   Hunger Vital Sign    Worried About Running Out of Food in the Last Year: Never true    Ran Out of Food in the Last Year: Never true  Transportation Needs: No Transportation Needs (02/10/2021)   PRAPARE - Hydrologist (Medical): No    Lack of Transportation (Non-Medical): No  Physical Activity: Insufficiently Active (02/10/2021)   Exercise Vital Sign    Days of Exercise per Week: 3 days    Minutes of Exercise per Session: 30 min  Stress: No Stress Concern Present (02/10/2021)   [place]    Feeling of Stress : Only a little  Social Connections: Socially Isolated (02/10/2021)  Social Licensed conveyancer [NHANES]    Frequency of Communication with Friends and Family: More than three times a week    Frequency of Social Gatherings with Friends and Family: Twice a week    Attends Religious Services: Never    Marine scientist or Organizations: No    Attends Archivist Meetings: Never    Marital Status: Never married   Outpatient Encounter Medications as of 01/01/2022  Medication Sig   Calcium Carb-Cholecalciferol (CALCIUM 600+D3 PO) Take 1 tablet by mouth 3 (three) times daily.   cholecalciferol (VITAMIN D) 1000 units tablet Take 1,000 Units by mouth.    Multiple Vitamins-Minerals (BARIATRIC MULTIVITAMINS/IRON PO) Take 1 tablet by mouth daily.   [DISCONTINUED] Cyanocobalamin (B-12) 1000 MCG TABS Take 1,000 mcg by mouth daily.    No facility-administered encounter medications on file as of 01/01/2022.   ALLERGIES: Allergies  Allergen Reactions   Ativan  [Lorazepam] Other (See Comments)    Acute delirium.   Valium [Diazepam] Other (See Comments)    Acute delirium.    Ciprofloxacin     IV Only-Patient started sweating and becoming clammy when was infused with IV cipro on 08/09/14. Can tolerate PO Ciprofloxacin     VACCINATION STATUS: Immunization History  Administered Date(s) Administered   Influenza,inj,Quad PF,6+ Mos 04/26/2019   Influenza-Unspecified 04/03/2020    HPI Paula King is 41 y.o. female who presents today to follow-up for stable right adrenal adenoma, goiter.  She underwent sleeve gastrectomy for weight management in August 2021.  After losing 66 pounds prior to her last visit, she is returning with gain of 18 pounds.     - Her history starts in February 2016 when she underwent CT scan of her abdomen due to left lower quadrant pain which incidentally showed 2.7 cm mass  on her right adrenal gland which was reported to be consistent with adenoma. - Repeat CT abdomen in November 2018 revealed that the adenoma persisted and measured 3 cm ( HU 0) still favoring adenoma. -More recently on March 24, 2019, she underwent CT angiogram related to work-up in the hospital when she was admitted for COVID-19, the right adrenal adenoma was found to be measuring 3.2cm.  Her repeat previsit CT scan of abdomen reveals benign adrenal nodule, unchanged findings. -Previously ,she underwent hormonal work-up to determine functional status of this adenoma.  Findings have not been conclusive, except for one instance of 24-hour urine free cortisol marginally elevated at 59. -Her subsequent functional studies were within normal limits, did not require definitive intervention and was put on observation with plan to repeat plasma free metanephrines, A1c, and thyroid function tests.  These are all within normal limits.  -Before this visit, she underwent plasma free metanephrines which were normal.     She is known to have euthyroid goiter,  unchanged findings in previsit thyroid ultrasound. - She has 1 child, 30 years of age. - She denies diaphoresis, uncontrolled hypertension, headaches.  Review of Systems  Limited as above.  Objective:    BP 120/78   Pulse 68   Ht '5\' 4"'$  (1.626 m)   Wt 217 lb 6.4 oz (98.6 kg)   BMI 37.32 kg/m   Wt Readings from Last 3 Encounters:  01/01/22 217 lb 6.4 oz (98.6 kg)  12/08/21 210 lb 5.1 oz (95.4 kg)  09/04/21 211 lb (95.7 kg)    Physical Exam   CMP ( most recent) CMP     Component Value Date/Time   NA  140 12/09/2021 0449   NA 140 10/28/2021 0000   K 3.9 12/09/2021 0449   CL 115 (H) 12/09/2021 0449   CO2 21 (L) 12/09/2021 0449   GLUCOSE 113 (H) 12/09/2021 0449   BUN 15 12/09/2021 0449   BUN 14 10/28/2021 0000   CREATININE 0.77 12/09/2021 0449   CREATININE 0.98 07/18/2019 1505   CALCIUM 8.5 (L) 12/09/2021 0449   PROT 6.4 (L) 12/09/2021 0449   ALBUMIN 3.4 (L) 12/09/2021 0449   AST 53 (H) 12/09/2021 0449   ALT 64 (H) 12/09/2021 0449   ALKPHOS 47 12/09/2021 0449   BILITOT 0.5 12/09/2021 0449   GFRNONAA >60 12/09/2021 0449   GFRNONAA 64 04/18/2017 1041   GFRAA >60 02/07/2020 0457   GFRAA 74 04/18/2017 1041     Diabetic Labs (most recent): Lab Results  Component Value Date   HGBA1C 5.4 07/18/2019   HGBA1C 5.9 (H) 02/16/2019   HGBA1C 5.6 12/13/2018     Lipid Panel ( most recent) Lipid Panel     Component Value Date/Time   CHOL 147 05/24/2018 1133   TRIG 134 03/06/2019 2132   HDL 43 (L) 05/24/2018 1133   CHOLHDL 3.4 05/24/2018 1133   LDLCALC 84 05/24/2018 1133       Lab Results  Component Value Date   TSH 1.635 12/07/2021   TSH 0.75 10/28/2021   TSH 1.350 05/25/2021   TSH 2.620 12/13/2018   TSH 1.83 04/18/2017   FREET4 1.06 12/13/2018      Most recent 24-hour urine free cortisol on November 28, 2017 was 14 improving from 58.  05/10/2017   CT abdomen  Radiology description of her right adrenal mass: Adrenal: Previously noted right adrenal mass  is similar to the prior examination measuring 3.0 x 2.6 cm and is diffusely low-attenuation (0 HU), compatible with an adenoma. Left adrenal gland and bilateral kidneys are normal in appearance. No hydroureteronephrosis in the visualized portions of the abdomen.   March 24, 2019, 3.2 cm right adrenal adenoma redemonstrated.   Assessment & Plan:   1. Adrenal adenoma, right  -3.2cm stable adrenal adenoma.    Radiologic features favoring benign adenoma. - a series of functional studies have been normal-unremarkable.  Patient has difficulty losing weight, see below.  Her most recent overnight dexamethasone suppression test did not exclude Cushing syndrome.  She did have normal plasma metanephrines in 2019.   -Her prior endocrine work-up was negative for hormone excess no deficits.  She will be considered for a.m. cortisol and plasma metanephrines before her next visit.     2) morbid obesity -She is status post laparoscopic sleeve gastrectomy which allowed her to lose 66 pounds until last visit, returning with regain of 80 pounds.   She has transaminases, she is a good candidate for lifestyle medicine.  - she acknowledges that there is a room for improvement in her food and drink choices. - Suggestion is made for her to avoid simple carbohydrates  from her diet including Cakes, Sweet Desserts, Ice Cream, Soda (diet and regular), Sweet Tea, Candies, Chips, Cookies, Store Bought Juices, Alcohol , Artificial Sweeteners,  Coffee Creamer, and "Sugar-free" Products, Lemonade. This will help patient to have more stable blood glucose profile and potentially avoid unintended weight gain.  The following Lifestyle Medicine recommendations according to [place]  [facility]) were discussed and and offered to patient and she  agrees to start the journey:  A. Whole Foods, Plant-Based Nutrition comprising of fruits and vegetables, plant-based proteins, whole-grain  carbohydrates  was discussed in detail with the patient.   A list for source of those nutrients were also provided to the patient.  Patient will use only water or unsweetened tea for hydration. B.  The need to stay away from risky substances including alcohol, smoking; obtaining 7 to 9 hours of restorative sleep, at least 150 minutes of moderate intensity exercise weekly, the importance of healthy social connections,  and stress management techniques were discussed. C.  A full color page of  Calorie density of various food groups per pound showing examples of each food groups was provided to the patient.   3. Goiter - She does not have family history of thyroid cancer.  She continues to have stable thyroid ultrasound.  She has significant thyromegaly with no discrete nodules, euthyroid based on her thyroid function tests, no intervention at this time.  She will have repeat thyroid ultrasound and thyroid function test before her next visit.  4.  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   - I advised patient to maintain close follow up with Celene Squibb, MD for primary care needs.    I spent 32 minutes in the care of the patient today including review of labs from Thyroid Function, CMP, and other relevant labs ; imaging/biopsy records (current and previous including abstractions from other facilities); face-to-face time discussing  her lab results and symptoms, medications doses, her options of short and long term treatment based on the latest standards of care / guidelines;   and documenting the encounter.  Meta Hatchet Joines  participated in the discussions, expressed understanding, and voiced agreement with the above plans.  All questions were answered to her satisfaction. she is encouraged to contact clinic should she have any questions or concerns prior to her return visit.    Follow up plan: Return in about 3 months (around  04/03/2022) for F/U with Pre-visit Labs, A1c -NV.   Glade Lloyd, MD [facility] [address] [place], [place] [address] Phone: [phone]  Fax: [phone]     01/01/2022, 1:51 PM  This note was partially dictated with voice recognition software. Similar sounding words can be transcribed inadequately or may not  be corrected upon review.

## 2022-01-01 NOTE — Patient Instructions (Signed)
                                     Advice for Weight Management  -For most of us the best way to lose weight is by diet management. Generally speaking, diet management means consuming less calories intentionally which over time brings about progressive weight loss.  This can be achieved more effectively by avoiding ultra processed carbohydrates, processed meats, unhealthy fats.    It is critically important to know your numbers: how much calorie you are consuming and how much calorie you need. More importantly, our carbohydrates sources should be unprocessed naturally occurring  complex starch food items.  It is always important to balance nutrition also by  appropriate intake of proteins (mainly plant-based), healthy fats/oils, plenty of fruits and vegetables.   -The American College of Lifestyle Medicine (ACL M) recommends nutrition derived mostly from Whole Food, Plant Predominant Sources example an apple instead of applesauce or apple pie. Eat Plenty of vegetables, Mushrooms, fruits, Legumes, Whole Grains, Nuts, seeds in lieu of processed meats, processed snacks/pastries red meat, poultry, eggs.  Use only water or unsweetened tea for hydration.  The College also recommends the need to stay away from risky substances including alcohol, smoking; obtaining 7-9 hours of restorative sleep, at least 150 minutes of moderate intensity exercise weekly, importance of healthy social connections, and being mindful of stress and seek help when it is overwhelming.    -Sticking to a routine mealtime to eat 3 meals a day and avoiding unnecessary snacks is shown to have a big role in weight control. Under normal circumstances, the only time we burn stored energy is when we are hungry, so allow  some hunger to take place- hunger means no food between appropriate meal times, only water.  It is not advisable to starve.   -It is better to avoid simple carbohydrates including:  Cakes, Sweet Desserts, Ice Cream, Soda (diet and regular), Sweet Tea, Candies, Chips, Cookies, Store Bought Juices, Alcohol in Excess of  1-2 drinks a day, Lemonade,  Artificial Sweeteners, Doughnuts, Coffee Creamers, "Sugar-free" Products, etc, etc.  This is not a complete list.....    -Consulting with certified diabetes educators is proven to provide you with the most accurate and current information on diet.  Also, you may be  interested in discussing diet options/exchanges , we can schedule a visit with Paula King, RDN, CDE for individualized nutrition education.  -Exercise: If you are able: 30 -60 minutes a day ,4 days a week, or 150 minutes of moderate intensity exercise weekly.    The longer the better if tolerated.  Combine stretch, strength, and aerobic activities.  If you were told in the past that you have high risk for cardiovascular diseases, or if you are currently symptomatic, you may seek evaluation by your heart doctor prior to initiating moderate to intense exercise programs.                                  Additional Care Considerations for Diabetes/Prediabetes   -Diabetes  is a chronic disease.  The most important care consideration is regular follow-up with your diabetes care provider with the goal being avoiding or delaying its complications and to take advantage of advances in medications and technology.  If appropriate actions are taken early enough, type 2 diabetes can even be   reversed.  Seek information from the right source.  - Whole Food, Plant Predominant Nutrition is highly recommended: Eat Plenty of vegetables, Mushrooms, fruits, Legumes, Whole Grains, Nuts, seeds in lieu of processed meats, processed snacks/pastries red meat, poultry, eggs as recommended by American College of  Lifestyle Medicine (ACLM).  -Type 2 diabetes is known to coexist with other important comorbidities such as high blood pressure and high cholesterol.  It is critical to control not only the  diabetes but also the high blood pressure and high cholesterol to minimize and delay the risk of complications including coronary artery disease, stroke, amputations, blindness, etc.  The good news is that this diet recommendation for type 2 diabetes is also very helpful for managing high cholesterol and high blood blood pressure.  - Studies showed that people with diabetes will benefit from a class of medications known as ACE inhibitors and statins.  Unless there are specific reasons not to be on these medications, the standard of care is to consider getting one from these groups of medications at an optimal doses.  These medications are generally considered safe and proven to help protect the heart and the kidneys.    - People with diabetes are encouraged to initiate and maintain regular follow-up with eye doctors, foot doctors, dentists , and if necessary heart and kidney doctors.     - It is highly recommended that people with diabetes quit smoking or stay away from smoking, and get yearly  flu vaccine and pneumonia vaccine at least every 5 years.  See above for additional recommendations on exercise, sleep, stress management , and healthy social connections.      

## 2022-04-07 ENCOUNTER — Ambulatory Visit: Payer: Medicaid Other | Admitting: "Endocrinology

## 2022-04-14 ENCOUNTER — Encounter: Payer: Self-pay | Admitting: "Endocrinology

## 2022-06-09 LAB — T4, FREE: Free T4: 1.16 ng/dL (ref 0.82–1.77)

## 2022-06-09 LAB — COMPREHENSIVE METABOLIC PANEL
ALT: 23 IU/L (ref 0–32)
AST: 17 IU/L (ref 0–40)
Albumin/Globulin Ratio: 1.8 (ref 1.2–2.2)
Albumin: 4.2 g/dL (ref 3.9–4.9)
Alkaline Phosphatase: 61 IU/L (ref 44–121)
BUN/Creatinine Ratio: 14 (ref 9–23)
BUN: 13 mg/dL (ref 6–24)
Bilirubin Total: 0.3 mg/dL (ref 0.0–1.2)
CO2: 22 mmol/L (ref 20–29)
Calcium: 9.2 mg/dL (ref 8.7–10.2)
Chloride: 106 mmol/L (ref 96–106)
Creatinine, Ser: 0.9 mg/dL (ref 0.57–1.00)
Globulin, Total: 2.4 g/dL (ref 1.5–4.5)
Glucose: 82 mg/dL (ref 70–99)
Potassium: 4 mmol/L (ref 3.5–5.2)
Sodium: 141 mmol/L (ref 134–144)
Total Protein: 6.6 g/dL (ref 6.0–8.5)
eGFR: 82 mL/min/{1.73_m2} (ref >59)

## 2022-06-09 LAB — METANEPHRINES, PLASMA
Metanephrine, Free: <25 pg/mL (ref 0.0–88.0)
Normetanephrine, Free: 52.9 pg/mL (ref 0.0–218.9)

## 2022-06-09 LAB — CORTISOL-AM, BLOOD: Cortisol - AM: 10.2 ug/dL (ref 6.2–19.4)

## 2022-06-09 LAB — VITAMIN D 25 HYDROXY (VIT D DEFICIENCY, FRACTURES): Vit D, 25-Hydroxy: 101 ng/mL — ABNORMAL HIGH (ref 30.0–100.0)

## 2022-06-09 LAB — TSH: TSH: 2.09 u[IU]/mL (ref 0.450–4.500)

## 2022-06-09 LAB — VITAMIN B12: Vitamin B-12: 1431 pg/mL — ABNORMAL HIGH (ref 232–1245)

## 2022-07-02 ENCOUNTER — Ambulatory Visit (INDEPENDENT_AMBULATORY_CARE_PROVIDER_SITE_OTHER): Payer: Medicaid Other | Admitting: "Endocrinology

## 2022-07-02 ENCOUNTER — Encounter: Payer: Self-pay | Admitting: "Endocrinology

## 2022-07-02 VITALS — BP 100/64 | HR 88 | Ht 64.0 in | Wt 215.2 lb

## 2022-07-02 DIAGNOSIS — Z6837 Body mass index (BMI) 37.0-37.9, adult: Secondary | ICD-10-CM | POA: Diagnosis not present

## 2022-07-02 DIAGNOSIS — D3501 Benign neoplasm of right adrenal gland: Secondary | ICD-10-CM | POA: Diagnosis not present

## 2022-07-02 DIAGNOSIS — E049 Nontoxic goiter, unspecified: Secondary | ICD-10-CM

## 2022-07-02 NOTE — Progress Notes (Signed)
07/02/2022, 12:27 PM  Endocrinology follow-up note    Subjective:    Patient ID: Paula King, female    DOB: 1981-04-03, PCP Celene Squibb, MD   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[place])    Diverticulitis    GERD (gastroesophageal reflux disease)    Hypertension    Miscarriage    Obese    Pneumonia    Preeclampsia    Pulmonary embolism ([place])    Vaginal Pap smear, abnormal    Past Surgical History:  Procedure Laterality Date   CESAREAN SECTION     CHOLECYSTECTOMY     LAPAROSCOPIC GASTRIC SLEEVE RESECTION N/A 02/05/2020   Procedure: LAPAROSCOPIC SLEEVE GASTRECTOMY;  Surgeon: Clovis Riley, MD;  Location: [facility];  Service: General;  Laterality: N/A;   NASAL SEPTOPLASTY W/ TURBINOPLASTY N/A 07/27/2019   Procedure: NASAL SEPTOPLASTY WITH TURBINATE REDUCTION;  Surgeon: Leta Baptist, MD;  Location: [facility];  Service: ENT;  Laterality: N/A;   UPPER GI ENDOSCOPY N/A 02/05/2020   Procedure: UPPER GI ENDOSCOPY;  Surgeon: Clovis Riley, MD;  Location: [facility];  Service: General;  Laterality: N/A;   WISDOM TOOTH EXTRACTION     Social History   Socioeconomic History   Marital status: Single    Spouse name: Not on file   Number of children: 1   Years of education: Not on file   Highest education level: Some college, no degree  Occupational History    Employer: [employer]  Tobacco Use   Smoking status: Never   Smokeless tobacco: Never  Vaping Use   Vaping Use: Never used  Substance and Sexual Activity   Alcohol use: Yes    Comment: OCCASSIONAL   Drug use: No   Sexual activity: Not Currently    Birth control/protection: Coitus interruptus, None, I.U.D.  Other Topics Concern   Not on file  Social History Narrative   Lives in [place], [place].   Single, not dating.    Has one child, age 17, 24.   Has family in [place], [place].   Enjoys  swimming.   Enjoys time with family.    Eats all foods.   Wears seatbelt.    Social Determinants of Health   Financial Resource Strain: Low Risk  (02/10/2021)   Overall Financial Resource Strain (CARDIA)    Difficulty of Paying Living Expenses: Not very hard  Food Insecurity: No Food Insecurity (02/10/2021)   Hunger Vital Sign    Worried About Running Out of Food in the Last Year: Never true    Ran Out of Food in the Last Year: Never true  Transportation Needs: No Transportation Needs (02/10/2021)   PRAPARE - Hydrologist (Medical): No    Lack of Transportation (Non-Medical): No  Physical Activity: Insufficiently Active (02/10/2021)   Exercise Vital Sign    Days of Exercise per Week: 3 days    Minutes of Exercise per Session: 30 min  Stress: No Stress Concern Present (02/10/2021)   [place]    Feeling of Stress : Only a little  Social Connections: Socially Isolated (02/10/2021)  Social Licensed conveyancer [NHANES]    Frequency of Communication with Friends and Family: More than three times a week    Frequency of Social Gatherings with Friends and Family: Twice a week    Attends Religious Services: Never    Marine scientist or Organizations: No    Attends Archivist Meetings: Never    Marital Status: Never married   Outpatient Encounter Medications as of 07/02/2022  Medication Sig   Multiple Vitamins-Minerals (BARIATRIC MULTIVITAMINS/IRON PO) Take 1 tablet by mouth daily.   [DISCONTINUED] Calcium Carb-Cholecalciferol (CALCIUM 600+D3 PO) Take 1 tablet by mouth 3 (three) times daily.   [DISCONTINUED] cholecalciferol (VITAMIN D) 1000 units tablet Take 1,000 Units by mouth.    No facility-administered encounter medications on file as of 07/02/2022.   ALLERGIES: Allergies  Allergen Reactions   Ativan [Lorazepam] Other (See Comments)    Acute delirium.   Valium  [Diazepam] Other (See Comments)    Acute delirium.    Ciprofloxacin     IV Only-Patient started sweating and becoming clammy when was infused with IV cipro on 08/09/14. Can tolerate PO Ciprofloxacin     VACCINATION STATUS: Immunization History  Administered Date(s) Administered   Influenza,inj,Quad PF,6+ Mos 04/26/2019   Influenza-Unspecified 04/03/2020    HPI Paula King is 42 y.o. female who presents today to follow-up for stable right adrenal adenoma, goiter.  She underwent sleeve gastrectomy for weight management in August 2021.   She is still keeping of at least 40 pounds of weight she has achieved subsequent to her surgery.   - Her history starts in February 2016 when she underwent CT scan of her abdomen due to left lower quadrant pain which incidentally showed 2.7 cm mass  on her right adrenal gland which was reported to be consistent with adenoma. - Repeat CT abdomen in November 2018 revealed that the adenoma persisted and measured 3 cm ( HU 0) still favoring adenoma. -More recently on March 24, 2019, she underwent CT angiogram related to work-up in the hospital when she was admitted for COVID-19, the right adrenal adenoma was found to be measuring 3.2cm.  Her repeat previsit CT scan of abdomen reveals benign adrenal nodule, unchanged findings. -Previously ,she underwent hormonal work-up to determine functional status of this adenoma.  Findings have not been conclusive, except for one instance of 24-hour urine free cortisol marginally elevated at 59. -Her subsequent functional studies were within normal limits, did not require definitive intervention and was put on observation with plan to repeat plasma free metanephrines, A1c, a.m. cortisol, and thyroid function tests.  These are all within normal limits.  -Before this visit, she underwent plasma free metanephrines which were normal.     She is known to have euthyroid goiter, unchanged findings in thyroid ultrasound in  June 2023. - She has 1 child, 90 years of age. - She denies diaphoresis, uncontrolled hypertension, headaches.  Review of Systems  Limited as above.  Objective:    BP 100/64   Pulse 88   Ht '5\' 4"'$  (1.626 m)   Wt 215 lb 3.2 oz (97.6 kg)   BMI 36.94 kg/m   Wt Readings from Last 3 Encounters:  07/02/22 215 lb 3.2 oz (97.6 kg)  01/01/22 217 lb 6.4 oz (98.6 kg)  12/08/21 210 lb 5.1 oz (95.4 kg)    Physical Exam   CMP ( most recent) CMP     Component Value Date/Time   NA 141 05/28/2022 0842   K 4.0  05/28/2022 0842   CL 106 05/28/2022 0842   CO2 22 05/28/2022 0842   GLUCOSE 82 05/28/2022 0842   GLUCOSE 113 (H) 12/09/2021 0449   BUN 13 05/28/2022 0842   CREATININE 0.90 05/28/2022 0842   CREATININE 0.98 07/18/2019 1505   CALCIUM 9.2 05/28/2022 0842   PROT 6.6 05/28/2022 0842   ALBUMIN 4.2 05/28/2022 0842   AST 17 05/28/2022 0842   ALT 23 05/28/2022 0842   ALKPHOS 61 05/28/2022 0842   BILITOT 0.3 05/28/2022 0842   GFRNONAA >60 12/09/2021 0449   GFRNONAA 64 04/18/2017 1041   GFRAA >60 02/07/2020 0457   GFRAA 74 04/18/2017 1041     Diabetic Labs (most recent): Lab Results  Component Value Date   HGBA1C 5.4 07/18/2019   HGBA1C 5.9 (H) 02/16/2019   HGBA1C 5.6 12/13/2018     Lipid Panel ( most recent) Lipid Panel     Component Value Date/Time   CHOL 147 05/24/2018 1133   TRIG 134 03/06/2019 2132   HDL 43 (L) 05/24/2018 1133   CHOLHDL 3.4 05/24/2018 1133   LDLCALC 84 05/24/2018 1133       Lab Results  Component Value Date   TSH 2.090 05/28/2022   TSH 1.635 12/07/2021   TSH 0.75 10/28/2021   TSH 1.350 05/25/2021   TSH 2.620 12/13/2018   TSH 1.83 04/18/2017   FREET4 1.16 05/28/2022   FREET4 1.06 12/13/2018      Most recent 24-hour urine free cortisol on November 28, 2017 was 14 improving from 17.  05/10/2017   CT abdomen  Radiology description of her right adrenal mass: Adrenal: Previously noted right adrenal mass is similar to the prior examination  measuring 3.0 x 2.6 cm and is diffusely low-attenuation (0 HU), compatible with an adenoma. Left adrenal gland and bilateral kidneys are normal in appearance. No hydroureteronephrosis in the visualized portions of the abdomen.   March 24, 2019, 3.2 cm right adrenal adenoma redemonstrated.   Assessment & Plan:   1. Adrenal adenoma, right  -3.2cm stable adrenal adenoma.    Radiologic features favoring benign adenoma. - a series of functional studies have been normal-unremarkable.  Patient has difficulty losing weight, see below.  Her most recent overnight dexamethasone suppression test did not exclude Cushing syndrome.  She did have normal plasma metanephrines in 2019.   -Her prior endocrine work-up was negative for hormone excess no deficits.   Her a.m. cortisol and plasma metanephrines are normal previsit.  She will not need any intervention at this time.    2) morbid obesity -She is status post laparoscopic sleeve gastrectomy which allowed her to lose 66 pounds until last visit, she has kept off 40 lbs.  She has had transaminases, which has resolved after her engagement with  lifestyle medicine.  She remains a good candidate for LM. - she acknowledges that there is a room for improvement in her food and drink choices. - Suggestion is made for her to avoid simple carbohydrates  from her diet including Cakes, Sweet Desserts, Ice Cream, Soda (diet and regular), Sweet Tea, Candies, Chips, Cookies, Store Bought Juices, Alcohol , Artificial Sweeteners,  Coffee Creamer, and "Sugar-free" Products, Lemonade. This will help patient to have more stable blood glucose profile and potentially avoid unintended weight gain.  The following Lifestyle Medicine recommendations according to [place]  [facility]) were discussed and and offered to patient and she  agrees to start the journey:  A. Whole Foods, Plant-Based Nutrition comprising of fruits and  vegetables, plant-based  proteins, whole-grain carbohydrates was discussed in detail with the patient.   A list for source of those nutrients were also provided to the patient.  Patient will use only water or unsweetened tea for hydration. B.  The need to stay away from risky substances including alcohol, smoking; obtaining 7 to 9 hours of restorative sleep, at least 150 minutes of moderate intensity exercise weekly, the importance of healthy social connections,  and stress management techniques were discussed. C.  A full color page of  Calorie density of various food groups per pound showing examples of each food groups was provided to the patient.     3. Goiter - She does not have family history of thyroid cancer.  She continues to have stable thyroid ultrasound.  She has significant thyromegaly with no discrete nodules, euthyroid based on her thyroid function tests, no intervention at this time.  She will  not need intervention for MNG with euthyroid state.   4.  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   - I advised patient to maintain close follow up with Celene Squibb, MD for primary care needs.    I spent 21 minutes in the care of the patient today including review of labs from Thyroid Function, CMP, and other relevant labs ; imaging/biopsy records (current and previous including abstractions from other facilities); face-to-face time discussing  her lab results and symptoms, medications doses, her options of short and long term treatment based on the latest standards of care / guidelines;   and documenting the encounter.  Paula King  participated in the discussions, expressed understanding, and voiced agreement with the above plans.  All questions were answered to her satisfaction. she is encouraged to contact clinic should she have any questions or concerns prior to her return visit.   Follow up  plan: Return for Fasting Labs  in AM B4 8.   Glade Lloyd, MD [facility] [address] [place], [place] [address] Phone: [phone]  Fax: [phone]     07/02/2022, 12:27 PM  This note was partially dictated with voice recognition software. Similar sounding words can be transcribed inadequately or may not  be corrected upon review.

## 2022-07-02 NOTE — Patient Instructions (Signed)

## 2022-07-19 ENCOUNTER — Ambulatory Visit (INDEPENDENT_AMBULATORY_CARE_PROVIDER_SITE_OTHER): Payer: Medicaid - Out of State | Admitting: Adult Health

## 2022-07-19 ENCOUNTER — Encounter: Payer: Self-pay | Admitting: Adult Health

## 2022-07-19 VITALS — BP 129/93 | HR 68 | Temp 98.6°F | Ht 64.0 in | Wt 216.0 lb

## 2022-07-19 DIAGNOSIS — N920 Excessive and frequent menstruation with regular cycle: Secondary | ICD-10-CM | POA: Diagnosis not present

## 2022-07-19 DIAGNOSIS — Z3202 Encounter for pregnancy test, result negative: Secondary | ICD-10-CM

## 2022-07-19 DIAGNOSIS — R350 Frequency of micturition: Secondary | ICD-10-CM | POA: Diagnosis not present

## 2022-07-19 DIAGNOSIS — R110 Nausea: Secondary | ICD-10-CM | POA: Insufficient documentation

## 2022-07-19 DIAGNOSIS — R102 Pelvic and perineal pain: Secondary | ICD-10-CM

## 2022-07-19 DIAGNOSIS — N941 Unspecified dyspareunia: Secondary | ICD-10-CM

## 2022-07-19 LAB — POCT URINALYSIS DIPSTICK
Blood, UA: NEGATIVE
Glucose, UA: NEGATIVE
Ketones, UA: NEGATIVE
Leukocytes, UA: NEGATIVE
Nitrite, UA: NEGATIVE
Protein, UA: NEGATIVE

## 2022-07-19 LAB — POCT URINE PREGNANCY: Preg Test, Ur: NEGATIVE

## 2022-07-19 MED ORDER — PHENAZOPYRIDINE HCL 200 MG PO TABS: 200.0000 mg | ORAL_TABLET | Freq: Three times a day (TID) | ORAL | 0 refills | Status: DC | PRN

## 2022-07-19 MED ORDER — CEPHALEXIN 500 MG PO CAPS: 500.0000 mg | ORAL_CAPSULE | Freq: Three times a day (TID) | ORAL | 0 refills | Status: AC

## 2022-07-19 NOTE — Progress Notes (Signed)
  Subjective:     Patient ID: Paula King, female   DOB: [birth year], 42 y.o.   MRN: [id number]  HPI Paula King is a 42 year old white female,with SO, [id number], in complaining of Urinary frequency,nausea, pain with sex, spotting and pressure after peeing.  PCP none currently  Review of Systems Has urinary frequency for 4 days +nausea Pain with sex Spotting +pressure  Reviewed past medical,surgical, social and family history. Reviewed medications and allergies.     Objective:   Physical Exam BP (!) 129/93 (BP Location: Right Arm, Patient Position: Sitting, Cuff Size: Normal)   Pulse 68   Temp 98.6 F (37 C)   Ht '5\' 4"'$  (1.626 m)   Wt 216 lb (98 kg)   LMP 07/11/2022   BMI 37.08 kg/m  Urine dipstick was negative. UPT is negative.   Skin warm and dry.Pelvic: external genitalia is normal in appearance no lesions, vagina: white discharge without odor,urethra has no lesions or masses noted, cervix:smooth, no CMT, uterus: normal size, shape and contour, mildly tender, no masses felt, adnexa: no masses or tenderness noted. Bladder is +mildly tender and no masses felt. No CVAT. Examination chaperoned by Paula Pupa LPN Fall risk is low  Upstream - 07/19/22 1624       Pregnancy Intention Screening   Does the patient want to become pregnant in the next year? No    Does the patient's partner want to become pregnant in the next year? No    Would the patient like to discuss contraceptive options today? No      Contraception Wrap Up   Current Method Withdrawal or Other Method    End Method Withdrawal or Other Method             Assessment:     1. Urinary frequency Urine dipstick negtive UA C&S sent Will rx pyridium and keflex  Meds ordered this encounter  Medications   phenazopyridine (PYRIDIUM) 200 MG tablet    Sig: Take 1 tablet (200 mg total) by mouth 3 (three) times daily as needed for pain.    Dispense:  10 tablet    Refill:  0    Order Specific Question:   Supervising  Provider    Answer:   Tania Ade H [[id number]]   cephALEXin (KEFLEX) 500 MG capsule    Sig: Take 1 capsule (500 mg total) by mouth 3 (three) times daily for 7 days.    Dispense:  21 capsule    Refill:  0    Order Specific Question:   Supervising Provider    Answer:   Tania Ade H [[id number]]   Push fluids   2. Nausea She says she has meds at home  3. Dyspareunia, female  4. Spotting UPT was negative  5. Pelvic pressure  Feels pressure after peeing    UA &S sent  6. Negative pregnancy test   Plan:    Will message on Mychart when urine back   Return in 2 months for pap

## 2022-07-21 LAB — URINALYSIS, ROUTINE W REFLEX MICROSCOPIC
Bilirubin, UA: NEGATIVE
Glucose, UA: NEGATIVE
Ketones, UA: NEGATIVE
Leukocytes,UA: NEGATIVE
Nitrite, UA: NEGATIVE
Protein,UA: NEGATIVE
RBC, UA: NEGATIVE
Specific Gravity, UA: 1.01 (ref 1.005–1.030)
Urobilinogen, Ur: 0.2 mg/dL (ref 0.2–1.0)
pH, UA: 6 (ref 5.0–7.5)

## 2022-07-22 LAB — URINE CULTURE

## 2022-09-03 ENCOUNTER — Encounter (HOSPITAL_COMMUNITY): Payer: Self-pay | Admitting: *Deleted

## 2023-01-07 ENCOUNTER — Ambulatory Visit: Payer: Medicaid Other | Admitting: "Endocrinology

## 2023-02-21 ENCOUNTER — Telehealth: Payer: Self-pay | Admitting: "Endocrinology

## 2023-02-21 NOTE — Telephone Encounter (Signed)
Scheduled pt for next routine visit opening.  Sent pt my chart message to let her know.

## 2023-02-21 NOTE — Telephone Encounter (Signed)
Pt has requested an appt after lab work, Do you want me to put her in the next available slot?

## 2023-03-30 ENCOUNTER — Ambulatory Visit (INDEPENDENT_AMBULATORY_CARE_PROVIDER_SITE_OTHER): Payer: Medicaid - Out of State | Admitting: "Endocrinology

## 2023-03-30 ENCOUNTER — Encounter: Payer: Self-pay | Admitting: "Endocrinology

## 2023-03-30 VITALS — BP 100/80 | HR 80 | Ht 64.0 in | Wt 212.8 lb

## 2023-03-30 DIAGNOSIS — E049 Nontoxic goiter, unspecified: Secondary | ICD-10-CM

## 2023-03-30 DIAGNOSIS — E66812 Obesity, class 2: Secondary | ICD-10-CM | POA: Diagnosis not present

## 2023-03-30 DIAGNOSIS — Z6837 Body mass index (BMI) 37.0-37.9, adult: Secondary | ICD-10-CM

## 2023-03-30 DIAGNOSIS — D3501 Benign neoplasm of right adrenal gland: Secondary | ICD-10-CM

## 2023-03-30 MED ORDER — WEGOVY 0.25 MG/0.5ML ~~LOC~~ SOAJ: 0.2500 mg | SUBCUTANEOUS | 0 refills | Status: DC

## 2023-03-30 NOTE — Progress Notes (Signed)
03/30/2023, 12:09 PM  Endocrinology follow-up note   Subjective:    Patient ID: Paula King, female    DOB: Apr 19, 1981, PCP Pcp, No   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HCC)    Diverticulitis    GERD (gastroesophageal reflux disease)    Hypertension    Miscarriage    Obese    Pneumonia    Preeclampsia    Pulmonary embolism (HCC)    Vaginal Pap smear, abnormal    Past Surgical History:  Procedure Laterality Date   CESAREAN SECTION     CHOLECYSTECTOMY     LAPAROSCOPIC GASTRIC SLEEVE RESECTION N/A 02/05/2020   Procedure: LAPAROSCOPIC SLEEVE GASTRECTOMY;  Surgeon: Berna Bue, MD;  Location: [facility];  Service: General;  Laterality: N/A;   NASAL SEPTOPLASTY W/ TURBINOPLASTY N/A 07/27/2019   Procedure: NASAL SEPTOPLASTY WITH TURBINATE REDUCTION;  Surgeon: Newman Pies, MD;  Location: [facility];  Service: ENT;  Laterality: N/A;   UPPER GI ENDOSCOPY N/A 02/05/2020   Procedure: UPPER GI ENDOSCOPY;  Surgeon: Berna Bue, MD;  Location: [facility];  Service: General;  Laterality: N/A;   WISDOM TOOTH EXTRACTION     Social History   Socioeconomic History   Marital status: Significant Other    Spouse name: Not on file   Number of children: 1   Years of education: Not on file   Highest education level: Some college, no degree  Occupational History    Employer: [employer]  Tobacco Use   Smoking status: Never   Smokeless tobacco: Never  Vaping Use   Vaping status: Never Used  Substance and Sexual Activity   Alcohol use: Yes    Comment: OCCASSIONAL   Drug use: No   Sexual activity: Yes    Birth control/protection: Coitus interruptus  Other Topics Concern   Not on file  Social History Narrative   Lives in [place], [place].   Single, not dating.    Has one child, age 82, 75.   Has family in [place], [place].   Enjoys swimming.   Enjoys time  with family.    Eats all foods.   Wears seatbelt.    Social Determinants of Health   Financial Resource Strain: Low Risk  (02/10/2021)   Overall Financial Resource Strain (CARDIA)    Difficulty of Paying Living Expenses: Not very hard  Food Insecurity: No Food Insecurity (02/10/2021)   Hunger Vital Sign    Worried About Running Out of Food in the Last Year: Never true    Ran Out of Food in the Last Year: Never true  Transportation Needs: No Transportation Needs (02/10/2021)   PRAPARE - Administrator, Civil Service (Medical): No    Lack of Transportation (Non-Medical): No  Physical Activity: Insufficiently Active (02/10/2021)   Exercise Vital Sign    Days of Exercise per Week: 3 days    Minutes of Exercise per Session: 30 min  Stress: No Stress Concern Present (02/10/2021)   [employer] of Occupational Health - Occupational Stress Questionnaire    Feeling of Stress : Only a little  Social Connections: Socially Isolated (02/10/2021)   Social Connection and Isolation Panel [  NHANES]    Frequency of Communication with Friends and Family: More than three times a week    Frequency of Social Gatherings with Friends and Family: Twice a week    Attends Religious Services: Never    Database administrator or Organizations: No    Attends Banker Meetings: Never    Marital Status: Never married   Outpatient Encounter Medications as of 03/30/2023  Medication Sig   Calcium Carbonate (CALCIUM 500 PO) Take 1 tablet by mouth 3 (three) times daily.   Semaglutide-Weight Management (WEGOVY) 0.25 MG/0.5ML SOAJ Inject 0.25 mg into the skin once a week.   Loratadine (CLARITIN PO) Take by mouth.   Multiple Vitamins-Minerals (BARIATRIC MULTIVITAMINS/IRON PO) Take 1 tablet by mouth daily.   [DISCONTINUED] phenazopyridine (PYRIDIUM) 200 MG tablet Take 1 tablet (200 mg total) by mouth 3 (three) times daily as needed for pain.   No facility-administered encounter medications on file  as of 03/30/2023.   ALLERGIES: Allergies  Allergen Reactions   Ativan [Lorazepam] Other (See Comments)    Acute delirium.   Valium [Diazepam] Other (See Comments)    Acute delirium.    Ciprofloxacin     IV Only-Patient started sweating and becoming clammy when was infused with IV cipro on 08/09/14. Can tolerate PO Ciprofloxacin     VACCINATION STATUS: Immunization History  Administered Date(s) Administered   Influenza,inj,Quad PF,6+ Mos 04/26/2019   Influenza-Unspecified 04/03/2020    HPI Paula King is 42 y.o. female who presents today to follow-up for stable right adrenal adenoma, goiter.  She underwent sleeve gastrectomy for weight management in August 2021.  She is here to follow-up on her right adrenal adenoma, goiter. She is still keeping off at least 40 pounds of weight she has achieved subsequent to her surgery. She is trying to engage in some lifestyle measures to lose weight, however could not achieve significant weight loss recently.  She wishes to achieve some more weight loss.  - Her history starts in February 2016 when she underwent CT scan of her abdomen due to left lower quadrant pain which incidentally showed 2.7 cm mass  on her right adrenal gland which was reported to be consistent with adenoma. - Repeat CT abdomen in November 2018 revealed that the adenoma persisted and measured 3 cm ( HU 0) still favoring adenoma.  -More recently on March 24, 2019, she underwent CT angiogram related to work-up in the hospital when she was admitted for COVID-19, the right adrenal adenoma was found to be measuring 3.2cm.  Her repeat previsit CT scan of abdomen reveals benign adrenal nodule, unchanged findings. -She did not have recent abdominal imaging studies. -Previously ,she underwent hormonal work-up to determine functional status of this adenoma.    Findings have not been conclusive, except for one instance of 24-hour urine free cortisol marginally elevated at  59. -Her subsequent functional studies were within normal limits, did not require definitive intervention and was put on observation with plan to repeat plasma free metanephrines, A1c, a.m. cortisol, and thyroid function tests.  These are all within normal limits. She presents with normal plasma metanephrines and normal a.m. cortisol.   She is known to have euthyroid goiter, unchanged findings in thyroid ultrasound in June 2023.  She presented with some dysphagia. - She is a mother of 1 child.   - She denies diaphoresis, uncontrolled hypertension, headaches.  Review of Systems  Limited as above.  Objective:    BP 100/80   Pulse 80  Ht 5\' 4"  (1.626 m)   Wt 212 lb 12.8 oz (96.5 kg)   BMI 36.53 kg/m   Wt Readings from Last 3 Encounters:  03/30/23 212 lb 12.8 oz (96.5 kg)  07/19/22 216 lb (98 kg)  07/02/22 215 lb 3.2 oz (97.6 kg)    Physical Exam   CMP ( most recent) CMP     Component Value Date/Time   NA 141 01/21/2023 0821   K 3.9 01/21/2023 0821   CL 104 01/21/2023 0821   CO2 22 01/21/2023 0821   GLUCOSE 88 01/21/2023 0821   GLUCOSE 113 (H) 12/09/2021 0449   BUN 10 01/21/2023 0821   CREATININE 0.99 01/21/2023 0821   CREATININE 0.98 07/18/2019 1505   CALCIUM 9.8 01/21/2023 0821   PROT 7.1 01/21/2023 0821   ALBUMIN 4.5 01/21/2023 0821   AST 19 01/21/2023 0821   ALT 24 01/21/2023 0821   ALKPHOS 67 01/21/2023 0821   BILITOT 0.4 01/21/2023 0821   GFRNONAA >60 12/09/2021 0449   GFRNONAA 64 04/18/2017 1041   GFRAA >60 02/07/2020 0457   GFRAA 74 04/18/2017 1041     Diabetic Labs (most recent): Lab Results  Component Value Date   HGBA1C 5.4 07/18/2019   HGBA1C 5.9 (H) 02/16/2019   HGBA1C 5.6 12/13/2018     Lipid Panel ( most recent) Lipid Panel     Component Value Date/Time   CHOL 148 01/21/2023 0821   TRIG 113 01/21/2023 0821   HDL 52 01/21/2023 0821   CHOLHDL 2.8 01/21/2023 0821   CHOLHDL 3.4 05/24/2018 1133   LDLCALC 76 01/21/2023 0821   LDLCALC 84  05/24/2018 1133       Lab Results  Component Value Date   TSH 2.090 05/28/2022   TSH 1.635 12/07/2021   TSH 0.75 10/28/2021   TSH 1.350 05/25/2021   TSH 2.620 12/13/2018   TSH 1.83 04/18/2017   FREET4 1.16 05/28/2022   FREET4 1.06 12/13/2018      Most recent 24-hour urine free cortisol on November 28, 2017 was 14 improving from 59.  05/10/2017   CT abdomen  Radiology description of her right adrenal mass: Adrenal: Previously noted right adrenal mass is similar to the prior examination measuring 3.0 x 2.6 cm and is diffusely low-attenuation (0 HU), compatible with an adenoma. Left adrenal gland and bilateral kidneys are normal in appearance. No hydroureteronephrosis in the visualized portions of the abdomen.   March 24, 2019, 3.2 cm right adrenal adenoma redemonstrated.   Assessment & Plan:   1. Adrenal adenoma, right  -3.2cm stable adrenal adenoma.    Radiologic features favoring benign adenoma. - a series of functional studies have been normal-unremarkable.  Patient has difficulty losing weight, see below.  Her most recent overnight dexamethasone suppression test did not exclude Cushing syndrome.  She did have normal plasma metanephrines in 2019.   -Her previsit labs as well as her prior endocrine workup is negative for functional excess from this nodule.  She will be considered for repeat CT scan of abdomen/pelvis with and without contrast before her next visit.   2) morbid obesity -She is status post laparoscopic sleeve gastrectomy which allowed her to lose 66 pounds until last visit, she has kept off 40 lbs.  She has had transaminases, which has resolved after her engagement with  lifestyle medicine. -She wishes to achieve some more weight loss.  She may benefit from a GLP-1 receptor agonists.  I discussed and initiated Wegovy 0.25 mg subcutaneously weekly.  This medication will be  advanced as she tolerates. She remains a good candidate for LM. - she acknowledges  that there is a room for improvement in her food and drink choices. - Suggestion is made for her to avoid simple carbohydrates  from her diet including Cakes, Sweet Desserts, Ice Cream, Soda (diet and regular), Sweet Tea, Candies, Chips, Cookies, Store Bought Juices, Alcohol , Artificial Sweeteners,  Coffee Creamer, and "Sugar-free" Products, Lemonade. This will help patient to have more stable blood glucose profile and potentially avoid unintended weight gain.  The following Lifestyle Medicine recommendations according to American College of Lifestyle Medicine  [facility]) were discussed and and offered to patient and she  agrees to start the journey:  A. Whole Foods, Plant-Based Nutrition comprising of fruits and vegetables, plant-based proteins, whole-grain carbohydrates was discussed in detail with the patient.   A list for source of those nutrients were also provided to the patient.  Patient will use only water or unsweetened tea for hydration. B.  The need to stay away from risky substances including alcohol, smoking; obtaining 7 to 9 hours of restorative sleep, at least 150 minutes of moderate intensity exercise weekly, the importance of healthy social connections,  and stress management techniques were discussed. C.  A full color page of  Calorie density of various food groups per pound showing examples of each food groups was provided to the patient.    3. Goiter - She does not have family history of thyroid cancer.  She continues to have stable thyroid ultrasound, presents with some dysphagia.  She will be offered follow-up/surveillance thyroid ultrasound. Her recent labs are consistent with euthyroid presentation.  She would not need initiation of thyroid hormone.   4.  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   - I advised patient to maintain close follow up with Pcp, No for primary care  needs.   I spent  22  minutes in the care of the patient today including review of labs from Thyroid Function, CMP, and other relevant labs ; imaging/biopsy records (current and previous including abstractions from other facilities); face-to-face time discussing  her lab results and symptoms, medications doses, her options of short and long term treatment based on the latest standards of care / guidelines;   and documenting the encounter.  Claudina Lick Ebling  participated in the discussions, expressed understanding, and voiced agreement with the above plans.  All questions were answered to her satisfaction. she is encouraged to contact clinic should she have any questions or concerns prior to her return visit.   Follow up plan: Return in about 3 months (around 06/30/2023), or CT Abdomen, Pelvis, for Thyroid / Neck Ultrasound.   Marquis Lunch, MD [facility] [address] [place], [place] [address] Phone: [phone]  Fax: [phone]     03/30/2023, 12:09 PM  This note was partially dictated with voice recognition software. Similar sounding words can be transcribed inadequately or may not  be corrected upon review.

## 2023-04-19 ENCOUNTER — Other Ambulatory Visit: Payer: Self-pay | Admitting: "Endocrinology

## 2023-04-19 ENCOUNTER — Ambulatory Visit (HOSPITAL_COMMUNITY): Payer: Medicaid - Out of State

## 2023-04-19 ENCOUNTER — Encounter: Payer: Self-pay | Admitting: "Endocrinology

## 2023-04-19 DIAGNOSIS — E8881 Metabolic syndrome: Secondary | ICD-10-CM

## 2023-04-22 ENCOUNTER — Ambulatory Visit (HOSPITAL_COMMUNITY)
Admission: RE | Admit: 2023-04-22 | Discharge: 2023-04-22 | Disposition: A | Discharge status: Home / Self Care | Payer: Medicaid - Out of State | Source: Ambulatory Visit | Attending: "Endocrinology | Admitting: "Endocrinology

## 2023-04-22 DIAGNOSIS — E049 Nontoxic goiter, unspecified: Secondary | ICD-10-CM | POA: Diagnosis present

## 2023-04-23 LAB — TSH: TSH: 0.77 u[IU]/mL (ref 0.450–4.500)

## 2023-04-23 LAB — HEMOGLOBIN A1C
Est. average glucose Bld gHb Est-mCnc: 100 mg/dL
Hgb A1c MFr Bld: 5.1 % (ref 4.8–5.6)

## 2023-04-25 ENCOUNTER — Other Ambulatory Visit (HOSPITAL_COMMUNITY): Payer: Self-pay

## 2023-04-29 ENCOUNTER — Ambulatory Visit (HOSPITAL_COMMUNITY): Admission: RE | Admit: 2023-04-29 | Payer: Medicaid - Out of State | Source: Ambulatory Visit

## 2023-04-29 ENCOUNTER — Encounter (HOSPITAL_COMMUNITY): Payer: Self-pay

## 2023-05-02 ENCOUNTER — Telehealth: Payer: Self-pay | Admitting: Adult Health

## 2023-05-02 ENCOUNTER — Other Ambulatory Visit: Payer: Self-pay | Admitting: Adult Health

## 2023-05-02 DIAGNOSIS — Z3201 Encounter for pregnancy test, result positive: Secondary | ICD-10-CM

## 2023-05-02 MED ORDER — PROGESTERONE 200 MG PO CAPS: ORAL_CAPSULE | ORAL | 3 refills | Status: DC

## 2023-05-02 NOTE — Telephone Encounter (Signed)
Left message to call me about labs and wegovy

## 2023-05-02 NOTE — Telephone Encounter (Signed)
Had +HPT Will check QHCG and progesterone level. Rx sent for Prometrium 200 mg in vagina at bedtime She is not taking wegovy.

## 2023-05-04 ENCOUNTER — Other Ambulatory Visit: Payer: Self-pay | Admitting: Adult Health

## 2023-05-04 DIAGNOSIS — Z3201 Encounter for pregnancy test, result positive: Secondary | ICD-10-CM

## 2023-05-04 LAB — BETA HCG QUANT (REF LAB): hCG Quant: 33 m[IU]/mL

## 2023-05-04 LAB — PROGESTERONE: Progesterone: 16.7 ng/mL

## 2023-05-06 ENCOUNTER — Other Ambulatory Visit: Payer: Self-pay | Admitting: Adult Health

## 2023-05-06 DIAGNOSIS — Z3201 Encounter for pregnancy test, result positive: Secondary | ICD-10-CM

## 2023-05-06 LAB — BETA HCG QUANT (REF LAB): hCG Quant: 92 m[IU]/mL

## 2023-05-10 LAB — BETA HCG QUANT (REF LAB): hCG Quant: 776 m[IU]/mL

## 2023-05-13 ENCOUNTER — Telehealth: Payer: Self-pay

## 2023-05-13 NOTE — Telephone Encounter (Signed)
I called patient in regards to a insurance denied services due to out of network with insurance .

## 2023-05-16 ENCOUNTER — Telehealth: Payer: Self-pay | Admitting: Adult Health

## 2023-05-16 DIAGNOSIS — Z3201 Encounter for pregnancy test, result positive: Secondary | ICD-10-CM

## 2023-05-16 NOTE — Telephone Encounter (Signed)
[facility] at [facility] called stating that patient came by this morning to have her hcg levels checked but they didn't have any orders.

## 2023-05-16 NOTE — Addendum Note (Signed)
Addended by: Cyril Mourning A on: 05/16/2023 04:37 PM   Modules accepted: Orders

## 2023-05-16 NOTE — Telephone Encounter (Signed)
Left message that order is in, can go in am

## 2023-05-18 ENCOUNTER — Other Ambulatory Visit: Payer: Self-pay | Admitting: Adult Health

## 2023-05-18 DIAGNOSIS — O3680X0 Pregnancy with inconclusive fetal viability, not applicable or unspecified: Secondary | ICD-10-CM

## 2023-05-18 LAB — BETA HCG QUANT (REF LAB): hCG Quant: 3180 m[IU]/mL

## 2023-05-18 NOTE — Progress Notes (Signed)
Dating [name] order  at [facility] sent

## 2023-05-27 ENCOUNTER — Ambulatory Visit (HOSPITAL_COMMUNITY)
Admission: RE | Admit: 2023-05-27 | Discharge: 2023-05-27 | Disposition: A | Discharge status: Home / Self Care | Payer: Medicaid - Out of State | Source: Ambulatory Visit | Attending: Adult Health | Admitting: Adult Health

## 2023-05-27 DIAGNOSIS — O3680X0 Pregnancy with inconclusive fetal viability, not applicable or unspecified: Secondary | ICD-10-CM | POA: Insufficient documentation

## 2023-08-01 ENCOUNTER — Ambulatory Visit: Payer: Medicaid - Out of State | Admitting: "Endocrinology

## 2023-09-05 ENCOUNTER — Encounter: Payer: Self-pay | Admitting: "Endocrinology

## 2023-09-08 ENCOUNTER — Other Ambulatory Visit: Payer: Self-pay | Admitting: "Endocrinology

## 2023-09-08 ENCOUNTER — Ambulatory Visit (HOSPITAL_COMMUNITY): Admission: RE | Admit: 2023-09-08 | Payer: Medicaid - Out of State | Source: Ambulatory Visit

## 2023-09-08 ENCOUNTER — Encounter (HOSPITAL_COMMUNITY): Payer: Self-pay | Admitting: *Deleted

## 2023-09-08 ENCOUNTER — Encounter (HOSPITAL_COMMUNITY): Payer: Self-pay

## 2023-09-23 ENCOUNTER — Other Ambulatory Visit: Payer: Self-pay | Admitting: "Endocrinology

## 2023-09-23 DIAGNOSIS — D3501 Benign neoplasm of right adrenal gland: Secondary | ICD-10-CM

## 2023-09-26 NOTE — Telephone Encounter (Signed)
 It was just faxed over to [name] that the CT Abdomen was approved.

## 2023-10-25 ENCOUNTER — Encounter: Payer: Self-pay | Admitting: "Endocrinology

## 2023-10-27 HISTORY — PX: NASOPHARYNGOSCOPY EUSTATION TUBE BALLOON DILATION: SHX6729

## 2023-11-09 ENCOUNTER — Encounter: Payer: Self-pay | Admitting: "Endocrinology

## 2023-11-11 ENCOUNTER — Ambulatory Visit: Payer: Medicaid - Out of State | Admitting: "Endocrinology

## 2023-11-20 ENCOUNTER — Telehealth: Payer: Medicaid - Out of State | Admitting: Family Medicine

## 2023-11-20 DIAGNOSIS — B9689 Other specified bacterial agents as the cause of diseases classified elsewhere: Secondary | ICD-10-CM

## 2023-11-20 DIAGNOSIS — J019 Acute sinusitis, unspecified: Secondary | ICD-10-CM

## 2023-11-20 MED ORDER — AMOXICILLIN-POT CLAVULANATE 875-125 MG PO TABS: 1.0000 | ORAL_TABLET | Freq: Two times a day (BID) | ORAL | 0 refills | Status: DC

## 2023-11-20 NOTE — Progress Notes (Signed)

## 2023-11-28 ENCOUNTER — Ambulatory Visit (INDEPENDENT_AMBULATORY_CARE_PROVIDER_SITE_OTHER): Payer: Medicaid - Out of State | Admitting: "Endocrinology

## 2023-11-28 ENCOUNTER — Encounter: Payer: Self-pay | Admitting: "Endocrinology

## 2023-11-28 VITALS — BP 106/74 | HR 88 | Ht 64.0 in | Wt 218.6 lb

## 2023-11-28 DIAGNOSIS — D3501 Benign neoplasm of right adrenal gland: Secondary | ICD-10-CM | POA: Diagnosis not present

## 2023-11-28 DIAGNOSIS — E8881 Metabolic syndrome: Secondary | ICD-10-CM | POA: Diagnosis not present

## 2023-11-28 NOTE — Progress Notes (Signed)
 11/28/2023, 6:50 PM  Endocrinology follow-up note   Subjective:    Patient ID: Paula King, female    DOB: 08-Jul-1980, PCP Pcp, No   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HCC)    Diverticulitis    GERD (gastroesophageal reflux disease)    Hypertension    Miscarriage    Obese    Pneumonia    Preeclampsia    Pulmonary embolism (HCC)    Vaginal Pap smear, abnormal    Past Surgical History:  Procedure Laterality Date   CESAREAN SECTION     CHOLECYSTECTOMY     LAPAROSCOPIC GASTRIC SLEEVE RESECTION N/A 02/05/2020   Procedure: LAPAROSCOPIC SLEEVE GASTRECTOMY;  Surgeon: Adalberto Acton, MD;  Location: [facility];  Service: General;  Laterality: N/A;   NASAL SEPTOPLASTY W/ TURBINOPLASTY N/A 07/27/2019   Procedure: NASAL SEPTOPLASTY WITH TURBINATE REDUCTION;  Surgeon: Reynold Caves, MD;  Location: [facility];  Service: ENT;  Laterality: N/A;   NASOPHARYNGOSCOPY EUSTATION TUBE BALLOON DILATION Left 10/2023   UPPER GI ENDOSCOPY N/A 02/05/2020   Procedure: UPPER GI ENDOSCOPY;  Surgeon: Adalberto Acton, MD;  Location: [facility];  Service: General;  Laterality: N/A;   WISDOM TOOTH EXTRACTION     Social History   Socioeconomic History   Marital status: Significant Other    Spouse name: Not on file   Number of children: 1   Years of education: Not on file   Highest education level: Some college, no degree  Occupational History    Employer: [employer]  Tobacco Use   Smoking status: Never   Smokeless tobacco: Never  Vaping Use   Vaping status: Never Used  Substance and Sexual Activity   Alcohol use: Yes    Comment: OCCASSIONAL   Drug use: No   Sexual activity: Yes    Birth control/protection: Coitus interruptus  Other Topics Concern   Not on file  Social History Narrative   Lives in [place], [place].   Single, not dating.    Has one child, age 54, 62.    Has family in [place], [place].   Enjoys swimming.   Enjoys time with family.    Eats all foods.   Wears seatbelt.    Social Drivers of Corporate investment banker Strain: Low Risk  (02/10/2021)   Overall Financial Resource Strain (CARDIA)    Difficulty of Paying Living Expenses: Not very hard  Food Insecurity: No Food Insecurity (02/10/2021)   Hunger Vital Sign    Worried About Running Out of Food in the Last Year: Never true    Ran Out of Food in the Last Year: Never true  Transportation Needs: No Transportation Needs (02/10/2021)   PRAPARE - Administrator, Civil Service (Medical): No    Lack of Transportation (Non-Medical): No  Physical Activity: Insufficiently Active (02/10/2021)   Exercise Vital Sign    Days of Exercise per Week: 3 days    Minutes of Exercise per Session: 30 min  Stress: No Stress Concern Present (02/10/2021)   [employer] of Occupational Health - Occupational Stress Questionnaire    Feeling of Stress : Only a little  Social Connections: Socially  Isolated (02/10/2021)   Social Connection and Isolation Panel [NHANES]    Frequency of Communication with Friends and Family: More than three times a week    Frequency of Social Gatherings with Friends and Family: Twice a week    Attends Religious Services: Never    Database administrator or Organizations: No    Attends Banker Meetings: Never    Marital Status: Never married   Outpatient Encounter Medications as of 11/28/2023  Medication Sig   Cetirizine  HCl (ZYRTEC  ALLERGY PO) Take 1 tablet by mouth daily.   fluticasone (FLONASE) 50 MCG/ACT nasal spray Place into both nostrils daily.   Calcium Carbonate (CALCIUM 500 PO) Take 1 tablet by mouth 3 (three) times daily.   Multiple Vitamins-Minerals (BARIATRIC MULTIVITAMINS/IRON PO) Take 1 tablet by mouth daily.   [DISCONTINUED] amoxicillin -clavulanate (AUGMENTIN ) 875-125 MG tablet Take 1 tablet by mouth 2 (two) times daily.   [DISCONTINUED]  Loratadine  (CLARITIN  PO) Take by mouth.   [DISCONTINUED] progesterone  (PROMETRIUM ) 200 MG capsule Place 1 capsule in vagina at bedtime.   [DISCONTINUED] Semaglutide -Weight Management (WEGOVY ) 0.25 MG/0.5ML SOAJ Inject 0.25 mg into the skin once a week.   No facility-administered encounter medications on file as of 11/28/2023.   ALLERGIES: Allergies  Allergen Reactions   Ativan  [Lorazepam ] Other (See Comments)    Acute delirium.   Valium  [Diazepam ] Other (See Comments)    Acute delirium.    Ciprofloxacin      IV Only-Patient started sweating and becoming clammy when was infused with IV cipro  on 08/09/14. Can tolerate PO Ciprofloxacin      VACCINATION STATUS: Immunization History  Administered Date(s) Administered   Influenza,inj,Quad PF,6+ Mos 04/26/2019   Influenza-Unspecified 04/03/2020    HPI Paula King is 43 y.o. female who presents today to follow-up for stable right adrenal adenoma, goiter.  She underwent sleeve gastrectomy for weight management in August 2021.  She is here to follow-up on her right adrenal adenoma, goiter. She is still keeping off at least 30+ pounds of weight she has achieved subsequent to her surgery. She is trying to engage in some lifestyle measures to lose weight, however could not achieve significant weight loss recently.  She wishes to achieve some more weight loss.  During her last visit, she was given a prescription for Wegovy , however she did not start worrying about side effects.  - Her history starts in February 2016 when she underwent CT scan of her abdomen due to left lower quadrant pain which incidentally showed 2.7 cm mass  on her right adrenal gland which was reported to be consistent with adenoma. - Repeat CT abdomen in November 2018 revealed that the adenoma persisted and measured 3 cm ( HU 0) still favoring adenoma.  - March 24, 2019, she underwent CT angiogram related to work-up in the hospital when she was admitted for COVID-19, the  right adrenal adenoma was found to be measuring 3.2cm.  Her repeat previsit CT scan of abdomen reveals benign adrenal nodule, unchanged findings. 3.2 cm right adrenal nodule with -14 Hounsfield units consistent with adenoma. -She did not have recent abdominal imaging studies. -Previously ,she underwent hormonal work-up to determine functional status of this adenoma.    Findings have not been conclusive, except for one instance of 24-hour urine free cortisol marginally elevated at 59. -Her subsequent functional studies were within normal limits, did not require definitive intervention and was put on observation with plan to repeat plasma free metanephrines, A1c, a.m. cortisol, and thyroid  function tests.  These  are all within normal limits. She presents with normal plasma metanephrines and normal a.m. cortisol.   She is known to have euthyroid goiter, unchanged findings in thyroid  ultrasound in June 2023.  She presented with some dysphagia. - She is a mother of 1 child.   - She denies diaphoresis, uncontrolled hypertension, headaches.  Review of Systems  Limited as above.  Objective:    BP 106/74   Pulse 88   Ht 5\' 4"  (1.626 m)   Wt 218 lb 9.6 oz (99.2 kg)   BMI 37.52 kg/m   Wt Readings from Last 3 Encounters:  11/28/23 218 lb 9.6 oz (99.2 kg)  03/30/23 212 lb 12.8 oz (96.5 kg)  07/19/22 216 lb (98 kg)    Physical Exam   CMP ( most recent) CMP     Component Value Date/Time   NA 141 01/21/2023 0821   K 3.9 01/21/2023 0821   CL 104 01/21/2023 0821   CO2 22 01/21/2023 0821   GLUCOSE 88 01/21/2023 0821   GLUCOSE 113 (H) 12/09/2021 0449   BUN 10 01/21/2023 0821   CREATININE 0.99 01/21/2023 0821   CREATININE 0.98 07/18/2019 1505   CALCIUM 9.8 01/21/2023 0821   PROT 7.1 01/21/2023 0821   ALBUMIN 4.5 01/21/2023 0821   AST 19 01/21/2023 0821   ALT 24 01/21/2023 0821   ALKPHOS 67 01/21/2023 0821   BILITOT 0.4 01/21/2023 0821   GFRNONAA >60 12/09/2021 0449   GFRNONAA 64  04/18/2017 1041   GFRAA >60 02/07/2020 0457   GFRAA 74 04/18/2017 1041     Diabetic Labs (most recent): Lab Results  Component Value Date   HGBA1C 5.1 04/22/2023   HGBA1C 5.4 07/18/2019   HGBA1C 5.9 (H) 02/16/2019     Lipid Panel ( most recent) Lipid Panel     Component Value Date/Time   CHOL 148 01/21/2023 0821   TRIG 113 01/21/2023 0821   HDL 52 01/21/2023 0821   CHOLHDL 2.8 01/21/2023 0821   CHOLHDL 3.4 05/24/2018 1133   LDLCALC 76 01/21/2023 0821   LDLCALC 84 05/24/2018 1133       Lab Results  Component Value Date   TSH 0.770 04/22/2023   TSH 2.090 05/28/2022   TSH 1.635 12/07/2021   TSH 0.75 10/28/2021   TSH 1.350 05/25/2021   TSH 2.620 12/13/2018   TSH 1.83 04/18/2017   FREET4 1.16 05/28/2022   FREET4 1.06 12/13/2018      Most recent 24-hour urine free cortisol on November 28, 2017 was 14 improving from 59.  05/10/2017   CT abdomen  Radiology description of her right adrenal mass: Adrenal: Previously noted right adrenal mass is similar to the prior examination measuring 3.0 x 2.6 cm and is diffusely low-attenuation (0 HU), compatible with an adenoma. Left adrenal gland and bilateral kidneys are normal in appearance. No hydroureteronephrosis in the visualized portions of the abdomen.   March 24, 2019, 3.2 cm right adrenal adenoma redemonstrated.   Nov 09, 2023 CT adrenals showed 3.2 cm right adrenal nodule demonstrating Hounsfield units measurement of -14.  Left adrenal gland appears within normal limits.  This was considered consistent with an adenoma.  Assessment & Plan:   1. Adrenal adenoma, right  -3.2cm stable adrenal adenoma-with Hounsfield units of -14.    Radiologic features favoring benign adenoma. - a series of functional studies have been normal-unremarkable.  Patient has difficulty losing weight, see below.  Her most recent overnight dexamethasone  suppression test did not exclude Cushing syndrome.  She did have  normal plasma  metanephrines in 2019.   -Her previsit labs as well as her prior endocrine workup is negative for functional excess from this nodule.  She will be put on expectant management.  2) morbid obesity -She is status post laparoscopic sleeve gastrectomy which allowed her to lose 66 pounds until last visit, she has kept off 40+ pounds.  She has not started the GLP-1 receptor agonist prescribed for her during last visit due to concern for side effects.  She has had transaminases, which has resolved after her engagement with  lifestyle medicine. -She wishes to achieve some more weight loss.   - she acknowledges that there is a room for improvement in her food and drink choices. - Suggestion is made for her to avoid simple carbohydrates  from her diet including Cakes, Sweet Desserts, Ice Cream, Soda (diet and regular), Sweet Tea, Candies, Chips, Cookies, Store Bought Juices, Alcohol , Artificial Sweeteners,  Coffee Creamer, and "Sugar-free" Products, Lemonade. This will help patient to have more stable blood glucose profile and potentially avoid unintended weight gain.  The following Lifestyle Medicine recommendations according to American College of Lifestyle Medicine  [facility]) were discussed and and offered to patient and she  agrees to start the journey:  A. Whole Foods, Plant-Based Nutrition comprising of fruits and vegetables, plant-based proteins, whole-grain carbohydrates was discussed in detail with the patient.   A list for source of those nutrients were also provided to the patient.  Patient will use only water  or unsweetened tea for hydration. B.  The need to stay away from risky substances including alcohol, smoking; obtaining 7 to 9 hours of restorative sleep, at least 150 minutes of moderate intensity exercise weekly, the importance of healthy social connections,  and stress management techniques were discussed. C.  A full color page of  Calorie density of various food groups per pound showing  examples of each food groups was provided to the patient.    3. Goiter - She does not have family history of thyroid  cancer.  She continues to have stable thyroid  ultrasound, presents with some dysphagia.  She will be offered follow-up/surveillance thyroid  ultrasound. Her recent labs are consistent with euthyroid presentation.  She will not need initiation with thyroid  hormone or antithyroid intervention.     4.  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   - I advised patient to maintain close follow up with Pcp, No for primary care needs.   I spent  22  minutes in the care of the patient today including review of labs from Thyroid  Function, CMP, and other relevant labs ; imaging/biopsy records (current and previous including abstractions from other facilities); face-to-face time discussing  her lab results and symptoms, medications doses, her options of short and long term treatment based on the latest standards of care / guidelines;   and documenting the encounter.  Opal Bill Redfield  participated in the discussions, expressed understanding, and voiced agreement with the above plans.  All questions were answered to her satisfaction. she is encouraged to contact clinic should she have any questions or concerns prior to her return visit.   Follow up plan: Return in about 1 year (around 11/27/2024) for Fasting Labs  in AM B4 8, A1c -NV.   Kalvin Orf, MD [facility] [address] [place], [place] [address] Phone: [phone]  Fax: [phone]  11/28/2023, 6:50 PM  This note was partially dictated with voice recognition software. Similar sounding words can be transcribed inadequately or may not  be corrected upon review.

## 2024-11-28 ENCOUNTER — Ambulatory Visit: Admitting: "Endocrinology
